# Patient Record
Sex: Male | Born: 1955 | Race: Asian | Hispanic: No | Marital: Married | Smoking: Former smoker
Health system: Southern US, Community
[De-identification: ages and names within clinical notes are randomized; demographics above are authoritative.]

## PROBLEM LIST (undated history)

## (undated) MED FILL — Dexamethasone Sodium Phosphate Inj 100 MG/10ML: INTRAMUSCULAR | Qty: 1 | Status: AC

---

## 2004-10-16 ENCOUNTER — Emergency Department (HOSPITAL_COMMUNITY): Admission: EM | Admit: 2004-10-16 | Discharge: 2004-10-16 | Payer: Self-pay | Admitting: *Deleted

## 2018-04-28 ENCOUNTER — Ambulatory Visit: Payer: BLUE CROSS/BLUE SHIELD | Admitting: Urgent Care

## 2018-04-28 ENCOUNTER — Encounter: Payer: Self-pay | Admitting: Urgent Care

## 2018-04-28 VITALS — BP 143/80 | HR 70 | Temp 97.8°F | Resp 16 | Ht 62.0 in | Wt 136.6 lb

## 2018-04-28 DIAGNOSIS — Z130 Encounter for screening for diseases of the blood and blood-forming organs and certain disorders involving the immune mechanism: Secondary | ICD-10-CM | POA: Diagnosis not present

## 2018-04-28 DIAGNOSIS — Z23 Encounter for immunization: Secondary | ICD-10-CM

## 2018-04-28 DIAGNOSIS — Z0000 Encounter for general adult medical examination without abnormal findings: Secondary | ICD-10-CM | POA: Diagnosis not present

## 2018-04-28 DIAGNOSIS — Z1321 Encounter for screening for nutritional disorder: Secondary | ICD-10-CM

## 2018-04-28 DIAGNOSIS — Z114 Encounter for screening for human immunodeficiency virus [HIV]: Secondary | ICD-10-CM

## 2018-04-28 DIAGNOSIS — Z13228 Encounter for screening for other metabolic disorders: Secondary | ICD-10-CM

## 2018-04-28 DIAGNOSIS — Z122 Encounter for screening for malignant neoplasm of respiratory organs: Secondary | ICD-10-CM

## 2018-04-28 DIAGNOSIS — Z1329 Encounter for screening for other suspected endocrine disorder: Secondary | ICD-10-CM

## 2018-04-28 DIAGNOSIS — F17210 Nicotine dependence, cigarettes, uncomplicated: Secondary | ICD-10-CM

## 2018-04-28 DIAGNOSIS — Z1211 Encounter for screening for malignant neoplasm of colon: Secondary | ICD-10-CM

## 2018-04-28 DIAGNOSIS — H538 Other visual disturbances: Secondary | ICD-10-CM

## 2018-04-28 MED ORDER — NICOTINE 21 MG/24HR TD PT24: 21.0000 mg | MEDICATED_PATCH | Freq: Every day | TRANSDERMAL | 0 refills | Status: DC

## 2018-04-28 NOTE — Patient Instructions (Addendum)
Set a quit date to stop smoking. On that date, you stop smoking cigarettes and start using nicotine patches. Change the patches once daily. If you are able to, start cutting back on your smoking before your quit date then do so but for sure quit smoking cigarettes on your quit date.    Health Maintenance, Male A healthy lifestyle and preventive care is important for your health and wellness. Ask your health care provider about what schedule of regular examinations is right for you. What should I know about weight and diet? Eat a Healthy Diet  Eat plenty of vegetables, fruits, whole grains, low-fat dairy products, and lean protein.  Do not eat a lot of foods high in solid fats, added sugars, or salt.  Maintain a Healthy Weight Regular exercise can help you achieve or maintain a healthy weight. You should:  Do at least 150 minutes of exercise each week. The exercise should increase your heart rate and make you sweat (moderate-intensity exercise).  Do strength-training exercises at least twice a week.  Watch Your Levels of Cholesterol and Blood Lipids  Have your blood tested for lipids and cholesterol every 5 years starting at 64 years of age. If you are at high risk for heart disease, you should start having your blood tested when you are 64 years old. You may need to have your cholesterol levels checked more often if: ? Your lipid or cholesterol levels are high. ? You are older than 64 years of age. ? You are at high risk for heart disease.  What should I know about cancer screening? Many types of cancers can be detected early and may often be prevented. Lung Cancer  You should be screened every year for lung cancer if: ? You are a current smoker who has smoked for at least 30 years. ? You are a former smoker who has quit within the past 15 years.  Talk to your health care provider about your screening options, when you should start screening, and how often you should be  screened.  Colorectal Cancer  Routine colorectal cancer screening usually begins at 64 years of age and should be repeated every 5-10 years until you are 64 years old. You may need to be screened more often if early forms of precancerous polyps or small growths are found. Your health care provider may recommend screening at an earlier age if you have risk factors for colon cancer.  Your health care provider may recommend using home test kits to check for hidden blood in the stool.  A small camera at the end of a tube can be used to examine your colon (sigmoidoscopy or colonoscopy). This checks for the earliest forms of colorectal cancer.  Prostate and Testicular Cancer  Depending on your age and overall health, your health care provider may do certain tests to screen for prostate and testicular cancer.  Talk to your health care provider about any symptoms or concerns you have about testicular or prostate cancer.  Skin Cancer  Check your skin from head to toe regularly.  Tell your health care provider about any new moles or changes in moles, especially if: ? There is a change in a mole's size, shape, or color. ? You have a mole that is larger than a pencil eraser.  Always use sunscreen. Apply sunscreen liberally and repeat throughout the day.  Protect yourself by wearing long sleeves, pants, a wide-brimmed hat, and sunglasses when outside.  What should I know about heart disease, diabetes,  and high blood pressure?  If you are 1-69 years of age, have your blood pressure checked every 3-5 years. If you are 26 years of age or older, have your blood pressure checked every year. You should have your blood pressure measured twice-once when you are at a hospital or clinic, and once when you are not at a hospital or clinic. Record the average of the two measurements. To check your blood pressure when you are not at a hospital or clinic, you can use: ? An automated blood pressure machine at a  pharmacy. ? A home blood pressure monitor.  Talk to your health care provider about your target blood pressure.  If you are between 40-44 years old, ask your health care provider if you should take aspirin to prevent heart disease.  Have regular diabetes screenings by checking your fasting blood sugar level. ? If you are at a normal weight and have a low risk for diabetes, have this test once every three years after the age of 36. ? If you are overweight and have a high risk for diabetes, consider being tested at a younger age or more often.  A one-time screening for abdominal aortic aneurysm (AAA) by ultrasound is recommended for men aged 94-75 years who are current or former smokers. What should I know about preventing infection? Hepatitis B If you have a higher risk for hepatitis B, you should be screened for this virus. Talk with your health care provider to find out if you are at risk for hepatitis B infection. Hepatitis C Blood testing is recommended for:  Everyone born from [birth year] through 1967.  Anyone with known risk factors for hepatitis C.  Sexually Transmitted Diseases (STDs)  You should be screened each year for STDs including gonorrhea and chlamydia if: ? You are sexually active and are younger than 64 years of age. ? You are older than 64 years of age and your health care provider tells you that you are at risk for this type of infection. ? Your sexual activity has changed since you were last screened and you are at an increased risk for chlamydia or gonorrhea. Ask your health care provider if you are at risk.  Talk with your health care provider about whether you are at high risk of being infected with HIV. Your health care provider may recommend a prescription medicine to help prevent HIV infection.  What else can I do?  Schedule regular health, dental, and eye exams.  Stay current with your vaccines (immunizations).  Do not use any tobacco products, such as  cigarettes, chewing tobacco, and e-cigarettes. If you need help quitting, ask your health care provider.  Limit alcohol intake to no more than 2 drinks per day. One drink equals 12 ounces of beer, 5 ounces of wine, or 1 ounces of hard liquor.  Do not use street drugs.  Do not share needles.  Ask your health care provider for help if you need support or information about quitting drugs.  Tell your health care provider if you often feel depressed.  Tell your health care provider if you have ever been abused or do not feel safe at home. This information is not intended to replace advice given to you by your health care provider. Make sure you discuss any questions you have with your health care provider. Document Released: 03/25/2008 Document Revised: 05/26/2016 Document Reviewed: 07/01/2015 Elsevier Interactive Patient Education  2018 [place] soi ??i trng, Ng??i l?n  Colonoscopy, Adult N?i soi ??i trng l th?m khm ?? ki?m tra ton b? ru?t gi. Trong qu trnh th?m khm, m?t ?ng ???c bi tr?n, c th? u?n cong ???c ??a vo trong h?u mn v sau ? vo tr?c trng, ??i trng v cc ph?n khc c?a ru?t gi. N?i soi ??i trng th??ng ???c th?c hi?n nh? m?t ph?n c?a khm sng l?c ??i-tr?c k?t trng thng th??ng ho?c ?? ?ng ph v?i m?t s? tri?u ch?ng nh?t ??nh, ch?ng h?n nh? b?nh thi?u mu, tiu ch?y dai d?ng, ?au b?ng v mu trong phn. Th?m khm ny c th? gip sng l?c v ch?n ?on cc v?n ?? b?nh l, bao g?m:  Kh?i u.  Polip.  Vim.  Cc vng ch?y mu.  Hy cho chuyn gia ch?m [place] s?c kh?e bi?t v?:  B?t k? v?n ?? d? ?ng no m qu v? c.  T?t c? cc lo?i thu?c m qu v? ?ang s? d?ng, bao g?m c? vitamin, th?o d??c, thu?c nh? m?t, thu?c d?ng kem, thu?c khng k ??n.  B?t k? v?n ?? g m qu v? ho?c cc thnh vin trong gia ?nh ? g?p ph?i v?i thu?c gy m.  B?t k? r?i lo?n v? mu no m qu v? c.  B?t k? ph?u thu?t no qu v? ? ???c lm.  B?t k? tnh tr?ng b?nh  l no c?a qu v?.  B?t k? v?n ?? no m qu v? ? b? trong khi ??i ti?n. Cc nguy c? l g? Ni chung, ?y l m?t th? thu?t an ton. Tuy nhin, cc v?n ?? c th? x?y ra, bao g?m:  Ch?y mu.  V?t rch trong ru?t.  Ph?n ?ng v?i thu?c ???c cho s? d?ng trong lc th?m khm.  Nhi?m trng (hi?m g?p).  ?i?u g x?y ra tr??c khi lm th? thu?t? Nh?ng h?n ch? v? ?n v u?ng [place] th? ch? d?n c?a chuyn gia ch?m [place] s?c kh?e v? ?n v u?ng, c th? bao g?m:  M?t vi ngy tr??c khi ti?n hnh th? thu?t - tun theo ch? ?? ?n t ch?t x?. Trnh ?n qu? h?ch, cc lo?i h?t, tri cy s?y, tri cy s?ng v rau.  1-3 ngy tr??c ngy ti?n hnh th? thu?t - tun theo ch? ?? ?n ?? l?ng trong. Ch? u?ng cc ?? l?ng trong, ch?ng h?n nh? canh ho?c n??c canh th?t trong, tr ho?c c ph ?en, n??c p trong, n??c ng?t ho?c n??c u?ng th? thao trong, mn trng mi?ng ch?a gelatin v kem que. Trnh u?ng cc ch?t l?ng c ch?a ph?m mu ?? ho?c tm.  Vo ngy ti?n hnh th? thu?t - khng ?n hay u?ng b?t k? th? g trong vng 2 gi? tr??c khi ti?n hnh th? thu?t, ho?c trong kho?ng th?i gian m chuyn gia ch?m [place] s?c kh?e c?a qu v? ch? d?n.  Lm s?ch ru?t N?u qu v? ? ???c k ??n m?t lo?i thu?c x? qua ???ng u?ng ?? lm s?ch ??i trng:  Hy s?? du?ng theo ch? d?n c?a chuyn gia ch?m [place] s?c kh?e c?a qu v?. B?t ??u vo ngy tr??c khi lm th? thu?t, qu v? s? c?n u?ng m?t l??ng l?n d?ch l?ng pha thu?c. Ch?t l?ng ny s? lm qu v? ??i ti?n phn l?ng nhi?u l?n cho ??n khi phn g?n nh? trong ho?c c mu xanh l cy nh?t.  N?u da ho?c h?u mn c?a qu v? b? kch ?ng do tiu ch?y, qu v? c th? s? d?ng nh?ng th? sau ?? lm gi?m  kch ?ng: ? Kh?n lau t?m thu?c, ch?ng h?n nh? kh?n lau ??t c?a ng??i l?n c tinh ch?t l h?i v vitamin E. ? S?n ph?m lm d?u da nh? vaseline.  N?u qu v? b? nn trong khi u?ng thu?c x?, hy ngh? ng?i trong t?i ?a 60 pht v sau ? b?t ??u l?i vi?c lm s?ch ru?t. N?u qu v? ti?p t?c nn v khng th? u?ng thu?c x?  m khng b? nn, hy g?i cho chuyn gia ch?m [place] s?c kh?e c?a qu v?.  H??ng d?n chung  Hy h?i chuyn gia ch?m [place] s?c kh?e v? vi?c thay ??i ho?c d?ng cc lo?i thu?c dng th??ng xuyn c?a qu v?. ?i?u ny ??c bi?t quan tr?ng n?u qu v? ?ang dng thu?c tr? ti?u ???ng ho?c thu?c lm long mu.  C k? ho?ch nh? ai ? ??a quy? vi? t? b?nh vi?n ho?c t? phng khm v? nh. ?i?u g x?y ra trong qu trnh th?c hi?n th? thu?t?  Qu v? c th? ???c ??t m?t ???ng truy?n t?nh m?ch (IV) vo m?t trong cc t?nh m?ch.  Qu v? s? ???c cho dng thu?c ?? gip th? gin (thu?c an th?n).  ?? gi?m nguy c? nhi?m trng: ? ??i ng? nhn vin y t? s? r?a ho?c st trng tay c?a h?. ? Vng h?u mn c?a qu v? s? ???c r?a b?ng x phng.  Qu v? s? ???c yu c?u n?m nghing, hai ??u g?i g?p l?i.  Chuyn gia ch?m [place] s?c kh?e c?a qu v? s? bi tr?n m?t ?ng di, m?ng, m?m. ?ng s? ???c g?n camera v ?n ? ??u.  ?ng s? ???c ??a vo h?u mn c?a qu v?.  ?ng s? ???c nh? nhng ??a qua tr?c trng v ??i trng c?a qu v?.  Khng kh s? ???c b?m vo ??i trng c?a qu v? ?? gi? cho ??i trng m? r?ng. Qu v? c th? c?m th?y m?t cht p l?c ho?c co th?t.  Camera s? ???c s? d?ng ?? ch?p ?nh trong qu trnh ti?n hnh th? thu?t.  M?t m?u m nh? co? th? ????c l?y t?? c? th? c?a qu v? ?? ki?m tra d???i ki?nh hi?n vi (sinh thi?t). N?u pht hi?n th?y b?t k? v?n ?? ti?m tng no, m ny s? ???c g?i ??n phng th nghi?m ?? xt nghi?m.  N?u pht hi?n th?y cc polip nh?, chuyn gia ch?m [place] s?c kh?e c?a qu v? c th? l?y cc polip ? v mang ?i ki?m tra ?? xem c t? bo ung th? khng.  ?ng ??a vo h?u mn c?a qu v? s? ???c t? t? rt ra. Th? thu?t ny c th? khc nhau gi?a cc chuyn gia ch?m [place] s?c kh?e v cc b?nh vi?n. ?i?u g x?y ra sau khi lm th? thu?t?  Huy?t p, nh?p tim, nh?p th? v n?ng ?? oxi trong mu c?a qu v? s? ???c theo di cho ??n khi thu?c qu v? ? dng h?t tc d?ng.  Khng li xe trong vng 24 gi? sau khi  th?m khm.  Qu v? c th? c m?t l??ng mu nh? trong phn.  Qu v? c th? trung ti?n v b? co th?t ho?c ch??ng b?ng nh? do khng kh ? ???c s? d?ng ?? lm ph?ng ??i trng c?a qu v? trong lc th?m khm.  Qu v? ???c ty  l?y k?t qu? th? thu?t c?a mnh. Hy h?i chuyn gia ch?m [place] s?c kh?e ho?c khoa th?c hi?n thu? thu?t ?? bi?t khi no  c k?t qu? c?a qu v?. Thng tin ny khng nh?m m?c ?ch thay th? cho l?i khuyn m chuyn gia ch?m [place] s?c kh?e ni v?i qu v?. Hy b?o ??m qu v? ph?i th?o lu?n b?t k? v?n ?? g m qu v? c v?i chuyn gia ch?m [place] s?c kh?e c?a qu v?. Document Released: 07/07/2005 Document Revised: 09/09/2016 Document Reviewed: 12/09/2015 Elsevier Interactive Patient Education  2018 [employer].    Nicotine skin patches ?y l thu?c g? NICOTINE gip b? ht thu?c l. Cc mi?ng thu?c dn thay th? ch?t nicotine c trong thu?c l v gip gi?m cc ph?n ?ng cai thu?c (thi?u thu?c). Chng c tc d?ng nh?t khi ???c dng k?t h?p v?i m?t ch??ng trnh cai thu?c l. Thu?c ny c th? ???c dng cho nh?ng m?c ?ch khc; hy h?i ng??i cung c?p d?ch v? y t? ho?c d??c s? c?a mnh, n?u qu v? c th?c m?c. (CC) NHN HI?U PH? BI?N: Habitrol, Nicoderm CQ, Nicotrol Ti c?n ph?i bo cho ng??i cung c?p d?ch v? y t? c?a mnh ?i?u g tr??c khi dng thu?c ny? H? c?n bi?t li?u qu v? c b?t k? tnh tr?ng no sau ?y khng: -b?nh ti?u ???ng -b?nh tim, bao g?m ?au ng?c (ch?ng ?au th?t ng?c), suy tim, ho?c ? t?ng b? nh?i mu c? tim -nh?p tim khng ??u -huy?t a?p cao -b?nh ph?i, bao g?m b?nh hen suy?n -c??ng tuy?n gip tr?ng -u t? bo ?a chrome -co gi?t ho?c c ti?n s? co gi?t -b? cc v?n ?? v? da, nh? l chm da -cc v?n ?? v? bao t? ho?c lot bao t? -pha?n ??ng b?t th???ng ho??c di? ??ng v??i nicotine ho?c ch?t k?t dnh -pha?n ??ng b?t th???ng ho??c di? ??ng v??i ca?c d??c ph?m kha?c, th?c ph?m, thu?c nhu?m, ho??c ch?t ba?o qua?n -?ang c thai ho??c ??nh co? thai -?ang cho con  bu? Ti nn s? d?ng thu?c ny nh? th? no? Thu?c ny ?? dng trn da. Hy lm theo cc h??ng d?n trn h?p thu?c ho?c nhn thu?c. Tm vng da s?ch, kh, khng nh?n, khng b? t?n th??ng v khng c lng ? cnh tay trn, ng?c, ho?c l?ng. R?a tay b?ng x bng th??ng v n??c. Khng dng b?t k? th? g c ch?a l h?i, m? c?u (lanolin), ho?c glycerin, v nh?ng th? ny c th? lm cho mi?ng thu?c dn khng dnh. Lm kh hon ton. L?y mi?ng thu?c dn ra kh?i bao nim kn. Khng ???c c? g?ng c?t ho?c xn quanh mi?ng thu?c dn. Dng lng bn tay, ?n m?nh mi?ng thu?c dn vo ch? dn trong 10 giy ?? ch?c ch?n r?ng mi?ng thu?c ti?p xc t?t v?i da. Nn r?a tay sau khi dn mi?ng thu?c dn xong. Thay mi?ng thu?c dn hng ngy, duy tr m?t th?i bi?u ??u ??n. Khi dn mi?ng thu?c dn m?i, nn s? d?ng vng da ? ch? khc. Ch? t nh?t 1 tu?n tr??c khi dng l?i ch? dn c?. Hy bn v?i bc s? nhi khoa c?a qu v? v? vi?c dng thu?c ny ? tr? em. C th? c?n ch?m [place] ??c bi?t. Qu li?u: N?u qu v? cho r?ng mnh ? dng qu nhi?u thu?c ny, th hy lin l?c v?i trung tm ki?m sot ch?t ??c ho?c phng c?p c?u ngay l?p t?c. L?U : Thu?c ny ch? dnh ring cho qu v?. Khng chia s? thu?c ny v?i nh?ng ng??i khc. N?u ti l? qun m?t li?u th sao? N?u qu v? qun thay m?t mi?ng thu?c dn,  th hy dng n ngay khi c th?. M?i l?n ch? ???c dn m?t mi?ng thu?c dn v khng ???c ?? n l?u trn da lu h?n th?i gian ? ???c ch? d?n. N?u mi?ng thu?c dn b? r?t ra, th qu v? c th? thay n b?ng m?t mi?ng khc, nh?ng hy duy tr th?i bi?u dng thu?c c?a mnh v l?t b? mi?ng thu?c dn ?ng lc. Nh?ng g c th? t??ng tc v?i thu?c ny? -cc thu?c dng cho b?nh hen suy?n -cc thu?c dng ?? tr? huy?t p cao -cc thu?c dng cho ch?ng tr?m c?m [facility] sch ny c th? khng m t? ?? h?t cc t??ng tc c th? x?y ra. Hy ??a cho ng??i cung c?p d?ch v? y t? c?a mnh danh sch t?t c? cc thu?c, th?o d??c, cc thu?c khng c?n toa, ho?c cc ch? ph?m b? sung m  qu v? dng. C?ng nn bo cho h? bi?t r?ng qu v? c ht thu?c, u?ng r??u, ho?c c s? d?ng ma ty tri php hay khng. Vi th? c th? t??ng tc v?i thu?c c?a qu v?. Ti c?n ph?i theo di ?i?u g trong khi dng thu?c ny? Qu v? nn b?t ??u dng mi?ng dn nicotine vo ngy qu v? ng?ng ht thu?c l. N?u qu v? khng thnh cng trong n? l?c cai thu?c l v ht m?t ?i?u th c?ng khng sao. Qu v? v?n c th? ti?p t?c n? l?c cai thu?c l c?a mnh v ti?p t?c s? d?ng ch? ph?m nh? ? ???c ch? d?n. Ch? c?n v?t ?i cc ?i?u thu?c v tr? l?i v?i k? ho?ch cai thu?c l c?a mnh. Qu v? c th? gi? mi?ng thu?c dn t?i ch? trong khi b?i, khi t?m trong b?n ho?c t?m b?ng vi hoa sen. N?u mi?ng thu?c dn b? r?t ra trong cc ho?t ??ng ny, th hy thay mi?ng khc. Khi qu v? dn mi?ng thu?c l?n ??u tin, da qu v? c th? b? ng?a ho?c b? rt. V?n ?? ny s? s?m bi?n m?t. Khi qu v? l?t b? mi?ng thu?c, da c th? b? ??, nh?ng ?i?u ny ch? ko di vi ngy. Hy lin l?c v?i bc s? ho?c [place] vin y t? c?a mnh, n?u tnh tr?ng m?n ?? da khng h?t sau 4 ngy, ho?c n?u da b? s?ng, ho?c n?u qu v? b? n?i ban. N?u qu v? b? b?nh ti?u ???ng v qu v? b? ht thu?c l, th cc tc d?ng c?a insulin c th? t?ng ln v qu v? c th? s? c?n gi?m li?u dng c?a insulin. Hy lin l?c v?i bc s? ho?c chuyn vin y t? ?? bi?t cch ?i?u ch?nh li?u dng insulin c?a mnh. N?u qu v? s?p ch?p c?ng h??ng t? (magnetic resonance imaging - MRI), th hy bo cho k? thu?t vin MRI bi?t r?ng qu v? hi?n ?ang dn mi?ng thu?c ny trn ng??i. Ph?i l?t mi?ng thu?c tr??c khi ch?p cng h??ng t? (magnetic resonance imaging - MRI). Ti c th? nh?n th?y nh?ng tc d?ng ph? no khi dng thu?c ny? Nh?ng tc d?ng ph? qu v? c?n ph?i bo cho bc s? ho?c chuyn vin y t? cng s?m cng t?t: -cc ph?n ?ng d? ?ng, ch?ng h?n nh? da b? m?n ??, ng?a, n?i my ?ay, s?ng ? m?t, mi, ho?c l??i -kh th? -thay ??i thnh l?c -thay ??i th? l?c -?au ng?c -m? hi l?nh -l  l?n -tim ??p nhanh ho?c khng ??u -c?m th?y chong vng, ng?t x?u, b? t -?au ??u -t?ng  ti?t n??c mi?ng -tnh tr?ng ?? da ko di qu 4 ngy -?au b?ng -cc d?u hi?u v tri?u ch?ng b? qu li?u nicotine, nh? l b? bu?n i; i; chng m?t; y?u ?t; v c nh?p tim nhanh Cc tc d?ng ph? khng c?n ph?i ch?m [place] y t? (hy bo cho bc s? ho?c chuyn vin y t?, n?u cc tc d?ng ph? ny ti?p di?n ho?c gy phi?n toi): -tiu ch?y -kh mi?ng -n?c c?t -d? cu k?nh -c?ng th?ng ho?c b?n ch?n -kh ng? ho?c c nh?ng gi?c m? k? l? Danh sch ny c th? khng m t? ?? h?t cc tc d?ng ph? c th? x?y ra. Xin g?i t?i bc s? c?a mnh ?? ???c c? v?n chuyn mn v? cc tc d?ng ph?Sander Nephew v? c th? t??ng trnh cc tc d?ng ph? cho FDA theo s? [phone]. Ti nn c?t gi? thu?c c?a mnh ? ?u? ?? ngoi t?m tay tr? em. C?t gi? ? nhi?t ?? phng t? 20 ??n 25 ?? C (66 ??n 75 ?? F). Trnh nhi?t v nh sng. C?t gi? trong bao b c?a nh s?n xu?t cho ??n khi dng. V?t b? t?t c? thu?c ch?a dng sau ngy h?t h?n in trn nhn thu?c ho?c bao thu?c. Khi qu v? l?t mi?ng thu?c, hy g?p m?t dnh vo trong. B? vo trong ci bao khng v v?t b?. L?U : ?y l b?n tm t?t. N c th? khng bao hm t?t c? thng tin c th? c. N?u qu v? th?c m?c v? thu?c ny, xin trao ??i v?i bc s?, d??c s?, ho?c ng??i cung c?p d?ch v? y t? c?a mnh.  2018 Elsevier/Gold Standard (2016-10-28 00:00:00)      IF you received an x-ray today, you will receive an invoice from [facility]. Please contact [facility] at [phone] with questions or concerns regarding your invoice.   IF you received labwork today, you will receive an invoice from [place]. Please contact [facility] at [phone] with questions or concerns regarding your invoice.   Our billing staff will not be able to assist you with questions regarding bills from these companies.  You will be contacted with the lab results as soon as they are available. The fastest  way to get your results is to activate your My Chart account. Instructions are located on the last page of this paperwork. If you have not heard from [place] regarding the results in 2 weeks, please contact this office.

## 2018-04-28 NOTE — Progress Notes (Signed)
MRN: [id number]  Subjective:   [name] is a 62 y.o. male presenting for annual physical exam. Works as a Patent attorney. Patient is married, has 3 sons. Has good relationships at home, has a good support network. Has occasional drink of alcohol. Smokes 1ppd, has been smoking for 40+ years. Would like to discuss quitting smoking.    Medical care team includes: PCP: Patient, No Pcp Per Vision: Wears reading glasses occasionally. Has not had an eye exam since 2006. Dental: Has not gotten dental care recently due to very busy schedule.  Specialists: None. Health Maintenance: Needs to have tdap. Will be updated today. Schedule for colonoscopy. Start process of CT scan to screen for cancer given smoking history.   He is not currently taking any medications and has no known food or drug allergies.  Denies past medical and surgical history. Denies family history of cancer, diabetes, HTN, HL, heart disease, stroke, mental illness.   ROS  Objective:   Vitals: BP (!) 143/80   Pulse 70   Temp 97.8 F (36.6 C) (Oral)   Resp 16   Ht 5\' 2"  (1.575 m)   Wt 136 lb 9.6 oz (62 kg)   SpO2 97%   BMI 24.98 kg/m    Visual Acuity Screening   Right eye Left eye Both eyes  Without correction: 20/50-2 20/70-1 20/40-1  With correction:       Physical Exam  Constitutional: He is oriented to person, place, and time. He appears well-developed and well-nourished.  HENT:  TM's intact bilaterally, no effusions or erythema. Nasal turbinates pink and moist, nasal passages patent. No sinus tenderness. Oropharynx clear, mucous membranes moist, dentition in good repair.  Eyes: Pupils are equal, round, and reactive to light. Conjunctivae and EOM are normal. Right eye exhibits no discharge. Left eye exhibits no discharge. No scleral icterus.  Neck: Normal range of motion. Neck supple. No thyromegaly present.  Cardiovascular: Normal rate, regular rhythm and intact distal pulses. Exam reveals no  gallop and no friction rub.  No murmur heard. Pulmonary/Chest: No stridor. No respiratory distress. He has no wheezes. He has no rales.  Abdominal: Soft. Bowel sounds are normal. He exhibits no distension and no mass. There is no tenderness. There is no rebound and no guarding.  Musculoskeletal: Normal range of motion. He exhibits no edema or tenderness.  Lymphadenopathy:    He has no cervical adenopathy.  Neurological: He is alert and oriented to person, place, and time. He has normal reflexes. He displays normal reflexes. Coordination normal.  Skin: Skin is warm and dry. No rash noted. No erythema. No pallor.  Psychiatric: He has a normal mood and affect.   Assessment and Plan :   Annual physical exam - Plan: HIV antibody, CBC, Comprehensive metabolic panel, Lipid panel, TSH, Hepatitis C antibody  Screening for endocrine, nutritional, metabolic and immunity disorder  Screening for deficiency anemia  Screening for HIV (human immunodeficiency virus) - Plan: HIV antibody  Blurred vision - Plan: Ambulatory referral to Ophthalmology  Screen for colon cancer - Plan: Ambulatory referral to Gastroenterology  Encounter for screening for malignant neoplasm of respiratory organs - Plan: CANCELED: CT CHEST LUNG CA SCREEN LOW DOSE W/O CM  Smoking greater than 40 pack years - Plan: Ambulatory Referral for Lung Cancer Scre  Patient is medically stable, very pleasant.  Labs pending. Discussed healthy lifestyle, diet, exercise, preventative care, vaccinations, and addressed patient's concerns.  Referral to pulmonology for lung cancer screening and gastroenterology for  colon cancer screening is pending.  We discussed smoking cessation at length today, patient was agreeable to start nicotine patches.  Steps to quit smoking reviewed with patient.  He is to follow-up in 6 weeks for the same.  Jaynee Eagles, PA-C [facility] [phone] 04/28/2018  10:17 AM

## 2018-04-29 LAB — CBC
HEMATOCRIT: 48.3 % (ref 37.5–51.0)
HEMOGLOBIN: 16.5 g/dL (ref 13.0–17.7)
MCH: 31.5 pg (ref 26.6–33.0)
MCHC: 34.2 g/dL (ref 31.5–35.7)
MCV: 92 fL (ref 79–97)
Platelets: 224 10*3/uL (ref 150–450)
RBC: 5.24 x10E6/uL (ref 4.14–5.80)
RDW: 14.8 % (ref 12.3–15.4)
WBC: 6.4 10*3/uL (ref 3.4–10.8)

## 2018-04-29 LAB — COMPREHENSIVE METABOLIC PANEL
A/G RATIO: 1.8 (ref 1.2–2.2)
ALT: 39 IU/L (ref 0–44)
AST: 28 IU/L (ref 0–40)
Albumin: 4.4 g/dL (ref 3.6–4.8)
Alkaline Phosphatase: 134 IU/L — ABNORMAL HIGH (ref 39–117)
BUN/Creatinine Ratio: 16 (ref 10–24)
BUN: 13 mg/dL (ref 8–27)
Bilirubin Total: 0.2 mg/dL (ref 0.0–1.2)
CALCIUM: 8.9 mg/dL (ref 8.6–10.2)
CO2: 21 mmol/L (ref 20–29)
CREATININE: 0.79 mg/dL (ref 0.76–1.27)
Chloride: 106 mmol/L (ref 96–106)
GFR calc Af Amer: 110 mL/min/{1.73_m2} (ref >59)
GFR, EST NON AFRICAN AMERICAN: 96 mL/min/{1.73_m2} (ref >59)
GLOBULIN, TOTAL: 2.4 g/dL (ref 1.5–4.5)
Glucose: 95 mg/dL (ref 65–99)
POTASSIUM: 4.3 mmol/L (ref 3.5–5.2)
SODIUM: 145 mmol/L — AB (ref 134–144)
Total Protein: 6.8 g/dL (ref 6.0–8.5)

## 2018-04-29 LAB — LIPID PANEL
CHOL/HDL RATIO: 6.7 ratio — AB (ref 0.0–5.0)
Cholesterol, Total: 268 mg/dL — ABNORMAL HIGH (ref 100–199)
HDL: 40 mg/dL (ref >39)
LDL CALC: 188 mg/dL — AB (ref 0–99)
TRIGLYCERIDES: 200 mg/dL — AB (ref 0–149)
VLDL Cholesterol Cal: 40 mg/dL (ref 5–40)

## 2018-04-29 LAB — HEPATITIS C ANTIBODY: Hep C Virus Ab: <0.1 s/co ratio (ref 0.0–0.9)

## 2018-04-29 LAB — HIV ANTIBODY (ROUTINE TESTING W REFLEX): HIV SCREEN 4TH GENERATION: NONREACTIVE

## 2018-04-29 LAB — TSH: TSH: 1.85 u[IU]/mL (ref 0.450–4.500)

## 2018-05-02 ENCOUNTER — Encounter: Payer: Self-pay | Admitting: Urgent Care

## 2018-05-19 ENCOUNTER — Telehealth: Payer: Self-pay | Admitting: Urgent Care

## 2018-05-19 NOTE — Telephone Encounter (Signed)
Called pt to let them know we would need to reschedule their appt 06/13/18. Pt's number did not work, no DPR on file.

## 2018-06-07 ENCOUNTER — Encounter: Payer: Self-pay | Admitting: *Deleted

## 2018-06-13 ENCOUNTER — Ambulatory Visit: Payer: BLUE CROSS/BLUE SHIELD | Admitting: Urgent Care

## 2018-06-14 ENCOUNTER — Ambulatory Visit: Payer: BLUE CROSS/BLUE SHIELD | Admitting: Urgent Care

## 2018-07-14 ENCOUNTER — Encounter: Payer: Self-pay | Admitting: Urgent Care

## 2018-08-22 ENCOUNTER — Emergency Department (HOSPITAL_COMMUNITY)
Admission: EM | Admit: 2018-08-22 | Discharge: 2018-08-22 | Disposition: A | Discharge status: Home / Self Care | Payer: BLUE CROSS/BLUE SHIELD | Attending: Emergency Medicine | Admitting: Emergency Medicine

## 2018-08-22 ENCOUNTER — Encounter (HOSPITAL_COMMUNITY): Payer: Self-pay

## 2018-08-22 ENCOUNTER — Emergency Department (HOSPITAL_COMMUNITY): Payer: BLUE CROSS/BLUE SHIELD

## 2018-08-22 ENCOUNTER — Other Ambulatory Visit: Payer: Self-pay

## 2018-08-22 DIAGNOSIS — N200 Calculus of kidney: Secondary | ICD-10-CM | POA: Insufficient documentation

## 2018-08-22 DIAGNOSIS — R109 Unspecified abdominal pain: Secondary | ICD-10-CM | POA: Diagnosis present

## 2018-08-22 DIAGNOSIS — F17210 Nicotine dependence, cigarettes, uncomplicated: Secondary | ICD-10-CM | POA: Diagnosis not present

## 2018-08-22 LAB — BASIC METABOLIC PANEL
ANION GAP: 8 (ref 5–15)
BUN: 18 mg/dL (ref 8–23)
CALCIUM: 8.8 mg/dL — AB (ref 8.9–10.3)
CO2: 24 mmol/L (ref 22–32)
Chloride: 108 mmol/L (ref 98–111)
Creatinine, Ser: 0.94 mg/dL (ref 0.61–1.24)
Glucose, Bld: 102 mg/dL — ABNORMAL HIGH (ref 70–99)
Potassium: 3.8 mmol/L (ref 3.5–5.1)
Sodium: 140 mmol/L (ref 135–145)

## 2018-08-22 LAB — CBC
HCT: 48.4 % (ref 39.0–52.0)
Hemoglobin: 15.8 g/dL (ref 13.0–17.0)
MCH: 31.8 pg (ref 26.0–34.0)
MCHC: 32.6 g/dL (ref 30.0–36.0)
MCV: 97.4 fL (ref 80.0–100.0)
NRBC: 0 % (ref 0.0–0.2)
PLATELETS: 206 10*3/uL (ref 150–400)
RBC: 4.97 MIL/uL (ref 4.22–5.81)
RDW: 14.6 % (ref 11.5–15.5)
WBC: 11.3 10*3/uL — ABNORMAL HIGH (ref 4.0–10.5)

## 2018-08-22 LAB — URINALYSIS, ROUTINE W REFLEX MICROSCOPIC
Bilirubin Urine: NEGATIVE
Glucose, UA: NEGATIVE mg/dL
KETONES UR: NEGATIVE mg/dL
LEUKOCYTES UA: NEGATIVE
NITRITE: NEGATIVE
PROTEIN: NEGATIVE mg/dL
Specific Gravity, Urine: 1.003 — ABNORMAL LOW (ref 1.005–1.030)
pH: 6 (ref 5.0–8.0)

## 2018-08-22 MED ORDER — ONDANSETRON HCL 4 MG/2ML IJ SOLN
4.0000 mg | Freq: Once | INTRAMUSCULAR | Status: AC
  Administered 2018-08-22: 4 mg via INTRAVENOUS
  Filled 2018-08-22: qty 2

## 2018-08-22 MED ORDER — KETOROLAC TROMETHAMINE 15 MG/ML IJ SOLN
15.0000 mg | Freq: Once | INTRAMUSCULAR | Status: AC
  Administered 2018-08-22: 15 mg via INTRAVENOUS
  Filled 2018-08-22: qty 1

## 2018-08-22 MED ORDER — OXYCODONE-ACETAMINOPHEN 5-325 MG PO TABS: 1.0000 | ORAL_TABLET | ORAL | 0 refills | Status: DC | PRN

## 2018-08-22 MED ORDER — IBUPROFEN 600 MG PO TABS: 600.0000 mg | ORAL_TABLET | Freq: Four times a day (QID) | ORAL | 0 refills | Status: DC | PRN

## 2018-08-22 MED ORDER — SODIUM CHLORIDE 0.9 % IV BOLUS
1000.0000 mL | Freq: Once | INTRAVENOUS | Status: AC
  Administered 2018-08-22: 1000 mL via INTRAVENOUS

## 2018-08-22 MED ORDER — MORPHINE SULFATE (PF) 4 MG/ML IV SOLN
4.0000 mg | Freq: Once | INTRAVENOUS | Status: AC
  Administered 2018-08-22: 4 mg via INTRAVENOUS
  Filled 2018-08-22: qty 1

## 2018-08-22 MED ORDER — OXYCODONE-ACETAMINOPHEN 5-325 MG PO TABS
2.0000 | ORAL_TABLET | Freq: Once | ORAL | Status: AC
  Administered 2018-08-22: 2 via ORAL
  Filled 2018-08-22: qty 2

## 2018-08-22 MED ORDER — ONDANSETRON 4 MG PO TBDP: 4.0000 mg | ORAL_TABLET | Freq: Three times a day (TID) | ORAL | 0 refills | Status: DC | PRN

## 2018-08-22 NOTE — Discharge Instructions (Addendum)
It is VERY important to follow-up with Urology for your CT scans, even if your symptoms resolve

## 2018-08-22 NOTE — ED Notes (Signed)
Patient given water and crackers for PO Challenge, per RN

## 2018-08-22 NOTE — ED Notes (Signed)
MD at bedside. 

## 2018-08-22 NOTE — ED Notes (Addendum)
Patients son states patient is ready to go home. MD, Ellender Hose made aware.

## 2018-08-22 NOTE — ED Notes (Signed)
Patient given saltine crackers and water for PO challenge 

## 2018-08-22 NOTE — ED Triage Notes (Addendum)
Pt arrives POV from home. Pt reports hematuria and left flank pain for 3 days. Pt reports the pain extends into abd.

## 2018-08-22 NOTE — ED Provider Notes (Addendum)
[facility] DEPT Provider Note   CSN: [id number] Arrival date & time: 08/22/18  0845     History   Chief Complaint Chief Complaint  Patient presents with  . Hematuria  . Flank Pain    HPI Jason Lyons is a 62 y.o. male.  HPI   62 year old male here with left flank pain.  Patient states that 3 days ago, his pain began as acute onset of aching, gnawing, left flank pain.  Since then, he has had intermittent hematuria as well as colicky left flank pain.  The pain is worse with certain positions.  No alleviating factors.  It seems to come and go randomly.  Denies any dysuria.  No fever chills.  He had nausea at the onset of pain, but has not had any nausea or vomiting since then.  Is been able to eat and drink without difficulty.  Denies any history of kidney stones.  No diarrhea or change in bowel habits.  History reviewed. No pertinent past medical history.  There are no active problems to display for this patient.   History reviewed. No pertinent surgical history.      Home Medications    Prior to Admission medications   Medication Sig Start Date End Date Taking? Authorizing Provider  ibuprofen (ADVIL,MOTRIN) 600 MG tablet Take 1 tablet (600 mg total) by mouth every 6 (six) hours as needed for moderate pain. 08/22/18   Duffy Bruce, MD  nicotine (NICODERM CQ) 21 mg/24hr patch Place 1 patch (21 mg total) onto the skin daily. Patient not taking: Reported on 08/22/2018 04/28/18   Jaynee Eagles, PA-C  ondansetron (ZOFRAN ODT) 4 MG disintegrating tablet Take 1 tablet (4 mg total) by mouth every 8 (eight) hours as needed for nausea or vomiting. 08/22/18   Duffy Bruce, MD  oxyCODONE-acetaminophen (PERCOCET/ROXICET) 5-325 MG tablet Take 1-2 tablets by mouth every 4 (four) hours as needed for moderate pain or severe pain. 08/22/18   Duffy Bruce, MD    Family History History reviewed. No pertinent family history.  Social History Social History    Tobacco Use  . Smoking status: Current Every Day Smoker  . Smokeless tobacco: Never Used  Substance Use Topics  . Alcohol use: Yes    Comment: occ  . Drug use: Never     Allergies   Patient has no known allergies.   Review of Systems Review of Systems  Constitutional: Negative for chills, fatigue and fever.  HENT: Negative for congestion and rhinorrhea.   Eyes: Negative for visual disturbance.  Respiratory: Negative for cough, shortness of breath and wheezing.   Cardiovascular: Negative for chest pain and leg swelling.  Gastrointestinal: Positive for nausea. Negative for abdominal pain, diarrhea and vomiting.  Genitourinary: Positive for flank pain and hematuria. Negative for dysuria.  Musculoskeletal: Negative for neck pain and neck stiffness.  Skin: Negative for rash and wound.  Allergic/Immunologic: Negative for immunocompromised state.  Neurological: Negative for syncope, weakness and headaches.  All other systems reviewed and are negative.    Physical Exam Updated Vital Signs BP 124/72   Pulse (!) 55   Temp 98.3 F (36.8 C) (Oral)   Resp 16   Wt 63.5 kg   SpO2 99%   BMI 25.61 kg/m   Physical Exam  Constitutional: He is oriented to person, place, and time. He appears well-developed and well-nourished. No distress.  HENT:  Head: Normocephalic and atraumatic.  Eyes: Conjunctivae are normal.  Neck: Neck supple.  Cardiovascular: Normal rate, regular rhythm  and normal heart sounds. Exam reveals no friction rub.  No murmur heard. Pulmonary/Chest: Effort normal and breath sounds normal. No respiratory distress. He has no wheezes. He has no rales.  Abdominal: Soft. Bowel sounds are normal. He exhibits no distension. There is no rebound and no guarding.  Mild left flank and upper quadrant tenderness.  No rebound or guarding.  Musculoskeletal: He exhibits no edema.  Neurological: He is alert and oriented to person, place, and time. He exhibits normal muscle tone.    Skin: Skin is warm. Capillary refill takes less than 2 seconds.  Psychiatric: He has a normal mood and affect.  Nursing note and vitals reviewed.    ED Treatments / Results  Labs (all labs ordered are listed, but only abnormal results are displayed) Labs Reviewed  URINALYSIS, ROUTINE W REFLEX MICROSCOPIC - Abnormal; Notable for the following components:      Result Value   Specific Gravity, Urine 1.003 (*)    Hgb urine dipstick LARGE (*)    Bacteria, UA FEW (*)    All other components within normal limits  BASIC METABOLIC PANEL - Abnormal; Notable for the following components:   Glucose, Bld 102 (*)    Calcium 8.8 (*)    All other components within normal limits  CBC - Abnormal; Notable for the following components:   WBC 11.3 (*)    All other components within normal limits    EKG None  Radiology Ct Renal Stone Study  Result Date: 08/22/2018 CLINICAL DATA:  62 year old male with left flank pain for 3 days. Hematuria. Initial encounter. EXAM: CT ABDOMEN AND PELVIS WITHOUT CONTRAST TECHNIQUE: Multidetector CT imaging of the abdomen and pelvis was performed following the standard protocol without IV contrast. COMPARISON:  None. FINDINGS: Lower chest: Dependent atelectasis. Focal fatty infiltration apex left ventricle may be related to prior infarct. Heart slightly enlarged. Minimal right coronary artery calcification. Hepatobiliary: Abnormal appearance inferior aspect right lobe liver raises possibility of underlying mass. Gallbladder sludge without calcified gallstone. Pancreas: Taking into account limitation by non contrast imaging, no worrisome pancreatic mass or inflammation. Spleen: Taking into account limitation by non contrast imaging, no splenic mass or enlargement. Adrenals/Urinary Tract: 3 x 3 x 8 mm obstructing stone at the level left ureterovesical junction is causing moderate left hydroureteronephrosis. At the level of the left ureteral vesical junction, there is soft  tissue prominence which in the present setting may represent edema related to the obstructing stone. Primary bladder mass is a secondary consideration. Slight haziness of left pararenal fat planes probably related to the obstruction rather than pyelonephritis. Tiny left lower pole nonobstructing renal calculi. Taking into account limitation by non contrast imaging, no worrisome renal or adrenal lesion. Stomach/Bowel: No extraluminal bowel inflammatory process. Specifically, no inflammation surrounds the appendix. Portions of the stomach, small bowel and colon are under distended limiting evaluation. Vascular/Lymphatic: Atherosclerotic changes aorta iliac arteries and femoral arteries. No abdominal aortic aneurysm. Scattered normal size lymph nodes. Reproductive: Slightly lobulated prostate gland with prominent calcifications on the left. Minimal impression upon the bladder base. Other: No free air or bowel containing hernia. Musculoskeletal: Minimal sclerotic focus right femoral neck and ilium bilaterally. IMPRESSION: 1. 3 x 3 x 8 mm obstructing stone at the level left ureterovesical junction is causing moderate left hydroureteronephrosis. At the level of the left ureteral vesical junction, there is soft tissue prominence which in the present setting may represent edema related to the obstructing stone. Primary bladder mass is a secondary consideration. 2. Slight haziness  of left pararenal fat planes probably related to the obstruction rather than pyelonephritis. Tiny nonobstructing left lower pole renal calculus. 3. Hypodensity inferior aspect right lobe liver. Lack of contrast and motion limited evaluation. Cannot exclude a liver mass. Dedicated liver imaging, whether by CT or MR may be considered for further delineation. 4. Focal fatty infiltration apex left ventricle may be related to prior infarct. Heart slightly enlarged. Minimal right coronary artery calcification. 5.  Aortic Atherosclerosis (ICD10-I70.0). 6.  Slightly lobulated prostate gland with prominent calcifications on the left. Minimal impression upon the bladder base. Electronically Signed   By: Genia Del M.D.   On: 08/22/2018 10:25    Procedures Procedures (including critical care time)  Medications Ordered in ED Medications  sodium chloride 0.9 % bolus 1,000 mL (0 mLs Intravenous Stopped 08/22/18 1101)  morphine 4 MG/ML injection 4 mg (4 mg Intravenous Given 08/22/18 1001)  ondansetron (ZOFRAN) injection 4 mg (4 mg Intravenous Given 08/22/18 1001)  ketorolac (TORADOL) 15 MG/ML injection 15 mg (15 mg Intravenous Given 08/22/18 1123)  oxyCODONE-acetaminophen (PERCOCET/ROXICET) 5-325 MG per tablet 2 tablet (2 tablets Oral Given 08/22/18 1301)  ondansetron (ZOFRAN) injection 4 mg (4 mg Intravenous Given 08/22/18 1301)     Initial Impression / Assessment and Plan / ED Course  I have reviewed the triage vital signs and the nursing notes.  Pertinent labs & imaging results that were available during my care of the patient were reviewed by me and considered in my medical decision making (see chart for details).     61 yo F with PMHx here with L flank pain and hematuria. Labs, imaging is c/w 3x3x8 mm obstructing stone. No evidence of infection on UA and pt denies any fever. He is not tachycardic. Doubt infected stone. Mild leukocytosis is likely reactive. Renal function wnl. Pain markedly improved with fluids, analgesia here. Of note, pt has possible bladder mass, also findings in liver, heart on CT. These are likely incidental but were discussed with pt, w/ interpreter, and he will f/u with PCP. Importance of close f/u with Urology discussed. Given that stone is already at UVJ, feel risks of flomax with BP 110-120s here outweigh benefits. D/c with good return precautions.  Final Clinical Impressions(s) / ED Diagnoses   Final diagnoses:  Renal stone    ED Discharge Orders         Ordered    oxyCODONE-acetaminophen (PERCOCET/ROXICET)  5-325 MG tablet  Every 4 hours PRN     08/22/18 1336    ondansetron (ZOFRAN ODT) 4 MG disintegrating tablet  Every 8 hours PRN     08/22/18 1336    ibuprofen (ADVIL,MOTRIN) 600 MG tablet  Every 6 hours PRN     08/22/18 1336           Duffy Bruce, MD 08/22/18 1547    Duffy Bruce, MD 08/22/18 1549

## 2021-05-11 DIAGNOSIS — C7951 Secondary malignant neoplasm of bone: Secondary | ICD-10-CM | POA: Insufficient documentation

## 2021-05-11 DIAGNOSIS — R7989 Other specified abnormal findings of blood chemistry: Secondary | ICD-10-CM | POA: Insufficient documentation

## 2021-05-11 DIAGNOSIS — Z86711 Personal history of pulmonary embolism: Secondary | ICD-10-CM | POA: Insufficient documentation

## 2021-05-11 DIAGNOSIS — K769 Liver disease, unspecified: Secondary | ICD-10-CM | POA: Insufficient documentation

## 2021-05-11 DIAGNOSIS — Z79899 Other long term (current) drug therapy: Secondary | ICD-10-CM | POA: Insufficient documentation

## 2021-05-11 DIAGNOSIS — C249 Malignant neoplasm of biliary tract, unspecified: Secondary | ICD-10-CM | POA: Insufficient documentation

## 2021-05-11 DIAGNOSIS — Z5111 Encounter for antineoplastic chemotherapy: Secondary | ICD-10-CM | POA: Insufficient documentation

## 2021-05-11 DIAGNOSIS — Z7952 Long term (current) use of systemic steroids: Secondary | ICD-10-CM | POA: Insufficient documentation

## 2021-05-11 DIAGNOSIS — Z5112 Encounter for antineoplastic immunotherapy: Secondary | ICD-10-CM | POA: Insufficient documentation

## 2021-05-11 DIAGNOSIS — Z7901 Long term (current) use of anticoagulants: Secondary | ICD-10-CM | POA: Insufficient documentation

## 2021-05-11 DIAGNOSIS — Z510 Encounter for antineoplastic radiation therapy: Secondary | ICD-10-CM | POA: Insufficient documentation

## 2021-05-11 DIAGNOSIS — M8458XA Pathological fracture in neoplastic disease, other specified site, initial encounter for fracture: Secondary | ICD-10-CM | POA: Insufficient documentation

## 2021-05-11 DIAGNOSIS — Z87891 Personal history of nicotine dependence: Secondary | ICD-10-CM | POA: Insufficient documentation

## 2021-05-11 DIAGNOSIS — I700 Atherosclerosis of aorta: Secondary | ICD-10-CM | POA: Insufficient documentation

## 2021-05-11 DIAGNOSIS — I2693 Single subsegmental pulmonary embolism without acute cor pulmonale: Secondary | ICD-10-CM | POA: Insufficient documentation

## 2021-05-11 DIAGNOSIS — N400 Enlarged prostate without lower urinary tract symptoms: Secondary | ICD-10-CM | POA: Insufficient documentation

## 2021-05-13 ENCOUNTER — Emergency Department (HOSPITAL_BASED_OUTPATIENT_CLINIC_OR_DEPARTMENT_OTHER): Payer: Managed Care, Other (non HMO)

## 2021-05-13 ENCOUNTER — Other Ambulatory Visit: Payer: Self-pay

## 2021-05-13 ENCOUNTER — Encounter (HOSPITAL_BASED_OUTPATIENT_CLINIC_OR_DEPARTMENT_OTHER): Payer: Self-pay | Admitting: Obstetrics and Gynecology

## 2021-05-13 ENCOUNTER — Inpatient Hospital Stay (HOSPITAL_BASED_OUTPATIENT_CLINIC_OR_DEPARTMENT_OTHER)
Admission: EM | Admit: 2021-05-13 | Discharge: 2021-05-19 | DRG: 423 | Disposition: A | Discharge status: Home / Self Care | Payer: Managed Care, Other (non HMO) | Attending: Student | Admitting: Student

## 2021-05-13 ENCOUNTER — Emergency Department (HOSPITAL_BASED_OUTPATIENT_CLINIC_OR_DEPARTMENT_OTHER): Payer: Managed Care, Other (non HMO) | Admitting: Radiology

## 2021-05-13 DIAGNOSIS — I2699 Other pulmonary embolism without acute cor pulmonale: Secondary | ICD-10-CM | POA: Diagnosis present

## 2021-05-13 DIAGNOSIS — C7951 Secondary malignant neoplasm of bone: Secondary | ICD-10-CM | POA: Diagnosis present

## 2021-05-13 DIAGNOSIS — R160 Hepatomegaly, not elsewhere classified: Secondary | ICD-10-CM | POA: Diagnosis not present

## 2021-05-13 DIAGNOSIS — M8458XA Pathological fracture in neoplastic disease, other specified site, initial encounter for fracture: Secondary | ICD-10-CM | POA: Diagnosis not present

## 2021-05-13 DIAGNOSIS — M8448XA Pathological fracture, other site, initial encounter for fracture: Secondary | ICD-10-CM | POA: Diagnosis present

## 2021-05-13 DIAGNOSIS — Z20822 Contact with and (suspected) exposure to covid-19: Secondary | ICD-10-CM | POA: Diagnosis present

## 2021-05-13 DIAGNOSIS — R937 Abnormal findings on diagnostic imaging of other parts of musculoskeletal system: Secondary | ICD-10-CM | POA: Diagnosis not present

## 2021-05-13 DIAGNOSIS — F17210 Nicotine dependence, cigarettes, uncomplicated: Secondary | ICD-10-CM | POA: Diagnosis present

## 2021-05-13 DIAGNOSIS — R7989 Other specified abnormal findings of blood chemistry: Secondary | ICD-10-CM | POA: Diagnosis present

## 2021-05-13 DIAGNOSIS — K5909 Other constipation: Secondary | ICD-10-CM | POA: Diagnosis present

## 2021-05-13 DIAGNOSIS — M48061 Spinal stenosis, lumbar region without neurogenic claudication: Secondary | ICD-10-CM | POA: Diagnosis present

## 2021-05-13 DIAGNOSIS — C787 Secondary malignant neoplasm of liver and intrahepatic bile duct: Secondary | ICD-10-CM | POA: Diagnosis not present

## 2021-05-13 DIAGNOSIS — C801 Malignant (primary) neoplasm, unspecified: Secondary | ICD-10-CM | POA: Diagnosis present

## 2021-05-13 DIAGNOSIS — G893 Neoplasm related pain (acute) (chronic): Secondary | ICD-10-CM | POA: Diagnosis present

## 2021-05-13 DIAGNOSIS — K5900 Constipation, unspecified: Secondary | ICD-10-CM | POA: Diagnosis present

## 2021-05-13 DIAGNOSIS — F17200 Nicotine dependence, unspecified, uncomplicated: Secondary | ICD-10-CM | POA: Diagnosis not present

## 2021-05-13 DIAGNOSIS — Z789 Other specified health status: Secondary | ICD-10-CM | POA: Diagnosis not present

## 2021-05-13 DIAGNOSIS — R079 Chest pain, unspecified: Secondary | ICD-10-CM | POA: Diagnosis not present

## 2021-05-13 DIAGNOSIS — I2693 Single subsegmental pulmonary embolism without acute cor pulmonale: Secondary | ICD-10-CM | POA: Diagnosis present

## 2021-05-13 LAB — CBC
HCT: 46.3 % (ref 39.0–52.0)
Hemoglobin: 15.8 g/dL (ref 13.0–17.0)
MCH: 31.3 pg (ref 26.0–34.0)
MCHC: 34.1 g/dL (ref 30.0–36.0)
MCV: 91.9 fL (ref 80.0–100.0)
Platelets: 223 10*3/uL (ref 150–400)
RBC: 5.04 MIL/uL (ref 4.22–5.81)
RDW: 15 % (ref 11.5–15.5)
WBC: 6.4 10*3/uL (ref 4.0–10.5)
nRBC: 0 % (ref 0.0–0.2)

## 2021-05-13 LAB — BASIC METABOLIC PANEL
Anion gap: 10 (ref 5–15)
BUN: 14 mg/dL (ref 8–23)
CO2: 25 mmol/L (ref 22–32)
Calcium: 9.2 mg/dL (ref 8.9–10.3)
Chloride: 107 mmol/L (ref 98–111)
Creatinine, Ser: 0.66 mg/dL (ref 0.61–1.24)
GFR, Estimated: >60 mL/min (ref >60)
Glucose, Bld: 90 mg/dL (ref 70–99)
Potassium: 4.2 mmol/L (ref 3.5–5.1)
Sodium: 142 mmol/L (ref 135–145)

## 2021-05-13 LAB — RESP PANEL BY RT-PCR (FLU A&B, COVID) ARPGX2
Influenza A by PCR: NEGATIVE
Influenza B by PCR: NEGATIVE
SARS Coronavirus 2 by RT PCR: NEGATIVE

## 2021-05-13 LAB — HEPATIC FUNCTION PANEL
ALT: 85 U/L — ABNORMAL HIGH (ref 0–44)
AST: 61 U/L — ABNORMAL HIGH (ref 15–41)
Albumin: 4.1 g/dL (ref 3.5–5.0)
Alkaline Phosphatase: 578 U/L — ABNORMAL HIGH (ref 38–126)
Bilirubin, Direct: 0.1 mg/dL (ref 0.0–0.2)
Indirect Bilirubin: 0.8 mg/dL (ref 0.3–0.9)
Total Bilirubin: 0.9 mg/dL (ref 0.3–1.2)
Total Protein: 7.1 g/dL (ref 6.5–8.1)

## 2021-05-13 LAB — D-DIMER, QUANTITATIVE: D-Dimer, Quant: 1.13 ug/mL-FEU — ABNORMAL HIGH (ref 0.00–0.50)

## 2021-05-13 LAB — HEPARIN LEVEL (UNFRACTIONATED): Heparin Unfractionated: 0.32 IU/mL (ref 0.30–0.70)

## 2021-05-13 LAB — TROPONIN I (HIGH SENSITIVITY)
Troponin I (High Sensitivity): 6 ng/L (ref <18)
Troponin I (High Sensitivity): 6 ng/L (ref <18)

## 2021-05-13 MED ORDER — HEPARIN BOLUS VIA INFUSION
3600.0000 [IU] | Freq: Once | INTRAVENOUS | Status: AC
  Administered 2021-05-13: 3600 [IU] via INTRAVENOUS

## 2021-05-13 MED ORDER — MORPHINE SULFATE (PF) 4 MG/ML IV SOLN
4.0000 mg | INTRAVENOUS | Status: AC | PRN
  Administered 2021-05-13 – 2021-05-14 (×3): 4 mg via INTRAVENOUS
  Filled 2021-05-13 (×3): qty 1

## 2021-05-13 MED ORDER — IOHEXOL 350 MG/ML SOLN
60.0000 mL | Freq: Once | INTRAVENOUS | Status: AC | PRN
  Administered 2021-05-13: 60 mL via INTRAVENOUS

## 2021-05-13 MED ORDER — MORPHINE SULFATE (PF) 4 MG/ML IV SOLN
4.0000 mg | Freq: Once | INTRAVENOUS | Status: AC
  Administered 2021-05-13: 4 mg via INTRAVENOUS
  Filled 2021-05-13: qty 1

## 2021-05-13 MED ORDER — HEPARIN (PORCINE) 25000 UT/250ML-% IV SOLN
1100.0000 [IU]/h | INTRAVENOUS | Status: AC
  Administered 2021-05-13 – 2021-05-14 (×3): 1000 [IU]/h via INTRAVENOUS
  Filled 2021-05-13 (×2): qty 250

## 2021-05-13 NOTE — ED Triage Notes (Signed)
Patient reports to the ER for back pain. Patient reports it radiates to his chest. Patient states he has been having this for a month. Patient reports he has also had headaches. Denies N/V/D. Patient reports no shortness of breath but when he tries to take a deep breath he has chest pain.

## 2021-05-13 NOTE — Progress Notes (Signed)
ANTICOAGULATION CONSULT NOTE  Pharmacy Consult for heparin Indication: pulmonary embolus  No Known Allergies  Patient Measurements: Height: '5\' 2"'$  (157.5 cm) Weight: 61.2 kg (135 lb) IBW/kg (Calculated) : 54.6 Heparin Dosing Weight: 61.2kg  Vital Signs: Temp: 98.7 F (37.1 C) (08/03 2147) Temp Source: Oral (08/03 2147) BP: 170/81 (08/03 2147) Pulse Rate: 48 (08/03 2147)  Labs: Recent Labs    05/13/21 1128 05/13/21 1500 05/13/21 2207  HGB 15.8  --   --   HCT 46.3  --   --   PLT 223  --   --   HEPARINUNFRC  --   --  0.32  CREATININE 0.66  --   --   TROPONINIHS 6 6  --      Estimated Creatinine Clearance: 70.1 mL/min (by C-G formula based on SCr of 0.66 mg/dL).   Medical History: History reviewed. No pertinent past medical history.  Medications:  Infusions:   heparin 1,000 Units/hr (05/13/21 1510)    Assessment: 20 yom presented to the ED with back pain radiating to the chest. Found to have a PE and now starting IV heparin. Baseline CBC is WNL and patient is not on anticoagulation PTA.   05/13/2021: Initial heparin level 0.32-therapeutic on heparin infusion at 1000 units/hr No bleeding or infusion related concerns per RN  Goal of Therapy:  Heparin level 0.3-0.7 units/ml Monitor platelets by anticoagulation protocol: Yes   Plan:  Continue heparin infusion at 1000 units/hr Repeat a 6 hr heparin level Daily heparin level and CBC while on heparin F/U long-term anticoagulation plan  Netta Cedars PharmD 05/13/2021,11:08 PM

## 2021-05-13 NOTE — Progress Notes (Signed)
ANTICOAGULATION CONSULT NOTE - Initial Consult  Pharmacy Consult for heparin Indication: pulmonary embolus  No Known Allergies  Patient Measurements: Height: '5\' 2"'$  (157.5 cm) Weight: 61.2 kg (135 lb) IBW/kg (Calculated) : 54.6 Heparin Dosing Weight: 61.2kg  Vital Signs: Temp: 97.9 F (36.6 C) (08/03 1251) Temp Source: Oral (08/03 1251) BP: 122/75 (08/03 1400) Pulse Rate: 59 (08/03 1400)  Labs: Recent Labs    05/13/21 1128  HGB 15.8  HCT 46.3  PLT 223  CREATININE 0.66  TROPONINIHS 6    Estimated Creatinine Clearance: 70.1 mL/min (by C-G formula based on SCr of 0.66 mg/dL).   Medical History: History reviewed. No pertinent past medical history.  Medications:  Infusions:   heparin      Assessment: 21 yom presented to the ED with back pain radiating to the chest. Found to have a PE and now starting IV heparin. Baseline CBC is WNL and patient is not on anticoagulation PTA.   Goal of Therapy:  Heparin level 0.3-0.7 units/ml Monitor platelets by anticoagulation protocol: Yes   Plan:  Heparin bolus 3600 units IV x 1 Heparin gtt 1000 units/hr Check a 6 hr heparin level Daily heparin level and CBC  Elzora Cullins, Rande Lawman 05/13/2021,2:47 PM

## 2021-05-13 NOTE — ED Notes (Signed)
Called [facility] to transport patient to [facility]

## 2021-05-13 NOTE — ED Provider Notes (Addendum)
[facility] DEPT Provider Note   CSN: [id number] Arrival date & time: 05/13/21  1101     History Chief Complaint  Patient presents with   Back Pain    Jason Lyons is a 65 y.o. male.  HPI     65 year old male who presents with concern for chest and back pain.  Chest pain going to the back, making it difficult to sleep. Started one month ago.  Feels like a dull pain and stabbing pain. Comes and goes but is worse at night. When laying down the pain will worsen, improves with sitting and walking. Worse with deep breaths. Left sided chest pain with radiation to left side of back.  No dyspnea, nausea, vomiting, sweating. No hx of pain like this.  Pain is worse with movement of arms, lifts 50lb at work. No weakness. At first describes numbness but then clarifies it is pain not numb.  Smoking cigarettes, occ etoh, no other drugs No medical problems.  No leg pain or swelling, no recent travel   History reviewed. No pertinent past medical history.  Patient Active Problem List   Diagnosis Date Noted   Pulmonary embolism ([place]) 05/13/2021    History reviewed. No pertinent surgical history.     No family history on file.  Social History   Tobacco Use   Smoking status: Every Day    Packs/day: 0.50    Years: 40.00    Pack years: 20.00    Types: Cigarettes    Passive exposure: Current   Smokeless tobacco: Never  Vaping Use   Vaping Use: Former   Substances: Nicotine, Flavoring  Substance Use Topics   Alcohol use: Yes    Comment: occ   Drug use: Never    Home Medications Prior to Admission medications   Medication Sig Start Date End Date Taking? Authorizing Provider  ibuprofen (ADVIL,MOTRIN) 600 MG tablet Take 1 tablet (600 mg total) by mouth every 6 (six) hours as needed for moderate pain. 08/22/18  Yes Duffy Bruce, MD  nicotine (NICODERM CQ) 21 mg/24hr patch Place 1 patch (21 mg total) onto the skin daily. Patient not taking: No sig reported  04/28/18   Jaynee Eagles, PA-C  ondansetron (ZOFRAN ODT) 4 MG disintegrating tablet Take 1 tablet (4 mg total) by mouth every 8 (eight) hours as needed for nausea or vomiting. 08/22/18   Duffy Bruce, MD  oxyCODONE-acetaminophen (PERCOCET/ROXICET) 5-325 MG tablet Take 1-2 tablets by mouth every 4 (four) hours as needed for moderate pain or severe pain. 08/22/18   Duffy Bruce, MD    Allergies    Patient has no known allergies.  Review of Systems   Review of Systems  Constitutional:  Negative for fever.  HENT:  Negative for sore throat.   Eyes:  Negative for visual disturbance.  Respiratory:  Negative for cough and shortness of breath.   Cardiovascular:  Positive for chest pain.  Gastrointestinal:  Negative for abdominal pain, diarrhea, nausea and vomiting.  Genitourinary:  Negative for difficulty urinating.  Musculoskeletal:  Positive for back pain. Negative for neck stiffness.  Skin:  Negative for rash.  Neurological:  Negative for dizziness, syncope, light-headedness and headaches.   Physical Exam Updated Vital Signs BP 130/78 (BP Location: Left Arm)   Pulse (!) 50   Temp 97.8 F (36.6 C) (Oral)   Resp 17   Ht '5\' 2"'$  (1.575 m)   Wt 61.2 kg   SpO2 100%   BMI 24.69 kg/m   Physical Exam Vitals and nursing  note reviewed.  Constitutional:      General: He is not in acute distress.    Appearance: He is well-developed. He is not diaphoretic.  HENT:     Head: Normocephalic and atraumatic.  Eyes:     Conjunctiva/sclera: Conjunctivae normal.  Cardiovascular:     Rate and Rhythm: Normal rate and regular rhythm.     Heart sounds: Normal heart sounds. No murmur heard.   No friction rub. No gallop.  Pulmonary:     Effort: Pulmonary effort is normal. No respiratory distress.     Breath sounds: Normal breath sounds. No wheezing or rales.  Abdominal:     General: There is no distension.     Palpations: Abdomen is soft.     Tenderness: There is no abdominal tenderness. There is  no guarding.  Musculoskeletal:     Cervical back: Normal range of motion.  Skin:    General: Skin is warm and dry.  Neurological:     Mental Status: He is alert and oriented to person, place, and time.    ED Results / Procedures / Treatments   Labs (all labs ordered are listed, but only abnormal results are displayed) Labs Reviewed  D-DIMER, QUANTITATIVE - Abnormal; Notable for the following components:      Result Value   D-Dimer, Quant 1.13 (*)    All other components within normal limits  HEPATIC FUNCTION PANEL - Abnormal; Notable for the following components:   AST 61 (*)    ALT 85 (*)    Alkaline Phosphatase 578 (*)    All other components within normal limits  RESP PANEL BY RT-PCR (FLU A&B, COVID) [id number]  BASIC METABOLIC PANEL  CBC  HEPARIN LEVEL (UNFRACTIONATED)  TROPONIN I (HIGH SENSITIVITY)  TROPONIN I (HIGH SENSITIVITY)    EKG EKG Interpretation  Date/Time:  Wednesday May 13 2021 11:13:13 EDT Ventricular Rate:  60 PR Interval:  153 QRS Duration: 96 QT Interval:  405 QTC Calculation: 405 R Axis:   44 Text Interpretation: Sinus rhythm No previous ECGs available Confirmed by Gareth Morgan [phone]) on 05/13/2021 11:57:45 AM  Radiology DG Chest 2 View  Result Date: 05/13/2021 CLINICAL DATA:  Chest pain. EXAM: CHEST - 2 VIEW COMPARISON:  No prior. FINDINGS: Mediastinum and hilar structures normal. Low lung volumes with mild bibasilar atelectasis. No pleural effusion or pneumothorax. Heart size normal. Mild thoracic spine scoliosis. No acute bony abnormality. IMPRESSION: Low lung volumes with mild bibasilar atelectasis. Electronically Signed   By: Marcello Moores  Register   On: 05/13/2021 12:04   CT Angio Chest PE W and/or Wo Contrast  Result Date: 05/13/2021 CLINICAL DATA:  Shortness of breath EXAM: CT ANGIOGRAPHY CHEST WITH CONTRAST TECHNIQUE: Multidetector CT imaging of the chest was performed using the standard protocol during bolus administration of intravenous  contrast. Multiplanar CT image reconstructions and MIPs were obtained to evaluate the vascular anatomy. CONTRAST:  53m OMNIPAQUE IOHEXOL 350 MG/ML SOLN COMPARISON:  None. FINDINGS: Cardiovascular: Adequate contrast opacification of the pulmonary arteries. Subsegmental pulmonary embolus seen in the right lower lobe pulmonary arteries. There is normal heart size. No pericardial effusion. 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 Mediastinum/Nodes: No enlarged mediastinal, hilar, or axillary lymph nodes. Thyroid gland, trachea, and esophagus demonstrate no significant findings. Lungs/Pleura: Lungs are clear. No pleural effusion or pneumothorax. No Upper Abdomen: Ill-defined masslike area seen in the right lobe of the liver measuring approximately 6.2 x 5.2 cm with adjacent  areas of linear low density. Musculoskeletal: Lytic lesion of the T4 vertebral body with possible soft tissue extension into the left neural foramen. No evidence of retropulsion Review of the MIP images confirms the above findings. IMPRESSION: Positive for subsegmental pulmonary embolus in the right lower lobe. Ill-defined masslike area seen in the right lobe of the liver with adjacent intrahepatic biliary ductal dilation. 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 Possible soft tissue extension into the left neural foramen. Finding could be further evaluated with contrast enhanced MRI of the spine. Critical Value/emergent results were called by telephone at the time of interpretation on 05/13/2021 at 2:00 pm to provider [facility] , who verbally acknowledged these results. Electronically Signed   By: Yetta Glassman MD   On: 05/13/2021 14:01    Procedures .Critical Care  Date/Time: 05/13/2021 8:31 PM Performed by: Gareth Morgan, MD Authorized by: Gareth Morgan, MD   Critical care provider statement:    Critical  care time (minutes):  45   Critical care was time spent personally by me on the following activities:  Discussions with consultants, evaluation of patient's response to treatment, examination of patient, ordering and performing treatments and interventions, ordering and review of laboratory studies, ordering and review of radiographic studies, pulse oximetry, re-evaluation of patient's condition, obtaining history from patient or surrogate and review of old charts   Medications Ordered in ED Medications  heparin ADULT infusion 100 units/mL (25000 units/246m) (1,000 Units/hr Intravenous New Bag/Given 05/13/21 1510)  iohexol (OMNIPAQUE) 350 MG/ML injection 60 mL (60 mLs Intravenous Contrast Given 05/13/21 1312)  heparin bolus via infusion 3,600 Units (3,600 Units Intravenous Bolus from Bag 05/13/21 1510)  morphine 4 MG/ML injection 4 mg (4 mg Intravenous Given 05/13/21 1540)    ED Course  I have reviewed the triage vital signs and the nursing notes.  Pertinent labs & imaging results that were available during my care of the patient were reviewed by me and considered in my medical decision making (see chart for details).    MDM Rules/Calculators/A&P                            65year old male who presents with concern for chest and back pain.  Differential diagnosis for chest pain includes pulmonary embolus, dissection, pneumothorax, pneumonia, ACS, myocarditis, pericarditis.  EKG was done and evaluate by me and showed no acute ST changes and no signs of pericarditis. Chest x-ray was done and evaluated by me and radiology and showed no sign of pneumonia or pneumothorax. Patient is low risk HEART score and had delta troponins which were both negative. DDimer positive.   CT PE study completed showing subsegmental pulmonary embolus in the right lower lobe, ill-defined masslike area seen in the right lobe of the liver with adjacent intrahepatic biliary ductal dilation, lytic lesion of the T4 vertebral  vertebral body compatible with osseous metastatic disease with possible soft tissue extension into the left neural neural foramen.  Discussed need for anticoagulation in setting of spinal lesion with neurology and neurosurgery, Dr. JArdis Hughs who at this point agrees with anticoagulation, and medical management of findings, MRI, biopsy, no need for acute NSU intervention.  Initiated heparin gtt, admitted to hospitalist for further care.    Final Clinical Impression(s) / ED Diagnoses Final diagnoses:  Single subsegmental pulmonary embolism without acute cor pulmonale (HCC)  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([facility]    Rx / DC Orders ED Discharge  Orders     None        Gareth Morgan, MD 05/13/21 1620    Gareth Morgan, MD 05/13/21 2032

## 2021-05-13 NOTE — H&P (Signed)
History and Physical    Savoy Birchard [id number] DOB: 1956/01/18 DOA: 05/13/2021  PCP: Pcp, No  Patient coming from: Home.  [place] [facility] used.  Chief Complaint: Left-sided chest pain and back pain.  HPI: Jason Lyons is a 65 y.o. male with no significant past medical history has been experiencing left-sided chest pain and mid back pain over the last 1 month.  Pain increased on deep inspiration.  Denies any fever chills productive cough or any weakness of the extremities or incontinence of bowel or urine.  Has had at least 2 pounds weight loss in the last 1 month.  ED Course: In the ER patient had labs done which showed elevated LFTs alkaline phosphatase was 578 AST 61 ALT 85 total bilirubin 0.9.  CT angiogram of the chest shows subsegmental pulmonary embolism on the right side with ill-defined masslike area within the right lobe of the liver with intrahepatic biliary ductal dilatation and also T4 lytic lesion with possible soft tissue invasion into the neuroforamen concerning for metastatic lesion.  ER physician discussed with on-call neurosurgeon Dr. Ardis Hughs so at this time advised okay to start heparin for pulm embolism.  Patient admitted for further work-up.  COVID test was negative.  Review of Systems: As per HPI, rest all negative.   History reviewed. No pertinent past medical history.  History reviewed. No pertinent surgical history.   reports that he has been smoking cigarettes. He has a 20.00 pack-year smoking history. He has been exposed to tobacco smoke. He has never used smokeless tobacco. He reports current alcohol use. He reports that he does not use drugs.  No Known Allergies  Family History  Problem Relation Age of Onset   Cancer Neg Hx     Prior to Admission medications   Medication Sig Start Date End Date Taking? Authorizing Provider  ibuprofen (ADVIL,MOTRIN) 600 MG tablet Take 1 tablet (600 mg total) by mouth every 6 (six) hours as needed for moderate pain.  08/22/18  Yes Duffy Bruce, MD  nicotine (NICODERM CQ) 21 mg/24hr patch Place 1 patch (21 mg total) onto the skin daily. Patient not taking: No sig reported 04/28/18   Jaynee Eagles, PA-C  ondansetron (ZOFRAN ODT) 4 MG disintegrating tablet Take 1 tablet (4 mg total) by mouth every 8 (eight) hours as needed for nausea or vomiting. 08/22/18   Duffy Bruce, MD  oxyCODONE-acetaminophen (PERCOCET/ROXICET) 5-325 MG tablet Take 1-2 tablets by mouth every 4 (four) hours as needed for moderate pain or severe pain. 08/22/18   Duffy Bruce, MD    Physical Exam: Constitutional: Moderately built and nourished. Vitals:   05/13/21 1700 05/13/21 1811 05/13/21 2000 05/13/21 2147  BP: (!) 146/84 (!) 155/98 (!) 155/81 (!) 170/81  Pulse: (!) 55 (!) 50 74 (!) 48  Resp: '19 14 17 18  '$ Temp:   97.8 F (36.6 C) 98.7 F (37.1 C)  TempSrc:   Oral Oral  SpO2: 100% 100% 99% 100%  Weight:      Height:       Eyes: Anicteric no pallor. ENMT: No discharge from the ears eyes nose and mouth. Neck: No mass felt.  No neck rigidity. Respiratory: No rhonchi or crepitations. Cardiovascular: S1-S2 heard. Abdomen: Soft nontender bowel sound present. Musculoskeletal: No edema. Skin: No rash. Neurologic: Alert awake oriented to time place and person.  Moves all extremities. Psychiatric: Appears normal.  Normal affect.   Labs on Admission: I have personally reviewed following labs and imaging studies  CBC: Recent Labs  Lab 05/13/21  1128  WBC 6.4  HGB 15.8  HCT 46.3  MCV 91.9  PLT [id number]   Basic Metabolic Panel: Recent Labs  Lab 05/13/21 1128  NA 142  K 4.2  CL 107  CO2 25  GLUCOSE 90  BUN 14  CREATININE 0.66  CALCIUM 9.2   GFR: Estimated Creatinine Clearance: 70.1 mL/min (by C-G formula based on SCr of 0.66 mg/dL). Liver Function Tests: Recent Labs  Lab 05/13/21 1500  AST 61*  ALT 85*  ALKPHOS 578*  BILITOT 0.9  PROT 7.1  ALBUMIN 4.1   No results for input(s): LIPASE, AMYLASE in the  last 168 hours. No results for input(s): AMMONIA in the last 168 hours. Coagulation Profile: No results for input(s): INR, PROTIME in the last 168 hours. Cardiac Enzymes: No results for input(s): CKTOTAL, CKMB, CKMBINDEX, TROPONINI in the last 168 hours. BNP (last 3 results) No results for input(s): PROBNP in the last 8760 hours. HbA1C: No results for input(s): HGBA1C in the last 72 hours. CBG: No results for input(s): GLUCAP in the last 168 hours. Lipid Profile: No results for input(s): CHOL, HDL, LDLCALC, TRIG, CHOLHDL, LDLDIRECT in the last 72 hours. Thyroid Function Tests: No results for input(s): TSH, T4TOTAL, FREET4, T3FREE, THYROIDAB in the last 72 hours. Anemia Panel: No results for input(s): VITAMINB12, FOLATE, FERRITIN, TIBC, IRON, RETICCTPCT in the last 72 hours. Urine analysis:    Component Value Date/Time   COLORURINE YELLOW 08/22/2018 1125   APPEARANCEUR CLEAR 08/22/2018 1125   LABSPEC 1.003 (L) 08/22/2018 1125   PHURINE 6.0 08/22/2018 1125   GLUCOSEU NEGATIVE 08/22/2018 1125   HGBUR LARGE (A) 08/22/2018 1125   BILIRUBINUR NEGATIVE 08/22/2018 1125   KETONESUR NEGATIVE 08/22/2018 1125   PROTEINUR NEGATIVE 08/22/2018 1125   NITRITE NEGATIVE 08/22/2018 1125   LEUKOCYTESUR NEGATIVE 08/22/2018 1125   Sepsis Labs: '@LABRCNTIP'$ (procalcitonin:4,lacticidven:4) ) Recent Results (from the past 240 hour(s))  Resp Panel by RT-PCR (Flu A&B, Covid) Nasopharyngeal Swab     Status: None   Collection Time: 05/13/21  3:40 PM   Specimen: Nasopharyngeal Swab; Nasopharyngeal(NP) swabs in vial transport medium  Result Value Ref Range Status   SARS Coronavirus 2 by RT PCR NEGATIVE NEGATIVE Final    Comment: (NOTE) SARS-CoV-2 target nucleic acids are NOT DETECTED.  The SARS-CoV-2 RNA is generally detectable in upper respiratory specimens during the acute phase of infection. The lowest concentration of SARS-CoV-2 viral copies this assay can detect is 138 copies/mL. A negative result  does not preclude SARS-Cov-2 infection and should not be used as the sole basis for treatment or other patient management decisions. A negative result may occur with  improper specimen collection/handling, submission of specimen other than nasopharyngeal swab, presence of viral mutation(s) within the areas targeted by this assay, and inadequate number of viral copies(<138 copies/mL). A negative result must be combined with clinical observations, patient history, and epidemiological information. The expected result is Negative.  Fact Sheet for Patients:  EntrepreneurPulse.com.au  Fact Sheet for Healthcare Providers:  IncredibleEmployment.be  This test is no t yet approved or cleared by the [place] FDA and  has been authorized for detection and/or diagnosis of SARS-CoV-2 by FDA under an Emergency Use Authorization (EUA). This EUA will remain  in effect (meaning this test can be used) for the duration of the COVID-19 declaration under Section 564(b)(1) of the Act, 21 U.S.C.section 360bbb-3(b)(1), unless the authorization is terminated  or revoked sooner.       Influenza A by PCR NEGATIVE NEGATIVE Final   Influenza  B by PCR NEGATIVE NEGATIVE Final    Comment: (NOTE) The Xpert Xpress SARS-CoV-2/FLU/RSV plus assay is intended as an aid in the diagnosis of influenza from Nasopharyngeal swab specimens and should not be used as a sole basis for treatment. Nasal washings and aspirates are unacceptable for Xpert Xpress SARS-CoV-2/FLU/RSV testing.  Fact Sheet for Patients: EntrepreneurPulse.com.au  Fact Sheet for Healthcare Providers: IncredibleEmployment.be  This test is not yet approved or cleared by the [place] FDA and has been authorized for detection and/or diagnosis of SARS-CoV-2 by FDA under an Emergency Use Authorization (EUA). This EUA will remain in effect (meaning this test can be used) for  the duration of the COVID-19 declaration under Section 564(b)(1) of the Act, 21 U.S.C. section 360bbb-3(b)(1), unless the authorization is terminated or revoked.  Performed at [facility], [address] [place], [place] [address]      Radiological Exams on Admission: DG Chest 2 View  Result Date: 05/13/2021 CLINICAL DATA:  Chest pain. EXAM: CHEST - 2 VIEW COMPARISON:  No prior. FINDINGS: Mediastinum and hilar structures normal. Low lung volumes with mild bibasilar atelectasis. No pleural effusion or pneumothorax. Heart size normal. Mild thoracic spine scoliosis. No acute bony abnormality. IMPRESSION: Low lung volumes with mild bibasilar atelectasis. Electronically Signed   By: Marcello Moores  Register   On: 05/13/2021 12:04   CT Angio Chest PE W and/or Wo Contrast  Result Date: 05/13/2021 CLINICAL DATA:  Shortness of breath EXAM: CT ANGIOGRAPHY CHEST WITH CONTRAST TECHNIQUE: Multidetector CT imaging of the chest was performed using the standard protocol during bolus administration of intravenous contrast. Multiplanar CT image reconstructions and MIPs were obtained to evaluate the vascular anatomy. CONTRAST:  62m OMNIPAQUE IOHEXOL 350 MG/ML SOLN COMPARISON:  None. FINDINGS: Cardiovascular: Adequate contrast opacification of the pulmonary arteries. Subsegmental pulmonary embolus seen in the right lower lobe pulmonary arteries. There is normal heart size. No pericardial effusion. 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 Mediastinum/Nodes: No enlarged mediastinal, hilar, or axillary lymph nodes. Thyroid gland, trachea, and esophagus demonstrate no significant findings. Lungs/Pleura: Lungs are clear. No pleural effusion or pneumothorax. No Upper Abdomen: Ill-defined masslike area seen in the right lobe of the liver measuring approximately 6.2 x 5.2 cm with adjacent areas of linear low density. Musculoskeletal:  Lytic lesion of the T4 vertebral body with possible soft tissue extension into the left neural foramen. No evidence of retropulsion Review of the MIP images confirms the above findings. IMPRESSION: Positive for subsegmental pulmonary embolus in the right lower lobe. Ill-defined masslike area seen in the right lobe of the liver with adjacent intrahepatic biliary ductal dilation. 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 Possible soft tissue extension into the left neural foramen. Finding could be further evaluated with contrast enhanced MRI of the spine. Critical Value/emergent results were called by telephone at the time of interpretation on 05/13/2021 at 2:00 pm to [facility], who verbally acknowledged these results. Electronically Signed   By: LYetta GlassmanMD   On: 05/13/2021 14:01    EKG: Independently reviewed.  Normal sinus rhythm.  Assessment/Plan Principal Problem:   Pulmonary embolism (HCC) Active Problems:   Liver mass   Elevated LFTs    Pulmonary embolism -presently hemodynamically stable could be provoked by possible malignancy.  On heparin now.  Will check Dopplers of the lower extremity. 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 Ardis Hughs who advised okay to keep on heparin for the pulm embolism.  We will get MRI T-spine, L-spine and C-spine with and without contrast.  Patient also will need probably biopsy of the liver lesion.  We will need to put oncology and neurosurgery input. Elevated LFTs likely from liver lesion.  I am also checking antidepressants panel.  Follow LFTs. Elevated blood pressure reading we will closely monitor blood pressure trends.  As needed IV hydralazine.  Since patient has pulmonary embolism and also possible metastatic lesions to the spine and  metastatic lesions in the liver will need further work-up and inpatient status.   DVT prophylaxis: On heparin infusion for pulm embolism. Code Status: Full code. Family Communication: Discussed with patient. Disposition Plan: Home. Consults called: ER physician discussed with neurosurgeon. Admission status: Inpatient.   Rise Patience MD [facility] Pager [phone].  If 7PM-7AM, please contact night-coverage [email or web address] Password [email or web address]  05/13/2021, 11:30 PM

## 2021-05-14 ENCOUNTER — Inpatient Hospital Stay (HOSPITAL_COMMUNITY): Payer: Managed Care, Other (non HMO)

## 2021-05-14 DIAGNOSIS — I2699 Other pulmonary embolism without acute cor pulmonale: Secondary | ICD-10-CM | POA: Diagnosis not present

## 2021-05-14 DIAGNOSIS — Z789 Other specified health status: Secondary | ICD-10-CM

## 2021-05-14 DIAGNOSIS — R937 Abnormal findings on diagnostic imaging of other parts of musculoskeletal system: Secondary | ICD-10-CM

## 2021-05-14 DIAGNOSIS — F17200 Nicotine dependence, unspecified, uncomplicated: Secondary | ICD-10-CM

## 2021-05-14 DIAGNOSIS — R7989 Other specified abnormal findings of blood chemistry: Secondary | ICD-10-CM

## 2021-05-14 LAB — BASIC METABOLIC PANEL
Anion gap: 6 (ref 5–15)
BUN: 16 mg/dL (ref 8–23)
CO2: 29 mmol/L (ref 22–32)
Calcium: 8.7 mg/dL — ABNORMAL LOW (ref 8.9–10.3)
Chloride: 104 mmol/L (ref 98–111)
Creatinine, Ser: 0.76 mg/dL (ref 0.61–1.24)
GFR, Estimated: >60 mL/min (ref >60)
Glucose, Bld: 131 mg/dL — ABNORMAL HIGH (ref 70–99)
Potassium: 3.7 mmol/L (ref 3.5–5.1)
Sodium: 139 mmol/L (ref 135–145)

## 2021-05-14 LAB — HEPATITIS PANEL, ACUTE
HCV Ab: NONREACTIVE
Hep A IgM: NONREACTIVE
Hep B C IgM: NONREACTIVE
Hepatitis B Surface Ag: NONREACTIVE

## 2021-05-14 LAB — CBC
HCT: 47.6 % (ref 39.0–52.0)
Hemoglobin: 16 g/dL (ref 13.0–17.0)
MCH: 32.2 pg (ref 26.0–34.0)
MCHC: 33.6 g/dL (ref 30.0–36.0)
MCV: 95.8 fL (ref 80.0–100.0)
Platelets: 208 10*3/uL (ref 150–400)
RBC: 4.97 MIL/uL (ref 4.22–5.81)
RDW: 15.4 % (ref 11.5–15.5)
WBC: 7.2 10*3/uL (ref 4.0–10.5)
nRBC: 0 % (ref 0.0–0.2)

## 2021-05-14 LAB — HEPATIC FUNCTION PANEL
ALT: 83 U/L — ABNORMAL HIGH (ref 0–44)
AST: 57 U/L — ABNORMAL HIGH (ref 15–41)
Albumin: 3.9 g/dL (ref 3.5–5.0)
Alkaline Phosphatase: 521 U/L — ABNORMAL HIGH (ref 38–126)
Bilirubin, Direct: <0.1 mg/dL (ref 0.0–0.2)
Total Bilirubin: 0.5 mg/dL (ref 0.3–1.2)
Total Protein: 7.2 g/dL (ref 6.5–8.1)

## 2021-05-14 LAB — COMPREHENSIVE METABOLIC PANEL
ALT: 75 U/L — ABNORMAL HIGH (ref 0–44)
AST: 52 U/L — ABNORMAL HIGH (ref 15–41)
Albumin: 3.5 g/dL (ref 3.5–5.0)
Alkaline Phosphatase: 487 U/L — ABNORMAL HIGH (ref 38–126)
Anion gap: 7 (ref 5–15)
BUN: 17 mg/dL (ref 8–23)
CO2: 32 mmol/L (ref 22–32)
Calcium: 8.8 mg/dL — ABNORMAL LOW (ref 8.9–10.3)
Chloride: 100 mmol/L (ref 98–111)
Creatinine, Ser: 0.87 mg/dL (ref 0.61–1.24)
GFR, Estimated: >60 mL/min (ref >60)
Glucose, Bld: 74 mg/dL (ref 70–99)
Potassium: 3.8 mmol/L (ref 3.5–5.1)
Sodium: 139 mmol/L (ref 135–145)
Total Bilirubin: 0.6 mg/dL (ref 0.3–1.2)
Total Protein: 6.5 g/dL (ref 6.5–8.1)

## 2021-05-14 LAB — HIV ANTIBODY (ROUTINE TESTING W REFLEX): HIV Screen 4th Generation wRfx: NONREACTIVE

## 2021-05-14 LAB — MAGNESIUM: Magnesium: 2.5 mg/dL — ABNORMAL HIGH (ref 1.7–2.4)

## 2021-05-14 LAB — PHOSPHORUS: Phosphorus: 3.5 mg/dL (ref 2.5–4.6)

## 2021-05-14 LAB — HEPARIN LEVEL (UNFRACTIONATED): Heparin Unfractionated: 0.36 IU/mL (ref 0.30–0.70)

## 2021-05-14 MED ORDER — NICOTINE 14 MG/24HR TD PT24
14.0000 mg | MEDICATED_PATCH | Freq: Every day | TRANSDERMAL | Status: DC
  Administered 2021-05-14 – 2021-05-19 (×6): 14 mg via TRANSDERMAL
  Filled 2021-05-14 (×6): qty 1

## 2021-05-14 MED ORDER — IOHEXOL 9 MG/ML PO SOLN
500.0000 mL | ORAL | Status: AC
Start: 2021-05-14 — End: 2021-05-14
  Administered 2021-05-14: 500 mL via ORAL

## 2021-05-14 MED ORDER — GADOBUTROL 1 MMOL/ML IV SOLN
6.0000 mL | Freq: Once | INTRAVENOUS | Status: AC | PRN
  Administered 2021-05-14: 6 mL via INTRAVENOUS

## 2021-05-14 MED ORDER — OXYCODONE HCL 5 MG PO TABS
5.0000 mg | ORAL_TABLET | Freq: Three times a day (TID) | ORAL | Status: DC | PRN
Start: 2021-05-14 — End: 2021-05-18
  Administered 2021-05-14 – 2021-05-18 (×8): 5 mg via ORAL
  Filled 2021-05-14 (×8): qty 1

## 2021-05-14 MED ORDER — ACETAMINOPHEN 325 MG PO TABS: 650.0000 mg | ORAL_TABLET | Freq: Four times a day (QID) | ORAL | Status: DC | PRN

## 2021-05-14 MED ORDER — IOHEXOL 350 MG/ML SOLN
100.0000 mL | Freq: Once | INTRAVENOUS | Status: AC | PRN
  Administered 2021-05-14: 100 mL via INTRAVENOUS

## 2021-05-14 MED ORDER — IOHEXOL 9 MG/ML PO SOLN
ORAL | Status: AC
  Administered 2021-05-14: 500 mL via ORAL
  Filled 2021-05-14: qty 1000

## 2021-05-14 NOTE — Progress Notes (Signed)
Lower extremity venous bilateral study completed.   Please see CV Proc for preliminary results.   Bubber Rothert, RDMS, RVT  

## 2021-05-14 NOTE — Progress Notes (Signed)
PROGRESS NOTE  Jason Lyons [id number] DOB: Apr 29, 1956   PCP: Pcp, No  Patient is from: Home.  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  He was a started on heparin drip.  EDP discussed with neurosurgery, Dr. Ardis Hughs who agrees with anticoagulation, medical management, MRI and biopsy but no need for acute NSU intervention.  MRI cervical spine, thoracic spine, lumbar spine ordered.  IR consulted the next day and recommended CT abdomen and pelvis.  Preliminary on LE venous Doppler negative for DVT.  Subjective: Seen and examined earlier this morning with the help of video interpreter with ID number [id number].  No major events overnight of this morning.  Continues to have pleuritic chest pain with deep breathing.  He denies focal numbness, tingling or weakness.  Denies bowel or bladder issues.  He denies family history of cancer or blood clot.Marland Kitchen  He never had colonoscopy.    Objective: Vitals:   05/14/21 0200 05/14/21 0534 05/14/21 1007 05/14/21 1328  BP: 136/78 122/81 126/80 121/79  Pulse: (!) 50 (!) 56 (!) 54 (!) 51  Resp: '16 16 14 14  '$ Temp: 97.7 F (36.5 C) 97.6 F (36.4 C) 98.3 F (36.8 C) 98.1 F (36.7 C)  TempSrc: Oral Oral Oral Oral  SpO2: 97% 97% 97% 97%  Weight:      Height:       No intake or output data in the 24 hours ending 05/14/21 1542 Filed Weights   05/13/21 1150  Weight: 61.2 kg    Examination:  GENERAL: No apparent distress.  Nontoxic. HEENT: MMM.  Vision and hearing grossly intact.  NECK: Supple.  No apparent JVD.  RESP:  No IWOB.  Fair aeration bilaterally. CVS:   RRR. Heart sounds normal.  ABD/GI/GU: BS+. Abd soft, NTND.  MSK/EXT:  Moves extremities. No apparent deformity. No edema.  No calf tenderness. SKIN: no apparent skin lesion or wound NEURO: Awake, alert and oriented appropriately.  No apparent focal neuro deficit. PSYCH: Calm. Normal affect.   Procedures:  None  Microbiology summarized: [id number] and influenza PCR nonreactive.  Assessment & Plan: Acute RLL subsegmental PE-still with pleuritic chest pain.  LE [place] negative for DVT.  Hemodynamically stable. 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  Never had colonoscopy. T4 lesion with with possible soft tissue invasion into left neural foramina -Was noted on CTA chest for PE. -Neurosurgery, Dr. Ardis Hughs consulted and did not feel acute NICU intervention is indicated. -IR consulted, recommended CT abdomen and pelvis-ordered. -Follow MRI spine's -Will have oncology involved  Elevated liver enzymes: Likely due to the above.  Improving. -Continue monitoring  Elevated BP: No history of hypertension.  Normotensive this morning. -Continue monitoring -As needed hydralazine  Tobacco use disorder: Current smoker. -Encourage cessation -Nicotine patch  Language barrier affecting communication -Using video interpreter.  Body mass index is 24.69 kg/m.         DVT prophylaxis:    On IV heparin.  Code Status: Full code Family Communication: Patient and/or RN. Available if any question.  Level of care: Telemetry Status is: Inpatient  Remains inpatient appropriate because:Ongoing diagnostic testing needed not appropriate for outpatient work up, IV treatments appropriate due to intensity of illness or inability to take PO, and Inpatient level of care appropriate due to severity of illness  Dispo: The patient is from: Home              Anticipated d/c is to: Home              Patient currently is not medically stable  to d/c.   Difficult to place patient No       Consultants:  Neurosurgery Interventional radiology   Sch Meds:  Scheduled Meds:  nicotine  14 mg Transdermal Daily   Continuous Infusions:  heparin 1,000 Units/hr (05/14/21 1449)   PRN Meds:.  Antimicrobials: Anti-infectives (From admission, onward)    None        I have personally reviewed the following labs and images: CBC: Recent Labs  Lab 05/13/21 1128 05/14/21 0528  WBC 6.4 7.2  HGB 15.8 16.0  HCT 46.3 47.6  MCV 91.9 95.8  PLT 223 208   BMP &GFR Recent Labs  Lab 05/13/21 1128 05/14/21 0528  NA 142 139  K 4.2 3.7  CL 107 104  CO2 25 29  GLUCOSE 90 131*  BUN 14 16  CREATININE 0.66 0.76  CALCIUM 9.2 8.7*   Estimated Creatinine Clearance: 70.1 mL/min (by C-G formula based on SCr of 0.76 mg/dL). Liver & Pancreas: Recent Labs  Lab 05/13/21 1500 05/13/21 2327  AST 61* 57*  ALT 85* 83*  ALKPHOS 578* 521*  BILITOT 0.9 0.5  PROT 7.1 7.2  ALBUMIN 4.1 3.9   No results for input(s): LIPASE, AMYLASE in the last 168 hours. No results for input(s): AMMONIA in the last 168 hours. Diabetic: No results for input(s): HGBA1C in the last 72 hours. No results for input(s): GLUCAP in the last 168 hours. Cardiac Enzymes: No results for input(s): CKTOTAL, CKMB, CKMBINDEX, TROPONINI in the last 168 hours. No results for input(s): PROBNP in the last 8760 hours. Coagulation Profile: No results for input(s): INR, PROTIME in the last 168 hours. Thyroid Function Tests: No results for input(s): TSH, T4TOTAL, FREET4, T3FREE, THYROIDAB in the last 72 hours. Lipid Profile: No results for input(s): CHOL, HDL, LDLCALC, TRIG, CHOLHDL, LDLDIRECT in the last 72 hours. Anemia Panel: No results for input(s): VITAMINB12, FOLATE, FERRITIN, TIBC, IRON, RETICCTPCT in the last 72 hours. Urine analysis:    Component Value Date/Time   COLORURINE YELLOW 08/22/2018 1125   APPEARANCEUR CLEAR 08/22/2018 1125   LABSPEC 1.003 (L)  08/22/2018 1125   PHURINE 6.0 08/22/2018 1125   GLUCOSEU NEGATIVE 08/22/2018 1125   HGBUR LARGE (A) 08/22/2018 1125   BILIRUBINUR NEGATIVE 08/22/2018 1125   KETONESUR NEGATIVE 08/22/2018 1125   PROTEINUR NEGATIVE 08/22/2018 1125   NITRITE NEGATIVE 08/22/2018 1125   LEUKOCYTESUR NEGATIVE 08/22/2018 1125   Sepsis Labs: Invalid input(s): PROCALCITONIN, [place]  Microbiology: Recent Results (from the past 240 hour(s))  Resp Panel by RT-PCR (Flu A&B, Covid) Nasopharyngeal Swab     Status: None   Collection Time: 05/13/21  3:40 PM   Specimen: Nasopharyngeal Swab; Nasopharyngeal(NP) swabs in vial transport medium  Result Value Ref Range Status   SARS Coronavirus 2 by RT PCR NEGATIVE NEGATIVE Final    Comment: (NOTE) SARS-CoV-2 target nucleic acids are NOT DETECTED.  The SARS-CoV-2 RNA is generally detectable in upper respiratory specimens during the acute phase of infection. The lowest concentration of  SARS-CoV-2 viral copies this assay can detect is 138 copies/mL. A negative result does not preclude SARS-Cov-2 infection and should not be used as the sole basis for treatment or other patient management decisions. A negative result may occur with  improper specimen collection/handling, submission of specimen other than nasopharyngeal swab, presence of viral mutation(s) within the areas targeted by this assay, and inadequate number of viral copies(<138 copies/mL). A negative result must be combined with clinical observations, patient history, and epidemiological information. The expected result is Negative.  Fact Sheet for Patients:  EntrepreneurPulse.com.au  Fact Sheet for Healthcare Providers:  IncredibleEmployment.be  This test is no t yet approved or cleared by the [place] FDA and  has been authorized for detection and/or diagnosis of SARS-CoV-2 by FDA under an Emergency Use Authorization (EUA). This EUA will remain  in effect  (meaning this test can be used) for the duration of the COVID-19 declaration under Section 564(b)(1) of the Act, 21 U.S.C.section 360bbb-3(b)(1), unless the authorization is terminated  or revoked sooner.       Influenza A by PCR NEGATIVE NEGATIVE Final   Influenza B by PCR NEGATIVE NEGATIVE Final    Comment: (NOTE) The Xpert Xpress SARS-CoV-2/FLU/RSV plus assay is intended as an aid in the diagnosis of influenza from Nasopharyngeal swab specimens and should not be used as a sole basis for treatment. Nasal washings and aspirates are unacceptable for Xpert Xpress SARS-CoV-2/FLU/RSV testing.  Fact Sheet for Patients: EntrepreneurPulse.com.au  Fact Sheet for Healthcare Providers: IncredibleEmployment.be  This test is not yet approved or cleared by the [place] FDA and has been authorized for detection and/or diagnosis of SARS-CoV-2 by FDA under an Emergency Use Authorization (EUA). This EUA will remain in effect (meaning this test can be used) for the duration of the COVID-19 declaration under Section 564(b)(1) of the Act, 21 U.S.C. section 360bbb-3(b)(1), unless the authorization is terminated or revoked.  Performed at [facility], [address] [place], [place] [address]     Radiology Studies: VAS [place] LOWER EXTREMITY VENOUS (DVT)  Result Date: 05/14/2021  Lower Venous DVT Study Patient Name:  [facility]  Date of Exam:   05/14/2021 Medical Rec #: [id number]   Accession #:    [id number] Date of Birth: [birth year]  Patient Gender: M Patient Age:   64Y Exam Location:  [facility] Procedure:      VAS [place] LOWER EXTREMITY VENOUS (DVT) Referring Phys: Wendee Beavers --------------------------------------------------------------------------------  Indications: Pulmonary embolism.  Anticoagulation: Heparin. Comparison Study: No prior studies. Performing Technologist: Darlin Coco RDMS,RVT  Examination Guidelines: A complete  evaluation includes B-mode imaging, spectral Doppler, color Doppler, and power Doppler as needed of all accessible portions of each vessel. Bilateral testing is considered an integral part of a complete examination. Limited examinations for reoccurring indications may be performed as noted. The reflux portion of the exam is performed with the patient in reverse Trendelenburg.  +---------+---------------+---------+-----------+----------+--------------+ RIGHT    CompressibilityPhasicitySpontaneityPropertiesThrombus Aging +---------+---------------+---------+-----------+----------+--------------+ CFV      Full           Yes      Yes                                 +---------+---------------+---------+-----------+----------+--------------+ SFJ      Full                                                        +---------+---------------+---------+-----------+----------+--------------+  FV Prox  Full                                                        +---------+---------------+---------+-----------+----------+--------------+ FV Mid   Full                                                        +---------+---------------+---------+-----------+----------+--------------+ FV DistalFull                                                        +---------+---------------+---------+-----------+----------+--------------+ PFV      Full                                                        +---------+---------------+---------+-----------+----------+--------------+ POP      Full           Yes      Yes                                 +---------+---------------+---------+-----------+----------+--------------+ PTV      Full                                                        +---------+---------------+---------+-----------+----------+--------------+ PERO     Full                                                         +---------+---------------+---------+-----------+----------+--------------+   +---------+---------------+---------+-----------+----------+--------------+ LEFT     CompressibilityPhasicitySpontaneityPropertiesThrombus Aging +---------+---------------+---------+-----------+----------+--------------+ CFV      Full           Yes      Yes                                 +---------+---------------+---------+-----------+----------+--------------+ SFJ      Full                                                        +---------+---------------+---------+-----------+----------+--------------+ FV Prox  Full                                                        +---------+---------------+---------+-----------+----------+--------------+  FV Mid   Full                                                        +---------+---------------+---------+-----------+----------+--------------+ FV DistalFull                                                        +---------+---------------+---------+-----------+----------+--------------+ PFV      Full                                                        +---------+---------------+---------+-----------+----------+--------------+ POP      Full           Yes      Yes                                 +---------+---------------+---------+-----------+----------+--------------+ PTV      Full                                                        +---------+---------------+---------+-----------+----------+--------------+ PERO     Full                                                        +---------+---------------+---------+-----------+----------+--------------+     Summary: RIGHT: - There is no evidence of deep vein thrombosis in the lower extremity.  - No cystic structure found in the popliteal fossa.  LEFT: - There is no evidence of deep vein thrombosis in the lower extremity.  - No cystic structure found in the popliteal fossa.   *See table(s) above for measurements and observations.    Preliminary        Jassmine Vandruff T. Pangburn  If 7PM-7AM, please contact night-coverage [email or web address] 05/14/2021, 3:42 PM

## 2021-05-14 NOTE — Progress Notes (Signed)
ANTICOAGULATION CONSULT NOTE  Pharmacy Consult for heparin Indication: pulmonary embolus  No Known Allergies  Patient Measurements: Height: '5\' 2"'$  (157.5 cm) Weight: 61.2 kg (135 lb) IBW/kg (Calculated) : 54.6 Heparin Dosing Weight: 61.2kg  Vital Signs: Temp: 97.6 F (36.4 C) (08/04 0534) Temp Source: Oral (08/04 0534) BP: 122/81 (08/04 0534) Pulse Rate: 56 (08/04 0534)  Labs: Recent Labs    05/13/21 1128 05/13/21 1500 05/13/21 2207 05/14/21 0528  HGB 15.8  --   --  16.0  HCT 46.3  --   --  47.6  PLT 223  --   --  208  HEPARINUNFRC  --   --  0.32 0.36  CREATININE 0.66  --   --   --   TROPONINIHS 6 6  --   --      Estimated Creatinine Clearance: 70.1 mL/min (by C-G formula based on SCr of 0.66 mg/dL).   Medical History: History reviewed. No pertinent past medical history.  Medications:  Infusions:   heparin 1,000 Units/hr (05/13/21 1510)    Assessment: 54 yom presented to the ED with back pain radiating to the chest. Found to have a PE and now starting IV heparin. Baseline CBC is WNL and patient is not on anticoagulation PTA.   05/14/2021: Initial heparin level 0.36-therapeutic on heparin infusion at 1000 units/hr No bleeding or infusion related concerns per RN CBC WNL  Goal of Therapy:  Heparin level 0.3-0.7 units/ml Monitor platelets by anticoagulation protocol: Yes   Plan:  Continue heparin infusion at 1000 units/hr Daily heparin level and CBC while on heparin F/U long-term anticoagulation plan  Netta Cedars PharmD 05/14/2021,6:08 AM

## 2021-05-15 ENCOUNTER — Inpatient Hospital Stay (HOSPITAL_COMMUNITY): Payer: Managed Care, Other (non HMO)

## 2021-05-15 LAB — CBC
HCT: 47.4 % (ref 39.0–52.0)
Hemoglobin: 15.6 g/dL (ref 13.0–17.0)
MCH: 31.5 pg (ref 26.0–34.0)
MCHC: 32.9 g/dL (ref 30.0–36.0)
MCV: 95.6 fL (ref 80.0–100.0)
Platelets: 217 10*3/uL (ref 150–400)
RBC: 4.96 MIL/uL (ref 4.22–5.81)
RDW: 15.6 % — ABNORMAL HIGH (ref 11.5–15.5)
WBC: 6.6 10*3/uL (ref 4.0–10.5)
nRBC: 0 % (ref 0.0–0.2)

## 2021-05-15 LAB — HEPARIN LEVEL (UNFRACTIONATED)
Heparin Unfractionated: 0.15 IU/mL — ABNORMAL LOW (ref 0.30–0.70)
Heparin Unfractionated: 0.37 IU/mL (ref 0.30–0.70)

## 2021-05-15 LAB — PROTIME-INR
INR: 0.9 (ref 0.8–1.2)
Prothrombin Time: 12.1 seconds (ref 11.4–15.2)

## 2021-05-15 LAB — GAMMA GT: GGT: 687 U/L — ABNORMAL HIGH (ref 7–50)

## 2021-05-15 LAB — APTT: aPTT: 64 seconds — ABNORMAL HIGH (ref 24–36)

## 2021-05-15 MED ORDER — GELATIN ABSORBABLE 12-7 MM EX MISC
CUTANEOUS | Status: AC
  Filled 2021-05-15: qty 1

## 2021-05-15 MED ORDER — MIDAZOLAM HCL 2 MG/2ML IJ SOLN
INTRAMUSCULAR | Status: AC
  Filled 2021-05-15: qty 2

## 2021-05-15 MED ORDER — HEPARIN BOLUS VIA INFUSION
3000.0000 [IU] | Freq: Once | INTRAVENOUS | Status: AC
  Administered 2021-05-15: 3000 [IU] via INTRAVENOUS
  Filled 2021-05-15: qty 3000

## 2021-05-15 MED ORDER — HEPARIN (PORCINE) 25000 UT/250ML-% IV SOLN
1150.0000 [IU]/h | INTRAVENOUS | Status: AC
  Administered 2021-05-15: 1100 [IU]/h via INTRAVENOUS
  Filled 2021-05-15 (×2): qty 250

## 2021-05-15 MED ORDER — MIDAZOLAM HCL 2 MG/2ML IJ SOLN
INTRAMUSCULAR | Status: AC | PRN
  Administered 2021-05-15 (×2): 1 mg via INTRAVENOUS

## 2021-05-15 MED ORDER — FENTANYL CITRATE (PF) 100 MCG/2ML IJ SOLN
INTRAMUSCULAR | Status: AC | PRN
  Administered 2021-05-15 (×2): 50 ug via INTRAVENOUS

## 2021-05-15 MED ORDER — LIDOCAINE HCL 1 % IJ SOLN
INTRAMUSCULAR | Status: AC
  Filled 2021-05-15: qty 20

## 2021-05-15 MED ORDER — FENTANYL CITRATE (PF) 100 MCG/2ML IJ SOLN
INTRAMUSCULAR | Status: AC
  Filled 2021-05-15: qty 2

## 2021-05-15 NOTE — Consult Note (Signed)
Chief Complaint: Patient was seen in consultation today for image guided liver lesion biopsy Chief Complaint  Patient presents with   Back Pain    Referring Physician(s): Gonfa,T  Supervising Physician: Markus Daft  Patient Status: [facility] - In-pt  History of Present Illness: Jason Lyons is a 65 y.o. male [place] origin with history of tobacco abuse who presented to [facility] on 8/3 with left-sided chest pain and mid back pain for about a month and subsequently found to have ? right lower lobe subsegmental PE and right hepatic lobe mass as well as T4 lytic lesion with possible soft tissue invasion into the left neuroforamina.  Patient was begun on IV heparin drip.  Preliminary lower extremity venous Doppler was negative for DVT.  CT abdomen pelvis performed yesterday revealed:  1. 5 cm ill-defined irregular low-density lesion in the anterior right liver inferiorly is highly suspicious for primary or metastatic neoplasm. The lesion obliterates bile ducts in the central right liver resulting in right hepatic lobe biliary dilatation. Similarly, portal venous anatomy in the anterior right liver is involved along the superomedial margin of the lesion. Lesion should be amenable to tissue sampling. 2. Gallbladder wall is contracted and irregular in appearance adjacent to the lesion. Imaging features suggest that gallbladder changes are secondary although primary gallbladder neoplasm not excluded. 3. 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 No evidence for additional metastatic disease in the abdomen or pelvis. 4. Prostatomegaly. 5. Trace free fluid in the pelvis. 6. Aortic Atherosclerosis  He is currently afebrile, WBC normal, hemoglobin normal, platelets normal, PT/INR normal, creatinine normal, total bilirubin normal, alk phos 487, AST and ALT elevated, COVID-19 negative.  Request now received from primary care team for image guided liver biopsy for further  evaluation. History reviewed. No pertinent past medical history.  History reviewed. No pertinent surgical history.  Allergies: Patient has no known allergies.  Medications: Prior to Admission medications   Medication Sig Start Date End Date Taking? Authorizing Provider  ibuprofen (ADVIL,MOTRIN) 600 MG tablet Take 1 tablet (600 mg total) by mouth every 6 (six) hours as needed for moderate pain. 08/22/18  Yes Duffy Bruce, MD  nicotine (NICODERM CQ) 21 mg/24hr patch Place 1 patch (21 mg total) onto the skin daily. Patient not taking: No sig reported 04/28/18   Jaynee Eagles, PA-C  ondansetron (ZOFRAN ODT) 4 MG disintegrating tablet Take 1 tablet (4 mg total) by mouth every 8 (eight) hours as needed for nausea or vomiting. 08/22/18   Duffy Bruce, MD  oxyCODONE-acetaminophen (PERCOCET/ROXICET) 5-325 MG tablet Take 1-2 tablets by mouth every 4 (four) hours as needed for moderate pain or severe pain. 08/22/18   Duffy Bruce, MD     Family History  Problem Relation Age of Onset   Cancer Neg Hx     Social History   Socioeconomic History   Marital status: Married    Spouse name: Not on file   Number of children: Not on file   Years of education: Not on file   Highest education level: Not on file  Occupational History   Not on file  Tobacco Use   Smoking status: Every Day    Packs/day: 0.50    Years: 40.00    Pack years: 20.00    Types: Cigarettes    Passive exposure: Current   Smokeless tobacco: Never  Vaping Use   Vaping Use: Former   Substances: Nicotine, Flavoring  Substance and Sexual Activity   Alcohol use:  Yes    Comment: occ   Drug use: Never   Sexual activity: Yes  Other Topics Concern   Not on file  Social History Narrative   Not on file   Social Determinants of Health   Financial Resource Strain: Not on file  Food Insecurity: Not on file  Transportation Needs: Not on file  Physical Activity: Not on file  Stress: Not on file  Social Connections: Not  on file      Review of Systems see above; currently denies fever, headache, respiratory difficulties, nausea, vomiting or bleeding; also denies worsening abdominal pain  Vital Signs: BP 133/85   Pulse 60   Temp (!) 97.5 F (36.4 C) (Oral)   Resp 20   Ht 5' 2"  (1.575 m)   Wt 135 lb (61.2 kg)   SpO2 95%   BMI 24.69 kg/m   Physical Exam awake, alert.  Chest clear to auscultation bilaterally.  Heart with regular rate and rhythm.  Abdomen soft, positive bowel sounds, currently nontender.  No lower extremity edema.  Imaging: CT ABDOMEN PELVIS W WO CONTRAST  Result Date: 05/15/2021 CLINICAL DATA:  Masslike area identified in the liver on recent CTA Chest. EXAM: CT ABDOMEN AND PELVIS WITHOUT AND WITH CONTRAST TECHNIQUE: Multidetector CT imaging of the abdomen and pelvis was performed following the standard protocol before and following the bolus administration of intravenous contrast. CONTRAST:  152m OMNIPAQUE IOHEXOL 350 MG/ML SOLN COMPARISON:  CTA Chest 05/13/2021 FINDINGS: Lower chest: Dependent atelectasis in both lower lobes. Hepatobiliary: 5.0 x 3.3 x 4.5 cm ill-defined irregular low-density lesion is identified in the anterior right liver inferiorly. Arterial phase imaging shows subtle irregular peripheral enhancement with heterogeneous enhancement in the lesion inferiorly. Lesion takes on a more infiltrative appearance at the inferior tip of the liver extending more posteriorly. The mass tracks along the gallbladder fossa towards the fundus in the gallbladder is contracted and irregular in appearance at this location. Focal biliary dilatation is seen in the anterior right liver (segment VIII) into a lesser degree in the posterior right liver (segments VI and VII). There is abrupt cut off of the dilated bile ducts along the medial aspect of the right hepatic lobe lesion. Similarly, portal venous anatomy appears obliterated centrally in the anterior right liver along the superomedial margin of  the lesion. As above, gallbladder appears irregular with diffuse wall thickening. No extrahepatic biliary duct dilatation. Pancreas: No focal mass lesion. No dilatation of the main duct. No intraparenchymal cyst. No peripancreatic edema. Spleen: No splenomegaly. No focal mass lesion. Adrenals/Urinary Tract: No adrenal nodule or mass. Kidneys unremarkable. No evidence for hydroureter. The urinary bladder appears normal for the degree of distention. Stomach/Bowel: Stomach is unremarkable. No gastric wall thickening. No evidence of outlet obstruction. Duodenum is normally positioned as is the ligament of Treitz. No small bowel wall thickening. No small bowel dilatation. The terminal ileum is normal. The appendix is normal. No gross colonic mass. No colonic wall thickening. Vascular/Lymphatic: Insert calcium aorta upper normal lymph nodes are seen in the hepatoduodenal ligament. No retroperitoneal lymphadenopathy No pelvic sidewall lymphadenopathy. Reproductive: Prostate gland is enlarged with heterogeneous enhancement and dystrophic calcification. Other: Trace free fluid is noted in the pelvis. Musculoskeletal: No worrisome lytic or sclerotic osseous abnormality. IMPRESSION: 1. 5 cm ill-defined irregular low-density lesion in the anterior right liver inferiorly is highly suspicious for primary or metastatic neoplasm. The lesion obliterates bile ducts in the central right liver resulting in right hepatic lobe biliary dilatation. Similarly, portal venous anatomy  in the anterior right liver is involved along the superomedial margin of the lesion. Lesion should be amenable to tissue sampling. 2. Gallbladder wall is contracted and irregular in appearance adjacent to the lesion. Imaging features suggest that gallbladder changes are secondary although primary gallbladder neoplasm not excluded. 3. 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 No evidence for additional metastatic disease in the abdomen or pelvis. 4.  Prostatomegaly. 5. Trace free fluid in the pelvis. 6. Aortic Atherosclerosis (ICD10-I70.0). Electronically Signed   By: Misty Stanley M.D.   On: 05/15/2021 05:53   DG Chest 2 View  Result Date: 05/13/2021 CLINICAL DATA:  Chest pain. EXAM: CHEST - 2 VIEW COMPARISON:  No prior. FINDINGS: Mediastinum and hilar structures normal. Low lung volumes with mild bibasilar atelectasis. No pleural effusion or pneumothorax. Heart size normal. Mild thoracic spine scoliosis. No acute bony abnormality. IMPRESSION: Low lung volumes with mild bibasilar atelectasis. Electronically Signed   By: Marcello Moores  Register   On: 05/13/2021 12:04   CT Angio Chest PE W and/or Wo Contrast  Addendum Date: 05/15/2021   ADDENDUM REPORT: 05/15/2021 10:12 ADDENDUM: Case was reviewed with multiple radiologists, subsegmental PE in the right lower lobe is possible, however conclusive evaluation is limited due to respiratory motion artifact. If the presence or absence of subsegmental pulmonary embolus would change management, repeat chest CTA could be considered. Electronically Signed   By: Yetta Glassman MD   On: 05/15/2021 10:12   Result Date: 05/15/2021 CLINICAL DATA:  Shortness of breath EXAM: CT ANGIOGRAPHY CHEST WITH CONTRAST TECHNIQUE: Multidetector CT imaging of the chest was performed using the standard protocol during bolus administration of intravenous contrast. Multiplanar CT image reconstructions and MIPs were obtained to evaluate the vascular anatomy. CONTRAST:  75m OMNIPAQUE IOHEXOL 350 MG/ML SOLN COMPARISON:  None. FINDINGS: Cardiovascular: Adequate contrast opacification of the pulmonary arteries. Subsegmental pulmonary embolus seen in the right lower lobe pulmonary arteries. There is normal heart size. No pericardial effusion. 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 Mediastinum/Nodes: No enlarged mediastinal, hilar, or axillary lymph nodes. Thyroid  gland, trachea, and esophagus demonstrate no significant findings. Lungs/Pleura: Lungs are clear. No pleural effusion or pneumothorax. No Upper Abdomen: Ill-defined masslike area seen in the right lobe of the liver measuring approximately 6.2 x 5.2 cm with adjacent areas of linear low density. Musculoskeletal: Lytic lesion of the T4 vertebral body with possible soft tissue extension into the left neural foramen. No evidence of retropulsion Review of the MIP images confirms the above findings. IMPRESSION: Positive for subsegmental pulmonary embolus in the right lower lobe. Ill-defined masslike area seen in the right lobe of the liver with adjacent intrahepatic biliary ductal dilation. 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 Possible soft tissue extension into the left neural foramen. Finding could be further evaluated with contrast enhanced MRI of the spine. Critical Value/emergent results were called by telephone at the time of interpretation on 05/13/2021 at 2:00 pm to [facility], who verbally acknowledged these results. Electronically Signed: By: LYetta GlassmanMD On: 05/13/2021 14:01   MR CERVICAL SPINE W WO CONTRAST  Result Date: 05/14/2021 CLINICAL DATA:  Metastatic disease evaluation EXAM: MRI CERVICAL, THORACIC AND LUMBAR SPINE WITHOUT AND WITH CONTRAST TECHNIQUE: Multiplanar and multiecho pulse sequences of the cervical spine, to include the craniocervical junction and cervicothoracic junction, and thoracic and lumbar spine, were obtained without and with intravenous contrast. CONTRAST:  34m GADAVIST GADOBUTROL 1 MMOL/ML IV SOLN COMPARISON:  None. FINDINGS: MRI CERVICAL SPINE FINDINGS Alignment: Physiologic. Vertebrae: No fracture, evidence of discitis, or bone lesion. Cord: Normal signal and morphology. Posterior Fossa, vertebral arteries, paraspinal tissues: Negative. Disc levels: C1-2: Unremarkable C2-3: Small central disc protrusion. There is  no spinal canal stenosis. No neural foraminal stenosis. C3-4: Normal disc space and facet joints. There is no spinal canal stenosis. No neural foraminal stenosis. C4-5: Normal disc space and facet joints. There is no spinal canal stenosis. No neural foraminal stenosis. C5-6: Small central disc protrusion. There is no spinal canal stenosis. No neural foraminal stenosis. C6-7: Normal disc space and facet joints. There is no spinal canal stenosis. No neural foraminal stenosis. C7-T1: Normal disc space and facet joints. There is no spinal canal stenosis. No neural foraminal stenosis. MRI THORACIC SPINE FINDINGS Alignment:  Physiologic. Vertebrae: Diffusely abnormal bone marrow signal at T4 with split type fracture and expansion of the left pedicle. There is diffuse abnormal contrast enhancement. Approximately 25% height loss. Cord:  Normal signal and morphology. Paraspinal and other soft tissues: Negative. Disc levels: MRI LUMBAR SPINE FINDINGS Segmentation:  Standard. Alignment:  Physiologic. Vertebrae:  Hemangioma at L1.  No other osseous lesion. Conus medullaris and cauda equina: Conus extends to the L1 level. Conus and cauda equina appear normal. Paraspinal and other soft tissues: Negative Disc levels: L1-L2: Normal disc space and facet joints. No spinal canal stenosis. No neural foraminal stenosis. L2-L3: Normal disc space and facet joints. No spinal canal stenosis. No neural foraminal stenosis. L3-L4: Small disc bulge. No spinal canal stenosis. No neural foraminal stenosis. L4-L5: Small disc bulge with superimposed central protrusion. Left-greater-than-right lateral recess narrowing without central spinal canal stenosis. No neural foraminal stenosis. L5-S1: Left asymmetric disc bulge. Left lateral recess narrowing without central spinal canal stenosis. Moderate left neural foraminal stenosis. Visualized sacrum: Normal. IMPRESSION: 1. Pathologic split type fracture of T4 with approximately 25% height loss, likely  due to metastatic disease. 2. No other evidence of metastatic disease of the cervical, thoracic or lumbar spine. 3. Moderate left L5-S1 neural foraminal stenosis. 4. Left-greater-than-right lateral recess narrowing at L4-5 and L5-S1. Electronically Signed   By: KUlyses JarredM.D.   On: 05/14/2021 19:32   MR THORACIC SPINE W WO CONTRAST  Result Date: 05/14/2021 CLINICAL DATA:  Metastatic disease evaluation EXAM: MRI CERVICAL, THORACIC AND LUMBAR SPINE WITHOUT AND WITH CONTRAST TECHNIQUE: Multiplanar and multiecho pulse sequences of the cervical spine, to include the craniocervical junction and cervicothoracic junction, and thoracic and lumbar spine, were obtained without and with intravenous contrast. CONTRAST:  641mGADAVIST GADOBUTROL 1 MMOL/ML IV SOLN COMPARISON:  None. FINDINGS: MRI CERVICAL SPINE FINDINGS Alignment: Physiologic. Vertebrae: No fracture, evidence of discitis, or bone lesion. Cord: Normal signal and morphology. Posterior Fossa, vertebral arteries, paraspinal tissues: Negative. Disc levels: C1-2: Unremarkable C2-3: Small central disc protrusion. There is no spinal canal stenosis. No neural foraminal stenosis. C3-4: Normal disc space and facet joints. There is no spinal canal stenosis. No neural foraminal stenosis. C4-5: Normal disc space and facet joints. There is no spinal canal stenosis. No neural foraminal stenosis. C5-6: Small central disc protrusion. There is no spinal canal stenosis. No neural foraminal stenosis. C6-7: Normal disc space and facet joints. There is no spinal canal stenosis. No neural foraminal stenosis. C7-T1: Normal disc space and facet joints. There is no spinal canal stenosis. No neural foraminal stenosis. MRI THORACIC SPINE FINDINGS Alignment:  Physiologic. Vertebrae: Diffusely abnormal bone marrow signal at T4 with  split type fracture and expansion of the left pedicle. There is diffuse abnormal contrast enhancement. Approximately 25% height loss. Cord:  Normal signal and  morphology. Paraspinal and other soft tissues: Negative. Disc levels: MRI LUMBAR SPINE FINDINGS Segmentation:  Standard. Alignment:  Physiologic. Vertebrae:  Hemangioma at L1.  No other osseous lesion. Conus medullaris and cauda equina: Conus extends to the L1 level. Conus and cauda equina appear normal. Paraspinal and other soft tissues: Negative Disc levels: L1-L2: Normal disc space and facet joints. No spinal canal stenosis. No neural foraminal stenosis. L2-L3: Normal disc space and facet joints. No spinal canal stenosis. No neural foraminal stenosis. L3-L4: Small disc bulge. No spinal canal stenosis. No neural foraminal stenosis. L4-L5: Small disc bulge with superimposed central protrusion. Left-greater-than-right lateral recess narrowing without central spinal canal stenosis. No neural foraminal stenosis. L5-S1: Left asymmetric disc bulge. Left lateral recess narrowing without central spinal canal stenosis. Moderate left neural foraminal stenosis. Visualized sacrum: Normal. IMPRESSION: 1. Pathologic split type fracture of T4 with approximately 25% height loss, likely due to metastatic disease. 2. No other evidence of metastatic disease of the cervical, thoracic or lumbar spine. 3. Moderate left L5-S1 neural foraminal stenosis. 4. Left-greater-than-right lateral recess narrowing at L4-5 and L5-S1. Electronically Signed   By: Ulyses Jarred M.D.   On: 05/14/2021 19:32   MR Lumbar Spine W Wo Contrast  Result Date: 05/14/2021 CLINICAL DATA:  Metastatic disease evaluation EXAM: MRI CERVICAL, THORACIC AND LUMBAR SPINE WITHOUT AND WITH CONTRAST TECHNIQUE: Multiplanar and multiecho pulse sequences of the cervical spine, to include the craniocervical junction and cervicothoracic junction, and thoracic and lumbar spine, were obtained without and with intravenous contrast. CONTRAST:  2m GADAVIST GADOBUTROL 1 MMOL/ML IV SOLN COMPARISON:  None. FINDINGS: MRI CERVICAL SPINE FINDINGS Alignment: Physiologic. Vertebrae: No  fracture, evidence of discitis, or bone lesion. Cord: Normal signal and morphology. Posterior Fossa, vertebral arteries, paraspinal tissues: Negative. Disc levels: C1-2: Unremarkable C2-3: Small central disc protrusion. There is no spinal canal stenosis. No neural foraminal stenosis. C3-4: Normal disc space and facet joints. There is no spinal canal stenosis. No neural foraminal stenosis. C4-5: Normal disc space and facet joints. There is no spinal canal stenosis. No neural foraminal stenosis. C5-6: Small central disc protrusion. There is no spinal canal stenosis. No neural foraminal stenosis. C6-7: Normal disc space and facet joints. There is no spinal canal stenosis. No neural foraminal stenosis. C7-T1: Normal disc space and facet joints. There is no spinal canal stenosis. No neural foraminal stenosis. MRI THORACIC SPINE FINDINGS Alignment:  Physiologic. Vertebrae: Diffusely abnormal bone marrow signal at T4 with split type fracture and expansion of the left pedicle. There is diffuse abnormal contrast enhancement. Approximately 25% height loss. Cord:  Normal signal and morphology. Paraspinal and other soft tissues: Negative. Disc levels: MRI LUMBAR SPINE FINDINGS Segmentation:  Standard. Alignment:  Physiologic. Vertebrae:  Hemangioma at L1.  No other osseous lesion. Conus medullaris and cauda equina: Conus extends to the L1 level. Conus and cauda equina appear normal. Paraspinal and other soft tissues: Negative Disc levels: L1-L2: Normal disc space and facet joints. No spinal canal stenosis. No neural foraminal stenosis. L2-L3: Normal disc space and facet joints. No spinal canal stenosis. No neural foraminal stenosis. L3-L4: Small disc bulge. No spinal canal stenosis. No neural foraminal stenosis. L4-L5: Small disc bulge with superimposed central protrusion. Left-greater-than-right lateral recess narrowing without central spinal canal stenosis. No neural foraminal stenosis. L5-S1: Left asymmetric disc bulge. Left  lateral recess narrowing without central spinal canal stenosis.  Moderate left neural foraminal stenosis. Visualized sacrum: Normal. IMPRESSION: 1. Pathologic split type fracture of T4 with approximately 25% height loss, likely due to metastatic disease. 2. No other evidence of metastatic disease of the cervical, thoracic or lumbar spine. 3. Moderate left L5-S1 neural foraminal stenosis. 4. Left-greater-than-right lateral recess narrowing at L4-5 and L5-S1. Electronically Signed   By: Ulyses Jarred M.D.   On: 05/14/2021 19:32   VAS [place] LOWER EXTREMITY VENOUS (DVT)  Result Date: 05/14/2021  Lower Venous DVT Study Patient Name:  Jason Lyons  Date of Exam:   05/14/2021 Medical Rec #: [id number]   Accession #:    [id number] Date of Birth: [birth year]  Patient Gender: M Patient Age:   64Y Exam Location:  [facility] Procedure:      VAS [place] LOWER EXTREMITY VENOUS (DVT) Referring Phys: Wendee Beavers --------------------------------------------------------------------------------  Indications: Pulmonary embolism.  Anticoagulation: Heparin. Comparison Study: No prior studies. Performing Technologist: Darlin Coco RDMS,RVT  Examination Guidelines: A complete evaluation includes B-mode imaging, spectral Doppler, color Doppler, and power Doppler as needed of all accessible portions of each vessel. Bilateral testing is considered an integral part of a complete examination. Limited examinations for reoccurring indications may be performed as noted. The reflux portion of the exam is performed with the patient in reverse Trendelenburg.  +---------+---------------+---------+-----------+----------+--------------+ RIGHT    CompressibilityPhasicitySpontaneityPropertiesThrombus Aging +---------+---------------+---------+-----------+----------+--------------+ CFV      Full           Yes      Yes                                 +---------+---------------+---------+-----------+----------+--------------+ SFJ      Full                                                         +---------+---------------+---------+-----------+----------+--------------+ FV Prox  Full                                                        +---------+---------------+---------+-----------+----------+--------------+ FV Mid   Full                                                        +---------+---------------+---------+-----------+----------+--------------+ FV DistalFull                                                        +---------+---------------+---------+-----------+----------+--------------+ PFV      Full                                                        +---------+---------------+---------+-----------+----------+--------------+ POP  Full           Yes      Yes                                 +---------+---------------+---------+-----------+----------+--------------+ PTV      Full                                                        +---------+---------------+---------+-----------+----------+--------------+ PERO     Full                                                        +---------+---------------+---------+-----------+----------+--------------+   +---------+---------------+---------+-----------+----------+--------------+ LEFT     CompressibilityPhasicitySpontaneityPropertiesThrombus Aging +---------+---------------+---------+-----------+----------+--------------+ CFV      Full           Yes      Yes                                 +---------+---------------+---------+-----------+----------+--------------+ SFJ      Full                                                        +---------+---------------+---------+-----------+----------+--------------+ FV Prox  Full                                                        +---------+---------------+---------+-----------+----------+--------------+ FV Mid   Full                                                         +---------+---------------+---------+-----------+----------+--------------+ FV DistalFull                                                        +---------+---------------+---------+-----------+----------+--------------+ PFV      Full                                                        +---------+---------------+---------+-----------+----------+--------------+ POP      Full           Yes      Yes                                 +---------+---------------+---------+-----------+----------+--------------+  PTV      Full                                                        +---------+---------------+---------+-----------+----------+--------------+ PERO     Full                                                        +---------+---------------+---------+-----------+----------+--------------+     Summary: RIGHT: - There is no evidence of deep vein thrombosis in the lower extremity.  - No cystic structure found in the popliteal fossa.  LEFT: - There is no evidence of deep vein thrombosis in the lower extremity.  - No cystic structure found in the popliteal fossa.  *See table(s) above for measurements and observations. Electronically signed by Servando Snare MD on 05/14/2021 at 3:49:43 PM.    Final     Labs:  CBC: Recent Labs    05/13/21 1128 05/14/21 0528 05/15/21 0513  WBC 6.4 7.2 6.6  HGB 15.8 16.0 15.6  HCT 46.3 47.6 47.4  PLT 223 208 217    COAGS: Recent Labs    05/15/21 0833  INR 0.9    BMP: Recent Labs    05/13/21 1128 05/14/21 0528 05/14/21 1628  NA 142 139 139  K 4.2 3.7 3.8  CL 107 104 100  CO2 25 29 32  GLUCOSE 90 131* 74  BUN 14 16 17   CALCIUM 9.2 8.7* 8.8*  CREATININE 0.66 0.76 0.87  GFRNONAA >60 >60 >60    LIVER FUNCTION TESTS: Recent Labs    05/13/21 1500 05/13/21 2327 05/14/21 1628  BILITOT 0.9 0.5 0.6  AST 61* 57* 52*  ALT 85* 83* 75*  ALKPHOS 578* 521* 487*  PROT 7.1 7.2 6.5  ALBUMIN 4.1 3.9 3.5    TUMOR  MARKERS: No results for input(s): AFPTM, CEA, CA199, CHROMGRNA in the last 8760 hours.  Assessment and Plan: 65 y.o. male [place] origin with history of tobacco abuse who presented to [facility] on 8/3 with left-sided chest pain and mid back pain for about a month and subsequently found to have ? right lower lobe subsegmental PE and right hepatic lobe mass as well as T4 lytic lesion with possible soft tissue invasion into the left neuroforamina.  Patient was begun on IV heparin drip.  Preliminary lower extremity venous Doppler was negative for DVT.  CT abdomen pelvis performed yesterday revealed:  1. 5 cm ill-defined irregular low-density lesion in the anterior right liver inferiorly is highly suspicious for primary or metastatic neoplasm. The lesion obliterates bile ducts in the central right liver resulting in right hepatic lobe biliary dilatation. Similarly, portal venous anatomy in the anterior right liver is involved along the superomedial margin of the lesion. Lesion should be amenable to tissue sampling. 2. Gallbladder wall is contracted and irregular in appearance adjacent to the lesion. Imaging features suggest that gallbladder changes are secondary although primary gallbladder neoplasm not excluded. 3. 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 No evidence for additional metastatic disease in the abdomen or pelvis. 4. Prostatomegaly. 5. Trace free fluid in the pelvis. 6. Aortic Atherosclerosis  He is currently afebrile, WBC normal,  hemoglobin normal, platelets normal, PT/INR normal, creatinine normal, total bilirubin normal, alk phos 487, AST and ALT elevated, COVID-19 negative.  Request now received from primary care team for image guided liver biopsy for further evaluation.Imaging studies were reviewed by Dr. Anselm Pancoast. Risks and benefits of procedure  was discussed with the patient via interpreter including, but not limited to bleeding, infection, damage to  adjacent structures or low yield requiring additional tests.  All of the questions were answered and there is agreement to proceed.  Consent signed and in chart.  Procedure scheduled for later today.  IV heparin will be stopped 2 hours prior to case.    Thank you for this interesting consult.  I greatly enjoyed meeting Jason Lyons and look forward to participating in their care.  A copy of this report was sent to the requesting provider on this date.  Electronically Signed: D. Rowe Robert, PA-C 05/15/2021, 11:13 AM   I spent a total of  30 minutes   in face to face in clinical consultation, greater than 50% of which was counseling/coordinating care for image guided liver lesion biopsy

## 2021-05-15 NOTE — Progress Notes (Signed)
Patient's biopsy site is clean, dry and intact. No bleeding noted.

## 2021-05-15 NOTE — Progress Notes (Signed)
ANTICOAGULATION CONSULT NOTE  Pharmacy Consult for heparin Indication: pulmonary embolus  No Known Allergies  Patient Measurements: Height: '5\' 2"'$  (157.5 cm) Weight: 61.2 kg (135 lb) IBW/kg (Calculated) : 54.6 Heparin Dosing Weight: 61.2kg  Vital Signs: Temp: 97.5 F (36.4 C) (08/05 0556) Temp Source: Oral (08/05 0556) BP: 133/85 (08/05 0556) Pulse Rate: 60 (08/05 0556)  Labs: Recent Labs    05/13/21 1128 05/13/21 1500 05/13/21 2207 05/14/21 0528 05/14/21 1628 05/15/21 0513 05/15/21 0833 05/15/21 1227  HGB 15.8  --   --  16.0  --  15.6  --   --   HCT 46.3  --   --  47.6  --  47.4  --   --   PLT 223  --   --  208  --  217  --   --   APTT  --   --   --   --   --   --   --  64*  LABPROT  --   --   --   --   --   --  12.1  --   INR  --   --   --   --   --   --  0.9  --   HEPARINUNFRC  --   --    < > 0.36  --  0.15*  --  0.37  CREATININE 0.66  --   --  0.76 0.87  --   --   --   TROPONINIHS 6 6  --   --   --   --   --   --    < > = values in this interval not displayed.     Estimated Creatinine Clearance: 64.5 mL/min (by C-G formula based on SCr of 0.87 mg/dL).   Medical History: History reviewed. No pertinent past medical history.  Medications:  Infusions:     Assessment: 57 yom presented to the ED with back pain radiating to the chest. Found to have a PE and now starting IV heparin. Baseline CBC is WNL and patient is not on anticoagulation PTA.   05/15/2021: Heparin level 0.37-now therapeutic on heparin infusion at 1100 units/hr No bleeding noted per RN Heparin is topped at 1300 today as [facility] is going [facility].  Heparin to be held 2 hours prior to procedure.   CBC WNL  Goal of Therapy:  Heparin level 0.3-0.7 units/ml Monitor platelets by anticoagulation protocol: Yes   Plan:  Heparin stopped at 1300  F/u restart of heparin after procedure  Daily heparin level and CBC while on heparin F/U long-term anticoagulation plan   Royetta Asal,  PharmD, BCPS 05/15/2021 1:27 PM

## 2021-05-15 NOTE — Progress Notes (Addendum)
Patient's biopsy site is clean, dry, intact. No bleeding noted.

## 2021-05-15 NOTE — Progress Notes (Signed)
ANTICOAGULATION CONSULT NOTE  Pharmacy Consult for heparin Indication: pulmonary embolus  No Known Allergies  Patient Measurements: Height: '5\' 2"'$  (157.5 cm) Weight: 61.2 kg (135 lb) IBW/kg (Calculated) : 54.6 Heparin Dosing Weight: 61.2kg  Vital Signs: Temp: 97.8 F (36.6 C) (08/05 1619) Temp Source: Oral (08/05 1619) BP: 142/97 (08/05 1619) Pulse Rate: 63 (08/05 1619)  Labs: Recent Labs    05/13/21 1128 05/13/21 1500 05/13/21 2207 05/14/21 0528 05/14/21 1628 05/15/21 0513 05/15/21 0833 05/15/21 1227  HGB 15.8  --   --  16.0  --  15.6  --   --   HCT 46.3  --   --  47.6  --  47.4  --   --   PLT 223  --   --  208  --  217  --   --   APTT  --   --   --   --   --   --   --  64*  LABPROT  --   --   --   --   --   --  12.1  --   INR  --   --   --   --   --   --  0.9  --   HEPARINUNFRC  --   --    < > 0.36  --  0.15*  --  0.37  CREATININE 0.66  --   --  0.76 0.87  --   --   --   TROPONINIHS 6 6  --   --   --   --   --   --    < > = values in this interval not displayed.     Estimated Creatinine Clearance: 64.5 mL/min (by C-G formula based on SCr of 0.87 mg/dL).   Medical History: History reviewed. No pertinent past medical history.  Medications:  Infusions:   heparin      Assessment: 2 yom presented to the ED with back pain radiating to the chest. Found to have a PE and now starting IV heparin. Baseline CBC is WNL and patient is not on anticoagulation PTA.   05/15/2021: Heparin level 0.37-now therapeutic on heparin infusion at 1100 units/hr No bleeding noted per RN Heparin stopped at 1300 today 2 hr prior to [facility] for biopsy.  CBC WNL Heparin resumed at 1900, per Radiologist note at 1600, resume Heparin in 3 hr.   Goal of Therapy:  Heparin level 0.3-0.7 units/ml Monitor platelets by anticoagulation protocol: Yes   Plan:  Resume Heparin at 1900 at 1100 units/hr Check 8 hr Heparin level early am 8/6 Daily heparin level and CBC while on heparin F/U long-term  anticoagulation plan  Minda Ditto PharmD [facility] [phone] 05/15/2021, 6:35 PM

## 2021-05-15 NOTE — Progress Notes (Signed)
Patient's biospy site is clean, dry and intact. No bleeding noted.

## 2021-05-15 NOTE — Progress Notes (Signed)
ANTICOAGULATION CONSULT NOTE  Pharmacy Consult for heparin Indication: pulmonary embolus  No Known Allergies  Patient Measurements: Height: '5\' 2"'$  (157.5 cm) Weight: 61.2 kg (135 lb) IBW/kg (Calculated) : 54.6 Heparin Dosing Weight: 61.2kg  Vital Signs: Temp: 97.5 F (36.4 C) (08/05 0556) Temp Source: Oral (08/05 0556) BP: 133/85 (08/05 0556) Pulse Rate: 60 (08/05 0556)  Labs: Recent Labs    05/13/21 1128 05/13/21 1500 05/13/21 2207 05/14/21 0528 05/14/21 1628 05/15/21 0513  HGB 15.8  --   --  16.0  --  15.6  HCT 46.3  --   --  47.6  --  47.4  PLT 223  --   --  208  --  217  HEPARINUNFRC  --   --  0.32 0.36  --  0.15*  CREATININE 0.66  --   --  0.76 0.87  --   TROPONINIHS 6 6  --   --   --   --      Estimated Creatinine Clearance: 64.5 mL/min (by C-G formula based on SCr of 0.87 mg/dL).   Medical History: History reviewed. No pertinent past medical history.  Medications:  Infusions:   heparin 1,000 Units/hr (05/14/21 2138)    Assessment: 51 yom presented to the ED with back pain radiating to the chest. Found to have a PE and now starting IV heparin. Baseline CBC is WNL and patient is not on anticoagulation PTA.   05/15/2021: Heparin level 0.15-now SUBtherapeutic on heparin infusion at 1000 units/hr No bleeding noted per RN Heparin was stopped 3 times yesterday for new bag to be replaced ~1230, for patient to go to MRI ~ 1645 & to go to CT ~2030.  It was resumed at 2138 and has been infusing without interruptions since that time.  CBC WNL  Goal of Therapy:  Heparin level 0.3-0.7 units/ml Monitor platelets by anticoagulation protocol: Yes   Plan:  Heparin 3000 units IV bolus then increase heparin infusion at 1100 units/hr Daily heparin level and CBC while on heparin F/U long-term anticoagulation plan  Netta Cedars PharmD 05/15/2021,6:17 AM

## 2021-05-15 NOTE — Progress Notes (Signed)
Patient's biopsy site is clean, dry, intact. No bleeding noted.

## 2021-05-15 NOTE — Progress Notes (Signed)
PROGRESS NOTE  Jason Lyons [id number] DOB: [birth year]   PCP: Pcp, No  Patient is from: Home.  DOA: 05/13/2021 LOS: 2  Chief complaints:  Chief Complaint  Patient presents with   Back Pain     Brief Narrative / Interim history: 65 year old [place] speaking male with history of tobacco abuse presenting with left-sided chest pain and mid back pain for about a months, and admitted with working diagnosis of RLL subsegmental PE, 6.2 x 5.2 cm right hepatic lobe mass and T4 lytic lesion with possible soft tissue invasion into left neural foramina.  He was a started on heparin drip.  EDP discussed with neurosurgery, Dr. Ardis Hughs who agrees with anticoagulation, medical management, MRI and biopsy but no need for acute NSU intervention.  MRI C/T/L-spine with pathologic split type fracture of T4 with approximately 25% height loss, moderate left L5-S1 neural foraminal stenosis and bilateral lateral recess narrowing at L4-5.   IR consulted the next day and recommended CT abdomen and pelvis which showed previously noted liver lesion obliterating bile duct in central right liver resulting in right hepatic lobe biliary dilation, known T4 lesion and prostamegaly.  Plan for ultrasound guided liver biopsy.  CT angio chest addended as "possible".  Subsegmental RLL PE after review by multiple radiologists.  Oncology consulted.  Subjective: Seen and examined earlier this morning with the help of video interpreter with ID number [id number].  No major events overnight of this morning.  Continues to endorse mid upper back pain and left-sided chest pain.  He rates his back pain as 6/10.  Pain improved with pain medications.  Left-sided chest pain is lateral.  He denies shortness of breath, cough, nausea, vomiting, focal neuro symptoms or bowel or bladder habit change.  Objective: Vitals:   05/14/21 2008 05/15/21 0556 05/15/21 1343 05/15/21 1523  BP: 130/76 133/85 139/90 119/71  Pulse: (!) 58 60 63 61  Resp: '20 20 15  14  '$ Temp: (!) 97.5 F (36.4 C) (!) 97.5 F (36.4 C) 98.7 F (37.1 C)   TempSrc: Oral Oral Oral   SpO2: 97% 95% 100% 100%  Weight:      Height:        Intake/Output Summary (Last 24 hours) at 05/15/2021 1534 Last data filed at 05/14/2021 1800 Gross per 24 hour  Intake 262.17 ml  Output --  Net 262.17 ml   Filed Weights   05/13/21 1150  Weight: 61.2 kg    Examination:  GENERAL: No apparent distress.  Nontoxic. HEENT: MMM.  Vision and hearing grossly intact.  NECK: Supple.  No apparent JVD.  RESP: On RA.  No IWOB.  Fair aeration bilaterally. CVS:  RRR. Heart sounds normal.  ABD/GI/GU: BS+. Abd soft, NTND.  MSK/EXT:  Moves extremities. No apparent deformity. No edema.  No tenderness over spinous processes. SKIN: no apparent skin lesion or wound NEURO: Awake and alert. Oriented appropriately.  No apparent focal neuro deficit. PSYCH: Calm. Normal affect.   Procedures:  None  Microbiology summarized: [id number] and influenza PCR nonreactive.  Assessment & Plan: Possible acute RLL subsegmental PE-does not seem to be symptomatic from this.  His chest pain is on the left and seems to be musculoskeletal.  He is back pain likely due to T4 region.  LE [place] negative for DVT.  Hemodynamically stable.  There is a question if he truly has PE or significant PE to warrant anticoagulation.  Obviously high risk for VTE with malignancy. -Continue IV heparin for now  -Requested heme-onc insight on this.  Right hepatic lobe mass-6.2 x 5.2 cm right hepatic lobe mass noted on CT chest.  Never had colonoscopy. T4 pathologic fracture with 25% height loss-could be the cause of his left-sided chest pain -NS, Dr. Ardis Hughs consulted on admission and did not feel there is any need for acute NS intervention -Plan for ultrasound-guided liver biopsy today -Oncology consulted -Recheck LFT in the morning. -Pain control with as needed oxycodone  Right hepatic lobe biliary dilation-due to bile duct obliteration  by liver mass Elevated liver enzymes/alkaline phosphatase: Likely due to the above.  GGT 687. -Continue monitoring  Elevated BP: No history of hypertension.  Normotensive.  -As needed hydralazine  Tobacco use disorder: Current smoker. -Encourage cessation -Nicotine patch  Language barrier affecting communication -Using video interpreter.  Body mass index is 24.69 kg/m.         DVT prophylaxis:    On IV heparin.  Code Status: Full code Family Communication: Patient and/or RN. Available if any question.  Level of care: Telemetry Status is: Inpatient  Remains inpatient appropriate because:Ongoing diagnostic testing needed not appropriate for outpatient work up, IV treatments appropriate due to intensity of illness or inability to take PO, and Inpatient level of care appropriate due to severity of illness  Dispo: The patient is from: Home              Anticipated d/c is to: Home              Patient currently is not medically stable to d/c.   Difficult to place patient No       Consultants:  Neurosurgery Interventional radiology Oncology   Sch Meds:  Scheduled Meds:  fentaNYL       gelatin adsorbable       lidocaine       midazolam       nicotine  14 mg Transdermal Daily   Continuous Infusions:   PRN Meds:.  Antimicrobials: Anti-infectives (From admission, onward)    None        I have personally reviewed the following labs and images: CBC: Recent Labs  Lab 05/13/21 1128 05/14/21 0528 05/15/21 0513  WBC 6.4 7.2 6.6  HGB 15.8 16.0 15.6  HCT 46.3 47.6 47.4  MCV 91.9 95.8 95.6  PLT 223 208 217   BMP &GFR Recent Labs  Lab 05/13/21 1128 05/14/21 0528 05/14/21 1628  NA 142 139 139  K 4.2 3.7 3.8  CL 107 104 100  CO2 25 29 32  GLUCOSE 90 131* 74  BUN '14 16 17  '$ CREATININE 0.66 0.76 0.87  CALCIUM 9.2 8.7* 8.8*  MG  --   --  2.5*  PHOS  --   --  3.5   Estimated Creatinine Clearance: 64.5 mL/min (by C-G formula based on SCr of 0.87  mg/dL). Liver & Pancreas: Recent Labs  Lab 05/13/21 1500 05/13/21 2327 05/14/21 1628  AST 61* 57* 52*  ALT 85* 83* 75*  ALKPHOS 578* 521* 487*  BILITOT 0.9 0.5 0.6  PROT 7.1 7.2 6.5  ALBUMIN 4.1 3.9 3.5   No results for input(s): LIPASE, AMYLASE in the last 168 hours. No results for input(s): AMMONIA in the last 168 hours. Diabetic: No results for input(s): HGBA1C in the last 72 hours. No results for input(s): GLUCAP in the last 168 hours. Cardiac Enzymes: No results for input(s): CKTOTAL, CKMB, CKMBINDEX, TROPONINI in the last 168 hours. No results for input(s): PROBNP in the last 8760 hours. Coagulation Profile: Recent Labs  Lab 05/15/21 [phone]  INR 0.9   Thyroid Function Tests: No results for input(s): TSH, T4TOTAL, FREET4, T3FREE, THYROIDAB in the last 72 hours. Lipid Profile: No results for input(s): CHOL, HDL, LDLCALC, TRIG, CHOLHDL, LDLDIRECT in the last 72 hours. Anemia Panel: No results for input(s): VITAMINB12, FOLATE, FERRITIN, TIBC, IRON, RETICCTPCT in the last 72 hours. Urine analysis:    Component Value Date/Time   COLORURINE YELLOW 08/22/2018 1125   APPEARANCEUR CLEAR 08/22/2018 1125   LABSPEC 1.003 (L) 08/22/2018 1125   PHURINE 6.0 08/22/2018 1125   GLUCOSEU NEGATIVE 08/22/2018 1125   HGBUR LARGE (A) 08/22/2018 1125   BILIRUBINUR NEGATIVE 08/22/2018 1125   KETONESUR NEGATIVE 08/22/2018 1125   PROTEINUR NEGATIVE 08/22/2018 1125   NITRITE NEGATIVE 08/22/2018 1125   LEUKOCYTESUR NEGATIVE 08/22/2018 1125   Sepsis Labs: Invalid input(s): PROCALCITONIN, [place]  Microbiology: Recent Results (from the past 240 hour(s))  Resp Panel by RT-PCR (Flu A&B, Covid) Nasopharyngeal Swab     Status: None   Collection Time: 05/13/21  3:40 PM   Specimen: Nasopharyngeal Swab; Nasopharyngeal(NP) swabs in vial transport medium  Result Value Ref Range Status   SARS Coronavirus 2 by RT PCR NEGATIVE NEGATIVE Final    Comment: (NOTE) SARS-CoV-2 target nucleic acids  are NOT DETECTED.  The SARS-CoV-2 RNA is generally detectable in upper respiratory specimens during the acute phase of infection. The lowest concentration of SARS-CoV-2 viral copies this assay can detect is 138 copies/mL. A negative result does not preclude SARS-Cov-2 infection and should not be used as the sole basis for treatment or other patient management decisions. A negative result may occur with  improper specimen collection/handling, submission of specimen other than nasopharyngeal swab, presence of viral mutation(s) within the areas targeted by this assay, and inadequate number of viral copies(<138 copies/mL). A negative result must be combined with clinical observations, patient history, and epidemiological information. The expected result is Negative.  Fact Sheet for Patients:  EntrepreneurPulse.com.au  Fact Sheet for Healthcare Providers:  IncredibleEmployment.be  This test is no t yet approved or cleared by the [place] FDA and  has been authorized for detection and/or diagnosis of SARS-CoV-2 by FDA under an Emergency Use Authorization (EUA). This EUA will remain  in effect (meaning this test can be used) for the duration of the COVID-19 declaration under Section 564(b)(1) of the Act, 21 U.S.C.section 360bbb-3(b)(1), unless the authorization is terminated  or revoked sooner.       Influenza A by PCR NEGATIVE NEGATIVE Final   Influenza B by PCR NEGATIVE NEGATIVE Final    Comment: (NOTE) The Xpert Xpress SARS-CoV-2/FLU/RSV plus assay is intended as an aid in the diagnosis of influenza from Nasopharyngeal swab specimens and should not be used as a sole basis for treatment. Nasal washings and aspirates are unacceptable for Xpert Xpress SARS-CoV-2/FLU/RSV testing.  Fact Sheet for Patients: EntrepreneurPulse.com.au  Fact Sheet for Healthcare Providers: IncredibleEmployment.be  This test is  not yet approved or cleared by the [place] FDA and has been authorized for detection and/or diagnosis of SARS-CoV-2 by FDA under an Emergency Use Authorization (EUA). This EUA will remain in effect (meaning this test can be used) for the duration of the COVID-19 declaration under Section 564(b)(1) of the Act, 21 U.S.C. section 360bbb-3(b)(1), unless the authorization is terminated or revoked.  Performed at [facility], [address] [place], [place] [address]     Radiology Studies: CT ABDOMEN PELVIS W WO CONTRAST  Result Date: 05/15/2021 CLINICAL DATA:  Masslike area identified in the liver on recent  CTA Chest. EXAM: CT ABDOMEN AND PELVIS WITHOUT AND WITH CONTRAST TECHNIQUE: Multidetector CT imaging of the abdomen and pelvis was performed following the standard protocol before and following the bolus administration of intravenous contrast. CONTRAST:  194m OMNIPAQUE IOHEXOL 350 MG/ML SOLN COMPARISON:  CTA Chest 05/13/2021 FINDINGS: Lower chest: Dependent atelectasis in both lower lobes. Hepatobiliary: 5.0 x 3.3 x 4.5 cm ill-defined irregular low-density lesion is identified in the anterior right liver inferiorly. Arterial phase imaging shows subtle irregular peripheral enhancement with heterogeneous enhancement in the lesion inferiorly. Lesion takes on a more infiltrative appearance at the inferior tip of the liver extending more posteriorly. The mass tracks along the gallbladder fossa towards the fundus in the gallbladder is contracted and irregular in appearance at this location. Focal biliary dilatation is seen in the anterior right liver (segment VIII) into a lesser degree in the posterior right liver (segments VI and VII). There is abrupt cut off of the dilated bile ducts along the medial aspect of the right hepatic lobe lesion. Similarly, portal venous anatomy appears obliterated centrally in the anterior right liver along the superomedial margin of the lesion. As  above, gallbladder appears irregular with diffuse wall thickening. No extrahepatic biliary duct dilatation. Pancreas: No focal mass lesion. No dilatation of the main duct. No intraparenchymal cyst. No peripancreatic edema. Spleen: No splenomegaly. No focal mass lesion. Adrenals/Urinary Tract: No adrenal nodule or mass. Kidneys unremarkable. No evidence for hydroureter. The urinary bladder appears normal for the degree of distention. Stomach/Bowel: Stomach is unremarkable. No gastric wall thickening. No evidence of outlet obstruction. Duodenum is normally positioned as is the ligament of Treitz. No small bowel wall thickening. No small bowel dilatation. The terminal ileum is normal. The appendix is normal. No gross colonic mass. No colonic wall thickening. Vascular/Lymphatic: Insert calcium aorta upper normal lymph nodes are seen in the hepatoduodenal ligament. No retroperitoneal lymphadenopathy No pelvic sidewall lymphadenopathy. Reproductive: Prostate gland is enlarged with heterogeneous enhancement and dystrophic calcification. Other: Trace free fluid is noted in the pelvis. Musculoskeletal: No worrisome lytic or sclerotic osseous abnormality. IMPRESSION: 1. 5 cm ill-defined irregular low-density lesion in the anterior right liver inferiorly is highly suspicious for primary or metastatic neoplasm. The lesion obliterates bile ducts in the central right liver resulting in right hepatic lobe biliary dilatation. Similarly, portal venous anatomy in the anterior right liver is involved along the superomedial margin of the lesion. Lesion should be amenable to tissue sampling. 2. Gallbladder wall is contracted and irregular in appearance adjacent to the lesion. Imaging features suggest that gallbladder changes are secondary although primary gallbladder neoplasm not excluded. 3. 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 No evidence for additional metastatic disease in the abdomen or pelvis. 4. Prostatomegaly.  5. Trace free fluid in the pelvis. 6. Aortic Atherosclerosis (ICD10-I70.0). Electronically Signed   By: EMisty StanleyM.D.   On: 05/15/2021 05:53   MR CERVICAL SPINE W WO CONTRAST  Result Date: 05/14/2021 CLINICAL DATA:  Metastatic disease evaluation EXAM: MRI CERVICAL, THORACIC AND LUMBAR SPINE WITHOUT AND WITH CONTRAST TECHNIQUE: Multiplanar and multiecho pulse sequences of the cervical spine, to include the craniocervical junction and cervicothoracic junction, and thoracic and lumbar spine, were obtained without and with intravenous contrast. CONTRAST:  643mGADAVIST GADOBUTROL 1 MMOL/ML IV SOLN COMPARISON:  None. FINDINGS: MRI CERVICAL SPINE FINDINGS Alignment: Physiologic. Vertebrae: No fracture, evidence of discitis, or bone lesion. Cord: Normal signal and morphology. Posterior Fossa, vertebral arteries, paraspinal tissues: Negative. Disc levels: C1-2: Unremarkable C2-3: Small central disc  protrusion. There is no spinal canal stenosis. No neural foraminal stenosis. C3-4: Normal disc space and facet joints. There is no spinal canal stenosis. No neural foraminal stenosis. C4-5: Normal disc space and facet joints. There is no spinal canal stenosis. No neural foraminal stenosis. C5-6: Small central disc protrusion. There is no spinal canal stenosis. No neural foraminal stenosis. C6-7: Normal disc space and facet joints. There is no spinal canal stenosis. No neural foraminal stenosis. C7-T1: Normal disc space and facet joints. There is no spinal canal stenosis. No neural foraminal stenosis. MRI THORACIC SPINE FINDINGS Alignment:  Physiologic. Vertebrae: Diffusely abnormal bone marrow signal at T4 with split type fracture and expansion of the left pedicle. There is diffuse abnormal contrast enhancement. Approximately 25% height loss. Cord:  Normal signal and morphology. Paraspinal and other soft tissues: Negative. Disc levels: MRI LUMBAR SPINE FINDINGS Segmentation:  Standard. Alignment:  Physiologic. Vertebrae:   Hemangioma at L1.  No other osseous lesion. Conus medullaris and cauda equina: Conus extends to the L1 level. Conus and cauda equina appear normal. Paraspinal and other soft tissues: Negative Disc levels: L1-L2: Normal disc space and facet joints. No spinal canal stenosis. No neural foraminal stenosis. L2-L3: Normal disc space and facet joints. No spinal canal stenosis. No neural foraminal stenosis. L3-L4: Small disc bulge. No spinal canal stenosis. No neural foraminal stenosis. L4-L5: Small disc bulge with superimposed central protrusion. Left-greater-than-right lateral recess narrowing without central spinal canal stenosis. No neural foraminal stenosis. L5-S1: Left asymmetric disc bulge. Left lateral recess narrowing without central spinal canal stenosis. Moderate left neural foraminal stenosis. Visualized sacrum: Normal. IMPRESSION: 1. Pathologic split type fracture of T4 with approximately 25% height loss, likely due to metastatic disease. 2. No other evidence of metastatic disease of the cervical, thoracic or lumbar spine. 3. Moderate left L5-S1 neural foraminal stenosis. 4. Left-greater-than-right lateral recess narrowing at L4-5 and L5-S1. Electronically Signed   By: Ulyses Jarred M.D.   On: 05/14/2021 19:32   MR THORACIC SPINE W WO CONTRAST  Result Date: 05/14/2021 CLINICAL DATA:  Metastatic disease evaluation EXAM: MRI CERVICAL, THORACIC AND LUMBAR SPINE WITHOUT AND WITH CONTRAST TECHNIQUE: Multiplanar and multiecho pulse sequences of the cervical spine, to include the craniocervical junction and cervicothoracic junction, and thoracic and lumbar spine, were obtained without and with intravenous contrast. CONTRAST:  61m GADAVIST GADOBUTROL 1 MMOL/ML IV SOLN COMPARISON:  None. FINDINGS: MRI CERVICAL SPINE FINDINGS Alignment: Physiologic. Vertebrae: No fracture, evidence of discitis, or bone lesion. Cord: Normal signal and morphology. Posterior Fossa, vertebral arteries, paraspinal tissues: Negative. Disc  levels: C1-2: Unremarkable C2-3: Small central disc protrusion. There is no spinal canal stenosis. No neural foraminal stenosis. C3-4: Normal disc space and facet joints. There is no spinal canal stenosis. No neural foraminal stenosis. C4-5: Normal disc space and facet joints. There is no spinal canal stenosis. No neural foraminal stenosis. C5-6: Small central disc protrusion. There is no spinal canal stenosis. No neural foraminal stenosis. C6-7: Normal disc space and facet joints. There is no spinal canal stenosis. No neural foraminal stenosis. C7-T1: Normal disc space and facet joints. There is no spinal canal stenosis. No neural foraminal stenosis. MRI THORACIC SPINE FINDINGS Alignment:  Physiologic. Vertebrae: Diffusely abnormal bone marrow signal at T4 with split type fracture and expansion of the left pedicle. There is diffuse abnormal contrast enhancement. Approximately 25% height loss. Cord:  Normal signal and morphology. Paraspinal and other soft tissues: Negative. Disc levels: MRI LUMBAR SPINE FINDINGS Segmentation:  Standard. Alignment:  Physiologic. Vertebrae:  Hemangioma  at L1.  No other osseous lesion. Conus medullaris and cauda equina: Conus extends to the L1 level. Conus and cauda equina appear normal. Paraspinal and other soft tissues: Negative Disc levels: L1-L2: Normal disc space and facet joints. No spinal canal stenosis. No neural foraminal stenosis. L2-L3: Normal disc space and facet joints. No spinal canal stenosis. No neural foraminal stenosis. L3-L4: Small disc bulge. No spinal canal stenosis. No neural foraminal stenosis. L4-L5: Small disc bulge with superimposed central protrusion. Left-greater-than-right lateral recess narrowing without central spinal canal stenosis. No neural foraminal stenosis. L5-S1: Left asymmetric disc bulge. Left lateral recess narrowing without central spinal canal stenosis. Moderate left neural foraminal stenosis. Visualized sacrum: Normal. IMPRESSION: 1.  Pathologic split type fracture of T4 with approximately 25% height loss, likely due to metastatic disease. 2. No other evidence of metastatic disease of the cervical, thoracic or lumbar spine. 3. Moderate left L5-S1 neural foraminal stenosis. 4. Left-greater-than-right lateral recess narrowing at L4-5 and L5-S1. Electronically Signed   By: Ulyses Jarred M.D.   On: 05/14/2021 19:32   MR Lumbar Spine W Wo Contrast  Result Date: 05/14/2021 CLINICAL DATA:  Metastatic disease evaluation EXAM: MRI CERVICAL, THORACIC AND LUMBAR SPINE WITHOUT AND WITH CONTRAST TECHNIQUE: Multiplanar and multiecho pulse sequences of the cervical spine, to include the craniocervical junction and cervicothoracic junction, and thoracic and lumbar spine, were obtained without and with intravenous contrast. CONTRAST:  58m GADAVIST GADOBUTROL 1 MMOL/ML IV SOLN COMPARISON:  None. FINDINGS: MRI CERVICAL SPINE FINDINGS Alignment: Physiologic. Vertebrae: No fracture, evidence of discitis, or bone lesion. Cord: Normal signal and morphology. Posterior Fossa, vertebral arteries, paraspinal tissues: Negative. Disc levels: C1-2: Unremarkable C2-3: Small central disc protrusion. There is no spinal canal stenosis. No neural foraminal stenosis. C3-4: Normal disc space and facet joints. There is no spinal canal stenosis. No neural foraminal stenosis. C4-5: Normal disc space and facet joints. There is no spinal canal stenosis. No neural foraminal stenosis. C5-6: Small central disc protrusion. There is no spinal canal stenosis. No neural foraminal stenosis. C6-7: Normal disc space and facet joints. There is no spinal canal stenosis. No neural foraminal stenosis. C7-T1: Normal disc space and facet joints. There is no spinal canal stenosis. No neural foraminal stenosis. MRI THORACIC SPINE FINDINGS Alignment:  Physiologic. Vertebrae: Diffusely abnormal bone marrow signal at T4 with split type fracture and expansion of the left pedicle. There is diffuse abnormal  contrast enhancement. Approximately 25% height loss. Cord:  Normal signal and morphology. Paraspinal and other soft tissues: Negative. Disc levels: MRI LUMBAR SPINE FINDINGS Segmentation:  Standard. Alignment:  Physiologic. Vertebrae:  Hemangioma at L1.  No other osseous lesion. Conus medullaris and cauda equina: Conus extends to the L1 level. Conus and cauda equina appear normal. Paraspinal and other soft tissues: Negative Disc levels: L1-L2: Normal disc space and facet joints. No spinal canal stenosis. No neural foraminal stenosis. L2-L3: Normal disc space and facet joints. No spinal canal stenosis. No neural foraminal stenosis. L3-L4: Small disc bulge. No spinal canal stenosis. No neural foraminal stenosis. L4-L5: Small disc bulge with superimposed central protrusion. Left-greater-than-right lateral recess narrowing without central spinal canal stenosis. No neural foraminal stenosis. L5-S1: Left asymmetric disc bulge. Left lateral recess narrowing without central spinal canal stenosis. Moderate left neural foraminal stenosis. Visualized sacrum: Normal. IMPRESSION: 1. Pathologic split type fracture of T4 with approximately 25% height loss, likely due to metastatic disease. 2. No other evidence of metastatic disease of the cervical, thoracic or lumbar spine. 3. Moderate left L5-S1 neural foraminal stenosis.  4. Left-greater-than-right lateral recess narrowing at L4-5 and L5-S1. Electronically Signed   By: Ulyses Jarred M.D.   On: 05/14/2021 19:32       Jason Lyons T. [place]  If 7PM-7AM, please contact night-coverage [email or web address] 05/15/2021, 3:34 PM

## 2021-05-15 NOTE — Procedures (Signed)
Interventional Radiology Procedure:   Indications: Right hepatic lesion  Procedure: US guided liver lesion biopsy  Findings: 3 core biopsies from hypoechoic right hepatic lesion.  Complications: No immediate complications noted.     EBL: Minimal  Plan: Bedrest 4 hours.   May resume heparin in 3 hours.    Aahil Fredin R. Anselm Pancoast, MD  Pager: [phone]

## 2021-05-16 ENCOUNTER — Inpatient Hospital Stay (HOSPITAL_COMMUNITY): Payer: Managed Care, Other (non HMO)

## 2021-05-16 ENCOUNTER — Encounter (HOSPITAL_COMMUNITY): Payer: Self-pay | Admitting: Internal Medicine

## 2021-05-16 DIAGNOSIS — R160 Hepatomegaly, not elsewhere classified: Secondary | ICD-10-CM

## 2021-05-16 DIAGNOSIS — M8458XA Pathological fracture in neoplastic disease, other specified site, initial encounter for fracture: Secondary | ICD-10-CM | POA: Diagnosis present

## 2021-05-16 DIAGNOSIS — K5909 Other constipation: Secondary | ICD-10-CM | POA: Diagnosis not present

## 2021-05-16 DIAGNOSIS — C7951 Secondary malignant neoplasm of bone: Secondary | ICD-10-CM | POA: Diagnosis not present

## 2021-05-16 LAB — AFP TUMOR MARKER: AFP, Serum, Tumor Marker: 8.5 ng/mL — ABNORMAL HIGH (ref 0.0–8.4)

## 2021-05-16 LAB — COMPREHENSIVE METABOLIC PANEL
ALT: 72 U/L — ABNORMAL HIGH (ref 0–44)
AST: 52 U/L — ABNORMAL HIGH (ref 15–41)
Albumin: 3.3 g/dL — ABNORMAL LOW (ref 3.5–5.0)
Alkaline Phosphatase: 448 U/L — ABNORMAL HIGH (ref 38–126)
Anion gap: 9 (ref 5–15)
BUN: 20 mg/dL (ref 8–23)
CO2: 25 mmol/L (ref 22–32)
Calcium: 8.4 mg/dL — ABNORMAL LOW (ref 8.9–10.3)
Chloride: 104 mmol/L (ref 98–111)
Creatinine, Ser: 0.75 mg/dL (ref 0.61–1.24)
GFR, Estimated: >60 mL/min (ref >60)
Glucose, Bld: 138 mg/dL — ABNORMAL HIGH (ref 70–99)
Potassium: 3.8 mmol/L (ref 3.5–5.1)
Sodium: 138 mmol/L (ref 135–145)
Total Bilirubin: 0.6 mg/dL (ref 0.3–1.2)
Total Protein: 6.2 g/dL — ABNORMAL LOW (ref 6.5–8.1)

## 2021-05-16 LAB — CBC
HCT: 44.9 % (ref 39.0–52.0)
Hemoglobin: 14.8 g/dL (ref 13.0–17.0)
MCH: 31.4 pg (ref 26.0–34.0)
MCHC: 33 g/dL (ref 30.0–36.0)
MCV: 95.1 fL (ref 80.0–100.0)
Platelets: 220 10*3/uL (ref 150–400)
RBC: 4.72 MIL/uL (ref 4.22–5.81)
RDW: 15.3 % (ref 11.5–15.5)
WBC: 6.9 10*3/uL (ref 4.0–10.5)
nRBC: 0 % (ref 0.0–0.2)

## 2021-05-16 LAB — CEA: CEA: 9.1 ng/mL — ABNORMAL HIGH (ref 0.0–4.7)

## 2021-05-16 LAB — HEPARIN LEVEL (UNFRACTIONATED)
Heparin Unfractionated: 0.3 IU/mL (ref 0.30–0.70)
Heparin Unfractionated: 0.44 IU/mL (ref 0.30–0.70)

## 2021-05-16 LAB — CANCER ANTIGEN 19-9: CA 19-9: 2631 U/mL — ABNORMAL HIGH (ref 0–35)

## 2021-05-16 LAB — MAGNESIUM: Magnesium: 2.4 mg/dL (ref 1.7–2.4)

## 2021-05-16 LAB — PHOSPHORUS: Phosphorus: 3.2 mg/dL (ref 2.5–4.6)

## 2021-05-16 MED ORDER — SENNOSIDES-DOCUSATE SODIUM 8.6-50 MG PO TABS
1.0000 | ORAL_TABLET | Freq: Two times a day (BID) | ORAL | Status: DC
  Administered 2021-05-16 – 2021-05-19 (×7): 1 via ORAL
  Filled 2021-05-16 (×7): qty 1

## 2021-05-16 MED ORDER — IOHEXOL 350 MG/ML SOLN
75.0000 mL | Freq: Once | INTRAVENOUS | Status: AC | PRN
  Administered 2021-05-16: 75 mL via INTRAVENOUS

## 2021-05-16 MED ORDER — ENOXAPARIN SODIUM 60 MG/0.6ML IJ SOSY
60.0000 mg | PREFILLED_SYRINGE | Freq: Two times a day (BID) | INTRAMUSCULAR | Status: DC
  Administered 2021-05-16 – 2021-05-17 (×2): 60 mg via SUBCUTANEOUS
  Filled 2021-05-16 (×3): qty 0.6

## 2021-05-16 MED ORDER — SODIUM CHLORIDE 0.45 % IV SOLN: INTRAVENOUS | Status: DC

## 2021-05-16 MED ORDER — GADOBUTROL 1 MMOL/ML IV SOLN
6.0000 mL | Freq: Once | INTRAVENOUS | Status: AC | PRN
  Administered 2021-05-16: 6 mL via INTRAVENOUS

## 2021-05-16 MED ORDER — DEXAMETHASONE 4 MG PO TABS
4.0000 mg | ORAL_TABLET | Freq: Three times a day (TID) | ORAL | Status: DC
  Administered 2021-05-16 – 2021-05-19 (×11): 4 mg via ORAL
  Filled 2021-05-16 (×11): qty 1

## 2021-05-16 NOTE — Progress Notes (Signed)
ANTICOAGULATION CONSULT NOTE  Pharmacy Consult for heparin Indication: pulmonary embolus  No Known Allergies  Patient Measurements: Height: '5\' 2"'$  (157.5 cm) Weight: 61.2 kg (135 lb) IBW/kg (Calculated) : 54.6 Heparin Dosing Weight: 61.2kg  Vital Signs: Temp: 97.7 F (36.5 C) (08/05 2035) Temp Source: Oral (08/05 2035) BP: 136/79 (08/05 2035) Pulse Rate: 68 (08/05 2035)  Labs: Recent Labs    05/13/21 1128 05/13/21 1500 05/13/21 2207 05/14/21 0528 05/14/21 1628 05/15/21 0513 05/15/21 0833 05/15/21 1227 05/16/21 0240  HGB 15.8  --   --  16.0  --  15.6  --   --  14.8  HCT 46.3  --   --  47.6  --  47.4  --   --  44.9  PLT 223  --   --  208  --  217  --   --  220  APTT  --   --   --   --   --   --   --  64*  --   LABPROT  --   --   --   --   --   --  12.1  --   --   INR  --   --   --   --   --   --  0.9  --   --   HEPARINUNFRC  --   --    < > 0.36  --  0.15*  --  0.37 0.30  CREATININE 0.66  --   --  0.76 0.87  --   --   --  0.75  TROPONINIHS 6 6  --   --   --   --   --   --   --    < > = values in this interval not displayed.     Estimated Creatinine Clearance: 70.1 mL/min (by C-G formula based on SCr of 0.75 mg/dL).   Medical History: History reviewed. No pertinent past medical history.  Medications:  Infusions:   heparin 1,100 Units/hr (05/15/21 1854)    Assessment: 59 yom presented to the ED with back pain radiating to the chest. Found to have a PE and now starting IV heparin. Baseline CBC is WNL and patient is not on anticoagulation PTA.   05/16/2021: Heparin level 0.3-remains at lower end of therapeutic range on heparin infusion at 1100 units/hr No bleeding noted per RN CBC WNL No infusion interruptions since Heparin resumed at 1900  Goal of Therapy:  Heparin level 0.3-0.7 units/ml Monitor platelets by anticoagulation protocol: Yes   Plan:  Increase Heparin to 1150 units/hr Recheck 6hr Heparin level to confirm therapeutic range Daily heparin level  and CBC while on heparin F/U long-term anticoagulation plan  Netta Cedars PharmD [facility] [phone] 05/16/2021, 3:42 AM

## 2021-05-16 NOTE — Progress Notes (Signed)
PROGRESS NOTE  Jason Lyons [id number] DOB: [birth year]   PCP: Pcp, No  Patient is from: Home.  DOA: 05/13/2021 LOS: 3  Chief complaints:  Chief Complaint  Patient presents with   Back Pain     Brief Narrative / Interim history: 65 year old [place] speaking male with history of tobacco abuse presenting with left-sided chest pain and mid back pain for about a months, and admitted with working diagnosis of RLL subsegmental PE, 6.2 x 5.2 cm right hepatic lobe mass and T4 lytic lesion with possible soft tissue invasion into left neural foramina.  He was a started on heparin drip.  EDP discussed with neurosurgery, Dr. Ardis Hughs who agrees with anticoagulation, medical management, MRI and biopsy but no need for acute NSU intervention.  MRI C/T/L-spine with pathologic split type fracture of T4 with approximately 25% height loss, moderate left L5-S1 neural foraminal stenosis and bilateral lateral recess narrowing at L4-5.   IR consulted the next day and recommended CT abdomen and pelvis which showed previously noted liver lesion obliterating bile duct in central right liver resulting in right hepatic lobe biliary dilation, known T4 lesion and prostamegaly.  Underwent ultrasound-guided liver biopsy on 8/5.  Oncology consulted.  MRI brain ordered.   Subjective: Seen and examined earlier this afternoon.  No major events overnight of this morning.  Still with some back pain and left-sided chest pain.  Rates his pain 6-7 on a scale of 10.  No other complaints.  Oncology started a steroid for pain.   Objective: Vitals:   05/15/21 1619 05/15/21 2035 05/16/21 0359 05/16/21 1456  BP: (!) 142/97 136/79 137/86 (!) 171/76  Pulse: 63 68 (!) 59 65  Resp: '15 20 20 18  '$ Temp: 97.8 F (36.6 C) 97.7 F (36.5 C) (!) 97.4 F (36.3 C) 97.9 F (36.6 C)  TempSrc: Oral Oral Oral   SpO2: 97% 96% 98% 99%  Weight:      Height:        Intake/Output Summary (Last 24 hours) at 05/16/2021 1620 Last data filed at  05/16/2021 1500 Gross per 24 hour  Intake 516.3 ml  Output --  Net 516.3 ml   Filed Weights   05/13/21 1150  Weight: 61.2 kg    Examination:  GENERAL: No apparent distress.  Nontoxic. HEENT: MMM.  Vision and hearing grossly intact.  NECK: Supple.  No apparent JVD.  RESP:  No IWOB.  Fair aeration bilaterally. CVS:  RRR. Heart sounds normal.  ABD/GI/GU: BS+. Abd soft, NTND.  MSK/EXT:  Moves extremities. No apparent deformity. No edema.  SKIN: no apparent skin lesion or wound NEURO: Awake and alert. Oriented appropriately.  No apparent focal neuro deficit. PSYCH: Calm. Normal affect.   Procedures:  8/5-ultrasound-guided liver biopsy by IR  Microbiology summarized: COVID-19 and influenza PCR nonreactive.  Assessment & Plan: Acute RLL subsegmental PE-initial CTA chest was nonconclusive.  Repeat CTA chest confirmed this.  Does not seem to be symptomatic from this.  His chest pain is on the left and seems to be musculoskeletal.  He is back pain likely due to T4 region.  LE [place] negative for DVT.  Hemodynamically stable.  Although his PE seems to be small, he is at high risk for VTE due to malignancy. -Continue IV heparin for now  -Defer anticoagulation choice to hematology  Right hepatic lobe mass-6.2 x 5.2 cm right hepatic lobe mass noted on CT chest.  Never had colonoscopy. T4 pathologic fracture with 25% height loss-could be the cause of his left-sided chest  pain -CEA elevated.  AFP on the upper limit of normal. -NS, Dr. Ardis Hughs consulted on admission and did not feel there is any need for acute NS intervention -S/p US guided liver biopsy by IR on 8/5 -Appreciate oncology recommendations  -MRI brain with or without contrast  -Dexamethasone 4 mg every 8 hours  -Reevaluate in the morning -Continue as needed Tylenol and oxycodone  Right hepatic lobe biliary dilation-due to bile duct obliteration by liver mass Elevated liver enzymes/alkaline phosphatase: Likely due to the above.   Stable.  GGT 687. -Continue monitoring  Elevated BP: No history of hypertension.  Normotensive.  -As needed hydralazine  Tobacco use disorder: Current smoker. -Encourage cessation -Nicotine patch  Language barrier affecting communication -Patient's son assisted with interpretation per patient's choice.  Body mass index is 24.69 kg/m.         DVT prophylaxis:    On IV heparin.  Code Status: Full code Family Communication: Patient and/or RN.  Updated patient's son at bedside. Level of care: Telemetry Status is: Inpatient  Remains inpatient appropriate because:Ongoing diagnostic testing needed not appropriate for outpatient work up and Inpatient level of care appropriate due to severity of illness  Dispo: The patient is from: Home              Anticipated d/c is to: Home              Patient currently is not medically stable to d/c.   Difficult to place patient No       Consultants:  Neurosurgery over the phone by EDP Interventional radiology Oncology   Sch Meds:  Scheduled Meds:  dexamethasone  4 mg Oral Q8H   nicotine  14 mg Transdermal Daily   senna-docusate  1 tablet Oral BID   Continuous Infusions:  sodium chloride 75 mL/hr at 05/16/21 0926   heparin 1,150 Units/hr (05/16/21 0434)    PRN Meds:.  Antimicrobials: Anti-infectives (From admission, onward)    None        I have personally reviewed the following labs and images: CBC: Recent Labs  Lab 05/13/21 1128 05/14/21 0528 05/15/21 0513 05/16/21 0240  WBC 6.4 7.2 6.6 6.9  HGB 15.8 16.0 15.6 14.8  HCT 46.3 47.6 47.4 44.9  MCV 91.9 95.8 95.6 95.1  PLT 223 208 217 220   BMP &GFR Recent Labs  Lab 05/13/21 1128 05/14/21 0528 05/14/21 1628 05/16/21 0240  NA 142 139 139 138  K 4.2 3.7 3.8 3.8  CL 107 104 100 104  CO2 25 29 32 25  GLUCOSE 90 131* 74 138*  BUN '14 16 17 20  '$ CREATININE 0.66 0.76 0.87 0.75  CALCIUM 9.2 8.7* 8.8* 8.4*  MG  --   --  2.5* 2.4  PHOS  --   --  3.5 3.2    Estimated Creatinine Clearance: 70.1 mL/min (by C-G formula based on SCr of 0.75 mg/dL). Liver & Pancreas: Recent Labs  Lab 05/13/21 1500 05/13/21 2327 05/14/21 1628 05/16/21 0240  AST 61* 57* 52* 52*  ALT 85* 83* 75* 72*  ALKPHOS 578* 521* 487* 448*  BILITOT 0.9 0.5 0.6 0.6  PROT 7.1 7.2 6.5 6.2*  ALBUMIN 4.1 3.9 3.5 3.3*   No results for input(s): LIPASE, AMYLASE in the last 168 hours. No results for input(s): AMMONIA in the last 168 hours. Diabetic: No results for input(s): HGBA1C in the last 72 hours. No results for input(s): GLUCAP in the last 168 hours. Cardiac Enzymes: No results for input(s): CKTOTAL, CKMB,  CKMBINDEX, TROPONINI in the last 168 hours. No results for input(s): PROBNP in the last 8760 hours. Coagulation Profile: Recent Labs  Lab 05/15/21 0833  INR 0.9   Thyroid Function Tests: No results for input(s): TSH, T4TOTAL, FREET4, T3FREE, THYROIDAB in the last 72 hours. Lipid Profile: No results for input(s): CHOL, HDL, LDLCALC, TRIG, CHOLHDL, LDLDIRECT in the last 72 hours. Anemia Panel: No results for input(s): VITAMINB12, FOLATE, FERRITIN, TIBC, IRON, RETICCTPCT in the last 72 hours. Urine analysis:    Component Value Date/Time   COLORURINE YELLOW 08/22/2018 1125   APPEARANCEUR CLEAR 08/22/2018 1125   LABSPEC 1.003 (L) 08/22/2018 1125   PHURINE 6.0 08/22/2018 1125   GLUCOSEU NEGATIVE 08/22/2018 1125   HGBUR LARGE (A) 08/22/2018 1125   BILIRUBINUR NEGATIVE 08/22/2018 1125   KETONESUR NEGATIVE 08/22/2018 1125   PROTEINUR NEGATIVE 08/22/2018 1125   NITRITE NEGATIVE 08/22/2018 1125   LEUKOCYTESUR NEGATIVE 08/22/2018 1125   Sepsis Labs: Invalid input(s): PROCALCITONIN, [place]  Microbiology: Recent Results (from the past 240 hour(s))  Resp Panel by RT-PCR (Flu A&B, Covid) Nasopharyngeal Swab     Status: None   Collection Time: 05/13/21  3:40 PM   Specimen: Nasopharyngeal Swab; Nasopharyngeal(NP) swabs in vial transport medium  Result Value  Ref Range Status   SARS Coronavirus 2 by RT PCR NEGATIVE NEGATIVE Final    Comment: (NOTE) SARS-CoV-2 target nucleic acids are NOT DETECTED.  The SARS-CoV-2 RNA is generally detectable in upper respiratory specimens during the acute phase of infection. The lowest concentration of SARS-CoV-2 viral copies this assay can detect is 138 copies/mL. A negative result does not preclude SARS-Cov-2 infection and should not be used as the sole basis for treatment or other patient management decisions. A negative result may occur with  improper specimen collection/handling, submission of specimen other than nasopharyngeal swab, presence of viral mutation(s) within the areas targeted by this assay, and inadequate number of viral copies(<138 copies/mL). A negative result must be combined with clinical observations, patient history, and epidemiological information. The expected result is Negative.  Fact Sheet for Patients:  EntrepreneurPulse.com.au  Fact Sheet for Healthcare Providers:  IncredibleEmployment.be  This test is no t yet approved or cleared by the [place] FDA and  has been authorized for detection and/or diagnosis of SARS-CoV-2 by FDA under an Emergency Use Authorization (EUA). This EUA will remain  in effect (meaning this test can be used) for the duration of the COVID-19 declaration under Section 564(b)(1) of the Act, 21 U.S.C.section 360bbb-3(b)(1), unless the authorization is terminated  or revoked sooner.       Influenza A by PCR NEGATIVE NEGATIVE Final   Influenza B by PCR NEGATIVE NEGATIVE Final    Comment: (NOTE) The Xpert Xpress SARS-CoV-2/FLU/RSV plus assay is intended as an aid in the diagnosis of influenza from Nasopharyngeal swab specimens and should not be used as a sole basis for treatment. Nasal washings and aspirates are unacceptable for Xpert Xpress SARS-CoV-2/FLU/RSV testing.  Fact Sheet for  Patients: EntrepreneurPulse.com.au  Fact Sheet for Healthcare Providers: IncredibleEmployment.be  This test is not yet approved or cleared by the [place] FDA and has been authorized for detection and/or diagnosis of SARS-CoV-2 by FDA under an Emergency Use Authorization (EUA). This EUA will remain in effect (meaning this test can be used) for the duration of the COVID-19 declaration under Section 564(b)(1) of the Act, 21 U.S.C. section 360bbb-3(b)(1), unless the authorization is terminated or revoked.  Performed at [facility], [address] [place], [place] [address]  Radiology Studies: CT Angio Chest Pulmonary Embolism (PE) W or WO Contrast  Result Date: 05/16/2021 CLINICAL DATA:  65 year old male with clinical concern for pulmonary embolism. Follow-up study. EXAM: CT ANGIOGRAPHY CHEST WITH CONTRAST TECHNIQUE: Multidetector CT imaging of the chest was performed using the standard protocol during bolus administration of intravenous contrast. Multiplanar CT image reconstructions and MIPs were obtained to evaluate the vascular anatomy. CONTRAST:  75m OMNIPAQUE IOHEXOL 350 MG/ML SOLN COMPARISON:  Chest CTA 05/13/2021. FINDINGS: Cardiovascular: Again noted are small subsegmental sized filling defects in pulmonary artery branches in the right lower lobe best appreciated on axial images 169-178 of series 5. No other central, lobar or segmental sized filling defects are noted elsewhere in the pulmonary arterial tree. Heart size is normal. There is no significant pericardial fluid, thickening or pericardial calcification. There is aortic atherosclerosis, as well as atherosclerosis of the great vessels of the mediastinum and the coronary arteries, including calcified atherosclerotic plaque in the left main, left anterior descending and left circumflex coronary arteries. Ectasia of ascending thoracic aorta (4.0 cm in diameter).  Mediastinum/Nodes: No pathologically enlarged mediastinal or hilar lymph nodes. Esophagus is unremarkable in appearance. No axillary lymphadenopathy. Lungs/Pleura: Dependent areas of subsegmental atelectasis are noted in the lower lobes of the lungs bilaterally. No acute consolidative airspace disease. No pleural effusions. 3 mm pulmonary nodule in the apex of the left upper lobe (axial image 28 of series 6). No other larger more suspicious appearing pulmonary nodules or masses are noted. Upper Abdomen: Please see dictation for CT of the abdomen and pelvis 05/14/2021 for full description of findings below the diaphragm, which includes a large liver mass. Musculoskeletal: Lytic lesion in L4 with pathologic fracture resulting in 25% loss of posterior vertebral body height and 4 mm of retropulsion of fracture fragments impinging upon the central spinal canal. Review of the MIP images confirms the above findings. IMPRESSION: 1. Study is once again positive for subsegmental sized emboli in the right lower lobe. No larger central, lobar or segmental sized emboli are noted. 2. Lytic lesion in T4 with pathologic fracture, similar to the prior study. This was better depicted on recent thoracic spine MRI. 3. Aortic atherosclerosis, in addition to left main and 3 vessel coronary artery disease. There is also mild ectasia of the ascending thoracic aorta (4.0 cm in diameter). Recommend annual imaging followup by CTA or MRA. This recommendation follows 2010 ACCF/AHA/AATS/ACR/ASA/SCA/SCAI/SIR/STS/SVM Guidelines for the Diagnosis and Management of Patients with Thoracic Aortic Disease. Circulation. 2010; [id number] Aortic aneurysm NOS (ICD10-I71.9). Aortic Atherosclerosis (ICD10-I70.0). Electronically Signed   By: DVinnie LangtonM.D.   On: 05/16/2021 10:24       Donovan Persley T. GArmona If 7PM-7AM, please contact night-coverage [email or web address] 05/16/2021, 4:20 PM

## 2021-05-16 NOTE — Progress Notes (Addendum)
ANTICOAGULATION CONSULT NOTE - Follow Up Consult  Pharmacy Consult for heparin Indication: acute pulmonary embolus  No Known Allergies  Patient Measurements: Height: '5\' 2"'$  (157.5 cm) Weight: 61.2 kg (135 lb) IBW/kg (Calculated) : 54.6 Heparin Dosing Weight: 61 kg  Vital Signs: Temp: 97.4 F (36.3 C) (08/06 0359) Temp Source: Oral (08/06 0359) BP: 137/86 (08/06 0359) Pulse Rate: 59 (08/06 0359)  Labs: Recent Labs    05/13/21 1128 05/13/21 1500 05/13/21 2207 05/14/21 0528 05/14/21 1628 05/15/21 0513 05/15/21 0833 05/15/21 1227 05/16/21 0240  HGB 15.8  --   --  16.0  --  15.6  --   --  14.8  HCT 46.3  --   --  47.6  --  47.4  --   --  44.9  PLT 223  --   --  208  --  217  --   --  220  APTT  --   --   --   --   --   --   --  64*  --   LABPROT  --   --   --   --   --   --  12.1  --   --   INR  --   --   --   --   --   --  0.9  --   --   HEPARINUNFRC  --   --    < > 0.36  --  0.15*  --  0.37 0.30  CREATININE 0.66  --   --  0.76 0.87  --   --   --  0.75  TROPONINIHS 6 6  --   --   --   --   --   --   --    < > = values in this interval not displayed.    Estimated Creatinine Clearance: 70.1 mL/min (by C-G formula based on SCr of 0.75 mg/dL).    Assessment: Patient is a 65 y.o M presented to the ED on 8/3 with c/o back and chest pain.  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  Chest CT on 8/3 showed findings with concern for subsegmental PE in the right lower lobe, but not confirmed due to respiratory motion artifact. LE doppler on 8/4 was negativefor DVT. Repeat chect CT on 8/6 resulted back "positive for subsegmental sized emboli in the right lower lobe."  He's  currently on heparin drip for VTE.  - 8/5: liver biopsy. Heparin drip resumed after procedure  Today, 05/16/2021: - heparin level remains therapeutic at 0.44 - cbc stable - no bleeding documented   Goal of Therapy:  Heparin level 0.3-0.7 units/ml Monitor platelets by  anticoagulation protocol: Yes   Plan:  - continue heparin drip at 1150 units/hr - daily heparin level - monitor for s/sx bleeding  Lynelle Doctor 05/16/2021,11:03 AM  __________________________________  Adden: Pharmacy has been consulted to transition pt to LMWH. - d/c heparin drip at 6p - start lovenox '60mg'$  SQ q12h at 6p - Idanha will sign off for LMWH consult, but will follow pt peripherally along with you.  Dia Sitter, PharmD, BCPS 05/16/2021 4:34 PM

## 2021-05-16 NOTE — Consult Note (Signed)
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 He is started on oxycodone as needed I will add dexamethasone He has no focal neurological deficit If his pain is well controlled, he can get outpatient palliative radiation therapy and kyphoplasty  Questionable pulmonary embolism I recommend repeat CT angiogram for evaluation It would make impact on future decision making especially whether he would need to go home on anticoagulation therapy The patient agreed for repeat CT angiogram of the chest  Mild constipation Likely due to poor oral intake and slight dehydration I will start him on gentle IV fluids and laxatives  Chronic cigarette smoking The patient is willing to quit He has been started on nicotine patch  Goals of care discussion Pain under control and completion of staging  Discharge planning I plan to return to see him early morning tomorrow I recommend family members to be present if possible to discuss abnormal findings If his pain is well controlled tomorrow, he can be discharged with outpatient follow-up   The total time spent in the appointment was 80 minutes encounter with patients including review of chart and various tests results, discussions about plan of care and coordination of care plan   All questions were answered. The patient knows to call the clinic with any problems, questions or concerns. No barriers to learning was detected.  Heath Lark, MD 8/6/20229:20 AM  CHIEF COMPLAINTS/PURPOSE OF CONSULTATION:  Metastatic cancer to the liver and  vertebral body, unknown primary, could either be metastatic lung cancer versus primary gallbladder cancer  HISTORY OF PRESENTING ILLNESS:  Jason Lyons 65 y.o. 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[employer] and lived heavy stuff up to 50 pounds on a daily basis He did not recall injuring his back or had a fall He started to have significant back pain radiating around his chest wall to the front on the left side for the past 4 weeks He felt that he has lost some weight but denies changes in his appetite He has regular bowel habits and denies any changes He never had screening colonoscopy Denies history of melena or hematochezia He denies recent cough, shortness of breath or hemoptysis When he presented to the emergency department, he had CT angiogram performed which showed questionable PE but declines close evidence of liver metastasis as well as T4 compression fracture He was admitted for further work-up He completed liver biopsy yesterday on 05/15/2021 He was started on oxycodone for pain and IV heparin for questionable PE He noticed reduced urine output since admission He complained of difficulties tolerating food from the hospital He still  have moderate pain today He denies neurological deficits  He is otherwise healthy and does not have medical problems to his knowledge  MEDICAL HISTORY:  History reviewed. No pertinent past medical history.  SURGICAL HISTORY: History reviewed. No  pertinent surgical history.  SOCIAL HISTORY: Social History   Socioeconomic History   Marital status: Married    Spouse name: Not on file   Number of children: 3   Years of education: Not on file   Highest education level: Not on file  Occupational History   Occupation: manual labor  Tobacco Use   Smoking status: Every Day    Packs/day: 0.50    Years: 40.00    Pack years: 20.00    Types: Cigarettes    Passive exposure: Current   Smokeless tobacco: Never  Vaping Use   Vaping Use: Former   Substances: Nicotine, Flavoring  Substance and Sexual Activity   Alcohol use: Yes    Alcohol/week: 1.0 - 2.0 standard drink    Types: 1 - 2 Cans of beer per week    Comment: occ   Drug use: Never   Sexual activity: Yes  Other Topics Concern   Not on file  Social History Narrative   Not on file   Social Determinants of Health   Financial Resource Strain: Not on file  Food Insecurity: Not on file  Transportation Needs: Not on file  Physical Activity: Not on file  Stress: Not on file  Social Connections: Not on file  Intimate Partner Violence: Not on file    FAMILY HISTORY: Family History  Problem Relation Age of Onset   Cancer Father        throat cancer    ALLERGIES:  has No Known Allergies.  MEDICATIONS:  Current Facility-Administered Medications  Medication Dose Route Frequency Provider Last Rate Last Admin   0.45 % sodium chloride infusion   Intravenous Continuous Alvy Bimler, Turki Tapanes, MD       acetaminophen (TYLENOL) tablet 650 mg  650 mg Oral Q6H PRN Gonfa, Taye T, MD       dexamethasone (DECADRON) tablet 4 mg  4 mg Oral Q8H Shataria Crist, MD   4 mg at 05/16/21 0913   heparin ADULT infusion 100 units/mL (25000 units/2109m)  1,150 Units/hr Intravenous Continuous LThomes Lolling RPH 11.5 mL/hr at 05/16/21 0434 1,150 Units/hr at 05/16/21 0434   nicotine (NICODERM CQ - dosed in mg/24 hours) patch 14 mg  14 mg Transdermal Daily GWendee BeaversT, MD   14 mg at 05/16/21 [id number]   oxyCODONE (Oxy IR/ROXICODONE) immediate release tablet 5 mg  5 mg Oral Q8H PRN GMercy Riding MD   5 mg at 05/16/21 0608   senna-docusate (Senokot-S) tablet 1 tablet  1 tablet Oral BID GHeath Lark MD        REVIEW OF SYSTEMS:   Constitutional: Denies fevers, chills or abnormal night sweats Eyes: Denies blurriness of vision, double vision or watery eyes Ears, nose, mouth, throat, and face: Denies mucositis or sore throat Respiratory: Denies cough, dyspnea or wheezes Cardiovascular: Denies palpitation, chest discomfort or lower extremity swelling Skin: Denies abnormal skin rashes Lymphatics: Denies new lymphadenopathy or easy bruising Neurological:Denies numbness, tingling or new weaknesses Behavioral/Psych: Mood is stable, no new changes  All other systems were reviewed with the patient and are negative.  PHYSICAL EXAMINATION: ECOG PERFORMANCE STATUS: 1 - Symptomatic but completely ambulatory  Vitals:   05/15/21 2035 05/16/21 0359  BP: 136/79 137/86  Pulse: 68 (!) 59  Resp: 20  20  Temp: 97.7 F (36.5 C) (!) 97.4 F (36.3 C)  SpO2: 96% 98%   Filed Weights   05/13/21 1150  Weight: 135 lb (61.2 kg)    GENERAL:alert, no distress and comfortable SKIN: skin color, texture, turgor are normal, no rashes or significant lesions EYES: normal, conjunctiva are pink and non-injected, sclera clear OROPHARYNX:no exudate, no erythema and lips, buccal mucosa, and tongue normal  NECK: supple, thyroid normal size, non-tender, without nodularity LYMPH:  no palpable lymphadenopathy in the cervical, axillary or inguinal LUNGS: clear to auscultation and percussion with normal breathing effort HEART: regular rate & rhythm and no murmurs and no lower extremity edema ABDOMEN:abdomen soft, non-tender and normal bowel sounds Musculoskeletal:no cyanosis of digits and no clubbing.  He has spinal tenderness PSYCH: alert & oriented x 3 with fluent speech NEURO: no focal motor/sensory  deficits  LABORATORY DATA:  I have reviewed the data as listed Lab Results  Component Value Date   WBC 6.9 05/16/2021   HGB 14.8 05/16/2021   HCT 44.9 05/16/2021   MCV 95.1 05/16/2021   PLT 220 05/16/2021   Recent Labs    05/13/21 1500 05/13/21 2327 05/14/21 0528 05/14/21 1628 05/16/21 0240  NA  --   --  139 139 138  K  --   --  3.7 3.8 3.8  CL  --   --  104 100 104  CO2  --   --  29 32 25  GLUCOSE  --   --  131* 74 138*  BUN  --   --  '16 17 20  '$ CREATININE  --   --  0.76 0.87 0.75  CALCIUM  --   --  8.7* 8.8* 8.4*  GFRNONAA  --   --  >60 >60 >60  PROT 7.1 7.2  --  6.5 6.2*  ALBUMIN 4.1 3.9  --  3.5 3.3*  AST 61* 57*  --  52* 52*  ALT 85* 83*  --  75* 72*  ALKPHOS 578* 521*  --  487* 448*  BILITOT 0.9 0.5  --  0.6 0.6  BILIDIR 0.1 <0.1  --   --   --   IBILI 0.8 NOT CALCULATED  --   --   --     RADIOGRAPHIC STUDIES: I have personally reviewed the radiological images as listed and agreed with the findings in the report. CT ABDOMEN PELVIS W WO CONTRAST  Result Date: 05/15/2021 CLINICAL DATA:  Masslike area identified in the liver on recent CTA Chest. EXAM: CT ABDOMEN AND PELVIS WITHOUT AND WITH CONTRAST TECHNIQUE: Multidetector CT imaging of the abdomen and pelvis was performed following the standard protocol before and following the bolus administration of intravenous contrast. CONTRAST:  173m OMNIPAQUE IOHEXOL 350 MG/ML SOLN COMPARISON:  CTA Chest 05/13/2021 FINDINGS: Lower chest: Dependent atelectasis in both lower lobes. Hepatobiliary: 5.0 x 3.3 x 4.5 cm ill-defined irregular low-density lesion is identified in the anterior right liver inferiorly. Arterial phase imaging shows subtle irregular peripheral enhancement with heterogeneous enhancement in the lesion inferiorly. Lesion takes on a more infiltrative appearance at the inferior tip of the liver extending more posteriorly. The mass tracks along the gallbladder fossa towards the fundus in the gallbladder is contracted and  irregular in appearance at this location. Focal biliary dilatation is seen in the anterior right liver (segment VIII) into a lesser degree in the posterior right liver (segments VI and VII). There is abrupt cut off of the dilated bile ducts along the medial aspect of  the right hepatic lobe lesion. Similarly, portal venous anatomy appears obliterated centrally in the anterior right liver along the superomedial margin of the lesion. As above, gallbladder appears irregular with diffuse wall thickening. No extrahepatic biliary duct dilatation. Pancreas: No focal mass lesion. No dilatation of the main duct. No intraparenchymal cyst. No peripancreatic edema. Spleen: No splenomegaly. No focal mass lesion. Adrenals/Urinary Tract: No adrenal nodule or mass. Kidneys unremarkable. No evidence for hydroureter. The urinary bladder appears normal for the degree of distention. Stomach/Bowel: Stomach is unremarkable. No gastric wall thickening. No evidence of outlet obstruction. Duodenum is normally positioned as is the ligament of Treitz. No small bowel wall thickening. No small bowel dilatation. The terminal ileum is normal. The appendix is normal. No gross colonic mass. No colonic wall thickening. Vascular/Lymphatic: Insert calcium aorta upper normal lymph nodes are seen in the hepatoduodenal ligament. No retroperitoneal lymphadenopathy No pelvic sidewall lymphadenopathy. Reproductive: Prostate gland is enlarged with heterogeneous enhancement and dystrophic calcification. Other: Trace free fluid is noted in the pelvis. Musculoskeletal: No worrisome lytic or sclerotic osseous abnormality. IMPRESSION: 1. 5 cm ill-defined irregular low-density lesion in the anterior right liver inferiorly is highly suspicious for primary or metastatic neoplasm. The lesion obliterates bile ducts in the central right liver resulting in right hepatic lobe biliary dilatation. Similarly, portal venous anatomy in the anterior right liver is involved  along the superomedial margin of the lesion. Lesion should be amenable to tissue sampling. 2. Gallbladder wall is contracted and irregular in appearance adjacent to the lesion. Imaging features suggest that gallbladder changes are secondary although primary gallbladder neoplasm not excluded. 3. 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 No evidence for additional metastatic disease in the abdomen or pelvis. 4. Prostatomegaly. 5. Trace free fluid in the pelvis. 6. Aortic Atherosclerosis (ICD10-I70.0). Electronically Signed   By: Misty Stanley M.D.   On: 05/15/2021 05:53   DG Chest 2 View  Result Date: 05/13/2021 CLINICAL DATA:  Chest pain. EXAM: CHEST - 2 VIEW COMPARISON:  No prior. FINDINGS: Mediastinum and hilar structures normal. Low lung volumes with mild bibasilar atelectasis. No pleural effusion or pneumothorax. Heart size normal. Mild thoracic spine scoliosis. No acute bony abnormality. IMPRESSION: Low lung volumes with mild bibasilar atelectasis. Electronically Signed   By: Marcello Moores  Register   On: 05/13/2021 12:04   CT Angio Chest PE W and/or Wo Contrast  Addendum Date: 05/15/2021   ADDENDUM REPORT: 05/15/2021 10:12 ADDENDUM: Case was reviewed with multiple radiologists, subsegmental PE in the right lower lobe is possible, however conclusive evaluation is limited due to respiratory motion artifact. If the presence or absence of subsegmental pulmonary embolus would change management, repeat chest CTA could be considered. Electronically Signed   By: Yetta Glassman MD   On: 05/15/2021 10:12   Result Date: 05/15/2021 CLINICAL DATA:  Shortness of breath EXAM: CT ANGIOGRAPHY CHEST WITH CONTRAST TECHNIQUE: Multidetector CT imaging of the chest was performed using the standard protocol during bolus administration of intravenous contrast. Multiplanar CT image reconstructions and MIPs were obtained to evaluate the vascular anatomy. CONTRAST:  61m OMNIPAQUE IOHEXOL 350 MG/ML SOLN COMPARISON:  None.  FINDINGS: Cardiovascular: Adequate contrast opacification of the pulmonary arteries. Subsegmental pulmonary embolus seen in the right lower lobe pulmonary arteries. There is normal heart size. No pericardial effusion. 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 Mediastinum/Nodes: No enlarged mediastinal, hilar, or axillary lymph nodes. Thyroid gland, trachea, and esophagus demonstrate no significant findings.  Lungs/Pleura: Lungs are clear. No pleural effusion or pneumothorax. No Upper Abdomen: Ill-defined masslike area seen in the right lobe of the liver measuring approximately 6.2 x 5.2 cm with adjacent areas of linear low density. Musculoskeletal: Lytic lesion of the T4 vertebral body with possible soft tissue extension into the left neural foramen. No evidence of retropulsion Review of the MIP images confirms the above findings. IMPRESSION: Positive for subsegmental pulmonary embolus in the right lower lobe. Ill-defined masslike area seen in the right lobe of the liver with adjacent intrahepatic biliary ductal dilation. 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 Possible soft tissue extension into the left neural foramen. Finding could be further evaluated with contrast enhanced MRI of the spine. Critical Value/emergent results were called by telephone at the time of interpretation on 05/13/2021 at 2:00 pm to provider [facility] , who verbally acknowledged these results. Electronically Signed: By: Yetta Glassman MD On: 05/13/2021 14:01   MR CERVICAL SPINE W WO CONTRAST  Result Date: 05/14/2021 CLINICAL DATA:  Metastatic disease evaluation EXAM: MRI CERVICAL, THORACIC AND LUMBAR SPINE WITHOUT AND WITH CONTRAST TECHNIQUE: Multiplanar and multiecho pulse sequences of the cervical spine, to include the craniocervical junction and cervicothoracic junction, and thoracic and lumbar spine, were  obtained without and with intravenous contrast. CONTRAST:  34m GADAVIST GADOBUTROL 1 MMOL/ML IV SOLN COMPARISON:  None. FINDINGS: MRI CERVICAL SPINE FINDINGS Alignment: Physiologic. Vertebrae: No fracture, evidence of discitis, or bone lesion. Cord: Normal signal and morphology. Posterior Fossa, vertebral arteries, paraspinal tissues: Negative. Disc levels: C1-2: Unremarkable C2-3: Small central disc protrusion. There is no spinal canal stenosis. No neural foraminal stenosis. C3-4: Normal disc space and facet joints. There is no spinal canal stenosis. No neural foraminal stenosis. C4-5: Normal disc space and facet joints. There is no spinal canal stenosis. No neural foraminal stenosis. C5-6: Small central disc protrusion. There is no spinal canal stenosis. No neural foraminal stenosis. C6-7: Normal disc space and facet joints. There is no spinal canal stenosis. No neural foraminal stenosis. C7-T1: Normal disc space and facet joints. There is no spinal canal stenosis. No neural foraminal stenosis. MRI THORACIC SPINE FINDINGS Alignment:  Physiologic. Vertebrae: Diffusely abnormal bone marrow signal at T4 with split type fracture and expansion of the left pedicle. There is diffuse abnormal contrast enhancement. Approximately 25% height loss. Cord:  Normal signal and morphology. Paraspinal and other soft tissues: Negative. Disc levels: MRI LUMBAR SPINE FINDINGS Segmentation:  Standard. Alignment:  Physiologic. Vertebrae:  Hemangioma at L1.  No other osseous lesion. Conus medullaris and cauda equina: Conus extends to the L1 level. Conus and cauda equina appear normal. Paraspinal and other soft tissues: Negative Disc levels: L1-L2: Normal disc space and facet joints. No spinal canal stenosis. No neural foraminal stenosis. L2-L3: Normal disc space and facet joints. No spinal canal stenosis. No neural foraminal stenosis. L3-L4: Small disc bulge. No spinal canal stenosis. No neural foraminal stenosis. L4-L5: Small disc bulge  with superimposed central protrusion. Left-greater-than-right lateral recess narrowing without central spinal canal stenosis. No neural foraminal stenosis. L5-S1: Left asymmetric disc bulge. Left lateral recess narrowing without central spinal canal stenosis. Moderate left neural foraminal stenosis. Visualized sacrum: Normal. IMPRESSION: 1. Pathologic split type fracture of T4 with approximately 25% height loss, likely due to metastatic disease. 2. No other evidence of metastatic disease of the cervical, thoracic or lumbar spine. 3. Moderate left L5-S1 neural foraminal stenosis. 4. Left-greater-than-right lateral recess narrowing at L4-5 and L5-S1. Electronically Signed   By:  Ulyses Jarred M.D.   On: 05/14/2021 19:32   MR THORACIC SPINE W WO CONTRAST  Result Date: 05/14/2021 CLINICAL DATA:  Metastatic disease evaluation EXAM: MRI CERVICAL, THORACIC AND LUMBAR SPINE WITHOUT AND WITH CONTRAST TECHNIQUE: Multiplanar and multiecho pulse sequences of the cervical spine, to include the craniocervical junction and cervicothoracic junction, and thoracic and lumbar spine, were obtained without and with intravenous contrast. CONTRAST:  44m GADAVIST GADOBUTROL 1 MMOL/ML IV SOLN COMPARISON:  None. FINDINGS: MRI CERVICAL SPINE FINDINGS Alignment: Physiologic. Vertebrae: No fracture, evidence of discitis, or bone lesion. Cord: Normal signal and morphology. Posterior Fossa, vertebral arteries, paraspinal tissues: Negative. Disc levels: C1-2: Unremarkable C2-3: Small central disc protrusion. There is no spinal canal stenosis. No neural foraminal stenosis. C3-4: Normal disc space and facet joints. There is no spinal canal stenosis. No neural foraminal stenosis. C4-5: Normal disc space and facet joints. There is no spinal canal stenosis. No neural foraminal stenosis. C5-6: Small central disc protrusion. There is no spinal canal stenosis. No neural foraminal stenosis. C6-7: Normal disc space and facet joints. There is no spinal  canal stenosis. No neural foraminal stenosis. C7-T1: Normal disc space and facet joints. There is no spinal canal stenosis. No neural foraminal stenosis. MRI THORACIC SPINE FINDINGS Alignment:  Physiologic. Vertebrae: Diffusely abnormal bone marrow signal at T4 with split type fracture and expansion of the left pedicle. There is diffuse abnormal contrast enhancement. Approximately 25% height loss. Cord:  Normal signal and morphology. Paraspinal and other soft tissues: Negative. Disc levels: MRI LUMBAR SPINE FINDINGS Segmentation:  Standard. Alignment:  Physiologic. Vertebrae:  Hemangioma at L1.  No other osseous lesion. Conus medullaris and cauda equina: Conus extends to the L1 level. Conus and cauda equina appear normal. Paraspinal and other soft tissues: Negative Disc levels: L1-L2: Normal disc space and facet joints. No spinal canal stenosis. No neural foraminal stenosis. L2-L3: Normal disc space and facet joints. No spinal canal stenosis. No neural foraminal stenosis. L3-L4: Small disc bulge. No spinal canal stenosis. No neural foraminal stenosis. L4-L5: Small disc bulge with superimposed central protrusion. Left-greater-than-right lateral recess narrowing without central spinal canal stenosis. No neural foraminal stenosis. L5-S1: Left asymmetric disc bulge. Left lateral recess narrowing without central spinal canal stenosis. Moderate left neural foraminal stenosis. Visualized sacrum: Normal. IMPRESSION: 1. Pathologic split type fracture of T4 with approximately 25% height loss, likely due to metastatic disease. 2. No other evidence of metastatic disease of the cervical, thoracic or lumbar spine. 3. Moderate left L5-S1 neural foraminal stenosis. 4. Left-greater-than-right lateral recess narrowing at L4-5 and L5-S1. Electronically Signed   By: KUlyses JarredM.D.   On: 05/14/2021 19:32   MR Lumbar Spine W Wo Contrast  Result Date: 05/14/2021 CLINICAL DATA:  Metastatic disease evaluation EXAM: MRI CERVICAL,  THORACIC AND LUMBAR SPINE WITHOUT AND WITH CONTRAST TECHNIQUE: Multiplanar and multiecho pulse sequences of the cervical spine, to include the craniocervical junction and cervicothoracic junction, and thoracic and lumbar spine, were obtained without and with intravenous contrast. CONTRAST:  628mGADAVIST GADOBUTROL 1 MMOL/ML IV SOLN COMPARISON:  None. FINDINGS: MRI CERVICAL SPINE FINDINGS Alignment: Physiologic. Vertebrae: No fracture, evidence of discitis, or bone lesion. Cord: Normal signal and morphology. Posterior Fossa, vertebral arteries, paraspinal tissues: Negative. Disc levels: C1-2: Unremarkable C2-3: Small central disc protrusion. There is no spinal canal stenosis. No neural foraminal stenosis. C3-4: Normal disc space and facet joints. There is no spinal canal stenosis. No neural foraminal stenosis. C4-5: Normal disc space and facet joints. There is no spinal canal  stenosis. No neural foraminal stenosis. C5-6: Small central disc protrusion. There is no spinal canal stenosis. No neural foraminal stenosis. C6-7: Normal disc space and facet joints. There is no spinal canal stenosis. No neural foraminal stenosis. C7-T1: Normal disc space and facet joints. There is no spinal canal stenosis. No neural foraminal stenosis. MRI THORACIC SPINE FINDINGS Alignment:  Physiologic. Vertebrae: Diffusely abnormal bone marrow signal at T4 with split type fracture and expansion of the left pedicle. There is diffuse abnormal contrast enhancement. Approximately 25% height loss. Cord:  Normal signal and morphology. Paraspinal and other soft tissues: Negative. Disc levels: MRI LUMBAR SPINE FINDINGS Segmentation:  Standard. Alignment:  Physiologic. Vertebrae:  Hemangioma at L1.  No other osseous lesion. Conus medullaris and cauda equina: Conus extends to the L1 level. Conus and cauda equina appear normal. Paraspinal and other soft tissues: Negative Disc levels: L1-L2: Normal disc space and facet joints. No spinal canal stenosis.  No neural foraminal stenosis. L2-L3: Normal disc space and facet joints. No spinal canal stenosis. No neural foraminal stenosis. L3-L4: Small disc bulge. No spinal canal stenosis. No neural foraminal stenosis. L4-L5: Small disc bulge with superimposed central protrusion. Left-greater-than-right lateral recess narrowing without central spinal canal stenosis. No neural foraminal stenosis. L5-S1: Left asymmetric disc bulge. Left lateral recess narrowing without central spinal canal stenosis. Moderate left neural foraminal stenosis. Visualized sacrum: Normal. IMPRESSION: 1. Pathologic split type fracture of T4 with approximately 25% height loss, likely due to metastatic disease. 2. No other evidence of metastatic disease of the cervical, thoracic or lumbar spine. 3. Moderate left L5-S1 neural foraminal stenosis. 4. Left-greater-than-right lateral recess narrowing at L4-5 and L5-S1. Electronically Signed   By: Ulyses Jarred M.D.   On: 05/14/2021 19:32   US BIOPSY (LIVER)  Result Date: 05/15/2021 INDICATION: 65 year old with evidence of metastatic disease. Patient has a suspicious hepatic lesion and needs a tissue diagnosis. EXAM: ULTRASOUND-GUIDED LIVER LESION BIOPSY MEDICATIONS: Moderate sedation ANESTHESIA/SEDATION: Moderate (conscious) sedation was employed during this procedure. A total of Versed 2.0 mg and Fentanyl 100 mcg was administered intravenously. Moderate Sedation Time: 13 minutes. The patient's level of consciousness and vital signs were monitored continuously by radiology nursing throughout the procedure under my direct supervision. FLUOROSCOPY TIME:  None COMPLICATIONS: None immediate. PROCEDURE: Informed written consent was obtained from the patient after a thorough discussion of the procedural risks, benefits and alternatives. All questions were addressed. Translator was used. A timeout was performed prior to the initiation of the procedure. Liver was evaluated with ultrasound. Lesions in the right  hepatic lobe identified. The skin was prepped with chlorhexidine and sterile field was created. Maximal barrier sterile technique was utilized including caps, mask, sterile gowns, sterile gloves, sterile drape, hand hygiene and skin antiseptic. Skin was anesthetized using 1% lidocaine. Small incision was made. Using ultrasound guidance, 17 gauge coaxial needle was directed into the right hepatic lobe and into a hypoechoic lesion. Total of 3 core biopsies were obtained with an 18 gauge device. Specimens placed in formalin. Gel-Foam slurry was injected along the needle tract. Bandage placed over the puncture site. FINDINGS: Lateral right hepatic lobe has a nodular contour with a geographic area of decreased echogenicity. There is at least 1 well-defined hypoechoic lesion in this area. This hypoechoic lesion was successfully targeted for biopsy. Needle position was confirmed within the lesion. Trace perihepatic ascites noted before and after the procedure. IMPRESSION: Ultrasound-guided core biopsies of a right hepatic lesion. Electronically Signed   By: Markus Daft M.D.   On: 05/15/2021  17:16   VAS [place] LOWER EXTREMITY VENOUS (DVT)  Result Date: 05/14/2021  Lower Venous DVT Study Patient Name:  [facility]  Date of Exam:   05/14/2021 Medical Rec #: [id number]   Accession #:    [id number] Date of Birth: 03/02/1956  Patient Gender: M Patient Age:   64Y Exam Location:  [facility] Procedure:      VAS [place] LOWER EXTREMITY VENOUS (DVT) Referring Phys: Wendee Beavers --------------------------------------------------------------------------------  Indications: Pulmonary embolism.  Anticoagulation: Heparin. Comparison Study: No prior studies. Performing Technologist: Darlin Coco RDMS,RVT  Examination Guidelines: A complete evaluation includes B-mode imaging, spectral Doppler, color Doppler, and power Doppler as needed of all accessible portions of each vessel. Bilateral testing is considered an integral part of a complete  examination. Limited examinations for reoccurring indications may be performed as noted. The reflux portion of the exam is performed with the patient in reverse Trendelenburg.  +---------+---------------+---------+-----------+----------+--------------+ RIGHT    CompressibilityPhasicitySpontaneityPropertiesThrombus Aging +---------+---------------+---------+-----------+----------+--------------+ CFV      Full           Yes      Yes                                 +---------+---------------+---------+-----------+----------+--------------+ SFJ      Full                                                        +---------+---------------+---------+-----------+----------+--------------+ FV Prox  Full                                                        +---------+---------------+---------+-----------+----------+--------------+ FV Mid   Full                                                        +---------+---------------+---------+-----------+----------+--------------+ FV DistalFull                                                        +---------+---------------+---------+-----------+----------+--------------+ PFV      Full                                                        +---------+---------------+---------+-----------+----------+--------------+ POP      Full           Yes      Yes                                 +---------+---------------+---------+-----------+----------+--------------+ PTV      Full                                                        +---------+---------------+---------+-----------+----------+--------------+  PERO     Full                                                        +---------+---------------+---------+-----------+----------+--------------+   +---------+---------------+---------+-----------+----------+--------------+ LEFT     CompressibilityPhasicitySpontaneityPropertiesThrombus Aging  +---------+---------------+---------+-----------+----------+--------------+ CFV      Full           Yes      Yes                                 +---------+---------------+---------+-----------+----------+--------------+ SFJ      Full                                                        +---------+---------------+---------+-----------+----------+--------------+ FV Prox  Full                                                        +---------+---------------+---------+-----------+----------+--------------+ FV Mid   Full                                                        +---------+---------------+---------+-----------+----------+--------------+ FV DistalFull                                                        +---------+---------------+---------+-----------+----------+--------------+ PFV      Full                                                        +---------+---------------+---------+-----------+----------+--------------+ POP      Full           Yes      Yes                                 +---------+---------------+---------+-----------+----------+--------------+ PTV      Full                                                        +---------+---------------+---------+-----------+----------+--------------+ PERO     Full                                                        +---------+---------------+---------+-----------+----------+--------------+  Summary: RIGHT: - There is no evidence of deep vein thrombosis in the lower extremity.  - No cystic structure found in the popliteal fossa.  LEFT: - There is no evidence of deep vein thrombosis in the lower extremity.  - No cystic structure found in the popliteal fossa.  *See table(s) above for measurements and observations. Electronically signed by Servando Snare MD on 05/14/2021 at 3:49:43 PM.    Final

## 2021-05-17 DIAGNOSIS — I2699 Other pulmonary embolism without acute cor pulmonale: Secondary | ICD-10-CM | POA: Diagnosis not present

## 2021-05-17 DIAGNOSIS — C7951 Secondary malignant neoplasm of bone: Secondary | ICD-10-CM | POA: Diagnosis not present

## 2021-05-17 LAB — COMPREHENSIVE METABOLIC PANEL
ALT: 83 U/L — ABNORMAL HIGH (ref 0–44)
AST: 57 U/L — ABNORMAL HIGH (ref 15–41)
Albumin: 3.7 g/dL (ref 3.5–5.0)
Alkaline Phosphatase: 469 U/L — ABNORMAL HIGH (ref 38–126)
Anion gap: 6 (ref 5–15)
BUN: 15 mg/dL (ref 8–23)
CO2: 25 mmol/L (ref 22–32)
Calcium: 8.9 mg/dL (ref 8.9–10.3)
Chloride: 105 mmol/L (ref 98–111)
Creatinine, Ser: 0.56 mg/dL — ABNORMAL LOW (ref 0.61–1.24)
GFR, Estimated: >60 mL/min (ref >60)
Glucose, Bld: 126 mg/dL — ABNORMAL HIGH (ref 70–99)
Potassium: 4 mmol/L (ref 3.5–5.1)
Sodium: 136 mmol/L (ref 135–145)
Total Bilirubin: 0.6 mg/dL (ref 0.3–1.2)
Total Protein: 6.9 g/dL (ref 6.5–8.1)

## 2021-05-17 LAB — URIC ACID: Uric Acid, Serum: 3.3 mg/dL — ABNORMAL LOW (ref 3.7–8.6)

## 2021-05-17 LAB — LACTATE DEHYDROGENASE: LDH: 180 U/L (ref 98–192)

## 2021-05-17 LAB — HEPARIN LEVEL (UNFRACTIONATED): Heparin Unfractionated: 0.52 IU/mL (ref 0.30–0.70)

## 2021-05-17 LAB — VITAMIN D 25 HYDROXY (VIT D DEFICIENCY, FRACTURES): Vit D, 25-Hydroxy: 25.18 ng/mL — ABNORMAL LOW (ref 30–100)

## 2021-05-17 MED ORDER — NICOTINE 21 MG/24HR TD PT24: 21.0000 mg | MEDICATED_PATCH | Freq: Every day | TRANSDERMAL | 0 refills | Status: DC

## 2021-05-17 MED ORDER — SENNOSIDES-DOCUSATE SODIUM 8.6-50 MG PO TABS: 1.0000 | ORAL_TABLET | Freq: Two times a day (BID) | ORAL | Status: DC | PRN

## 2021-05-17 MED ORDER — HEPARIN (PORCINE) 25000 UT/250ML-% IV SOLN
1150.0000 [IU]/h | INTRAVENOUS | Status: DC
  Administered 2021-05-17 – 2021-05-18 (×2): 1150 [IU]/h via INTRAVENOUS
  Filled 2021-05-17 (×3): qty 250

## 2021-05-17 NOTE — Progress Notes (Signed)
  Armando Gang Kahiau Schewe PA-C 05/20/2021 1:38 PM

## 2021-05-17 NOTE — Progress Notes (Signed)
PROGRESS NOTE  Jason Lyons [id number] DOB: 1956-06-02   PCP: Pcp, No  Patient is from: Home.  DOA: 05/13/2021 LOS: 4  Chief complaints:  Chief Complaint  Patient presents with   Back Pain     Brief Narrative / Interim history: 65 year old [place] speaking male with history of tobacco abuse presenting with left-sided chest pain and mid back pain for about a months, and admitted with working diagnosis of RLL subsegmental PE, 6.2 x 5.2 cm right hepatic lobe mass and T4 lytic lesion with possible soft tissue invasion into left neural foramina.  He was a started on heparin drip.  EDP discussed with neurosurgery, Dr. Ardis Hughs who agrees with anticoagulation, medical management, MRI and biopsy but no need for acute NSU intervention.  MRI C/T/L-spine with pathologic split type fracture of T4 with approximately 25% height loss, moderate left L5-S1 neural foraminal stenosis and bilateral lateral recess narrowing at L4-5.   IR consulted the next day and recommended CT abdomen and pelvis which showed previously noted liver lesion obliterating bile duct in central right liver resulting in right hepatic lobe biliary dilation, known T4 lesion and prostamegaly.  Underwent ultrasound-guided liver biopsy on 8/5.  MRI brain negative.  Oncology requested port a cath placement.   Subjective: Seen and examined earlier this morning with the help of video interpreter with ID number [id number].  No major events overnight of this morning.  No complaints.  Pain fairly controlled.  He denies shortness of breath, GI or UTI symptoms.  Family members at bedside.  Objective: Vitals:   05/16/21 1456 05/16/21 2102 05/17/21 0415 05/17/21 1324  BP: (!) 171/76 136/82 124/72 131/82  Pulse: 65 63 63 69  Resp: '18 20 16 20  '$ Temp: 97.9 F (36.6 C) 98.1 F (36.7 C) 97.9 F (36.6 C) (!) 97.5 F (36.4 C)  TempSrc:  Oral Oral   SpO2: 99% 98% 95% 99%  Weight:      Height:        Intake/Output Summary (Last 24 hours) at  05/17/2021 1644 Last data filed at 05/17/2021 1513 Gross per 24 hour  Intake 1689.1 ml  Output --  Net 1689.1 ml   Filed Weights   05/13/21 1150  Weight: 61.2 kg    Examination:  GENERAL: No apparent distress.  Nontoxic. HEENT: MMM.  Vision and hearing grossly intact.  NECK: Supple.  No apparent JVD.  RESP: On RA.  No IWOB.  Fair aeration bilaterally. CVS:  RRR. Heart sounds normal.  ABD/GI/GU: BS+. Abd soft, NTND.  MSK/EXT:  Moves extremities. No apparent deformity. No edema.  SKIN: no apparent skin lesion or wound NEURO: Awake and alert. Oriented appropriately.  No apparent focal neuro deficit. PSYCH: Calm. Normal affect.   Procedures:  8/5-ultrasound-guided liver biopsy by IR  Microbiology summarized: COVID-19 and influenza PCR nonreactive.  Assessment & Plan: Acute RLL subsegmental PE-initial CTA chest was nonconclusive.  Repeat CTA chest confirmed this.  Does not seem to be symptomatic from this.  His chest pain is on the left and seems to be musculoskeletal.  He is back pain likely due to T4 region.  LE [place] negative for DVT.  Hemodynamically stable.  Although his PE seems to be small, he is at high risk for VTE due to malignancy. -Oncology recommended changing Lovenox to IV heparin until port a cath placement -P.o. Eliquis or Xarelto on discharge.  Right hepatic lobe mass-6.2 x 5.2 cm right hepatic lobe mass noted on CT chest.  Never had colonoscopy. T4 pathologic fracture  with 25% height loss-could be the cause of his left-sided chest pain -CEA elevated.  AFP on the upper limit of normal. -NS, Dr. Ardis Hughs consulted on admission and did not feel there is any need for acute NS intervention -S/p US guided liver biopsy by IR on 8/5 -MRI brain negative. -Appreciate oncology recommendations  -Port a cath placement requested  -Dexamethasone 4 mg every 8 hours-help with pain. -Continue as needed Tylenol and oxycodone  Right hepatic lobe biliary dilation-due to bile duct  obliteration by liver mass Elevated liver enzymes/alkaline phosphatase: Likely due to the above.  Stable.  GGT 687. -Continue monitoring  Elevated BP: No history of hypertension.  Normotensive.  -As needed hydralazine  Tobacco use disorder: Current smoker. -Encourage cessation -Nicotine patch  Language barrier affecting communication -Patient's son assisted with interpretation per patient's choice.  Body mass index is 24.69 kg/m.         DVT prophylaxis:    On IV heparin.  Code Status: Full code Family Communication: Patient and/or RN.  Updated patient's family at bedside. Level of care: Telemetry Status is: Inpatient  Remains inpatient appropriate because:Ongoing diagnostic testing needed not appropriate for outpatient work up and Inpatient level of care appropriate due to severity of illness  Dispo: The patient is from: Home              Anticipated d/c is to: Home              Patient currently is not medically stable to d/c.   Difficult to place patient No       Consultants:  Neurosurgery over the phone by EDP Interventional radiology Oncology   Sch Meds:  Scheduled Meds:  dexamethasone  4 mg Oral Q8H   nicotine  14 mg Transdermal Daily   senna-docusate  1 tablet Oral BID   Continuous Infusions:  heparin 1,150 Units/hr (05/17/21 1513)    PRN Meds:.  Antimicrobials: Anti-infectives (From admission, onward)    None        I have personally reviewed the following labs and images: CBC: Recent Labs  Lab 05/13/21 1128 05/14/21 0528 05/15/21 0513 05/16/21 0240  WBC 6.4 7.2 6.6 6.9  HGB 15.8 16.0 15.6 14.8  HCT 46.3 47.6 47.4 44.9  MCV 91.9 95.8 95.6 95.1  PLT 223 208 217 220   BMP &GFR Recent Labs  Lab 05/13/21 1128 05/14/21 0528 05/14/21 1628 05/16/21 0240 05/17/21 0532  NA 142 139 139 138 136  K 4.2 3.7 3.8 3.8 4.0  CL 107 104 100 104 105  CO2 25 29 32 25 25  GLUCOSE 90 131* 74 138* 126*  BUN '14 16 17 20 15  '$ CREATININE 0.66  0.76 0.87 0.75 0.56*  CALCIUM 9.2 8.7* 8.8* 8.4* 8.9  MG  --   --  2.5* 2.4  --   PHOS  --   --  3.5 3.2  --    Estimated Creatinine Clearance: 70.1 mL/min (A) (by C-G formula based on SCr of 0.56 mg/dL (L)). Liver & Pancreas: Recent Labs  Lab 05/13/21 1500 05/13/21 2327 05/14/21 1628 05/16/21 0240 05/17/21 0532  AST 61* 57* 52* 52* 57*  ALT 85* 83* 75* 72* 83*  ALKPHOS 578* 521* 487* 448* 469*  BILITOT 0.9 0.5 0.6 0.6 0.6  PROT 7.1 7.2 6.5 6.2* 6.9  ALBUMIN 4.1 3.9 3.5 3.3* 3.7   No results for input(s): LIPASE, AMYLASE in the last 168 hours. No results for input(s): AMMONIA in the last 168 hours. Diabetic: No  results for input(s): HGBA1C in the last 72 hours. No results for input(s): GLUCAP in the last 168 hours. Cardiac Enzymes: No results for input(s): CKTOTAL, CKMB, CKMBINDEX, TROPONINI in the last 168 hours. No results for input(s): PROBNP in the last 8760 hours. Coagulation Profile: Recent Labs  Lab 05/15/21 0833  INR 0.9   Thyroid Function Tests: No results for input(s): TSH, T4TOTAL, FREET4, T3FREE, THYROIDAB in the last 72 hours. Lipid Profile: No results for input(s): CHOL, HDL, LDLCALC, TRIG, CHOLHDL, LDLDIRECT in the last 72 hours. Anemia Panel: No results for input(s): VITAMINB12, FOLATE, FERRITIN, TIBC, IRON, RETICCTPCT in the last 72 hours. Urine analysis:    Component Value Date/Time   COLORURINE YELLOW 08/22/2018 1125   APPEARANCEUR CLEAR 08/22/2018 1125   LABSPEC 1.003 (L) 08/22/2018 1125   PHURINE 6.0 08/22/2018 1125   GLUCOSEU NEGATIVE 08/22/2018 1125   HGBUR LARGE (A) 08/22/2018 1125   BILIRUBINUR NEGATIVE 08/22/2018 1125   KETONESUR NEGATIVE 08/22/2018 1125   PROTEINUR NEGATIVE 08/22/2018 1125   NITRITE NEGATIVE 08/22/2018 1125   LEUKOCYTESUR NEGATIVE 08/22/2018 1125   Sepsis Labs: Invalid input(s): PROCALCITONIN, [place]  Microbiology: Recent Results (from the past 240 hour(s))  Resp Panel by RT-PCR (Flu A&B, Covid)  Nasopharyngeal Swab     Status: None   Collection Time: 05/13/21  3:40 PM   Specimen: Nasopharyngeal Swab; Nasopharyngeal(NP) swabs in vial transport medium  Result Value Ref Range Status   SARS Coronavirus 2 by RT PCR NEGATIVE NEGATIVE Final    Comment: (NOTE) SARS-CoV-2 target nucleic acids are NOT DETECTED.  The SARS-CoV-2 RNA is generally detectable in upper respiratory specimens during the acute phase of infection. The lowest concentration of SARS-CoV-2 viral copies this assay can detect is 138 copies/mL. A negative result does not preclude SARS-Cov-2 infection and should not be used as the sole basis for treatment or other patient management decisions. A negative result may occur with  improper specimen collection/handling, submission of specimen other than nasopharyngeal swab, presence of viral mutation(s) within the areas targeted by this assay, and inadequate number of viral copies(<138 copies/mL). A negative result must be combined with clinical observations, patient history, and epidemiological information. The expected result is Negative.  Fact Sheet for Patients:  EntrepreneurPulse.com.au  Fact Sheet for Healthcare Providers:  IncredibleEmployment.be  This test is no t yet approved or cleared by the [place] FDA and  has been authorized for detection and/or diagnosis of SARS-CoV-2 by FDA under an Emergency Use Authorization (EUA). This EUA will remain  in effect (meaning this test can be used) for the duration of the COVID-19 declaration under Section 564(b)(1) of the Act, 21 U.S.C.section 360bbb-3(b)(1), unless the authorization is terminated  or revoked sooner.       Influenza A by PCR NEGATIVE NEGATIVE Final   Influenza B by PCR NEGATIVE NEGATIVE Final    Comment: (NOTE) The Xpert Xpress SARS-CoV-2/FLU/RSV plus assay is intended as an aid in the diagnosis of influenza from Nasopharyngeal swab specimens and should not be  used as a sole basis for treatment. Nasal washings and aspirates are unacceptable for Xpert Xpress SARS-CoV-2/FLU/RSV testing.  Fact Sheet for Patients: EntrepreneurPulse.com.au  Fact Sheet for Healthcare Providers: IncredibleEmployment.be  This test is not yet approved or cleared by the [place] FDA and has been authorized for detection and/or diagnosis of SARS-CoV-2 by FDA under an Emergency Use Authorization (EUA). This EUA will remain in effect (meaning this test can be used) for the duration of the COVID-19 declaration under Section 564(b)(1) of  the Act, 21 U.S.C. section 360bbb-3(b)(1), unless the authorization is terminated or revoked.  Performed at [facility], [address] [place], [place] [address]     Radiology Studies: MR BRAIN W WO CONTRAST  Result Date: 05/16/2021 CLINICAL DATA:  Cancer of unknown primary, staging metastatic cancer. EXAM: MRI HEAD WITHOUT AND WITH CONTRAST TECHNIQUE: Multiplanar, multiecho pulse sequences of the brain and surrounding structures were obtained without and with intravenous contrast. CONTRAST:  24m GADAVIST GADOBUTROL 1 MMOL/ML IV SOLN COMPARISON:  None. FINDINGS: Brain: There is no evidence of an acute infarct, intracranial hemorrhage, mass, midline shift, or extra-axial fluid collection. Mild cerebral atrophy is within normal limits for age. No significant white matter disease is seen. No abnormal enhancement is identified. Vascular: Major intracranial vascular flow voids are preserved. Skull and upper cervical spine: Unremarkable bone marrow signal. Sinuses/Orbits: Unremarkable orbits. Paranasal sinuses and mastoid air cells are clear. Other: None. IMPRESSION: Unremarkable appearance of the brain for age. No evidence of intracranial metastases. Electronically Signed   By: ALogan BoresM.D.   On: 05/16/2021 17:38       Harvy Riera T. [place] If 7PM-7AM, please  contact night-coverage [email or web address] 05/17/2021, 4:44 PM

## 2021-05-17 NOTE — Progress Notes (Addendum)
ANTICOAGULATION CONSULT NOTE  Pharmacy Consult for heparin Indication: acute pulmonary embolus  No Known Allergies  Patient Measurements: Height: '5\' 2"'$  (157.5 cm) Weight: 61.2 kg (135 lb) IBW/kg (Calculated) : 54.6 Heparin Dosing Weight: 61 kg  Vital Signs: Temp: 97.9 F (36.6 C) (08/07 0415) Temp Source: Oral (08/07 0415) BP: 124/72 (08/07 0415) Pulse Rate: 63 (08/07 0415)  Labs: Recent Labs    05/14/21 1628 05/15/21 0513 05/15/21 0513 05/15/21 0833 05/15/21 1227 05/16/21 0240 05/16/21 1103 05/17/21 0532  HGB  --  15.6  --   --   --  14.8  --   --   HCT  --  47.4  --   --   --  44.9  --   --   PLT  --  217  --   --   --  220  --   --   APTT  --   --   --   --  64*  --   --   --   LABPROT  --   --   --  12.1  --   --   --   --   INR  --   --   --  0.9  --   --   --   --   HEPARINUNFRC  --  0.15*   < >  --  0.37 0.30 0.44  --   CREATININE 0.87  --   --   --   --  0.75  --  0.56*   < > = values in this interval not displayed.     Estimated Creatinine Clearance: 70.1 mL/min (A) (by C-G formula based on SCr of 0.56 mg/dL (L)).    Assessment: Patient is a 64 y.o M presented to the ED on 8/3 with c/o back and chest pain.  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  Chest CT on 8/3 showed findings with concern for subsegmental PE in the right lower lobe, but not confirmed due to respiratory motion artifact. LE doppler on 8/4 was negativefor DVT. Repeat chect CT on 8/6 resulted back "positive for subsegmental sized emboli in the right lower lobe."  He was initially started on heparin for VTE then transitioned to Lovenox.  Today, pharmacy has been reconsulted to transition back to heparin infusion.  - 8/5: liver biopsy. Heparin drip resumed after procedure  Today, 05/17/2021: - Oncology requesting port placement and return to heparin infusion   - Last dose of Lovenox was 60 mg this morning at 0513 - CBC stable - No bleeding  documented   Goal of Therapy:  Heparin level 0.3-0.7 units/ml Monitor platelets by anticoagulation protocol: Yes   Plan:  Discontinue Lovenox Resume heparin drip at 1150 units/hr with no bolus 4 hours before the next dose of Lovenox was due (1400 today) Obtain 6 hour heparin level after start of infusion Monitor daily heparin level, CBC, s/s bleeding F/U long-term anticoagulation plan   Cabot Cromartie P. Legrand Como, PharmD, [place] Please utilize Amion for appropriate phone number to reach the unit pharmacist ([place]) 05/17/2021 10:25 AM  Addendum: 05/17/21 2014 heparin level therapeutic at 0.52 with heparin infusing at 1150 units/hr. Continue heparin at 1150 units/hr Obtain 6 hour heparin level after start of infusion Monitor daily heparin level, CBC, s/s bleeding F/U long-term anticoagulation plan  Neesha Langton P. Legrand Como, PharmD, BCPS Clinical Pharmacist [place] Please utilize Amion for appropriate phone number to reach the unit  pharmacist ([place]) 05/17/2021 9:40 PM

## 2021-05-17 NOTE — H&P (Addendum)
Chief Complaint: Patient was seen in consultation today for image guided Port-a-cath placement  Chief Complaint  Patient presents with   Back Pain   at the request of Dr. Alvy Bimler, Delane Ginger.   Referring Physician(s): Dr. Alvy Bimler, N.  Supervising Physician: Ruthann Cancer  Patient Status: [facility] - In-pt  History of Present Illness: Jason Lyons is a 64 y.o. male with no known PMH, presented to [facility] ED with CC of back pain on 05/13/21, imaging study showed  possible metastatic cancer.  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   IR was consulted for Port-a-cath placement..  Patient was evaluated at the bedside. Patient's preferred language is [place]., interpreter was used.  Patient laying in bed, not in acute distress.  Denise headache, fever, chills, shortness of breath, cough, chest pain, abdominal pain, nausea ,vomiting, and bleeding.  History reviewed. No pertinent past medical history.  History reviewed. No pertinent surgical history.  Allergies: Patient has no known allergies.  Medications: Prior to Admission medications   Medication Sig Start Date End Date Taking? Authorizing Provider  ibuprofen (ADVIL,MOTRIN) 600 MG tablet Take 1 tablet (600 mg total) by mouth every 6 (six) hours as needed for moderate pain. 08/22/18  Yes Duffy Bruce, MD  nicotine (NICODERM CQ) 21 mg/24hr patch Place 1 patch (21 mg total) onto the skin daily. 05/17/21   Mercy Riding, MD  ondansetron (ZOFRAN ODT) 4 MG disintegrating tablet Take 1 tablet (4 mg total) by mouth every 8 (eight) hours as needed for nausea or vomiting. 08/22/18   Duffy Bruce, MD  oxyCODONE-acetaminophen (PERCOCET/ROXICET) 5-325 MG tablet Take 1-2 tablets by mouth every 4 (four) hours as needed for moderate pain or severe pain. 08/22/18   Duffy Bruce, MD  senna-docusate (SENOKOT-S) 8.6-50 MG tablet Take 1 tablet by mouth 2 (two)  times daily as needed for mild constipation or moderate constipation. 05/17/21   Mercy Riding, MD     Family History  Problem Relation Age of Onset   Cancer Father        throat cancer    Social History   Socioeconomic History   Marital status: Married    Spouse name: Not on file   Number of children: 3   Years of education: Not on file   Highest education level: Not on file  Occupational History   Occupation: manual labor  Tobacco Use   Smoking status: Every Day    Packs/day: 0.50    Years: 40.00    Pack years: 20.00    Types: Cigarettes    Passive exposure: Current   Smokeless tobacco: Never  Vaping Use   Vaping Use: Former   Substances: Nicotine, Flavoring  Substance and Sexual Activity   Alcohol use: Yes    Alcohol/week: 1.0 - 2.0 standard drink    Types: 1 - 2 Cans of beer per week    Comment: occ   Drug use: Never   Sexual activity: Yes  Other Topics Concern   Not on file  Social History Narrative   Not on file   Social Determinants of Health   Financial Resource Strain: Not on file  Food Insecurity: Not on file  Transportation Needs: Not on file  Physical Activity: Not on file  Stress: Not on file  Social Connections: Not on file     Review of Systems: A 12 point ROS discussed and pertinent positives are indicated in the HPI above.  All other systems are negative.   Vital Signs: BP 131/82 (BP Location: Right Arm)   Pulse 69   Temp (!) 97.5 F (36.4 C)   Resp 20   Ht '5\' 2"'$  (1.575 m)   Wt 135 lb (61.2 kg)   SpO2 99%   BMI 24.69 kg/m   Physical Exam  Vitals and nursing note reviewed.  Constitutional:      General: He is not in acute distress.    Appearance: Normal appearance.  HENT:     Head: Normocephalic and atraumatic.     Mouth/Throat:     Mouth: Mucous membranes are moist.     Pharynx: Oropharynx is clear.  Cardiovascular:     Rate and Rhythm: Normal rate and regular rhythm.     Pulses: Normal pulses.     Heart sounds: Normal  heart sounds.  Pulmonary:     Effort: Pulmonary effort is normal.     Breath sounds: Normal breath sounds. No wheezing, rhonchi or rales.  Abdominal:     General: Bowel sounds are normal. There is no distension.     Palpations: Abdomen is soft.  Skin:    General: Skin is warm and dry.  Neurological:     Mental Status: He is alert and oriented to person, place, and time.  Psychiatric:        Mood and Affect: Mood normal.        Behavior: Behavior normal.    MD Evaluation Airway: WNL Heart: WNL Abdomen: WNL Chest/ Lungs: WNL ASA  Classification: 2 Mallampati/Airway Score: Two  Imaging: CT ABDOMEN PELVIS W WO CONTRAST  Result Date: 05/15/2021 CLINICAL DATA:  Masslike area identified in the liver on recent CTA Chest. EXAM: CT ABDOMEN AND PELVIS WITHOUT AND WITH CONTRAST TECHNIQUE: Multidetector CT imaging of the abdomen and pelvis was performed following the standard protocol before and following the bolus administration of intravenous contrast. CONTRAST:  153m OMNIPAQUE IOHEXOL 350 MG/ML SOLN COMPARISON:  CTA Chest 05/13/2021 FINDINGS: Lower chest: Dependent atelectasis in both lower lobes. Hepatobiliary: 5.0 x 3.3 x 4.5 cm ill-defined irregular low-density lesion is identified in the anterior right liver inferiorly. Arterial phase imaging shows subtle irregular peripheral enhancement with heterogeneous enhancement in the lesion inferiorly. Lesion takes on a more infiltrative appearance at the inferior tip of the liver extending more posteriorly. The mass tracks along the gallbladder fossa towards the fundus in the gallbladder is contracted and irregular in appearance at this location. Focal biliary dilatation is seen in the anterior right liver (segment VIII) into a lesser degree in the posterior right liver (segments VI and VII). There is abrupt cut off of the dilated bile ducts along the medial aspect of the right hepatic lobe lesion. Similarly, portal venous anatomy appears obliterated  centrally in the anterior right liver along the superomedial margin of the lesion. As above, gallbladder appears irregular with diffuse wall thickening. No extrahepatic biliary duct dilatation. Pancreas: No focal mass lesion. No dilatation of the main duct. No intraparenchymal cyst. No peripancreatic edema. Spleen: No splenomegaly. No focal mass lesion. Adrenals/Urinary Tract: No adrenal nodule or mass. Kidneys unremarkable. No evidence for hydroureter. The urinary bladder appears normal for the degree of distention. Stomach/Bowel: Stomach is unremarkable. No gastric wall thickening. No evidence of outlet obstruction. Duodenum is normally positioned as is the ligament of Treitz. No small bowel wall thickening. No small bowel dilatation. The terminal ileum is normal. The appendix is normal. No gross colonic mass. No colonic wall thickening. Vascular/Lymphatic: Insert calcium  aorta upper normal lymph nodes are seen in the hepatoduodenal ligament. No retroperitoneal lymphadenopathy No pelvic sidewall lymphadenopathy. Reproductive: Prostate gland is enlarged with heterogeneous enhancement and dystrophic calcification. Other: Trace free fluid is noted in the pelvis. Musculoskeletal: No worrisome lytic or sclerotic osseous abnormality. IMPRESSION: 1. 5 cm ill-defined irregular low-density lesion in the anterior right liver inferiorly is highly suspicious for primary or metastatic neoplasm. The lesion obliterates bile ducts in the central right liver resulting in right hepatic lobe biliary dilatation. Similarly, portal venous anatomy in the anterior right liver is involved along the superomedial margin of the lesion. Lesion should be amenable to tissue sampling. 2. Gallbladder wall is contracted and irregular in appearance adjacent to the lesion. Imaging features suggest that gallbladder changes are secondary although primary gallbladder neoplasm not excluded. 3. 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 No  evidence for additional metastatic disease in the abdomen or pelvis. 4. Prostatomegaly. 5. Trace free fluid in the pelvis. 6. Aortic Atherosclerosis (ICD10-I70.0). Electronically Signed   By: Misty Stanley M.D.   On: 05/15/2021 05:53   DG Chest 2 View  Result Date: 05/13/2021 CLINICAL DATA:  Chest pain. EXAM: CHEST - 2 VIEW COMPARISON:  No prior. FINDINGS: Mediastinum and hilar structures normal. Low lung volumes with mild bibasilar atelectasis. No pleural effusion or pneumothorax. Heart size normal. Mild thoracic spine scoliosis. No acute bony abnormality. IMPRESSION: Low lung volumes with mild bibasilar atelectasis. Electronically Signed   By: Marcello Moores  Register   On: 05/13/2021 12:04   CT Angio Chest Pulmonary Embolism (PE) W or WO Contrast  Result Date: 05/16/2021 CLINICAL DATA:  65 year old male with clinical concern for pulmonary embolism. Follow-up study. EXAM: CT ANGIOGRAPHY CHEST WITH CONTRAST TECHNIQUE: Multidetector CT imaging of the chest was performed using the standard protocol during bolus administration of intravenous contrast. Multiplanar CT image reconstructions and MIPs were obtained to evaluate the vascular anatomy. CONTRAST:  5m OMNIPAQUE IOHEXOL 350 MG/ML SOLN COMPARISON:  Chest CTA 05/13/2021. FINDINGS: Cardiovascular: Again noted are small subsegmental sized filling defects in pulmonary artery branches in the right lower lobe best appreciated on axial images 169-178 of series 5. No other central, lobar or segmental sized filling defects are noted elsewhere in the pulmonary arterial tree. Heart size is normal. There is no significant pericardial fluid, thickening or pericardial calcification. There is aortic atherosclerosis, as well as atherosclerosis of the great vessels of the mediastinum and the coronary arteries, including calcified atherosclerotic plaque in the left main, left anterior descending and left circumflex coronary arteries. Ectasia of ascending thoracic aorta (4.0 cm in  diameter). Mediastinum/Nodes: No pathologically enlarged mediastinal or hilar lymph nodes. Esophagus is unremarkable in appearance. No axillary lymphadenopathy. Lungs/Pleura: Dependent areas of subsegmental atelectasis are noted in the lower lobes of the lungs bilaterally. No acute consolidative airspace disease. No pleural effusions. 3 mm pulmonary nodule in the apex of the left upper lobe (axial image 28 of series 6). No other larger more suspicious appearing pulmonary nodules or masses are noted. Upper Abdomen: Please see dictation for CT of the abdomen and pelvis 05/14/2021 for full description of findings below the diaphragm, which includes a large liver mass. Musculoskeletal: Lytic lesion in L4 with pathologic fracture resulting in 25% loss of posterior vertebral body height and 4 mm of retropulsion of fracture fragments impinging upon the central spinal canal. Review of the MIP images confirms the above findings. IMPRESSION: 1. Study is once again positive for subsegmental sized emboli in the right lower lobe. No larger central,  lobar or segmental sized emboli are noted. 2. Lytic lesion in T4 with pathologic fracture, similar to the prior study. This was better depicted on recent thoracic spine MRI. 3. Aortic atherosclerosis, in addition to left main and 3 vessel coronary artery disease. There is also mild ectasia of the ascending thoracic aorta (4.0 cm in diameter). Recommend annual imaging followup by CTA or MRA. This recommendation follows 2010 ACCF/AHA/AATS/ACR/ASA/SCA/SCAI/SIR/STS/SVM Guidelines for the Diagnosis and Management of Patients with Thoracic Aortic Disease. Circulation. 2010; [id number]. Aortic aneurysm NOS (ICD10-I71.9). Aortic Atherosclerosis (ICD10-I70.0). Electronically Signed   By: Vinnie Langton M.D.   On: 05/16/2021 10:24   CT Angio Chest PE W and/or Wo Contrast  Addendum Date: 05/15/2021   ADDENDUM REPORT: 05/15/2021 10:12 ADDENDUM: Case was reviewed with multiple  radiologists, subsegmental PE in the right lower lobe is possible, however conclusive evaluation is limited due to respiratory motion artifact. If the presence or absence of subsegmental pulmonary embolus would change management, repeat chest CTA could be considered. Electronically Signed   By: Yetta Glassman MD   On: 05/15/2021 10:12   Result Date: 05/15/2021 CLINICAL DATA:  Shortness of breath EXAM: CT ANGIOGRAPHY CHEST WITH CONTRAST TECHNIQUE: Multidetector CT imaging of the chest was performed using the standard protocol during bolus administration of intravenous contrast. Multiplanar CT image reconstructions and MIPs were obtained to evaluate the vascular anatomy. CONTRAST:  55m OMNIPAQUE IOHEXOL 350 MG/ML SOLN COMPARISON:  None. FINDINGS: Cardiovascular: Adequate contrast opacification of the pulmonary arteries. Subsegmental pulmonary embolus seen in the right lower lobe pulmonary arteries. There is normal heart size. No pericardial effusion. 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 Mediastinum/Nodes: No enlarged mediastinal, hilar, or axillary lymph nodes. Thyroid gland, trachea, and esophagus demonstrate no significant findings. Lungs/Pleura: Lungs are clear. No pleural effusion or pneumothorax. No Upper Abdomen: Ill-defined masslike area seen in the right lobe of the liver measuring approximately 6.2 x 5.2 cm with adjacent areas of linear low density. Musculoskeletal: Lytic lesion of the T4 vertebral body with possible soft tissue extension into the left neural foramen. No evidence of retropulsion Review of the MIP images confirms the above findings. IMPRESSION: Positive for subsegmental pulmonary embolus in the right lower lobe. Ill-defined masslike area seen in the right lobe of the liver with adjacent intrahepatic biliary ductal dilation. 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  Possible soft tissue extension into the left neural foramen. Finding could be further evaluated with contrast enhanced MRI of the spine. Critical Value/emergent results were called by telephone at the time of interpretation on 05/13/2021 at 2:00 pm [facility] who verbally acknowledged these results. Electronically Signed: By: LYetta GlassmanMD On: 05/13/2021 14:01   MR BRAIN W WO CONTRAST  Result Date: 05/16/2021 CLINICAL DATA:  Cancer of unknown primary, staging metastatic cancer. EXAM: MRI HEAD WITHOUT AND WITH CONTRAST TECHNIQUE: Multiplanar, multiecho pulse sequences of the brain and surrounding structures were obtained without and with intravenous contrast. CONTRAST:  626mGADAVIST GADOBUTROL 1 MMOL/ML IV SOLN COMPARISON:  None. FINDINGS: Brain: There is no evidence of an acute infarct, intracranial hemorrhage, mass, midline shift, or extra-axial fluid collection. Mild cerebral atrophy is within normal limits for age. No significant white matter disease is seen. No abnormal enhancement is identified. Vascular: Major intracranial vascular flow voids are preserved. Skull and upper cervical spine: Unremarkable bone marrow signal. Sinuses/Orbits: Unremarkable orbits. Paranasal sinuses and mastoid air  cells are clear. Other: None. IMPRESSION: Unremarkable appearance of the brain for age. No evidence of intracranial metastases. Electronically Signed   By: Logan Bores M.D.   On: 05/16/2021 17:38   MR CERVICAL SPINE W WO CONTRAST  Result Date: 05/14/2021 CLINICAL DATA:  Metastatic disease evaluation EXAM: MRI CERVICAL, THORACIC AND LUMBAR SPINE WITHOUT AND WITH CONTRAST TECHNIQUE: Multiplanar and multiecho pulse sequences of the cervical spine, to include the craniocervical junction and cervicothoracic junction, and thoracic and lumbar spine, were obtained without and with intravenous contrast. CONTRAST:  74m GADAVIST GADOBUTROL 1 MMOL/ML IV SOLN COMPARISON:  None. FINDINGS: MRI CERVICAL SPINE  FINDINGS Alignment: Physiologic. Vertebrae: No fracture, evidence of discitis, or bone lesion. Cord: Normal signal and morphology. Posterior Fossa, vertebral arteries, paraspinal tissues: Negative. Disc levels: C1-2: Unremarkable C2-3: Small central disc protrusion. There is no spinal canal stenosis. No neural foraminal stenosis. C3-4: Normal disc space and facet joints. There is no spinal canal stenosis. No neural foraminal stenosis. C4-5: Normal disc space and facet joints. There is no spinal canal stenosis. No neural foraminal stenosis. C5-6: Small central disc protrusion. There is no spinal canal stenosis. No neural foraminal stenosis. C6-7: Normal disc space and facet joints. There is no spinal canal stenosis. No neural foraminal stenosis. C7-T1: Normal disc space and facet joints. There is no spinal canal stenosis. No neural foraminal stenosis. MRI THORACIC SPINE FINDINGS Alignment:  Physiologic. Vertebrae: Diffusely abnormal bone marrow signal at T4 with split type fracture and expansion of the left pedicle. There is diffuse abnormal contrast enhancement. Approximately 25% height loss. Cord:  Normal signal and morphology. Paraspinal and other soft tissues: Negative. Disc levels: MRI LUMBAR SPINE FINDINGS Segmentation:  Standard. Alignment:  Physiologic. Vertebrae:  Hemangioma at L1.  No other osseous lesion. Conus medullaris and cauda equina: Conus extends to the L1 level. Conus and cauda equina appear normal. Paraspinal and other soft tissues: Negative Disc levels: L1-L2: Normal disc space and facet joints. No spinal canal stenosis. No neural foraminal stenosis. L2-L3: Normal disc space and facet joints. No spinal canal stenosis. No neural foraminal stenosis. L3-L4: Small disc bulge. No spinal canal stenosis. No neural foraminal stenosis. L4-L5: Small disc bulge with superimposed central protrusion. Left-greater-than-right lateral recess narrowing without central spinal canal stenosis. No neural foraminal  stenosis. L5-S1: Left asymmetric disc bulge. Left lateral recess narrowing without central spinal canal stenosis. Moderate left neural foraminal stenosis. Visualized sacrum: Normal. IMPRESSION: 1. Pathologic split type fracture of T4 with approximately 25% height loss, likely due to metastatic disease. 2. No other evidence of metastatic disease of the cervical, thoracic or lumbar spine. 3. Moderate left L5-S1 neural foraminal stenosis. 4. Left-greater-than-right lateral recess narrowing at L4-5 and L5-S1. Electronically Signed   By: KUlyses JarredM.D.   On: 05/14/2021 19:32   MR THORACIC SPINE W WO CONTRAST  Result Date: 05/14/2021 CLINICAL DATA:  Metastatic disease evaluation EXAM: MRI CERVICAL, THORACIC AND LUMBAR SPINE WITHOUT AND WITH CONTRAST TECHNIQUE: Multiplanar and multiecho pulse sequences of the cervical spine, to include the craniocervical junction and cervicothoracic junction, and thoracic and lumbar spine, were obtained without and with intravenous contrast. CONTRAST:  657mGADAVIST GADOBUTROL 1 MMOL/ML IV SOLN COMPARISON:  None. FINDINGS: MRI CERVICAL SPINE FINDINGS Alignment: Physiologic. Vertebrae: No fracture, evidence of discitis, or bone lesion. Cord: Normal signal and morphology. Posterior Fossa, vertebral arteries, paraspinal tissues: Negative. Disc levels: C1-2: Unremarkable C2-3: Small central disc protrusion. There is no spinal canal stenosis. No neural foraminal stenosis. C3-4: Normal disc space and facet  joints. There is no spinal canal stenosis. No neural foraminal stenosis. C4-5: Normal disc space and facet joints. There is no spinal canal stenosis. No neural foraminal stenosis. C5-6: Small central disc protrusion. There is no spinal canal stenosis. No neural foraminal stenosis. C6-7: Normal disc space and facet joints. There is no spinal canal stenosis. No neural foraminal stenosis. C7-T1: Normal disc space and facet joints. There is no spinal canal stenosis. No neural foraminal  stenosis. MRI THORACIC SPINE FINDINGS Alignment:  Physiologic. Vertebrae: Diffusely abnormal bone marrow signal at T4 with split type fracture and expansion of the left pedicle. There is diffuse abnormal contrast enhancement. Approximately 25% height loss. Cord:  Normal signal and morphology. Paraspinal and other soft tissues: Negative. Disc levels: MRI LUMBAR SPINE FINDINGS Segmentation:  Standard. Alignment:  Physiologic. Vertebrae:  Hemangioma at L1.  No other osseous lesion. Conus medullaris and cauda equina: Conus extends to the L1 level. Conus and cauda equina appear normal. Paraspinal and other soft tissues: Negative Disc levels: L1-L2: Normal disc space and facet joints. No spinal canal stenosis. No neural foraminal stenosis. L2-L3: Normal disc space and facet joints. No spinal canal stenosis. No neural foraminal stenosis. L3-L4: Small disc bulge. No spinal canal stenosis. No neural foraminal stenosis. L4-L5: Small disc bulge with superimposed central protrusion. Left-greater-than-right lateral recess narrowing without central spinal canal stenosis. No neural foraminal stenosis. L5-S1: Left asymmetric disc bulge. Left lateral recess narrowing without central spinal canal stenosis. Moderate left neural foraminal stenosis. Visualized sacrum: Normal. IMPRESSION: 1. Pathologic split type fracture of T4 with approximately 25% height loss, likely due to metastatic disease. 2. No other evidence of metastatic disease of the cervical, thoracic or lumbar spine. 3. Moderate left L5-S1 neural foraminal stenosis. 4. Left-greater-than-right lateral recess narrowing at L4-5 and L5-S1. Electronically Signed   By: Ulyses Jarred M.D.   On: 05/14/2021 19:32   MR Lumbar Spine W Wo Contrast  Result Date: 05/14/2021 CLINICAL DATA:  Metastatic disease evaluation EXAM: MRI CERVICAL, THORACIC AND LUMBAR SPINE WITHOUT AND WITH CONTRAST TECHNIQUE: Multiplanar and multiecho pulse sequences of the cervical spine, to include the  craniocervical junction and cervicothoracic junction, and thoracic and lumbar spine, were obtained without and with intravenous contrast. CONTRAST:  25m GADAVIST GADOBUTROL 1 MMOL/ML IV SOLN COMPARISON:  None. FINDINGS: MRI CERVICAL SPINE FINDINGS Alignment: Physiologic. Vertebrae: No fracture, evidence of discitis, or bone lesion. Cord: Normal signal and morphology. Posterior Fossa, vertebral arteries, paraspinal tissues: Negative. Disc levels: C1-2: Unremarkable C2-3: Small central disc protrusion. There is no spinal canal stenosis. No neural foraminal stenosis. C3-4: Normal disc space and facet joints. There is no spinal canal stenosis. No neural foraminal stenosis. C4-5: Normal disc space and facet joints. There is no spinal canal stenosis. No neural foraminal stenosis. C5-6: Small central disc protrusion. There is no spinal canal stenosis. No neural foraminal stenosis. C6-7: Normal disc space and facet joints. There is no spinal canal stenosis. No neural foraminal stenosis. C7-T1: Normal disc space and facet joints. There is no spinal canal stenosis. No neural foraminal stenosis. MRI THORACIC SPINE FINDINGS Alignment:  Physiologic. Vertebrae: Diffusely abnormal bone marrow signal at T4 with split type fracture and expansion of the left pedicle. There is diffuse abnormal contrast enhancement. Approximately 25% height loss. Cord:  Normal signal and morphology. Paraspinal and other soft tissues: Negative. Disc levels: MRI LUMBAR SPINE FINDINGS Segmentation:  Standard. Alignment:  Physiologic. Vertebrae:  Hemangioma at L1.  No other osseous lesion. Conus medullaris and cauda equina: Conus extends to the L1  level. Conus and cauda equina appear normal. Paraspinal and other soft tissues: Negative Disc levels: L1-L2: Normal disc space and facet joints. No spinal canal stenosis. No neural foraminal stenosis. L2-L3: Normal disc space and facet joints. No spinal canal stenosis. No neural foraminal stenosis. L3-L4: Small  disc bulge. No spinal canal stenosis. No neural foraminal stenosis. L4-L5: Small disc bulge with superimposed central protrusion. Left-greater-than-right lateral recess narrowing without central spinal canal stenosis. No neural foraminal stenosis. L5-S1: Left asymmetric disc bulge. Left lateral recess narrowing without central spinal canal stenosis. Moderate left neural foraminal stenosis. Visualized sacrum: Normal. IMPRESSION: 1. Pathologic split type fracture of T4 with approximately 25% height loss, likely due to metastatic disease. 2. No other evidence of metastatic disease of the cervical, thoracic or lumbar spine. 3. Moderate left L5-S1 neural foraminal stenosis. 4. Left-greater-than-right lateral recess narrowing at L4-5 and L5-S1. Electronically Signed   By: Ulyses Jarred M.D.   On: 05/14/2021 19:32   US BIOPSY (LIVER)  Result Date: 05/15/2021 INDICATION: 65 year old with evidence of metastatic disease. Patient has a suspicious hepatic lesion and needs a tissue diagnosis. EXAM: ULTRASOUND-GUIDED LIVER LESION BIOPSY MEDICATIONS: Moderate sedation ANESTHESIA/SEDATION: Moderate (conscious) sedation was employed during this procedure. A total of Versed 2.0 mg and Fentanyl 100 mcg was administered intravenously. Moderate Sedation Time: 13 minutes. The patient's level of consciousness and vital signs were monitored continuously by radiology nursing throughout the procedure under my direct supervision. FLUOROSCOPY TIME:  None COMPLICATIONS: None immediate. PROCEDURE: Informed written consent was obtained from the patient after a thorough discussion of the procedural risks, benefits and alternatives. All questions were addressed. Translator was used. A timeout was performed prior to the initiation of the procedure. Liver was evaluated with ultrasound. Lesions in the right hepatic lobe identified. The skin was prepped with chlorhexidine and sterile field was created. Maximal barrier sterile technique was utilized  including caps, mask, sterile gowns, sterile gloves, sterile drape, hand hygiene and skin antiseptic. Skin was anesthetized using 1% lidocaine. Small incision was made. Using ultrasound guidance, 17 gauge coaxial needle was directed into the right hepatic lobe and into a hypoechoic lesion. Total of 3 core biopsies were obtained with an 18 gauge device. Specimens placed in formalin. Gel-Foam slurry was injected along the needle tract. Bandage placed over the puncture site. FINDINGS: Lateral right hepatic lobe has a nodular contour with a geographic area of decreased echogenicity. There is at least 1 well-defined hypoechoic lesion in this area. This hypoechoic lesion was successfully targeted for biopsy. Needle position was confirmed within the lesion. Trace perihepatic ascites noted before and after the procedure. IMPRESSION: Ultrasound-guided core biopsies of a right hepatic lesion. Electronically Signed   By: Markus Daft M.D.   On: 05/15/2021 17:16   VAS [place] LOWER EXTREMITY VENOUS (DVT)  Result Date: 05/14/2021  Lower Venous DVT Study Patient Name:  Jason Lyons  Date of Exam:   05/14/2021 Medical Rec #: [id number]   Accession #:    [id number] Date of Birth: 1955-12-16  Patient Gender: M Patient Age:   64Y Exam Location:  [facility] Procedure:      VAS [place] LOWER EXTREMITY VENOUS (DVT) Referring Phys: Wendee Beavers --------------------------------------------------------------------------------  Indications: Pulmonary embolism.  Anticoagulation: Heparin. Comparison Study: No prior studies. Performing Technologist: Darlin Coco RDMS,RVT  Examination Guidelines: A complete evaluation includes B-mode imaging, spectral Doppler, color Doppler, and power Doppler as needed of all accessible portions of each vessel. Bilateral testing is considered an integral part of a complete examination. Limited examinations  for reoccurring indications may be performed as noted. The reflux portion of the exam is performed with the  patient in reverse Trendelenburg.  +---------+---------------+---------+-----------+----------+--------------+ RIGHT    CompressibilityPhasicitySpontaneityPropertiesThrombus Aging +---------+---------------+---------+-----------+----------+--------------+ CFV      Full           Yes      Yes                                 +---------+---------------+---------+-----------+----------+--------------+ SFJ      Full                                                        +---------+---------------+---------+-----------+----------+--------------+ FV Prox  Full                                                        +---------+---------------+---------+-----------+----------+--------------+ FV Mid   Full                                                        +---------+---------------+---------+-----------+----------+--------------+ FV DistalFull                                                        +---------+---------------+---------+-----------+----------+--------------+ PFV      Full                                                        +---------+---------------+---------+-----------+----------+--------------+ POP      Full           Yes      Yes                                 +---------+---------------+---------+-----------+----------+--------------+ PTV      Full                                                        +---------+---------------+---------+-----------+----------+--------------+ PERO     Full                                                        +---------+---------------+---------+-----------+----------+--------------+   +---------+---------------+---------+-----------+----------+--------------+ LEFT     CompressibilityPhasicitySpontaneityPropertiesThrombus Aging +---------+---------------+---------+-----------+----------+--------------+ CFV      Full  Yes      Yes                                  +---------+---------------+---------+-----------+----------+--------------+ SFJ      Full                                                        +---------+---------------+---------+-----------+----------+--------------+ FV Prox  Full                                                        +---------+---------------+---------+-----------+----------+--------------+ FV Mid   Full                                                        +---------+---------------+---------+-----------+----------+--------------+ FV DistalFull                                                        +---------+---------------+---------+-----------+----------+--------------+ PFV      Full                                                        +---------+---------------+---------+-----------+----------+--------------+ POP      Full           Yes      Yes                                 +---------+---------------+---------+-----------+----------+--------------+ PTV      Full                                                        +---------+---------------+---------+-----------+----------+--------------+ PERO     Full                                                        +---------+---------------+---------+-----------+----------+--------------+     Summary: RIGHT: - There is no evidence of deep vein thrombosis in the lower extremity.  - No cystic structure found in the popliteal fossa.  LEFT: - There is no evidence of deep vein thrombosis in the lower extremity.  - No cystic structure found in the popliteal fossa.  *See table(s) above for measurements and observations. Electronically signed by Servando Snare MD on 05/14/2021 at 3:49:43  PM.    Final     Labs:  CBC: Recent Labs    05/13/21 1128 05/14/21 0528 05/15/21 0513 05/16/21 0240  WBC 6.4 7.2 6.6 6.9  HGB 15.8 16.0 15.6 14.8  HCT 46.3 47.6 47.4 44.9  PLT 223 208 217 220    COAGS: Recent Labs    05/15/21 0833  05/15/21 1227  INR 0.9  --   APTT  --  64*    BMP: Recent Labs    05/14/21 0528 05/14/21 1628 05/16/21 0240 05/17/21 0532  NA 139 139 138 136  K 3.7 3.8 3.8 4.0  CL 104 100 104 105  CO2 29 32 25 25  GLUCOSE 131* 74 138* 126*  BUN '16 17 20 15  '$ CALCIUM 8.7* 8.8* 8.4* 8.9  CREATININE 0.76 0.87 0.75 0.56*  GFRNONAA >60 >60 >60 >60    LIVER FUNCTION TESTS: Recent Labs    05/13/21 2327 05/14/21 1628 05/16/21 0240 05/17/21 0532  BILITOT 0.5 0.6 0.6 0.6  AST 57* 52* 52* 57*  ALT 83* 75* 72* 83*  ALKPHOS 521* 487* 448* 469*  PROT 7.2 6.5 6.2* 6.9  ALBUMIN 3.9 3.5 3.3* 3.7    TUMOR MARKERS: No results for input(s): AFPTM, CEA, CA199, CHROMGRNA in the last 8760 hours.  Assessment and Plan: 65 y.o. male with recent imaging findings suggesting metastatic cancer, with no known primary malignancy.  Oncology was consulted, who recommended chemotherapy for the cancer treatment.  After thorough discussion and shared change making, patient decided to proceed with the chemotherapy.  IR was requested for Port-A-Cath placement.  Risks and benefits of image guided port-a-catheter placement was discussed with the patient including, but not limited to bleeding, infection, pneumothorax, or fibrin sheath development and need for additional procedures.  All of the patient's questions were answered, patient is agreeable to proceed. Consent signed, however, this PA accidentally took the consent home. The consent was scanned via haiku, this PA also informed other IR APP and asked to obtain a new consent if the scanned consent is not sufficient.  Risks and benefits of image guided port-a-catheter placement was discussed with the patient including, but not limited to bleeding, infection, pneumothorax, or fibrin sheath development and need for additional procedures.  All of the patient's questions were answered, patient is agreeable to proceed.  The procedure is tentatively scheduled for  tomorrow pending IR schedule.    Patient was also informed that IR may not be able to accommodate the procedure tomorrow, and if he were to be discharged before IR can place the Port-A-Cath, Port-A-Cath placement can be done as an outpatient basis.   Patient verbalized understanding.   Made n.p.o. midnight VSS   Thank you for this interesting consult.  I greatly enjoyed meeting Jason Lyons and look forward to participating in their care.  A copy of this report was sent to the requesting provider on this date.  Electronically Signed: Tera Mater, PA-C 05/17/2021, 8:18 PM   I spent a total of 40 Minutes    in face to face in clinical consultation, greater than 50% of which was counseling/coordinating care for Port-A-Cath placement

## 2021-05-17 NOTE — Progress Notes (Signed)
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, unknown primary, suspicious for primary bile duct cancer or gallbladder cancer  MRI of the brain showed no evidence of intracranial metastasis Pathology result from liver biopsy is pending CA19-9 is elevated, suspicious for primary bile duct cancer or gallbladder cancer I explained to the patient and family members why surgery is not indicated with stage IV disease I explained to them radiation could be helpful for metastatic focus of his back but not for primary treatment and I will arrange that as an outpatient I will arrange outpatient follow-up end of next week to review results of biopsy and to discuss side effects of chemotherapy The patient is interested to receive chemotherapy He has poor venous access I will arrange port placement prior to discharge  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 He is started on oxycodone as needed The addition of dexamethasone is helpful He has no focal neurological deficit If his pain is well controlled, he can get outpatient palliative radiation therapy and kyphoplasty   Right lower lobe pulmonary embolism CT angiogram confirmed right lower lobe PE This is likely caused by malignancy I recommend transition him back to IV heparin in anticipation for port placement and then switch him to either Eliquis or Xarelto upon discharge   Mild constipation Likely due to poor oral intake and slight dehydration I will start him on gentle IV fluids and laxatives   Chronic cigarette smoking The patient is willing to quit He has been started on nicotine patch   Goals of care discussion Pain under control and completion of staging I told the patient and family members that he has stage IV disease and explained in easy terms why it is not considered curable We discussed prognosis with or without chemotherapy and the patient is  interested to pursue palliative chemotherapy in the outpatient   Discharge planning He can probably be discharged tomorrow  All questions were answered. The patient knows to call the clinic with any problems, questions or concerns. Language barrier is detected; formal interpreter services is used to communicate with family The total time spent in the appointment was 80 minutes encounter with patients including review of chart and various tests results, discussions about plan of care and coordination of care plan  Heath Lark, MD 05/17/2021 10:38 AM  Subjective:  [place] interpreter services used His wife and 2 sons are present by the bedside His pain is better controlled  Objective:  Vitals:   05/16/21 2102 05/17/21 0415  BP: 136/82 124/72  Pulse: 63 63  Resp: 20 16  Temp: 98.1 F (36.7 C) 97.9 F (36.6 C)  SpO2: 98% 95%     Intake/Output Summary (Last 24 hours) at 05/17/2021 1038 Last data filed at 05/17/2021 1000 Gross per 24 hour  Intake 1965.4 ml  Output --  Net 1965.4 ml    GENERAL:alert, no distress and comfortable NEURO: alert & oriented x 3 with fluent speech, no focal motor/sensory deficits   Labs:  Recent Labs    05/13/21 1500 05/13/21 2327 05/14/21 0528 05/14/21 1628 05/16/21 0240 05/17/21 0532  NA  --   --    < > 139 138 136  K  --   --    < > 3.8 3.8 4.0  CL  --   --    < > 100 104 105  CO2  --   --    < >  32 25 25  GLUCOSE  --   --    < > 74 138* 126*  BUN  --   --    < > '17 20 15  '$ CREATININE  --   --    < > 0.87 0.75 0.56*  CALCIUM  --   --    < > 8.8* 8.4* 8.9  GFRNONAA  --   --    < > >60 >60 >60  PROT 7.1 7.2  --  6.5 6.2* 6.9  ALBUMIN 4.1 3.9  --  3.5 3.3* 3.7  AST 61* 57*  --  52* 52* 57*  ALT 85* 83*  --  75* 72* 83*  ALKPHOS 578* 521*  --  487* 448* 469*  BILITOT 0.9 0.5  --  0.6 0.6 0.6  BILIDIR 0.1 <0.1  --   --   --   --   IBILI 0.8 NOT CALCULATED  --   --   --   --    < > = values in this interval not displayed.    Studies:   CT ABDOMEN PELVIS W WO CONTRAST  Result Date: 05/15/2021 CLINICAL DATA:  Masslike area identified in the liver on recent CTA Chest. EXAM: CT ABDOMEN AND PELVIS WITHOUT AND WITH CONTRAST TECHNIQUE: Multidetector CT imaging of the abdomen and pelvis was performed following the standard protocol before and following the bolus administration of intravenous contrast. CONTRAST:  182m OMNIPAQUE IOHEXOL 350 MG/ML SOLN COMPARISON:  CTA Chest 05/13/2021 FINDINGS: Lower chest: Dependent atelectasis in both lower lobes. Hepatobiliary: 5.0 x 3.3 x 4.5 cm ill-defined irregular low-density lesion is identified in the anterior right liver inferiorly. Arterial phase imaging shows subtle irregular peripheral enhancement with heterogeneous enhancement in the lesion inferiorly. Lesion takes on a more infiltrative appearance at the inferior tip of the liver extending more posteriorly. The mass tracks along the gallbladder fossa towards the fundus in the gallbladder is contracted and irregular in appearance at this location. Focal biliary dilatation is seen in the anterior right liver (segment VIII) into a lesser degree in the posterior right liver (segments VI and VII). There is abrupt cut off of the dilated bile ducts along the medial aspect of the right hepatic lobe lesion. Similarly, portal venous anatomy appears obliterated centrally in the anterior right liver along the superomedial margin of the lesion. As above, gallbladder appears irregular with diffuse wall thickening. No extrahepatic biliary duct dilatation. Pancreas: No focal mass lesion. No dilatation of the main duct. No intraparenchymal cyst. No peripancreatic edema. Spleen: No splenomegaly. No focal mass lesion. Adrenals/Urinary Tract: No adrenal nodule or mass. Kidneys unremarkable. No evidence for hydroureter. The urinary bladder appears normal for the degree of distention. Stomach/Bowel: Stomach is unremarkable. No gastric wall thickening. No evidence of outlet  obstruction. Duodenum is normally positioned as is the ligament of Treitz. No small bowel wall thickening. No small bowel dilatation. The terminal ileum is normal. The appendix is normal. No gross colonic mass. No colonic wall thickening. Vascular/Lymphatic: Insert calcium aorta upper normal lymph nodes are seen in the hepatoduodenal ligament. No retroperitoneal lymphadenopathy No pelvic sidewall lymphadenopathy. Reproductive: Prostate gland is enlarged with heterogeneous enhancement and dystrophic calcification. Other: Trace free fluid is noted in the pelvis. Musculoskeletal: No worrisome lytic or sclerotic osseous abnormality. IMPRESSION: 1. 5 cm ill-defined irregular low-density lesion in the anterior right liver inferiorly is highly suspicious for primary or metastatic neoplasm. The lesion obliterates bile ducts in the central right liver resulting in right hepatic lobe  biliary dilatation. Similarly, portal venous anatomy in the anterior right liver is involved along the superomedial margin of the lesion. Lesion should be amenable to tissue sampling. 2. Gallbladder wall is contracted and irregular in appearance adjacent to the lesion. Imaging features suggest that gallbladder changes are secondary although primary gallbladder neoplasm not excluded. 3. 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 No evidence for additional metastatic disease in the abdomen or pelvis. 4. Prostatomegaly. 5. Trace free fluid in the pelvis. 6. Aortic Atherosclerosis (ICD10-I70.0). Electronically Signed   By: Misty Stanley M.D.   On: 05/15/2021 05:53   DG Chest 2 View  Result Date: 05/13/2021 CLINICAL DATA:  Chest pain. EXAM: CHEST - 2 VIEW COMPARISON:  No prior. FINDINGS: Mediastinum and hilar structures normal. Low lung volumes with mild bibasilar atelectasis. No pleural effusion or pneumothorax. Heart size normal. Mild thoracic spine scoliosis. No acute bony abnormality. IMPRESSION: Low lung volumes with mild bibasilar  atelectasis. Electronically Signed   By: Marcello Moores  Register   On: 05/13/2021 12:04   CT Angio Chest Pulmonary Embolism (PE) W or WO Contrast  Result Date: 05/16/2021 CLINICAL DATA:  65 year old male with clinical concern for pulmonary embolism. Follow-up study. EXAM: CT ANGIOGRAPHY CHEST WITH CONTRAST TECHNIQUE: Multidetector CT imaging of the chest was performed using the standard protocol during bolus administration of intravenous contrast. Multiplanar CT image reconstructions and MIPs were obtained to evaluate the vascular anatomy. CONTRAST:  24m OMNIPAQUE IOHEXOL 350 MG/ML SOLN COMPARISON:  Chest CTA 05/13/2021. FINDINGS: Cardiovascular: Again noted are small subsegmental sized filling defects in pulmonary artery branches in the right lower lobe best appreciated on axial images 169-178 of series 5. No other central, lobar or segmental sized filling defects are noted elsewhere in the pulmonary arterial tree. Heart size is normal. There is no significant pericardial fluid, thickening or pericardial calcification. There is aortic atherosclerosis, as well as atherosclerosis of the great vessels of the mediastinum and the coronary arteries, including calcified atherosclerotic plaque in the left main, left anterior descending and left circumflex coronary arteries. Ectasia of ascending thoracic aorta (4.0 cm in diameter). Mediastinum/Nodes: No pathologically enlarged mediastinal or hilar lymph nodes. Esophagus is unremarkable in appearance. No axillary lymphadenopathy. Lungs/Pleura: Dependent areas of subsegmental atelectasis are noted in the lower lobes of the lungs bilaterally. No acute consolidative airspace disease. No pleural effusions. 3 mm pulmonary nodule in the apex of the left upper lobe (axial image 28 of series 6). No other larger more suspicious appearing pulmonary nodules or masses are noted. Upper Abdomen: Please see dictation for CT of the abdomen and pelvis 05/14/2021 for full description of findings  below the diaphragm, which includes a large liver mass. Musculoskeletal: Lytic lesion in L4 with pathologic fracture resulting in 25% loss of posterior vertebral body height and 4 mm of retropulsion of fracture fragments impinging upon the central spinal canal. Review of the MIP images confirms the above findings. IMPRESSION: 1. Study is once again positive for subsegmental sized emboli in the right lower lobe. No larger central, lobar or segmental sized emboli are noted. 2. Lytic lesion in T4 with pathologic fracture, similar to the prior study. This was better depicted on recent thoracic spine MRI. 3. Aortic atherosclerosis, in addition to left main and 3 vessel coronary artery disease. There is also mild ectasia of the ascending thoracic aorta (4.0 cm in diameter). Recommend annual imaging followup by CTA or MRA. This recommendation follows 2010 ACCF/AHA/AATS/ACR/ASA/SCA/SCAI/SIR/STS/SVM Guidelines for the Diagnosis and Management of Patients with Thoracic Aortic Disease. Circulation.  2010; [id number]. Aortic aneurysm NOS (ICD10-I71.9). Aortic Atherosclerosis (ICD10-I70.0). Electronically Signed   By: Vinnie Langton M.D.   On: 05/16/2021 10:24   CT Angio Chest PE W and/or Wo Contrast  Addendum Date: 05/15/2021   ADDENDUM REPORT: 05/15/2021 10:12 ADDENDUM: Case was reviewed with multiple radiologists, subsegmental PE in the right lower lobe is possible, however conclusive evaluation is limited due to respiratory motion artifact. If the presence or absence of subsegmental pulmonary embolus would change management, repeat chest CTA could be considered. Electronically Signed   By: Yetta Glassman MD   On: 05/15/2021 10:12   Result Date: 05/15/2021 CLINICAL DATA:  Shortness of breath EXAM: CT ANGIOGRAPHY CHEST WITH CONTRAST TECHNIQUE: Multidetector CT imaging of the chest was performed using the standard protocol during bolus administration of intravenous contrast. Multiplanar CT image reconstructions and  MIPs were obtained to evaluate the vascular anatomy. CONTRAST:  45m OMNIPAQUE IOHEXOL 350 MG/ML SOLN COMPARISON:  None. FINDINGS: Cardiovascular: Adequate contrast opacification of the pulmonary arteries. Subsegmental pulmonary embolus seen in the right lower lobe pulmonary arteries. There is normal heart size. No pericardial effusion. 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 Mediastinum/Nodes: No enlarged mediastinal, hilar, or axillary lymph nodes. Thyroid gland, trachea, and esophagus demonstrate no significant findings. Lungs/Pleura: Lungs are clear. No pleural effusion or pneumothorax. No Upper Abdomen: Ill-defined masslike area seen in the right lobe of the liver measuring approximately 6.2 x 5.2 cm with adjacent areas of linear low density. Musculoskeletal: Lytic lesion of the T4 vertebral body with possible soft tissue extension into the left neural foramen. No evidence of retropulsion Review of the MIP images confirms the above findings. IMPRESSION: Positive for subsegmental pulmonary embolus in the right lower lobe. Ill-defined masslike area seen in the right lobe of the liver with adjacent intrahepatic biliary ductal dilation. 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 Possible soft tissue extension into the left neural foramen. Finding could be further evaluated with contrast enhanced MRI of the spine. Critical Value/emergent results were called by telephone at the time of interpretation on 05/13/2021 at 2:00 pm [facility] verbally acknowledged these results. Electronically Signed: By: LYetta GlassmanMD On: 05/13/2021 14:01   MR BRAIN W WO CONTRAST  Result Date: 05/16/2021 CLINICAL DATA:  Cancer of unknown primary, staging metastatic cancer. EXAM: MRI HEAD WITHOUT AND WITH CONTRAST TECHNIQUE: Multiplanar, multiecho pulse sequences of the brain and  surrounding structures were obtained without and with intravenous contrast. CONTRAST:  646mGADAVIST GADOBUTROL 1 MMOL/ML IV SOLN COMPARISON:  None. FINDINGS: Brain: There is no evidence of an acute infarct, intracranial hemorrhage, mass, midline shift, or extra-axial fluid collection. Mild cerebral atrophy is within normal limits for age. No significant white matter disease is seen. No abnormal enhancement is identified. Vascular: Major intracranial vascular flow voids are preserved. Skull and upper cervical spine: Unremarkable bone marrow signal. Sinuses/Orbits: Unremarkable orbits. Paranasal sinuses and mastoid air cells are clear. Other: None. IMPRESSION: Unremarkable appearance of the brain for age. No evidence of intracranial metastases. Electronically Signed   By: AlLogan Bores.D.   On: 05/16/2021 17:38   MR CERVICAL SPINE W WO CONTRAST  Result Date: 05/14/2021 CLINICAL DATA:  Metastatic disease evaluation EXAM: MRI CERVICAL, THORACIC AND LUMBAR SPINE WITHOUT AND WITH CONTRAST TECHNIQUE: Multiplanar and multiecho pulse sequences of the cervical spine, to include the craniocervical junction and cervicothoracic junction, and thoracic and lumbar  spine, were obtained without and with intravenous contrast. CONTRAST:  25m GADAVIST GADOBUTROL 1 MMOL/ML IV SOLN COMPARISON:  None. FINDINGS: MRI CERVICAL SPINE FINDINGS Alignment: Physiologic. Vertebrae: No fracture, evidence of discitis, or bone lesion. Cord: Normal signal and morphology. Posterior Fossa, vertebral arteries, paraspinal tissues: Negative. Disc levels: C1-2: Unremarkable C2-3: Small central disc protrusion. There is no spinal canal stenosis. No neural foraminal stenosis. C3-4: Normal disc space and facet joints. There is no spinal canal stenosis. No neural foraminal stenosis. C4-5: Normal disc space and facet joints. There is no spinal canal stenosis. No neural foraminal stenosis. C5-6: Small central disc protrusion. There is no spinal canal stenosis.  No neural foraminal stenosis. C6-7: Normal disc space and facet joints. There is no spinal canal stenosis. No neural foraminal stenosis. C7-T1: Normal disc space and facet joints. There is no spinal canal stenosis. No neural foraminal stenosis. MRI THORACIC SPINE FINDINGS Alignment:  Physiologic. Vertebrae: Diffusely abnormal bone marrow signal at T4 with split type fracture and expansion of the left pedicle. There is diffuse abnormal contrast enhancement. Approximately 25% height loss. Cord:  Normal signal and morphology. Paraspinal and other soft tissues: Negative. Disc levels: MRI LUMBAR SPINE FINDINGS Segmentation:  Standard. Alignment:  Physiologic. Vertebrae:  Hemangioma at L1.  No other osseous lesion. Conus medullaris and cauda equina: Conus extends to the L1 level. Conus and cauda equina appear normal. Paraspinal and other soft tissues: Negative Disc levels: L1-L2: Normal disc space and facet joints. No spinal canal stenosis. No neural foraminal stenosis. L2-L3: Normal disc space and facet joints. No spinal canal stenosis. No neural foraminal stenosis. L3-L4: Small disc bulge. No spinal canal stenosis. No neural foraminal stenosis. L4-L5: Small disc bulge with superimposed central protrusion. Left-greater-than-right lateral recess narrowing without central spinal canal stenosis. No neural foraminal stenosis. L5-S1: Left asymmetric disc bulge. Left lateral recess narrowing without central spinal canal stenosis. Moderate left neural foraminal stenosis. Visualized sacrum: Normal. IMPRESSION: 1. Pathologic split type fracture of T4 with approximately 25% height loss, likely due to metastatic disease. 2. No other evidence of metastatic disease of the cervical, thoracic or lumbar spine. 3. Moderate left L5-S1 neural foraminal stenosis. 4. Left-greater-than-right lateral recess narrowing at L4-5 and L5-S1. Electronically Signed   By: KUlyses JarredM.D.   On: 05/14/2021 19:32   MR THORACIC SPINE W WO  CONTRAST  Result Date: 05/14/2021 CLINICAL DATA:  Metastatic disease evaluation EXAM: MRI CERVICAL, THORACIC AND LUMBAR SPINE WITHOUT AND WITH CONTRAST TECHNIQUE: Multiplanar and multiecho pulse sequences of the cervical spine, to include the craniocervical junction and cervicothoracic junction, and thoracic and lumbar spine, were obtained without and with intravenous contrast. CONTRAST:  640mGADAVIST GADOBUTROL 1 MMOL/ML IV SOLN COMPARISON:  None. FINDINGS: MRI CERVICAL SPINE FINDINGS Alignment: Physiologic. Vertebrae: No fracture, evidence of discitis, or bone lesion. Cord: Normal signal and morphology. Posterior Fossa, vertebral arteries, paraspinal tissues: Negative. Disc levels: C1-2: Unremarkable C2-3: Small central disc protrusion. There is no spinal canal stenosis. No neural foraminal stenosis. C3-4: Normal disc space and facet joints. There is no spinal canal stenosis. No neural foraminal stenosis. C4-5: Normal disc space and facet joints. There is no spinal canal stenosis. No neural foraminal stenosis. C5-6: Small central disc protrusion. There is no spinal canal stenosis. No neural foraminal stenosis. C6-7: Normal disc space and facet joints. There is no spinal canal stenosis. No neural foraminal stenosis. C7-T1: Normal disc space and facet joints. There is no spinal canal stenosis. No neural foraminal stenosis. MRI THORACIC SPINE FINDINGS Alignment:  Physiologic. Vertebrae: Diffusely abnormal bone marrow signal at T4 with split type fracture and expansion of the left pedicle. There is diffuse abnormal contrast enhancement. Approximately 25% height loss. Cord:  Normal signal and morphology. Paraspinal and other soft tissues: Negative. Disc levels: MRI LUMBAR SPINE FINDINGS Segmentation:  Standard. Alignment:  Physiologic. Vertebrae:  Hemangioma at L1.  No other osseous lesion. Conus medullaris and cauda equina: Conus extends to the L1 level. Conus and cauda equina appear normal. Paraspinal and other soft  tissues: Negative Disc levels: L1-L2: Normal disc space and facet joints. No spinal canal stenosis. No neural foraminal stenosis. L2-L3: Normal disc space and facet joints. No spinal canal stenosis. No neural foraminal stenosis. L3-L4: Small disc bulge. No spinal canal stenosis. No neural foraminal stenosis. L4-L5: Small disc bulge with superimposed central protrusion. Left-greater-than-right lateral recess narrowing without central spinal canal stenosis. No neural foraminal stenosis. L5-S1: Left asymmetric disc bulge. Left lateral recess narrowing without central spinal canal stenosis. Moderate left neural foraminal stenosis. Visualized sacrum: Normal. IMPRESSION: 1. Pathologic split type fracture of T4 with approximately 25% height loss, likely due to metastatic disease. 2. No other evidence of metastatic disease of the cervical, thoracic or lumbar spine. 3. Moderate left L5-S1 neural foraminal stenosis. 4. Left-greater-than-right lateral recess narrowing at L4-5 and L5-S1. Electronically Signed   By: Ulyses Jarred M.D.   On: 05/14/2021 19:32   MR Lumbar Spine W Wo Contrast  Result Date: 05/14/2021 CLINICAL DATA:  Metastatic disease evaluation EXAM: MRI CERVICAL, THORACIC AND LUMBAR SPINE WITHOUT AND WITH CONTRAST TECHNIQUE: Multiplanar and multiecho pulse sequences of the cervical spine, to include the craniocervical junction and cervicothoracic junction, and thoracic and lumbar spine, were obtained without and with intravenous contrast. CONTRAST:  65m GADAVIST GADOBUTROL 1 MMOL/ML IV SOLN COMPARISON:  None. FINDINGS: MRI CERVICAL SPINE FINDINGS Alignment: Physiologic. Vertebrae: No fracture, evidence of discitis, or bone lesion. Cord: Normal signal and morphology. Posterior Fossa, vertebral arteries, paraspinal tissues: Negative. Disc levels: C1-2: Unremarkable C2-3: Small central disc protrusion. There is no spinal canal stenosis. No neural foraminal stenosis. C3-4: Normal disc space and facet joints. There  is no spinal canal stenosis. No neural foraminal stenosis. C4-5: Normal disc space and facet joints. There is no spinal canal stenosis. No neural foraminal stenosis. C5-6: Small central disc protrusion. There is no spinal canal stenosis. No neural foraminal stenosis. C6-7: Normal disc space and facet joints. There is no spinal canal stenosis. No neural foraminal stenosis. C7-T1: Normal disc space and facet joints. There is no spinal canal stenosis. No neural foraminal stenosis. MRI THORACIC SPINE FINDINGS Alignment:  Physiologic. Vertebrae: Diffusely abnormal bone marrow signal at T4 with split type fracture and expansion of the left pedicle. There is diffuse abnormal contrast enhancement. Approximately 25% height loss. Cord:  Normal signal and morphology. Paraspinal and other soft tissues: Negative. Disc levels: MRI LUMBAR SPINE FINDINGS Segmentation:  Standard. Alignment:  Physiologic. Vertebrae:  Hemangioma at L1.  No other osseous lesion. Conus medullaris and cauda equina: Conus extends to the L1 level. Conus and cauda equina appear normal. Paraspinal and other soft tissues: Negative Disc levels: L1-L2: Normal disc space and facet joints. No spinal canal stenosis. No neural foraminal stenosis. L2-L3: Normal disc space and facet joints. No spinal canal stenosis. No neural foraminal stenosis. L3-L4: Small disc bulge. No spinal canal stenosis. No neural foraminal stenosis. L4-L5: Small disc bulge with superimposed central protrusion. Left-greater-than-right lateral recess narrowing without central spinal canal stenosis. No neural foraminal stenosis. L5-S1: Left asymmetric disc bulge.  Left lateral recess narrowing without central spinal canal stenosis. Moderate left neural foraminal stenosis. Visualized sacrum: Normal. IMPRESSION: 1. Pathologic split type fracture of T4 with approximately 25% height loss, likely due to metastatic disease. 2. No other evidence of metastatic disease of the cervical, thoracic or lumbar  spine. 3. Moderate left L5-S1 neural foraminal stenosis. 4. Left-greater-than-right lateral recess narrowing at L4-5 and L5-S1. Electronically Signed   By: Ulyses Jarred M.D.   On: 05/14/2021 19:32   US BIOPSY (LIVER)  Result Date: 05/15/2021 INDICATION: 65 year old with evidence of metastatic disease. Patient has a suspicious hepatic lesion and needs a tissue diagnosis. EXAM: ULTRASOUND-GUIDED LIVER LESION BIOPSY MEDICATIONS: Moderate sedation ANESTHESIA/SEDATION: Moderate (conscious) sedation was employed during this procedure. A total of Versed 2.0 mg and Fentanyl 100 mcg was administered intravenously. Moderate Sedation Time: 13 minutes. The patient's level of consciousness and vital signs were monitored continuously by radiology nursing throughout the procedure under my direct supervision. FLUOROSCOPY TIME:  None COMPLICATIONS: None immediate. PROCEDURE: Informed written consent was obtained from the patient after a thorough discussion of the procedural risks, benefits and alternatives. All questions were addressed. Translator was used. A timeout was performed prior to the initiation of the procedure. Liver was evaluated with ultrasound. Lesions in the right hepatic lobe identified. The skin was prepped with chlorhexidine and sterile field was created. Maximal barrier sterile technique was utilized including caps, mask, sterile gowns, sterile gloves, sterile drape, hand hygiene and skin antiseptic. Skin was anesthetized using 1% lidocaine. Small incision was made. Using ultrasound guidance, 17 gauge coaxial needle was directed into the right hepatic lobe and into a hypoechoic lesion. Total of 3 core biopsies were obtained with an 18 gauge device. Specimens placed in formalin. Gel-Foam slurry was injected along the needle tract. Bandage placed over the puncture site. FINDINGS: Lateral right hepatic lobe has a nodular contour with a geographic area of decreased echogenicity. There is at least 1 well-defined  hypoechoic lesion in this area. This hypoechoic lesion was successfully targeted for biopsy. Needle position was confirmed within the lesion. Trace perihepatic ascites noted before and after the procedure. IMPRESSION: Ultrasound-guided core biopsies of a right hepatic lesion. Electronically Signed   By: Markus Daft M.D.   On: 05/15/2021 17:16   VAS [place] LOWER EXTREMITY VENOUS (DVT)  Result Date: 05/14/2021  Lower Venous DVT Study Patient Name:  [facility]  Date of Exam:   05/14/2021 Medical Rec #: [id number]   Accession #:    [id number] Date of Birth: 24-Jul-1956  Patient Gender: M Patient Age:   64Y Exam Location:  [facility] Procedure:      VAS [place] LOWER EXTREMITY VENOUS (DVT) Referring Phys: Wendee Beavers --------------------------------------------------------------------------------  Indications: Pulmonary embolism.  Anticoagulation: Heparin. Comparison Study: No prior studies. Performing Technologist: Darlin Coco RDMS,RVT  Examination Guidelines: A complete evaluation includes B-mode imaging, spectral Doppler, color Doppler, and power Doppler as needed of all accessible portions of each vessel. Bilateral testing is considered an integral part of a complete examination. Limited examinations for reoccurring indications may be performed as noted. The reflux portion of the exam is performed with the patient in reverse Trendelenburg.  +---------+---------------+---------+-----------+----------+--------------+ RIGHT    CompressibilityPhasicitySpontaneityPropertiesThrombus Aging +---------+---------------+---------+-----------+----------+--------------+ CFV      Full           Yes      Yes                                 +---------+---------------+---------+-----------+----------+--------------+  SFJ      Full                                                        +---------+---------------+---------+-----------+----------+--------------+ FV Prox  Full                                                         +---------+---------------+---------+-----------+----------+--------------+ FV Mid   Full                                                        +---------+---------------+---------+-----------+----------+--------------+ FV DistalFull                                                        +---------+---------------+---------+-----------+----------+--------------+ PFV      Full                                                        +---------+---------------+---------+-----------+----------+--------------+ POP      Full           Yes      Yes                                 +---------+---------------+---------+-----------+----------+--------------+ PTV      Full                                                        +---------+---------------+---------+-----------+----------+--------------+ PERO     Full                                                        +---------+---------------+---------+-----------+----------+--------------+   +---------+---------------+---------+-----------+----------+--------------+ LEFT     CompressibilityPhasicitySpontaneityPropertiesThrombus Aging +---------+---------------+---------+-----------+----------+--------------+ CFV      Full           Yes      Yes                                 +---------+---------------+---------+-----------+----------+--------------+ SFJ      Full                                                        +---------+---------------+---------+-----------+----------+--------------+  FV Prox  Full                                                        +---------+---------------+---------+-----------+----------+--------------+ FV Mid   Full                                                        +---------+---------------+---------+-----------+----------+--------------+ FV DistalFull                                                         +---------+---------------+---------+-----------+----------+--------------+ PFV      Full                                                        +---------+---------------+---------+-----------+----------+--------------+ POP      Full           Yes      Yes                                 +---------+---------------+---------+-----------+----------+--------------+ PTV      Full                                                        +---------+---------------+---------+-----------+----------+--------------+ PERO     Full                                                        +---------+---------------+---------+-----------+----------+--------------+     Summary: RIGHT: - There is no evidence of deep vein thrombosis in the lower extremity.  - No cystic structure found in the popliteal fossa.  LEFT: - There is no evidence of deep vein thrombosis in the lower extremity.  - No cystic structure found in the popliteal fossa.  *See table(s) above for measurements and observations. Electronically signed by Servando Snare MD on 05/14/2021 at 3:49:43 PM.    Final

## 2021-05-18 LAB — CBC
HCT: 47.8 % (ref 39.0–52.0)
Hemoglobin: 15.7 g/dL (ref 13.0–17.0)
MCH: 31.3 pg (ref 26.0–34.0)
MCHC: 32.8 g/dL (ref 30.0–36.0)
MCV: 95.2 fL (ref 80.0–100.0)
Platelets: 250 10*3/uL (ref 150–400)
RBC: 5.02 MIL/uL (ref 4.22–5.81)
RDW: 15.4 % (ref 11.5–15.5)
WBC: 18.3 10*3/uL — ABNORMAL HIGH (ref 4.0–10.5)
nRBC: 0 % (ref 0.0–0.2)

## 2021-05-18 LAB — HEPARIN LEVEL (UNFRACTIONATED)
Heparin Unfractionated: 0.64 IU/mL (ref 0.30–0.70)
Heparin Unfractionated: 0.35 IU/mL (ref 0.30–0.70)

## 2021-05-18 LAB — CANCER ANTIGEN 19-9: CA 19-9: 2246 U/mL — ABNORMAL HIGH (ref 0–35)

## 2021-05-18 MED ORDER — DEXAMETHASONE 4 MG PO TABS: 4.0000 mg | ORAL_TABLET | Freq: Three times a day (TID) | ORAL | 0 refills | Status: DC

## 2021-05-18 MED ORDER — APIXABAN 5 MG PO TABS: 5.0000 mg | ORAL_TABLET | Freq: Two times a day (BID) | ORAL | 1 refills | Status: DC

## 2021-05-18 MED ORDER — OXYCODONE HCL 5 MG PO TABS: 5.0000 mg | ORAL_TABLET | Freq: Four times a day (QID) | ORAL | 0 refills | Status: AC | PRN

## 2021-05-18 MED ORDER — OXYCODONE HCL 5 MG PO TABS
5.0000 mg | ORAL_TABLET | ORAL | Status: DC | PRN
  Administered 2021-05-18 – 2021-05-19 (×5): 5 mg via ORAL
  Filled 2021-05-18 (×5): qty 1

## 2021-05-18 MED ORDER — HEPARIN (PORCINE) 25000 UT/250ML-% IV SOLN
1150.0000 [IU]/h | INTRAVENOUS | Status: DC
  Administered 2021-05-18: 1150 [IU]/h via INTRAVENOUS
  Filled 2021-05-18: qty 250

## 2021-05-18 MED ORDER — APIXABAN 5 MG PO TABS: 5.0000 mg | ORAL_TABLET | Freq: Two times a day (BID) | ORAL | Status: DC

## 2021-05-18 NOTE — Progress Notes (Addendum)
ANTICOAGULATION CONSULT NOTE  Pharmacy Consult for heparin Indication: acute pulmonary embolus  No Known Allergies  Patient Measurements: Height: '5\' 2"'$  (157.5 cm) Weight: 61.2 kg (135 lb) IBW/kg (Calculated) : 54.6 Heparin Dosing Weight: 61 kg  Vital Signs: Temp: 97.4 F (36.3 C) (08/08 0508) Temp Source: Oral (08/08 0508) BP: 128/78 (08/08 0508) Pulse Rate: 51 (08/08 0508)  Labs: Recent Labs    05/15/21 0833 05/15/21 1227 05/15/21 1227 05/16/21 0240 05/16/21 1103 05/17/21 0532 05/17/21 2014 05/18/21 0438  HGB  --   --   --  14.8  --   --   --  15.7  HCT  --   --   --  44.9  --   --   --  47.8  PLT  --   --   --  220  --   --   --  250  APTT  --  64*  --   --   --   --   --   --   LABPROT 12.1  --   --   --   --   --   --   --   INR 0.9  --   --   --   --   --   --   --   HEPARINUNFRC  --  0.37   < > 0.30 0.44  --  0.52 0.64  CREATININE  --   --   --  0.75  --  0.56*  --   --    < > = values in this interval not displayed.    Estimated Creatinine Clearance: 70.1 mL/min (A) (by C-G formula based on SCr of 0.56 mg/dL (L)).  Assessment: Patient is a 65 y.o M presented to the ED on 8/3 with c/o back and chest pain.  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  Chest CT on 8/3 showed findings with concern for subsegmental PE in the right lower lobe, but not confirmed due to respiratory motion artifact. LE doppler on 8/4 was negativefor DVT. Repeat chect CT on 8/6 resulted back "positive for subsegmental sized emboli in the right lower lobe." He has been treted with heparin and Lovenox since 8/3. Pharmacy has been reconsulted to transition back to heparin infusion.  Today, 05/18/2021: - Last heparin level remains therapeutic at 0.64. - IR to place port - CBC stable - No bleeding documented  Goal of Therapy:  Heparin level 0.3-0.7 units/ml Monitor platelets by anticoagulation protocol: Yes   Plan:  Continue heparin drip at 1150 units/hr   Monitor daily heparin level, CBC, s/s bleeding F/U long-term anticoagulation plan s/p port placement  Elenor Quinones, PharmD, BCPS, BCIDP Clinical Pharmacist 05/18/2021 7:35 AM  Addendum:   Heparin drip stopped at 1545 for transition to Eliquis and discharge. Plan changed to resume heparin infusion in anticipation of IR port placement in AM. Will check a HL 6 hours after resuming infusion.   Ulice Dash D 05/18/21 4:24 PM

## 2021-05-18 NOTE — Plan of Care (Signed)

## 2021-05-18 NOTE — Progress Notes (Signed)
PROGRESS NOTE  Grayson Suhre [id number] DOB: 19-Sep-1956   PCP: Pcp, No  Patient is from: Home.  DOA: 05/13/2021 LOS: 5  Chief complaints:  Chief Complaint  Patient presents with   Back Pain     Brief Narrative / Interim history: 65 year old [place] speaking male with history of tobacco abuse presenting with left-sided chest pain and mid back pain for about a months, and admitted with working diagnosis of RLL subsegmental PE, 6.2 x 5.2 cm right hepatic lobe mass and T4 lytic lesion with possible soft tissue invasion into left neural foramina.  He was a started on heparin drip.  EDP discussed with neurosurgery, Dr. Ardis Hughs who agrees with anticoagulation, medical management, MRI and biopsy but no need for acute NSU intervention.  MRI C/T/L-spine with pathologic split type fracture of T4 with approximately 25% height loss, moderate left L5-S1 neural foraminal stenosis and bilateral lateral recess narrowing at L4-5.   IR consulted the next day and recommended CT abdomen and pelvis which showed previously noted liver lesion obliterating bile duct in central right liver resulting in right hepatic lobe biliary dilation, known T4 lesion and prostamegaly.  Underwent ultrasound-guided liver biopsy on 8/5.  MRI brain negative.  Oncology requested port a cath placement before discharge.  IR to place on 8/9.  Subjective: Seen and examined earlier this morning with the help of video interpreter with ID number [id number].  No major events overnight of this morning.  Still with some back pain and left-sided chest wall pain.  He rates his pain 5/10.  No other complaints.  Objective: Vitals:   05/17/21 2107 05/18/21 0508 05/18/21 1231 05/18/21 2011  BP: (!) 146/86 128/78 132/77 136/73  Pulse: (!) 57 (!) 51 (!) 53 70  Resp: '19 18 16 18  '$ Temp: (!) 97.4 F (36.3 C) (!) 97.4 F (36.3 C)  97.7 F (36.5 C)  TempSrc: Oral Oral Oral Oral  SpO2: 96% 98% 98% 98%  Weight:      Height:        Intake/Output  Summary (Last 24 hours) at 05/18/2021 2124 Last data filed at 05/18/2021 1800 Gross per 24 hour  Intake 277.55 ml  Output 0 ml  Net 277.55 ml   Filed Weights   05/13/21 1150  Weight: 61.2 kg    Examination:  GENERAL: No apparent distress.  Nontoxic. HEENT: MMM.  Vision and hearing grossly intact.  NECK: Supple.  No apparent JVD.  RESP: On RA.  No IWOB.  Fair aeration bilaterally. CVS:  RRR. Heart sounds normal.  ABD/GI/GU: BS+. Abd soft, NTND.  MSK/EXT:  Moves extremities. No apparent deformity. No edema.  SKIN: no apparent skin lesion or wound NEURO: Awake and alert. Oriented appropriately.  No apparent focal neuro deficit. PSYCH: Calm. Normal affect.   Procedures:  8/5-ultrasound-guided liver biopsy by IR  Microbiology summarized: COVID-19 and influenza PCR nonreactive.  Assessment & Plan: Acute RLL subsegmental PE-initial CTA chest was nonconclusive.  Repeat CTA chest confirmed this.  Does not seem to be symptomatic from this.  His chest pain is on the left and seems to be musculoskeletal.  He is back pain likely due to T4 region.  LE [place] negative for DVT.  Hemodynamically stable.  Although his PE seems to be small, he is at high risk for VTE due to malignancy. -Remains on IV heparin pending port a cath placement. -P.o. Eliquis or Xarelto on discharge.  Right hepatic lobe mass-6.2 x 5.2 cm right hepatic lobe mass noted on CT chest.  Never  had colonoscopy. T4 pathologic fracture with 25% height loss-could be the cause of his left-sided chest pain -CEA elevated.  AFP on the upper limit of normal. -NS, Dr. Ardis Hughs consulted on admission and did not feel there is any need for acute NS intervention -S/p US guided liver biopsy by IR on 8/5 -MRI brain negative. -Appreciate oncology recommendations  -Port a cath placement-planned for 8/9.  -Dexamethasone 4 mg every 8 hours -Continue as needed Tylenol and oxycodone  Right hepatic lobe biliary dilation-due to bile duct obliteration  by liver mass Elevated liver enzymes/alkaline phosphatase: Likely due to the above.  Stable.  GGT 687. -Continue monitoring  Elevated BP: No history of hypertension.  Normotensive.  -As needed hydralazine  Tobacco use disorder: Current smoker. -Encourage cessation -Nicotine patch  Language barrier affecting communication -Patient's son assisted with interpretation per patient's choice.  Body mass index is 24.69 kg/m.         DVT prophylaxis:    On IV heparin.  Code Status: Full code Family Communication: Patient and/or RN.  Updated patient's son at bedside. Level of care: Telemetry Status is: Inpatient  Remains inpatient appropriate because:Ongoing diagnostic testing needed not appropriate for outpatient work up and Inpatient level of care appropriate due to severity of illness  Dispo: The patient is from: Home              Anticipated d/c is to: Home              Patient currently is not medically stable to d/c.   Difficult to place patient No       Consultants:  Neurosurgery over the phone by EDP Interventional radiology Oncology   Sch Meds:  Scheduled Meds:  dexamethasone  4 mg Oral Q8H   nicotine  14 mg Transdermal Daily   senna-docusate  1 tablet Oral BID   Continuous Infusions:  heparin 1,150 Units/hr (05/18/21 1701)    PRN Meds:.  Antimicrobials: Anti-infectives (From admission, onward)    None        I have personally reviewed the following labs and images: CBC: Recent Labs  Lab 05/13/21 1128 05/14/21 0528 05/15/21 0513 05/16/21 0240 05/18/21 0438  WBC 6.4 7.2 6.6 6.9 18.3*  HGB 15.8 16.0 15.6 14.8 15.7  HCT 46.3 47.6 47.4 44.9 47.8  MCV 91.9 95.8 95.6 95.1 95.2  PLT 223 208 217 220 250   BMP &GFR Recent Labs  Lab 05/13/21 1128 05/14/21 0528 05/14/21 1628 05/16/21 0240 05/17/21 0532  NA 142 139 139 138 136  K 4.2 3.7 3.8 3.8 4.0  CL 107 104 100 104 105  CO2 25 29 32 25 25  GLUCOSE 90 131* 74 138* 126*  BUN '14 16 17  20 15  '$ CREATININE 0.66 0.76 0.87 0.75 0.56*  CALCIUM 9.2 8.7* 8.8* 8.4* 8.9  MG  --   --  2.5* 2.4  --   PHOS  --   --  3.5 3.2  --    Estimated Creatinine Clearance: 70.1 mL/min (A) (by C-G formula based on SCr of 0.56 mg/dL (L)). Liver & Pancreas: Recent Labs  Lab 05/13/21 1500 05/13/21 2327 05/14/21 1628 05/16/21 0240 05/17/21 0532  AST 61* 57* 52* 52* 57*  ALT 85* 83* 75* 72* 83*  ALKPHOS 578* 521* 487* 448* 469*  BILITOT 0.9 0.5 0.6 0.6 0.6  PROT 7.1 7.2 6.5 6.2* 6.9  ALBUMIN 4.1 3.9 3.5 3.3* 3.7   No results for input(s): LIPASE, AMYLASE in the last 168 hours. No  results for input(s): AMMONIA in the last 168 hours. Diabetic: No results for input(s): HGBA1C in the last 72 hours. No results for input(s): GLUCAP in the last 168 hours. Cardiac Enzymes: No results for input(s): CKTOTAL, CKMB, CKMBINDEX, TROPONINI in the last 168 hours. No results for input(s): PROBNP in the last 8760 hours. Coagulation Profile: Recent Labs  Lab 05/15/21 0833  INR 0.9   Thyroid Function Tests: No results for input(s): TSH, T4TOTAL, FREET4, T3FREE, THYROIDAB in the last 72 hours. Lipid Profile: No results for input(s): CHOL, HDL, LDLCALC, TRIG, CHOLHDL, LDLDIRECT in the last 72 hours. Anemia Panel: No results for input(s): VITAMINB12, FOLATE, FERRITIN, TIBC, IRON, RETICCTPCT in the last 72 hours. Urine analysis:    Component Value Date/Time   COLORURINE YELLOW 08/22/2018 1125   APPEARANCEUR CLEAR 08/22/2018 1125   LABSPEC 1.003 (L) 08/22/2018 1125   PHURINE 6.0 08/22/2018 1125   GLUCOSEU NEGATIVE 08/22/2018 1125   HGBUR LARGE (A) 08/22/2018 1125   BILIRUBINUR NEGATIVE 08/22/2018 1125   KETONESUR NEGATIVE 08/22/2018 1125   PROTEINUR NEGATIVE 08/22/2018 1125   NITRITE NEGATIVE 08/22/2018 1125   LEUKOCYTESUR NEGATIVE 08/22/2018 1125   Sepsis Labs: Invalid input(s): PROCALCITONIN, [place]  Microbiology: Recent Results (from the past 240 hour(s))  Resp Panel by RT-PCR (Flu  A&B, Covid) Nasopharyngeal Swab     Status: None   Collection Time: 05/13/21  3:40 PM   Specimen: Nasopharyngeal Swab; Nasopharyngeal(NP) swabs in vial transport medium  Result Value Ref Range Status   SARS Coronavirus 2 by RT PCR NEGATIVE NEGATIVE Final    Comment: (NOTE) SARS-CoV-2 target nucleic acids are NOT DETECTED.  The SARS-CoV-2 RNA is generally detectable in upper respiratory specimens during the acute phase of infection. The lowest concentration of SARS-CoV-2 viral copies this assay can detect is 138 copies/mL. A negative result does not preclude SARS-Cov-2 infection and should not be used as the sole basis for treatment or other patient management decisions. A negative result may occur with  improper specimen collection/handling, submission of specimen other than nasopharyngeal swab, presence of viral mutation(s) within the areas targeted by this assay, and inadequate number of viral copies(<138 copies/mL). A negative result must be combined with clinical observations, patient history, and epidemiological information. The expected result is Negative.  Fact Sheet for Patients:  EntrepreneurPulse.com.au  Fact Sheet for Healthcare Providers:  IncredibleEmployment.be  This test is no t yet approved or cleared by the [place] FDA and  has been authorized for detection and/or diagnosis of SARS-CoV-2 by FDA under an Emergency Use Authorization (EUA). This EUA will remain  in effect (meaning this test can be used) for the duration of the COVID-19 declaration under Section 564(b)(1) of the Act, 21 U.S.C.section 360bbb-3(b)(1), unless the authorization is terminated  or revoked sooner.       Influenza A by PCR NEGATIVE NEGATIVE Final   Influenza B by PCR NEGATIVE NEGATIVE Final    Comment: (NOTE) The Xpert Xpress SARS-CoV-2/FLU/RSV plus assay is intended as an aid in the diagnosis of influenza from Nasopharyngeal swab specimens  and should not be used as a sole basis for treatment. Nasal washings and aspirates are unacceptable for Xpert Xpress SARS-CoV-2/FLU/RSV testing.  Fact Sheet for Patients: EntrepreneurPulse.com.au  Fact Sheet for Healthcare Providers: IncredibleEmployment.be  This test is not yet approved or cleared by the [place] FDA and has been authorized for detection and/or diagnosis of SARS-CoV-2 by FDA under an Emergency Use Authorization (EUA). This EUA will remain in effect (meaning this test can be used)  for the duration of the COVID-19 declaration under Section 564(b)(1) of the Act, 21 U.S.C. section 360bbb-3(b)(1), unless the authorization is terminated or revoked.  Performed at [facility], [address] [place], [place] [address]     Radiology Studies: No results found.     Najai Waszak T. [place]  If 7PM-7AM, please contact night-coverage [email or web address] 05/18/2021, 9:24 PM

## 2021-05-18 NOTE — Progress Notes (Signed)
Brief Oncology Note:  Jason Lyons is scheduled for a new patient appointment at the cancer center on Thursday 05/21/21 at 2:00 pm. He should arrive 30 minutes early to register. Following his appointment with Dr. Alvy Bimler, he is scheduled for chemotherapy education at 4:00 pm.  Appointments communicated to hospitalist.  He is awaiting Port-A-Cath placement by IR.

## 2021-05-18 NOTE — Progress Notes (Signed)
Pt has been NPO since midnight for possible port -a- cath placement. Son at bedside. No concerns voiced.

## 2021-05-19 ENCOUNTER — Inpatient Hospital Stay (HOSPITAL_COMMUNITY): Payer: Managed Care, Other (non HMO)

## 2021-05-19 HISTORY — PX: IR IMAGING GUIDED PORT INSERTION: IMG5740

## 2021-05-19 LAB — CBC
HCT: 45.8 % (ref 39.0–52.0)
Hemoglobin: 15.3 g/dL (ref 13.0–17.0)
MCH: 31.5 pg (ref 26.0–34.0)
MCHC: 33.4 g/dL (ref 30.0–36.0)
MCV: 94.4 fL (ref 80.0–100.0)
Platelets: 245 10*3/uL (ref 150–400)
RBC: 4.85 MIL/uL (ref 4.22–5.81)
RDW: 15.7 % — ABNORMAL HIGH (ref 11.5–15.5)
WBC: 17.2 10*3/uL — ABNORMAL HIGH (ref 4.0–10.5)
nRBC: 0 % (ref 0.0–0.2)

## 2021-05-19 LAB — HEPARIN LEVEL (UNFRACTIONATED): Heparin Unfractionated: 0.36 IU/mL (ref 0.30–0.70)

## 2021-05-19 MED ORDER — MIDAZOLAM HCL 2 MG/2ML IJ SOLN
INTRAMUSCULAR | Status: AC
  Filled 2021-05-19: qty 4

## 2021-05-19 MED ORDER — APIXABAN 5 MG PO TABS: ORAL_TABLET | ORAL | 1 refills | Status: DC

## 2021-05-19 MED ORDER — LIDOCAINE-EPINEPHRINE 1 %-1:100000 IJ SOLN
INTRAMUSCULAR | Status: AC
  Filled 2021-05-19: qty 1

## 2021-05-19 MED ORDER — HEPARIN SOD (PORK) LOCK FLUSH 100 UNIT/ML IV SOLN
500.0000 [IU] | INTRAVENOUS | Status: AC | PRN
  Administered 2021-05-19: 500 [IU]
  Filled 2021-05-19: qty 5

## 2021-05-19 MED ORDER — APIXABAN 5 MG PO TABS
5.0000 mg | ORAL_TABLET | Freq: Two times a day (BID) | ORAL | Status: DC
  Administered 2021-05-19: 5 mg via ORAL
  Filled 2021-05-19: qty 1

## 2021-05-19 MED ORDER — FENTANYL CITRATE (PF) 100 MCG/2ML IJ SOLN
INTRAMUSCULAR | Status: AC | PRN
  Administered 2021-05-19 (×2): 50 ug via INTRAVENOUS

## 2021-05-19 MED ORDER — MIDAZOLAM HCL 2 MG/2ML IJ SOLN
INTRAMUSCULAR | Status: AC | PRN
  Administered 2021-05-19 (×2): 1 mg via INTRAVENOUS

## 2021-05-19 MED ORDER — FENTANYL CITRATE (PF) 100 MCG/2ML IJ SOLN
INTRAMUSCULAR | Status: AC
  Filled 2021-05-19: qty 2

## 2021-05-19 MED ORDER — LIDOCAINE-EPINEPHRINE 1 %-1:100000 IJ SOLN
INTRAMUSCULAR | Status: AC | PRN
  Administered 2021-05-19: 20 mL

## 2021-05-19 MED ORDER — APIXABAN 5 MG PO TABS: 5.0000 mg | ORAL_TABLET | Freq: Two times a day (BID) | ORAL | 1 refills | Status: DC

## 2021-05-19 NOTE — Procedures (Signed)
Interventional Radiology Procedure Note  Procedure: RT IJ POWER PORT    Complications: None  Estimated Blood Loss:  MIN  Findings: TIP SVCRA    M. TREVOR Kidada Ging, MD    

## 2021-05-19 NOTE — Progress Notes (Signed)
ANTICOAGULATION CONSULT NOTE  Pharmacy Consult for heparin Indication: acute pulmonary embolus  No Known Allergies  Patient Measurements: Height: '5\' 2"'$  (157.5 cm) Weight: 61.2 kg (135 lb) IBW/kg (Calculated) : 54.6 Heparin Dosing Weight: 61 kg  Vital Signs: Temp: 97.7 F (36.5 C) (08/09 0504) Temp Source: Oral (08/09 0504) BP: 122/78 (08/09 0504) Pulse Rate: 67 (08/09 0504)  Labs: Recent Labs    05/17/21 0532 05/17/21 2014 05/18/21 0438 05/18/21 2312 05/19/21 0446  HGB  --   --  15.7  --  15.3  HCT  --   --  47.8  --  45.8  PLT  --   --  250  --  245  HEPARINUNFRC  --    < > 0.64 0.35 0.36  CREATININE 0.56*  --   --   --   --    < > = values in this interval not displayed.    Estimated Creatinine Clearance: 70.1 mL/min (A) (by C-G formula based on SCr of 0.56 mg/dL (L)).  Assessment: Patient is a 65 y.o M presented to the ED on 8/3 with c/o back and chest pain.  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  Chest CT on 8/3 showed findings with concern for subsegmental PE in the right lower lobe, but not confirmed due to respiratory motion artifact. LE doppler on 8/4 was negativefor DVT. Repeat chect CT on 8/6 resulted back "positive for subsegmental sized emboli in the right lower lobe." He has been treted with heparin and Lovenox since 8/3. Pharmacy has been reconsulted to transition back to heparin infusion.  Today, 05/19/2021: - Heparin level continues to be therapeutic on current IV heparin rate of 1150 units/hr - IR to place port 8/9 - CBC stable WNL - No bleeding documented  Goal of Therapy:  Heparin level 0.3-0.7 units/ml Monitor platelets by anticoagulation protocol: Yes   Plan:  Continue heparin drip at 1150 units/hr, unsure when port to be placed today and plan for holding/continuing heparin Monitor daily heparin level, CBC, s/s bleeding F/U long-term anticoagulation plan s/p port placement   Adrian Saran, PharmD,  BCPS Secure Chat if ?s 05/19/2021 8:27 AM

## 2021-05-19 NOTE — Discharge Summary (Signed)
Physician Discharge Summary  Jason Lyons [id number] DOB: 03/11/1956 DOA: 05/13/2021  PCP: Pcp, No  Admit date: 05/13/2021 Discharge date: 05/19/2021  Admitted From: Home Disposition: Home  Recommendations for Outpatient Follow-up:  Follow ups as below. Please obtain CBC/CMP/Mag at follow up Please follow up on the following pending results: Liver biopsy results  Home Health: Not indicated Equipment/Devices: Not indicated  Discharge Condition: Stable CODE STATUS: Full code  Hospital Course: 65 year old [place] speaking male with history of tobacco abuse presenting with left-sided chest pain and mid back pain for about a months, and admitted with working diagnosis of RLL subsegmental PE, 6.2 x 5.2 cm right hepatic lobe mass and T4 lytic lesion with possible soft tissue invasion into left neural foramina.  He was a started on heparin drip.  EDP discussed with neurosurgery, Dr. Ardis Hughs who agrees with anticoagulation, medical management, MRI and biopsy but no need for acute NSU intervention.  MRI C/T/L-spine with pathologic split type fracture of T4 with approximately 25% height loss, moderate left L5-S1 neural foraminal stenosis and bilateral lateral recess narrowing at L4-5.   IR consulted the next day and recommended CT abdomen and pelvis which showed previously noted liver lesion obliterating bile duct in central right liver resulting in right hepatic lobe biliary dilation, known T4 lesion and prostamegaly.  Underwent ultrasound-guided liver biopsy on 8/5.  MRI brain negative.  Port a cath placed on 8/9 prior to discharge.  Patient was transitioned to Eliquis and discharged home on Eliquis.  Prior authorization on Eliquis completed through [place].  Pharmacy notified but they don't have his [employer] card.  Patient has been advised to present his [employer] prescription card to the pharmacy.  Patient to follow-up with oncology on 8/11.  See individual problem list below for more hospital  course.  Discharge Diagnoses:  Acute RLL subsegmental PE-initial CTA chest was nonconclusive.  Repeat CTA chest confirmed this.  Does not seem to be symptomatic from this.  His chest pain is on the left and seems to be musculoskeletal.  He is back pain likely due to T4 region.  LE [place] negative for DVT.  Hemodynamically stable.  Although his PE seems to be small, he is at high risk for VTE due to malignancy. -Received IV heparin for 6 days.  -Discharged on Eliquis 5 mg twice daily.    Right hepatic lobe mass-6.2 x 5.2 cm right hepatic lobe mass noted on CT chest.  Never had colonoscopy. T4 pathologic fracture with 25% height loss-could be the cause of his left-sided chest pain -CEA elevated.  AFP on the upper limit of normal. -NS, Dr. Ardis Hughs consulted on admission and did not feel there is any need for acute NS intervention -S/p US guided liver biopsy by IR on 8/5.  Resolved pending. -MRI brain negative. -Port a cath placed on 05/19/2021. -Discharged on p.o. Decadron, oxycodone and bowel regimen -Outpatient follow-up with oncology on 8/11.   Right hepatic lobe biliary dilation-due to bile duct obliteration by liver mass Elevated liver enzymes/alkaline phosphatase: Likely due to the above.  Stable.  GGT 687. -Recheck CMP at follow-up   Elevated BP: No history of hypertension.  Normotensive.   Tobacco use disorder: Current smoker. -Encourage cessation, and advised to use nicotine patch   Language barrier affecting communication -Video interpreter utilized  Body mass index is 24.69 kg/m.            Discharge Exam: Vitals:   05/19/21 1230 05/19/21 1251  BP: 116/70 125/79  Pulse: (!) 54 (!)  56  Resp: 18 17  Temp:  97.9 F (36.6 C)  SpO2: 99% 99%    GENERAL: No apparent distress.  Nontoxic. HEENT: MMM.  Vision and hearing grossly intact.  NECK: Supple.  No apparent JVD.  RESP: On RA.  No IWOB.  Fair aeration bilaterally. CVS:  RRR. Heart sounds normal.  ABD/GI/GU: Bowel  sounds present. Soft. Non tender.  MSK/EXT:  Moves extremities. No apparent deformity. No edema.  SKIN: no apparent skin lesion or wound NEURO: Awake, alert and oriented appropriately.  No apparent focal neuro deficit. PSYCH: Calm. Normal affect.   Discharge Instructions  Discharge Instructions     Diet general   Complete by: As directed    Discharge instructions   Complete by: As directed    It has been a pleasure taking care of you!  You were hospitalized during care blood clot in your right lung and liver mass concerning for cancer that might have spread to one of your back bone as well.  We are still waiting on the results of the liver biopsy.  Interventional radiologist will arrange outpatient follow-up for chemotherapy port placement.  Please go to your appointment with oncologist on 8/11.  We are discharging you on blood thinner for the blood clot.  Start taking this medicine tomorrow morning.  Please avoid any over-the-counter pain medications except for plain Tylenol while taking blood thinner.  Watch for signs of bleeding such as dark tarry stool or bright red blood in stool.  It is important that you quit smoking cigarettes.  You may use nicotine patch to help you quit smoking.  Nicotine patch is available over-the-counter.  You may also discuss other options to help you quit smoking with your primary care doctor. You can also talk to professional counselors at 1-800-QUIT-NOW [phone]) for free smoking cessation counseling.     Take care,   Increase activity slowly   Complete by: As directed    No wound care   Complete by: As directed       Allergies as of 05/19/2021   No Known Allergies      Medication List     STOP taking these medications    ibuprofen 600 MG tablet Commonly known as: ADVIL   oxyCODONE-acetaminophen 5-325 MG tablet Commonly known as: PERCOCET/ROXICET       TAKE these medications    apixaban 5 MG Tabs tablet Commonly known as:  ELIQUIS Take 1 tablet (5 mg total) by mouth 2 (two) times daily.   dexamethasone 4 MG tablet Commonly known as: DECADRON Take 1 tablet (4 mg total) by mouth every 8 (eight) hours for 7 days.   nicotine 21 mg/24hr patch Commonly known as: Nicoderm CQ Place 1 patch (21 mg total) onto the skin daily.   ondansetron 4 MG disintegrating tablet Commonly known as: Zofran ODT Take 1 tablet (4 mg total) by mouth every 8 (eight) hours as needed for nausea or vomiting.   oxyCODONE 5 MG immediate release tablet Commonly known as: Oxy IR/ROXICODONE Take 1 tablet (5 mg total) by mouth every 6 (six) hours as needed for up to 5 days for severe pain or moderate pain.   senna-docusate 8.6-50 MG tablet Commonly known as: Senokot-S Take 1 tablet by mouth 2 (two) times daily as needed for mild constipation or moderate constipation.        Consultations: Interventional radiology Oncology  Procedures/Studies: 8/5-image guided liver biopsy 8/9-port a cath placement   CT ABDOMEN PELVIS W WO CONTRAST  Result  Date: 05/15/2021 CLINICAL DATA:  Masslike area identified in the liver on recent CTA Chest. EXAM: CT ABDOMEN AND PELVIS WITHOUT AND WITH CONTRAST TECHNIQUE: Multidetector CT imaging of the abdomen and pelvis was performed following the standard protocol before and following the bolus administration of intravenous contrast. CONTRAST:  182m OMNIPAQUE IOHEXOL 350 MG/ML SOLN COMPARISON:  CTA Chest 05/13/2021 FINDINGS: Lower chest: Dependent atelectasis in both lower lobes. Hepatobiliary: 5.0 x 3.3 x 4.5 cm ill-defined irregular low-density lesion is identified in the anterior right liver inferiorly. Arterial phase imaging shows subtle irregular peripheral enhancement with heterogeneous enhancement in the lesion inferiorly. Lesion takes on a more infiltrative appearance at the inferior tip of the liver extending more posteriorly. The mass tracks along the gallbladder fossa towards the fundus in the  gallbladder is contracted and irregular in appearance at this location. Focal biliary dilatation is seen in the anterior right liver (segment VIII) into a lesser degree in the posterior right liver (segments VI and VII). There is abrupt cut off of the dilated bile ducts along the medial aspect of the right hepatic lobe lesion. Similarly, portal venous anatomy appears obliterated centrally in the anterior right liver along the superomedial margin of the lesion. As above, gallbladder appears irregular with diffuse wall thickening. No extrahepatic biliary duct dilatation. Pancreas: No focal mass lesion. No dilatation of the main duct. No intraparenchymal cyst. No peripancreatic edema. Spleen: No splenomegaly. No focal mass lesion. Adrenals/Urinary Tract: No adrenal nodule or mass. Kidneys unremarkable. No evidence for hydroureter. The urinary bladder appears normal for the degree of distention. Stomach/Bowel: Stomach is unremarkable. No gastric wall thickening. No evidence of outlet obstruction. Duodenum is normally positioned as is the ligament of Treitz. No small bowel wall thickening. No small bowel dilatation. The terminal ileum is normal. The appendix is normal. No gross colonic mass. No colonic wall thickening. Vascular/Lymphatic: Insert calcium aorta upper normal lymph nodes are seen in the hepatoduodenal ligament. No retroperitoneal lymphadenopathy No pelvic sidewall lymphadenopathy. Reproductive: Prostate gland is enlarged with heterogeneous enhancement and dystrophic calcification. Other: Trace free fluid is noted in the pelvis. Musculoskeletal: No worrisome lytic or sclerotic osseous abnormality. IMPRESSION: 1. 5 cm ill-defined irregular low-density lesion in the anterior right liver inferiorly is highly suspicious for primary or metastatic neoplasm. The lesion obliterates bile ducts in the central right liver resulting in right hepatic lobe biliary dilatation. Similarly, portal venous anatomy in the  anterior right liver is involved along the superomedial margin of the lesion. Lesion should be amenable to tissue sampling. 2. Gallbladder wall is contracted and irregular in appearance adjacent to the lesion. Imaging features suggest that gallbladder changes are secondary although primary gallbladder neoplasm not excluded. 3. 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 No evidence for additional metastatic disease in the abdomen or pelvis. 4. Prostatomegaly. 5. Trace free fluid in the pelvis. 6. Aortic Atherosclerosis (ICD10-I70.0). Electronically Signed   By: EMisty StanleyM.D.   On: 05/15/2021 05:53   DG Chest 2 View  Result Date: 05/13/2021 CLINICAL DATA:  Chest pain. EXAM: CHEST - 2 VIEW COMPARISON:  No prior. FINDINGS: Mediastinum and hilar structures normal. Low lung volumes with mild bibasilar atelectasis. No pleural effusion or pneumothorax. Heart size normal. Mild thoracic spine scoliosis. No acute bony abnormality. IMPRESSION: Low lung volumes with mild bibasilar atelectasis. Electronically Signed   By: TMarcello Moores Register   On: 05/13/2021 12:04   CT Angio Chest Pulmonary Embolism (PE) W or WO Contrast  Result Date: 05/16/2021 CLINICAL DATA:  65year old  male with clinical concern for pulmonary embolism. Follow-up study. EXAM: CT ANGIOGRAPHY CHEST WITH CONTRAST TECHNIQUE: Multidetector CT imaging of the chest was performed using the standard protocol during bolus administration of intravenous contrast. Multiplanar CT image reconstructions and MIPs were obtained to evaluate the vascular anatomy. CONTRAST:  12m OMNIPAQUE IOHEXOL 350 MG/ML SOLN COMPARISON:  Chest CTA 05/13/2021. FINDINGS: Cardiovascular: Again noted are small subsegmental sized filling defects in pulmonary artery branches in the right lower lobe best appreciated on axial images 169-178 of series 5. No other central, lobar or segmental sized filling defects are noted elsewhere in the pulmonary arterial tree. Heart size is  normal. There is no significant pericardial fluid, thickening or pericardial calcification. There is aortic atherosclerosis, as well as atherosclerosis of the great vessels of the mediastinum and the coronary arteries, including calcified atherosclerotic plaque in the left main, left anterior descending and left circumflex coronary arteries. Ectasia of ascending thoracic aorta (4.0 cm in diameter). Mediastinum/Nodes: No pathologically enlarged mediastinal or hilar lymph nodes. Esophagus is unremarkable in appearance. No axillary lymphadenopathy. Lungs/Pleura: Dependent areas of subsegmental atelectasis are noted in the lower lobes of the lungs bilaterally. No acute consolidative airspace disease. No pleural effusions. 3 mm pulmonary nodule in the apex of the left upper lobe (axial image 28 of series 6). No other larger more suspicious appearing pulmonary nodules or masses are noted. Upper Abdomen: Please see dictation for CT of the abdomen and pelvis 05/14/2021 for full description of findings below the diaphragm, which includes a large liver mass. Musculoskeletal: Lytic lesion in L4 with pathologic fracture resulting in 25% loss of posterior vertebral body height and 4 mm of retropulsion of fracture fragments impinging upon the central spinal canal. Review of the MIP images confirms the above findings. IMPRESSION: 1. Study is once again positive for subsegmental sized emboli in the right lower lobe. No larger central, lobar or segmental sized emboli are noted. 2. Lytic lesion in T4 with pathologic fracture, similar to the prior study. This was better depicted on recent thoracic spine MRI. 3. Aortic atherosclerosis, in addition to left main and 3 vessel coronary artery disease. There is also mild ectasia of the ascending thoracic aorta (4.0 cm in diameter). Recommend annual imaging followup by CTA or MRA. This recommendation follows 2010 ACCF/AHA/AATS/ACR/ASA/SCA/SCAI/SIR/STS/SVM Guidelines for the Diagnosis and  Management of Patients with Thoracic Aortic Disease. Circulation. 2010; [id number] Aortic aneurysm NOS (ICD10-I71.9). Aortic Atherosclerosis (ICD10-I70.0). Electronically Signed   By: DVinnie LangtonM.D.   On: 05/16/2021 10:24   CT Angio Chest PE W and/or Wo Contrast  Addendum Date: 05/15/2021   ADDENDUM REPORT: 05/15/2021 10:12 ADDENDUM: Case was reviewed with multiple radiologists, subsegmental PE in the right lower lobe is possible, however conclusive evaluation is limited due to respiratory motion artifact. If the presence or absence of subsegmental pulmonary embolus would change management, repeat chest CTA could be considered. Electronically Signed   By: LYetta GlassmanMD   On: 05/15/2021 10:12   Result Date: 05/15/2021 CLINICAL DATA:  Shortness of breath EXAM: CT ANGIOGRAPHY CHEST WITH CONTRAST TECHNIQUE: Multidetector CT imaging of the chest was performed using the standard protocol during bolus administration of intravenous contrast. Multiplanar CT image reconstructions and MIPs were obtained to evaluate the vascular anatomy. CONTRAST:  671mOMNIPAQUE IOHEXOL 350 MG/ML SOLN COMPARISON:  None. FINDINGS: Cardiovascular: Adequate contrast opacification of the pulmonary arteries. Subsegmental pulmonary embolus seen in the right lower lobe pulmonary arteries. There is normal heart size. No pericardial effusion. Ascending thoracic aorta is upper  limits of normal in size measuring 3.9 cm atherosclerotic disease of the thoracic aorta. Mediastinum/Nodes: No enlarged mediastinal, hilar, or axillary lymph nodes. Thyroid gland, trachea, and esophagus demonstrate no significant findings. Lungs/Pleura: Lungs are clear. No pleural effusion or pneumothorax. No Upper Abdomen: Ill-defined masslike area seen in the right lobe of the liver measuring approximately 6.2 x 5.2 cm with adjacent areas of linear low density. Musculoskeletal: Lytic lesion of the T4 vertebral body with possible soft tissue extension into  the left neural foramen. No evidence of retropulsion Review of the MIP images confirms the above findings. IMPRESSION: Positive for subsegmental pulmonary embolus in the right lower lobe. Ill-defined masslike area seen in the right lobe of the liver with adjacent intrahepatic biliary ductal dilation. 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 Possible soft tissue extension into the left neural foramen. Finding could be further evaluated with contrast enhanced MRI of the spine. Critical Value/emergent results were called by telephone at the time of interpretation on 05/13/2021 at 2:00 pm to provider [facility] , who verbally acknowledged these results. Electronically Signed: By: Yetta Glassman MD On: 05/13/2021 14:01   MR BRAIN W WO CONTRAST  Result Date: 05/16/2021 CLINICAL DATA:  Cancer of unknown primary, staging metastatic cancer. EXAM: MRI HEAD WITHOUT AND WITH CONTRAST TECHNIQUE: Multiplanar, multiecho pulse sequences of the brain and surrounding structures were obtained without and with intravenous contrast. CONTRAST:  63m GADAVIST GADOBUTROL 1 MMOL/ML IV SOLN COMPARISON:  None. FINDINGS: Brain: There is no evidence of an acute infarct, intracranial hemorrhage, mass, midline shift, or extra-axial fluid collection. Mild cerebral atrophy is within normal limits for age. No significant white matter disease is seen. No abnormal enhancement is identified. Vascular: Major intracranial vascular flow voids are preserved. Skull and upper cervical spine: Unremarkable bone marrow signal. Sinuses/Orbits: Unremarkable orbits. Paranasal sinuses and mastoid air cells are clear. Other: None. IMPRESSION: Unremarkable appearance of the brain for age. No evidence of intracranial metastases. Electronically Signed   By: ALogan BoresM.D.   On: 05/16/2021 17:38   MR CERVICAL SPINE W WO CONTRAST  Result Date: 05/14/2021 CLINICAL DATA:  Metastatic disease evaluation EXAM: MRI  CERVICAL, THORACIC AND LUMBAR SPINE WITHOUT AND WITH CONTRAST TECHNIQUE: Multiplanar and multiecho pulse sequences of the cervical spine, to include the craniocervical junction and cervicothoracic junction, and thoracic and lumbar spine, were obtained without and with intravenous contrast. CONTRAST:  664mGADAVIST GADOBUTROL 1 MMOL/ML IV SOLN COMPARISON:  None. FINDINGS: MRI CERVICAL SPINE FINDINGS Alignment: Physiologic. Vertebrae: No fracture, evidence of discitis, or bone lesion. Cord: Normal signal and morphology. Posterior Fossa, vertebral arteries, paraspinal tissues: Negative. Disc levels: C1-2: Unremarkable C2-3: Small central disc protrusion. There is no spinal canal stenosis. No neural foraminal stenosis. C3-4: Normal disc space and facet joints. There is no spinal canal stenosis. No neural foraminal stenosis. C4-5: Normal disc space and facet joints. There is no spinal canal stenosis. No neural foraminal stenosis. C5-6: Small central disc protrusion. There is no spinal canal stenosis. No neural foraminal stenosis. C6-7: Normal disc space and facet joints. There is no spinal canal stenosis. No neural foraminal stenosis. C7-T1: Normal disc space and facet joints. There is no spinal canal stenosis. No neural foraminal stenosis. MRI THORACIC SPINE FINDINGS Alignment:  Physiologic. Vertebrae: Diffusely abnormal bone marrow signal at T4 with split type fracture and expansion of the left pedicle. There is diffuse abnormal contrast enhancement. Approximately 25% height loss. Cord:  Normal signal and morphology. Paraspinal and other soft  tissues: Negative. Disc levels: MRI LUMBAR SPINE FINDINGS Segmentation:  Standard. Alignment:  Physiologic. Vertebrae:  Hemangioma at L1.  No other osseous lesion. Conus medullaris and cauda equina: Conus extends to the L1 level. Conus and cauda equina appear normal. Paraspinal and other soft tissues: Negative Disc levels: L1-L2: Normal disc space and facet joints. No spinal canal  stenosis. No neural foraminal stenosis. L2-L3: Normal disc space and facet joints. No spinal canal stenosis. No neural foraminal stenosis. L3-L4: Small disc bulge. No spinal canal stenosis. No neural foraminal stenosis. L4-L5: Small disc bulge with superimposed central protrusion. Left-greater-than-right lateral recess narrowing without central spinal canal stenosis. No neural foraminal stenosis. L5-S1: Left asymmetric disc bulge. Left lateral recess narrowing without central spinal canal stenosis. Moderate left neural foraminal stenosis. Visualized sacrum: Normal. IMPRESSION: 1. Pathologic split type fracture of T4 with approximately 25% height loss, likely due to metastatic disease. 2. No other evidence of metastatic disease of the cervical, thoracic or lumbar spine. 3. Moderate left L5-S1 neural foraminal stenosis. 4. Left-greater-than-right lateral recess narrowing at L4-5 and L5-S1. Electronically Signed   By: Ulyses Jarred M.D.   On: 05/14/2021 19:32   MR THORACIC SPINE W WO CONTRAST  Result Date: 05/14/2021 CLINICAL DATA:  Metastatic disease evaluation EXAM: MRI CERVICAL, THORACIC AND LUMBAR SPINE WITHOUT AND WITH CONTRAST TECHNIQUE: Multiplanar and multiecho pulse sequences of the cervical spine, to include the craniocervical junction and cervicothoracic junction, and thoracic and lumbar spine, were obtained without and with intravenous contrast. CONTRAST:  48m GADAVIST GADOBUTROL 1 MMOL/ML IV SOLN COMPARISON:  None. FINDINGS: MRI CERVICAL SPINE FINDINGS Alignment: Physiologic. Vertebrae: No fracture, evidence of discitis, or bone lesion. Cord: Normal signal and morphology. Posterior Fossa, vertebral arteries, paraspinal tissues: Negative. Disc levels: C1-2: Unremarkable C2-3: Small central disc protrusion. There is no spinal canal stenosis. No neural foraminal stenosis. C3-4: Normal disc space and facet joints. There is no spinal canal stenosis. No neural foraminal stenosis. C4-5: Normal disc space and  facet joints. There is no spinal canal stenosis. No neural foraminal stenosis. C5-6: Small central disc protrusion. There is no spinal canal stenosis. No neural foraminal stenosis. C6-7: Normal disc space and facet joints. There is no spinal canal stenosis. No neural foraminal stenosis. C7-T1: Normal disc space and facet joints. There is no spinal canal stenosis. No neural foraminal stenosis. MRI THORACIC SPINE FINDINGS Alignment:  Physiologic. Vertebrae: Diffusely abnormal bone marrow signal at T4 with split type fracture and expansion of the left pedicle. There is diffuse abnormal contrast enhancement. Approximately 25% height loss. Cord:  Normal signal and morphology. Paraspinal and other soft tissues: Negative. Disc levels: MRI LUMBAR SPINE FINDINGS Segmentation:  Standard. Alignment:  Physiologic. Vertebrae:  Hemangioma at L1.  No other osseous lesion. Conus medullaris and cauda equina: Conus extends to the L1 level. Conus and cauda equina appear normal. Paraspinal and other soft tissues: Negative Disc levels: L1-L2: Normal disc space and facet joints. No spinal canal stenosis. No neural foraminal stenosis. L2-L3: Normal disc space and facet joints. No spinal canal stenosis. No neural foraminal stenosis. L3-L4: Small disc bulge. No spinal canal stenosis. No neural foraminal stenosis. L4-L5: Small disc bulge with superimposed central protrusion. Left-greater-than-right lateral recess narrowing without central spinal canal stenosis. No neural foraminal stenosis. L5-S1: Left asymmetric disc bulge. Left lateral recess narrowing without central spinal canal stenosis. Moderate left neural foraminal stenosis. Visualized sacrum: Normal. IMPRESSION: 1. Pathologic split type fracture of T4 with approximately 25% height loss, likely due to metastatic disease. 2. No other evidence  of metastatic disease of the cervical, thoracic or lumbar spine. 3. Moderate left L5-S1 neural foraminal stenosis. 4. Left-greater-than-right  lateral recess narrowing at L4-5 and L5-S1. Electronically Signed   By: Ulyses Jarred M.D.   On: 05/14/2021 19:32   MR Lumbar Spine W Wo Contrast  Result Date: 05/14/2021 CLINICAL DATA:  Metastatic disease evaluation EXAM: MRI CERVICAL, THORACIC AND LUMBAR SPINE WITHOUT AND WITH CONTRAST TECHNIQUE: Multiplanar and multiecho pulse sequences of the cervical spine, to include the craniocervical junction and cervicothoracic junction, and thoracic and lumbar spine, were obtained without and with intravenous contrast. CONTRAST:  73m GADAVIST GADOBUTROL 1 MMOL/ML IV SOLN COMPARISON:  None. FINDINGS: MRI CERVICAL SPINE FINDINGS Alignment: Physiologic. Vertebrae: No fracture, evidence of discitis, or bone lesion. Cord: Normal signal and morphology. Posterior Fossa, vertebral arteries, paraspinal tissues: Negative. Disc levels: C1-2: Unremarkable C2-3: Small central disc protrusion. There is no spinal canal stenosis. No neural foraminal stenosis. C3-4: Normal disc space and facet joints. There is no spinal canal stenosis. No neural foraminal stenosis. C4-5: Normal disc space and facet joints. There is no spinal canal stenosis. No neural foraminal stenosis. C5-6: Small central disc protrusion. There is no spinal canal stenosis. No neural foraminal stenosis. C6-7: Normal disc space and facet joints. There is no spinal canal stenosis. No neural foraminal stenosis. C7-T1: Normal disc space and facet joints. There is no spinal canal stenosis. No neural foraminal stenosis. MRI THORACIC SPINE FINDINGS Alignment:  Physiologic. Vertebrae: Diffusely abnormal bone marrow signal at T4 with split type fracture and expansion of the left pedicle. There is diffuse abnormal contrast enhancement. Approximately 25% height loss. Cord:  Normal signal and morphology. Paraspinal and other soft tissues: Negative. Disc levels: MRI LUMBAR SPINE FINDINGS Segmentation:  Standard. Alignment:  Physiologic. Vertebrae:  Hemangioma at L1.  No other  osseous lesion. Conus medullaris and cauda equina: Conus extends to the L1 level. Conus and cauda equina appear normal. Paraspinal and other soft tissues: Negative Disc levels: L1-L2: Normal disc space and facet joints. No spinal canal stenosis. No neural foraminal stenosis. L2-L3: Normal disc space and facet joints. No spinal canal stenosis. No neural foraminal stenosis. L3-L4: Small disc bulge. No spinal canal stenosis. No neural foraminal stenosis. L4-L5: Small disc bulge with superimposed central protrusion. Left-greater-than-right lateral recess narrowing without central spinal canal stenosis. No neural foraminal stenosis. L5-S1: Left asymmetric disc bulge. Left lateral recess narrowing without central spinal canal stenosis. Moderate left neural foraminal stenosis. Visualized sacrum: Normal. IMPRESSION: 1. Pathologic split type fracture of T4 with approximately 25% height loss, likely due to metastatic disease. 2. No other evidence of metastatic disease of the cervical, thoracic or lumbar spine. 3. Moderate left L5-S1 neural foraminal stenosis. 4. Left-greater-than-right lateral recess narrowing at L4-5 and L5-S1. Electronically Signed   By: KUlyses JarredM.D.   On: 05/14/2021 19:32   [place] (LIVER)  Result Date: 05/15/2021 INDICATION: 65year old with evidence of metastatic disease. Patient has a suspicious hepatic lesion and needs a tissue diagnosis. EXAM: ULTRASOUND-GUIDED LIVER LESION BIOPSY MEDICATIONS: Moderate sedation ANESTHESIA/SEDATION: Moderate (conscious) sedation was employed during this procedure. A total of Versed 2.0 mg and Fentanyl 100 mcg was administered intravenously. Moderate Sedation Time: 13 minutes. The patient's level of consciousness and vital signs were monitored continuously by radiology nursing throughout the procedure under my direct supervision. FLUOROSCOPY TIME:  None COMPLICATIONS: None immediate. PROCEDURE: Informed written consent was obtained from the patient after a  thorough discussion of the procedural risks, benefits and alternatives. All questions were addressed. Translator  was used. A timeout was performed prior to the initiation of the procedure. Liver was evaluated with ultrasound. Lesions in the right hepatic lobe identified. The skin was prepped with chlorhexidine and sterile field was created. Maximal barrier sterile technique was utilized including caps, mask, sterile gowns, sterile gloves, sterile drape, hand hygiene and skin antiseptic. Skin was anesthetized using 1% lidocaine. Small incision was made. Using ultrasound guidance, 17 gauge coaxial needle was directed into the right hepatic lobe and into a hypoechoic lesion. Total of 3 core biopsies were obtained with an 18 gauge device. Specimens placed in formalin. Gel-Foam slurry was injected along the needle tract. Bandage placed over the puncture site. FINDINGS: Lateral right hepatic lobe has a nodular contour with a geographic area of decreased echogenicity. There is at least 1 well-defined hypoechoic lesion in this area. This hypoechoic lesion was successfully targeted for biopsy. Needle position was confirmed within the lesion. Trace perihepatic ascites noted before and after the procedure. IMPRESSION: Ultrasound-guided core biopsies of a right hepatic lesion. Electronically Signed   By: Markus Daft M.D.   On: 05/15/2021 17:16   IR IMAGING GUIDED PORT INSERTION  Result Date: 05/19/2021 CLINICAL DATA:  Metastatic cancer, unknown primary EXAM: RIGHT INTERNAL JUGULAR SINGLE LUMEN POWER PORT CATHETER INSERTION Date:  05/19/2021 05/19/2021 1:20 pm Radiologist:  Jerilynn Mages. Daryll Brod, MD Guidance:  Ultrasound and fluoroscopic MEDICATIONS: 1% lidocaine local ANESTHESIA/SEDATION: Versed 2.0 mg IV; Fentanyl 100 mcg IV; Moderate Sedation Time:  22 minutes The patient was continuously monitored during the procedure by the interventional radiology nurse under my direct supervision. FLUOROSCOPY TIME:  0 minutes, 36 seconds (4.0  mGy) COMPLICATIONS: None immediate. CONTRAST:  None. PROCEDURE: Informed consent was obtained from the patient following explanation of the procedure, risks, benefits and alternatives. The patient understands, agrees and consents for the procedure. All questions were addressed. A time out was performed. Maximal barrier sterile technique utilized including caps, mask, sterile gowns, sterile gloves, large sterile drape, hand hygiene, and 2% chlorhexidine scrub. Under sterile conditions and local anesthesia, right internal jugular micropuncture venous access was performed. Access was performed with ultrasound. Images were obtained for documentation of the patent right internal jugular vein. A guide wire was inserted followed by a transitional dilator. This allowed insertion of a guide wire and catheter into the IVC. Measurements were obtained from the SVC / RA junction back to the right IJ venotomy site. In the right infraclavicular chest, a subcutaneous pocket was created over the second anterior rib. This was done under sterile conditions and local anesthesia. 1% lidocaine with epinephrine was utilized for this. A 2.5 cm incision was made in the skin. Blunt dissection was performed to create a subcutaneous pocket over the right pectoralis major muscle. The pocket was flushed with saline vigorously. There was adequate hemostasis. The port catheter was assembled and checked for leakage. The port catheter was secured in the pocket with two retention sutures. The tubing was tunneled subcutaneously to the right venotomy site and inserted into the SVC/RA junction through a valved peel-away sheath. Position was confirmed with fluoroscopy. Images were obtained for documentation. The patient tolerated the procedure well. No immediate complications. Incisions were closed in a two layer fashion with 4 - 0 Vicryl suture. Dermabond was applied to the skin. The port catheter was accessed, blood was aspirated followed by saline and  heparin flushes. Needle was removed. A dry sterile dressing was applied. IMPRESSION: Ultrasound and fluoroscopically guided right internal jugular single lumen power port catheter insertion. Tip in the  SVC/RA junction. Catheter ready for use. Electronically Signed   By: Jerilynn Mages.  Shick M.D.   On: 05/19/2021 13:48   VAS [place] LOWER EXTREMITY VENOUS (DVT)  Result Date: 05/14/2021  Lower Venous DVT Study Patient Name:  [facility]  Date of Exam:   05/14/2021 Medical Rec #: [id number]   Accession #:    [id number] Date of Birth: Sep 08, 1956  Patient Gender: M Patient Age:   64Y Exam Location:  [facility] Procedure:      VAS [place] LOWER EXTREMITY VENOUS (DVT) Referring Phys: Wendee Beavers --------------------------------------------------------------------------------  Indications: Pulmonary embolism.  Anticoagulation: Heparin. Comparison Study: No prior studies. Performing Technologist: Darlin Coco RDMS,RVT  Examination Guidelines: A complete evaluation includes B-mode imaging, spectral Doppler, color Doppler, and power Doppler as needed of all accessible portions of each vessel. Bilateral testing is considered an integral part of a complete examination. Limited examinations for reoccurring indications may be performed as noted. The reflux portion of the exam is performed with the patient in reverse Trendelenburg.  +---------+---------------+---------+-----------+----------+--------------+ RIGHT    CompressibilityPhasicitySpontaneityPropertiesThrombus Aging +---------+---------------+---------+-----------+----------+--------------+ CFV      Full           Yes      Yes                                 +---------+---------------+---------+-----------+----------+--------------+ SFJ      Full                                                        +---------+---------------+---------+-----------+----------+--------------+ FV Prox  Full                                                         +---------+---------------+---------+-----------+----------+--------------+ FV Mid   Full                                                        +---------+---------------+---------+-----------+----------+--------------+ FV DistalFull                                                        +---------+---------------+---------+-----------+----------+--------------+ PFV      Full                                                        +---------+---------------+---------+-----------+----------+--------------+ POP      Full           Yes      Yes                                 +---------+---------------+---------+-----------+----------+--------------+  PTV      Full                                                        +---------+---------------+---------+-----------+----------+--------------+ PERO     Full                                                        +---------+---------------+---------+-----------+----------+--------------+   +---------+---------------+---------+-----------+----------+--------------+ LEFT     CompressibilityPhasicitySpontaneityPropertiesThrombus Aging +---------+---------------+---------+-----------+----------+--------------+ CFV      Full           Yes      Yes                                 +---------+---------------+---------+-----------+----------+--------------+ SFJ      Full                                                        +---------+---------------+---------+-----------+----------+--------------+ FV Prox  Full                                                        +---------+---------------+---------+-----------+----------+--------------+ FV Mid   Full                                                        +---------+---------------+---------+-----------+----------+--------------+ FV DistalFull                                                         +---------+---------------+---------+-----------+----------+--------------+ PFV      Full                                                        +---------+---------------+---------+-----------+----------+--------------+ POP      Full           Yes      Yes                                 +---------+---------------+---------+-----------+----------+--------------+ PTV      Full                                                        +---------+---------------+---------+-----------+----------+--------------+  PERO     Full                                                        +---------+---------------+---------+-----------+----------+--------------+     Summary: RIGHT: - There is no evidence of deep vein thrombosis in the lower extremity.  - No cystic structure found in the popliteal fossa.  LEFT: - There is no evidence of deep vein thrombosis in the lower extremity.  - No cystic structure found in the popliteal fossa.  *See table(s) above for measurements and observations. Electronically signed by Servando Snare MD on 05/14/2021 at 3:49:43 PM.    Final        The results of significant diagnostics from this hospitalization (including imaging, microbiology, ancillary and laboratory) are listed below for reference.     Microbiology: Recent Results (from the past 240 hour(s))  Resp Panel by RT-PCR (Flu A&B, Covid) Nasopharyngeal Swab     Status: None   Collection Time: 05/13/21  3:40 PM   Specimen: Nasopharyngeal Swab; Nasopharyngeal(NP) swabs in vial transport medium  Result Value Ref Range Status   SARS Coronavirus 2 by RT PCR NEGATIVE NEGATIVE Final    Comment: (NOTE) SARS-CoV-2 target nucleic acids are NOT DETECTED.  The SARS-CoV-2 RNA is generally detectable in upper respiratory specimens during the acute phase of infection. The lowest concentration of SARS-CoV-2 viral copies this assay can detect is 138 copies/mL. A negative result does not preclude  SARS-Cov-2 infection and should not be used as the sole basis for treatment or other patient management decisions. A negative result may occur with  improper specimen collection/handling, submission of specimen other than nasopharyngeal swab, presence of viral mutation(s) within the areas targeted by this assay, and inadequate number of viral copies(<138 copies/mL). A negative result must be combined with clinical observations, patient history, and epidemiological information. The expected result is Negative.  Fact Sheet for Patients:  EntrepreneurPulse.com.au  Fact Sheet for Healthcare Providers:  IncredibleEmployment.be  This test is no t yet approved or cleared by the [place] FDA and  has been authorized for detection and/or diagnosis of SARS-CoV-2 by FDA under an Emergency Use Authorization (EUA). This EUA will remain  in effect (meaning this test can be used) for the duration of the COVID-19 declaration under Section 564(b)(1) of the Act, 21 U.S.C.section 360bbb-3(b)(1), unless the authorization is terminated  or revoked sooner.       Influenza A by PCR NEGATIVE NEGATIVE Final   Influenza B by PCR NEGATIVE NEGATIVE Final    Comment: (NOTE) The Xpert Xpress SARS-CoV-2/FLU/RSV plus assay is intended as an aid in the diagnosis of influenza from Nasopharyngeal swab specimens and should not be used as a sole basis for treatment. Nasal washings and aspirates are unacceptable for Xpert Xpress SARS-CoV-2/FLU/RSV testing.  Fact Sheet for Patients: EntrepreneurPulse.com.au  Fact Sheet for Healthcare Providers: IncredibleEmployment.be  This test is not yet approved or cleared by the [place] FDA and has been authorized for detection and/or diagnosis of SARS-CoV-2 by FDA under an Emergency Use Authorization (EUA). This EUA will remain in effect (meaning this test can be used) for the duration of  the COVID-19 declaration under Section 564(b)(1) of the Act, 21 U.S.C. section 360bbb-3(b)(1), unless the authorization is terminated or revoked.  Performed at [facility], [address], [place],  [place] [address]      Labs:  CBC: Recent Labs  Lab 05/14/21 0528 05/15/21 0513 05/16/21 0240 05/18/21 0438 05/19/21 0446  WBC 7.2 6.6 6.9 18.3* 17.2*  HGB 16.0 15.6 14.8 15.7 15.3  HCT 47.6 47.4 44.9 47.8 45.8  MCV 95.8 95.6 95.1 95.2 94.4  PLT 208 217 220 250 245   BMP &GFR Recent Labs  Lab 05/13/21 1128 05/14/21 0528 05/14/21 1628 05/16/21 0240 05/17/21 0532  NA 142 139 139 138 136  K 4.2 3.7 3.8 3.8 4.0  CL 107 104 100 104 105  CO2 25 29 32 25 25  GLUCOSE 90 131* 74 138* 126*  BUN '14 16 17 20 15  '$ CREATININE 0.66 0.76 0.87 0.75 0.56*  CALCIUM 9.2 8.7* 8.8* 8.4* 8.9  MG  --   --  2.5* 2.4  --   PHOS  --   --  3.5 3.2  --    Estimated Creatinine Clearance: 70.1 mL/min (A) (by C-G formula based on SCr of 0.56 mg/dL (L)). Liver & Pancreas: Recent Labs  Lab 05/13/21 1500 05/13/21 2327 05/14/21 1628 05/16/21 0240 05/17/21 0532  AST 61* 57* 52* 52* 57*  ALT 85* 83* 75* 72* 83*  ALKPHOS 578* 521* 487* 448* 469*  BILITOT 0.9 0.5 0.6 0.6 0.6  PROT 7.1 7.2 6.5 6.2* 6.9  ALBUMIN 4.1 3.9 3.5 3.3* 3.7   No results for input(s): LIPASE, AMYLASE in the last 168 hours. No results for input(s): AMMONIA in the last 168 hours. Diabetic: No results for input(s): HGBA1C in the last 72 hours. No results for input(s): GLUCAP in the last 168 hours. Cardiac Enzymes: No results for input(s): CKTOTAL, CKMB, CKMBINDEX, TROPONINI in the last 168 hours. No results for input(s): PROBNP in the last 8760 hours. Coagulation Profile: Recent Labs  Lab 05/15/21 0833  INR 0.9   Thyroid Function Tests: No results for input(s): TSH, T4TOTAL, FREET4, T3FREE, THYROIDAB in the last 72 hours. Lipid Profile: No results for input(s): CHOL, HDL, LDLCALC, TRIG, CHOLHDL,  LDLDIRECT in the last 72 hours. Anemia Panel: No results for input(s): VITAMINB12, FOLATE, FERRITIN, TIBC, IRON, RETICCTPCT in the last 72 hours. Urine analysis:    Component Value Date/Time   COLORURINE YELLOW 08/22/2018 1125   APPEARANCEUR CLEAR 08/22/2018 1125   LABSPEC 1.003 (L) 08/22/2018 1125   PHURINE 6.0 08/22/2018 1125   GLUCOSEU NEGATIVE 08/22/2018 1125   HGBUR LARGE (A) 08/22/2018 1125   BILIRUBINUR NEGATIVE 08/22/2018 1125   KETONESUR NEGATIVE 08/22/2018 1125   PROTEINUR NEGATIVE 08/22/2018 1125   NITRITE NEGATIVE 08/22/2018 1125   LEUKOCYTESUR NEGATIVE 08/22/2018 1125   Sepsis Labs: Invalid input(s): PROCALCITONIN, LACTICIDVEN   Time coordinating discharge: 35 minutes  SIGNED:  Mercy Riding, MD  [facility] 05/19/2021, 10:05 PM  If 7PM-7AM, please contact night-coverage [email or web address]

## 2021-05-19 NOTE — Progress Notes (Signed)
ANTICOAGULATION CONSULT NOTE  Pharmacy Consult for heparin Indication: acute pulmonary embolus  No Known Allergies  Patient Measurements: Height: '5\' 2"'$  (157.5 cm) Weight: 61.2 kg (135 lb) IBW/kg (Calculated) : 54.6 Heparin Dosing Weight: 61 kg  Vital Signs: Temp: 97.7 F (36.5 C) (08/08 2011) Temp Source: Oral (08/08 2011) BP: 136/73 (08/08 2011) Pulse Rate: 70 (08/08 2011)  Labs: Recent Labs    05/16/21 0240 05/16/21 1103 05/17/21 0532 05/17/21 2014 05/18/21 0438 05/18/21 2312  HGB 14.8  --   --   --  15.7  --   HCT 44.9  --   --   --  47.8  --   PLT 220  --   --   --  250  --   HEPARINUNFRC 0.30   < >  --  0.52 0.64 0.35  CREATININE 0.75  --  0.56*  --   --   --    < > = values in this interval not displayed.    Estimated Creatinine Clearance: 70.1 mL/min (A) (by C-G formula based on SCr of 0.56 mg/dL (L)).  Assessment: Patient is a 65 y.o M presented to the ED on 8/3 with c/o back and chest pain.  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  Chest CT on 8/3 showed findings with concern for subsegmental PE in the right lower lobe, but not confirmed due to respiratory motion artifact. LE doppler on 8/4 was negativefor DVT. Repeat chect CT on 8/6 resulted back "positive for subsegmental sized emboli in the right lower lobe." He has been treted with heparin and Lovenox since 8/3. Pharmacy has been reconsulted to transition back to heparin infusion.  Today, 05/19/2021: - Heparin level = 0.35 with heparin gtt @ 1150 units/hr - IR to place port 8/9 - No bleeding documented  Goal of Therapy:  Heparin level 0.3-0.7 units/ml Monitor platelets by anticoagulation protocol: Yes   Plan:  Continue heparin drip at 1150 units/hr  Monitor daily heparin level, CBC, s/s bleeding F/U long-term anticoagulation plan s/p port placement  Everette Rank, PharmD 05/19/21 12:18 AM

## 2021-05-20 ENCOUNTER — Encounter: Payer: Self-pay | Admitting: Hematology and Oncology

## 2021-05-20 DIAGNOSIS — C249 Malignant neoplasm of biliary tract, unspecified: Secondary | ICD-10-CM | POA: Insufficient documentation

## 2021-05-20 LAB — SURGICAL PATHOLOGY

## 2021-05-20 NOTE — Progress Notes (Signed)
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[place]) Reviewed CT imaging and test results with the patient and his son I suspect the primary is either gallbladder or intrahepatic biliary tract cancer He has stage IV disease I explained to the patient and family why surgery is not indicated He had port placement while hospitalized I reviewed with him recent guidelines and update from NCCN  The recommendation is based on NCCN guidelines  ORIGINAL ARTICLE Durvalumab plus Gemcitabine and Cisplatin in Advanced Biliary Tract Cancer Published March 11, 2021 NEJM Evid 2022; 1 (8) [email or web address]  Abstract BACKGROUND Patients with advanced biliary tract cancer have a poor prognosis, and first-line standard of care (gemcitabine plus cisplatin) has remained unchanged for more than 10 years. The TOPAZ-1 trial evaluated durvalumab plus chemotherapy for patients with advanced biliary tract cancer.  METHODS In this double-blind, placebo-controlled, phase 3 study, we randomly assigned patients with previously untreated unresectable or metastatic biliary tract cancer or with recurrent disease 1:1 to receive durvalumab or placebo in combination with gemcitabine plus cisplatin for up to eight cycles, followed by durvalumab or placebo monotherapy until disease progression or unacceptable toxicity. The primary objective was to assess overall survival. Secondary end points included progression-free survival, objective response rate, and safety.  RESULTS Overall, 685 patients were randomly assigned to durvalumab (n=341) or placebo (n=344) with chemotherapy. As of data cutoff, 198 patients (58.1%) in the durvalumab group and 226 patients (65.7%) in the placebo group had died. The hazard ratio for overall survival was 0.80 (95% confidence interval [CI], 0.66 to 0.97; P=0.021). The estimated 30-month overall survival rate was 24.9% (95% CI, 17.9 to 32.5) for durvalumab and 10.4% (95% CI, 4.7 to 18.8) for placebo. The hazard ratio for progression-free survival was 0.75 (95% CI, 0.63 to 0.89; P=0.001). Objective response rates were 26.7% with durvalumab and 18.7% with placebo. The incidences of grade 3 or 4 adverse events were 75.7% and 77.8% with durvalumab and placebo, respectively.  CONCLUSIONS Durvalumab plus chemotherapy significantly improved overall survival versus placebo plus chemotherapy and showed improvements versus placebo plus chemotherapy in prespecified secondary end points including progression-free survival and objective response rate. The safety profiles of the two treatment groups were similar. (Funded by [employer] Bow[employer]gov number, [id number])  We discussed the role of chemotherapy. The intent is strictly palliative  We discussed some of the risks, benefits, side-effects of cisplatin, gemcitabine and Durvalumab.  Some of the short term side-effects included, though not limited to, including weight loss, life threatening infections, risk of allergic reactions, need for transfusions of blood products, nausea, vomiting, change in bowel habits, loss of hair, admission to hospital for various reasons, and risks of death.   Long term side-effects are also discussed including risks of infertility, permanent damage to nerve function, abnormal thyroid function, hearing loss, chronic fatigue, kidney damage with possibility needing hemodialysis, and rare secondary malignancy including bone marrow disorders.  The patient is aware that the response rates discussed earlier is not guaranteed.  After a long discussion, patient made an informed decision to proceed with the prescribed plan of care.   He will attend chemo education class I will get his labs drawn today in anticipation for him to start treatment next week  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([facility] His bone pain  is well controlled with dexamethasone and oxycodone I plan to reduce dexamethasone to twice daily He can take acetaminophen and oxycodone as  needed I will refer him to radiation oncologist for palliative radiation therapy  Pulmonary embolism [facility]) He will continue anticoagulation therapy indefinitely  Other constipation We discussed importance of aggressive laxative therapy  Elevated LFTs His elevated liver enzymes are due to disease Observe closely Since his bilirubin is within normal range, no dose adjustment is required  Goals of care, counseling/discussion He is aware that his disease is incurable but treatable at this point We discussed prognosis with or without chemotherapy I do not advise him to return back to work due to the terminal nature of his disease and high risk of infection and I gave him a letter to bring to his workplace I recommend the patient to apply for permanent disability   Orders Placed This Encounter  Procedures   TSH    Standing Status:   Standing    Number of Occurrences:   22    Standing Expiration Date:   05/21/2022   T4, free    Standing Status:   Standing    Number of Occurrences:   22    Standing Expiration Date:   05/21/2022   Cancer antigen 19-9    Standing Status:   Standing    Number of Occurrences:   33    Standing Expiration Date:   05/21/2022   Ambulatory referral to Radiation Oncology    Referral Priority:   Routine    Referral Type:   Consultation    Referral Reason:   Specialty Services Required    Requested Specialty:   Radiation Oncology    Number of Visits Requested:   1    The total time spent in the appointment was 80 minutes encounter with patients including review of chart and various tests results, discussions about plan of care and coordination of care plan   All questions were answered. The patient knows to call the clinic with any problems, questions or concerns. No barriers to learning was detected.  Heath Lark,  MD 8/11/20225:24 PM  CHIEF COMPLAINTS/PURPOSE OF CONSULTATION:  Biliary tract cancer, for further management  HISTORY OF PRESENTING ILLNESS:  Jason Lyons 65 y.o. male is here because of recent diagnosis of biliary tract cancer with metastatic disease to his bone He is here accompanied by his 2 sons The [place] interpreter is present and was helpful to translate Since he was discharged from the hospital, his back pain is minimum He quit smoking He denies recent constipation since discharge from the hospital  I have reviewed his chart and materials related to his cancer extensively and collaborated history with the patient. Summary of oncologic history is as follows: Oncology History  Cancer of biliary tract [facility])  05/13/2021 - 05/19/2021 Hospital Admission   He was admitted for management of back pain, found to have metastatic cancer and PE. He underwent liver biopsy   05/14/2021 Imaging   RIGHT:  - There is no evidence of deep vein thrombosis in the lower extremity.     - No cystic structure found in the popliteal fossa.     LEFT:  - There is no evidence of deep vein thrombosis in the lower extremity.     - No cystic structure found in the popliteal fossa.    05/14/2021 Imaging   1. Pathologic split type fracture of T4 with approximately 25% height loss, likely due to metastatic disease. 2. No other evidence of metastatic disease of the cervical, thoracic or lumbar spine. 3. Moderate left L5-S1 neural foraminal stenosis. 4. Left-greater-than-right lateral recess narrowing at  L4-5 and L5-S1.     05/14/2021 Imaging   1. 5 cm ill-defined irregular low-density lesion in the anterior right liver inferiorly is highly suspicious for primary or metastatic neoplasm. The lesion obliterates bile ducts in the central right liver resulting in right hepatic lobe biliary dilatation. Similarly, portal venous anatomy in the anterior right liver is involved along the superomedial margin of the lesion.  Lesion should be amenable to tissue sampling. 2. Gallbladder wall is contracted and irregular in appearance adjacent to the lesion. Imaging features suggest that gallbladder changes are secondary although primary gallbladder neoplasm not excluded. 3. 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 No evidence for additional metastatic disease in the abdomen or pelvis. 4. Prostatomegaly. 5. Trace free fluid in the pelvis. 6. Aortic Atherosclerosis (ICD10-I70.0).   05/15/2021 Pathology Results   Clinical History: Right hepatic tumor and evidence for bone metastasis ([facility])    FINAL MICROSCOPIC DIAGNOSIS:   A. LIVER, RIGHT, BIOPSY:  -  Adenocarcinoma  -  See comment   COMMENT:   By immunohistochemistry, the neoplastic cells are positive for cytokeratin 7, monoclonal and polyclonal CEA with patchy positivity for cytokeratin 20.  The tumor cells are negative for TTF-1, AFP, prostein, PSA and CDX2.  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     05/15/2021 Procedure   Ultrasound-guided core biopsies of a right hepatic lesion.   05/16/2021 Imaging   MRI brain  Unremarkable appearance of the brain for age. No evidence of intracranial metastases.     05/16/2021 Imaging   Ct chest  1. Study is once again positive for subsegmental sized emboli in the right lower lobe. No larger central, lobar or segmental sized emboli are noted. 2. Lytic lesion in T4 with pathologic fracture, similar to the prior study. This was better depicted on recent thoracic spine MRI. 3. Aortic atherosclerosis, in addition to left main and 3 vessel coronary artery disease. There is also mild ectasia of the ascending thoracic aorta (4.0 cm in diameter).    05/19/2021 Procedure   Ultrasound and fluoroscopically guided right internal jugular single lumen power port catheter insertion. Tip in the SVC/RA  junction. Catheter ready for use.   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  SURGICAL HISTORY: Past Surgical History:  Procedure Laterality Date   IR IMAGING GUIDED PORT INSERTION  05/19/2021    SOCIAL HISTORY: Social History   Socioeconomic History   Marital status: Married    Spouse name: Not on file   Number of children: 3   Years of education: Not on file   Highest education level: Not on file  Occupational History   Occupation: manual labor  Tobacco Use   Smoking status: Every Day    Packs/day: 0.50    Years: 40.00    Pack years: 20.00    Types: Cigarettes    Passive exposure: Current   Smokeless tobacco: Never  Vaping Use   Vaping Use: Former   Substances: Nicotine, Flavoring  Substance and Sexual Activity   Alcohol use: Yes    Alcohol/week: 1.0 - 2.0 standard drink    Types: 1 - 2 Cans of beer per week    Comment: occ   Drug use: Never   Sexual activity: Yes  Other Topics Concern   Not on file  Social History Narrative   Not on file   Social Determinants of Health   Financial Resource Strain: Not on file  Food Insecurity: Not on file  Transportation Needs: Not on file  Physical Activity: Not on file  Stress: Not on file  Social Connections: Not on file  Intimate Partner Violence: Not on file    FAMILY HISTORY: Family History  Problem Relation Age of Onset   Cancer Father        throat cancer    ALLERGIES:  has No Known Allergies.  MEDICATIONS:  Current Outpatient Medications  Medication Sig Dispense Refill   apixaban (ELIQUIS) 5 MG  TABS tablet Take 1 tablet (5 mg total) by mouth 2 (two) times daily. 60 tablet 1   dexamethasone (DECADRON) 4 MG tablet Take 1 tablet (4 mg total) by mouth 2 (two) times daily for 7 days. 21 tablet 0   lidocaine-prilocaine (EMLA) cream Apply to affected area once 30 g 3   ondansetron (ZOFRAN ODT) 4 MG disintegrating tablet Take 1 tablet (4 mg total) by mouth every 8 (eight) hours as needed for nausea or vomiting. 20 tablet 0   ondansetron (ZOFRAN) 8 MG tablet Take 1 tablet by mouth every 8 hours as needed. 30 tablet 1   oxyCODONE (OXY IR/ROXICODONE) 5 MG immediate release tablet Take 1 tablet (5 mg total) by mouth every 6 (six) hours as needed for up to 5 days for severe pain or moderate pain. 20 tablet 0   prochlorperazine (COMPAZINE) 10 MG tablet Take 1 tablet by mouth every 6 hours as needed (Nausea or vomiting). 30 tablet 1   senna-docusate (SENOKOT-S) 8.6-50 MG tablet Take 1 tablet by mouth 2 (two) times daily as needed for mild constipation or moderate constipation.     No current facility-administered medications for this visit.    REVIEW OF SYSTEMS:   Constitutional: Denies fevers, chills or abnormal night sweats Eyes: Denies blurriness of vision, double vision or watery eyes Ears, nose, mouth, throat, and face: Denies mucositis or sore throat Respiratory: Denies cough, dyspnea or wheezes Cardiovascular: Denies palpitation, chest discomfort or lower extremity swelling Skin: Denies abnormal skin rashes Lymphatics: Denies new lymphadenopathy or easy bruising Neurological:Denies numbness, tingling or new weaknesses Behavioral/Psych: Mood is stable, no new changes  All other systems were reviewed with the patient and are negative.  PHYSICAL EXAMINATION: ECOG PERFORMANCE STATUS: 1 - Symptomatic but completely ambulatory  Vitals:   05/21/21 1345  BP: 133/75  Pulse: (!) 57  Resp: 18  Temp: 98.1 F (36.7 C)  SpO2: 99%   Filed Weights   05/21/21 1345  Weight: 135 lb 12.8 oz (61.6  kg)    GENERAL:alert, no distress and comfortable SKIN: skin color, texture, turgor are normal, no rashes or significant lesions EYES: normal, conjunctiva are pink and non-injected, sclera clear OROPHARYNX:no exudate, no erythema and lips, buccal mucosa, and tongue normal  NECK: supple, thyroid normal size, non-tender, without nodularity LYMPH:  no palpable lymphadenopathy in the cervical, axillary or inguinal LUNGS: clear to auscultation and percussion with normal breathing effort HEART: regular rate & rhythm and no murmurs and no lower extremity edema ABDOMEN:abdomen soft, non-tender and normal bowel sounds  Musculoskeletal:no cyanosis of digits and no clubbing  PSYCH: alert & oriented x 3 with fluent speech NEURO: no focal motor/sensory deficits  LABORATORY DATA:  I have reviewed the data as listed Lab Results  Component Value Date   WBC 16.9 (H) 05/21/2021   HGB 15.5 05/21/2021   HCT 45.2 05/21/2021   MCV 92.4 05/21/2021   PLT 247 05/21/2021   Recent Labs    05/13/21 1500 05/13/21 2327 05/14/21 0528 05/16/21 0240 05/17/21 0532 05/21/21 1502  NA  --   --    < > 138 136 141  K  --   --    < > 3.8 4.0 5.1  CL  --   --    < > 104 105 105  CO2  --   --    < > '25 25 27  '$ GLUCOSE  --   --    < > 138* 126* 110*  BUN  --   --    < > '20 15 22  '$ CREATININE  --   --    < > 0.75 0.56* 0.84  CALCIUM  --   --    < > 8.4* 8.9 9.4  GFRNONAA  --   --    < > >60 >60 >60  PROT 7.1 7.2   < > 6.2* 6.9 7.1  ALBUMIN 4.1 3.9   < > 3.3* 3.7 3.5  AST 61* 57*   < > 52* 57* 110*  ALT 85* 83*   < > 72* 83* 236*  ALKPHOS 578* 521*   < > 448* 469* 579*  BILITOT 0.9 0.5   < > 0.6 0.6 0.4  BILIDIR 0.1 <0.1  --   --   --   --   IBILI 0.8 NOT CALCULATED  --   --   --   --    < > = values in this interval not displayed.    RADIOGRAPHIC STUDIES: I have reviewed imaging studies with the patient and family I have personally reviewed the radiological images as listed and agreed with the findings in  the report. CT ABDOMEN PELVIS W WO CONTRAST  Result Date: 05/15/2021 CLINICAL DATA:  Masslike area identified in the liver on recent CTA Chest. EXAM: CT ABDOMEN AND PELVIS WITHOUT AND WITH CONTRAST TECHNIQUE: Multidetector CT imaging of the abdomen and pelvis was performed following the standard protocol before and following the bolus administration of intravenous contrast. CONTRAST:  141m OMNIPAQUE IOHEXOL 350 MG/ML SOLN COMPARISON:  CTA Chest 05/13/2021 FINDINGS: Lower chest: Dependent atelectasis in both lower lobes. Hepatobiliary: 5.0 x 3.3 x 4.5 cm ill-defined irregular low-density lesion is identified in the anterior right liver inferiorly. Arterial phase imaging shows subtle irregular peripheral enhancement with heterogeneous enhancement in the lesion inferiorly. Lesion takes on a more infiltrative appearance at the inferior tip of the liver extending more posteriorly. The mass tracks along the gallbladder fossa towards the fundus in the gallbladder is contracted and irregular in appearance at this location. Focal biliary dilatation is seen in the anterior right liver (segment VIII) into a lesser degree in the posterior right liver (segments VI and VII). There is abrupt cut off of the dilated bile ducts along the medial aspect of the right hepatic lobe lesion. Similarly, portal venous anatomy appears obliterated centrally in the anterior right liver along the superomedial margin of the lesion. As above, gallbladder appears irregular with diffuse wall thickening. No extrahepatic biliary duct dilatation. Pancreas: No focal mass lesion. No dilatation of the  main duct. No intraparenchymal cyst. No peripancreatic edema. Spleen: No splenomegaly. No focal mass lesion. Adrenals/Urinary Tract: No adrenal nodule or mass. Kidneys unremarkable. No evidence for hydroureter. The urinary bladder appears normal for the degree of distention. Stomach/Bowel: Stomach is unremarkable. No gastric wall thickening. No evidence of  outlet obstruction. Duodenum is normally positioned as is the ligament of Treitz. No small bowel wall thickening. No small bowel dilatation. The terminal ileum is normal. The appendix is normal. No gross colonic mass. No colonic wall thickening. Vascular/Lymphatic: Insert calcium aorta upper normal lymph nodes are seen in the hepatoduodenal ligament. No retroperitoneal lymphadenopathy No pelvic sidewall lymphadenopathy. Reproductive: Prostate gland is enlarged with heterogeneous enhancement and dystrophic calcification. Other: Trace free fluid is noted in the pelvis. Musculoskeletal: No worrisome lytic or sclerotic osseous abnormality. IMPRESSION: 1. 5 cm ill-defined irregular low-density lesion in the anterior right liver inferiorly is highly suspicious for primary or metastatic neoplasm. The lesion obliterates bile ducts in the central right liver resulting in right hepatic lobe biliary dilatation. Similarly, portal venous anatomy in the anterior right liver is involved along the superomedial margin of the lesion. Lesion should be amenable to tissue sampling. 2. Gallbladder wall is contracted and irregular in appearance adjacent to the lesion. Imaging features suggest that gallbladder changes are secondary although primary gallbladder neoplasm not excluded. 3. 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 No evidence for additional metastatic disease in the abdomen or pelvis. 4. Prostatomegaly. 5. Trace free fluid in the pelvis. 6. Aortic Atherosclerosis (ICD10-I70.0). Electronically Signed   By: Misty Stanley M.D.   On: 05/15/2021 05:53   DG Chest 2 View  Result Date: 05/13/2021 CLINICAL DATA:  Chest pain. EXAM: CHEST - 2 VIEW COMPARISON:  No prior. FINDINGS: Mediastinum and hilar structures normal. Low lung volumes with mild bibasilar atelectasis. No pleural effusion or pneumothorax. Heart size normal. Mild thoracic spine scoliosis. No acute bony abnormality. IMPRESSION: Low lung volumes with mild  bibasilar atelectasis. Electronically Signed   By: Marcello Moores  Register   On: 05/13/2021 12:04   CT Angio Chest Pulmonary Embolism (PE) W or WO Contrast  Result Date: 05/16/2021 CLINICAL DATA:  65 year old male with clinical concern for pulmonary embolism. Follow-up study. EXAM: CT ANGIOGRAPHY CHEST WITH CONTRAST TECHNIQUE: Multidetector CT imaging of the chest was performed using the standard protocol during bolus administration of intravenous contrast. Multiplanar CT image reconstructions and MIPs were obtained to evaluate the vascular anatomy. CONTRAST:  36m OMNIPAQUE IOHEXOL 350 MG/ML SOLN COMPARISON:  Chest CTA 05/13/2021. FINDINGS: Cardiovascular: Again noted are small subsegmental sized filling defects in pulmonary artery branches in the right lower lobe best appreciated on axial images 169-178 of series 5. No other central, lobar or segmental sized filling defects are noted elsewhere in the pulmonary arterial tree. Heart size is normal. There is no significant pericardial fluid, thickening or pericardial calcification. There is aortic atherosclerosis, as well as atherosclerosis of the great vessels of the mediastinum and the coronary arteries, including calcified atherosclerotic plaque in the left main, left anterior descending and left circumflex coronary arteries. Ectasia of ascending thoracic aorta (4.0 cm in diameter). Mediastinum/Nodes: No pathologically enlarged mediastinal or hilar lymph nodes. Esophagus is unremarkable in appearance. No axillary lymphadenopathy. Lungs/Pleura: Dependent areas of subsegmental atelectasis are noted in the lower lobes of the lungs bilaterally. No acute consolidative airspace disease. No pleural effusions. 3 mm pulmonary nodule in the apex of the left upper lobe (axial image 28 of series 6). No other larger more suspicious appearing pulmonary nodules  or masses are noted. Upper Abdomen: Please see dictation for CT of the abdomen and pelvis 05/14/2021 for full description  of findings below the diaphragm, which includes a large liver mass. Musculoskeletal: Lytic lesion in L4 with pathologic fracture resulting in 25% loss of posterior vertebral body height and 4 mm of retropulsion of fracture fragments impinging upon the central spinal canal. Review of the MIP images confirms the above findings. IMPRESSION: 1. Study is once again positive for subsegmental sized emboli in the right lower lobe. No larger central, lobar or segmental sized emboli are noted. 2. Lytic lesion in T4 with pathologic fracture, similar to the prior study. This was better depicted on recent thoracic spine MRI. 3. Aortic atherosclerosis, in addition to left main and 3 vessel coronary artery disease. There is also mild ectasia of the ascending thoracic aorta (4.0 cm in diameter). Recommend annual imaging followup by CTA or MRA. This recommendation follows 2010 ACCF/AHA/AATS/ACR/ASA/SCA/SCAI/SIR/STS/SVM Guidelines for the Diagnosis and Management of Patients with Thoracic Aortic Disease. Circulation. 2010; [id number]. Aortic aneurysm NOS (ICD10-I71.9). Aortic Atherosclerosis (ICD10-I70.0). Electronically Signed   By: Vinnie Langton M.D.   On: 05/16/2021 10:24   CT Angio Chest PE W and/or Wo Contrast  Addendum Date: 05/15/2021   ADDENDUM REPORT: 05/15/2021 10:12 ADDENDUM: Case was reviewed with multiple radiologists, subsegmental PE in the right lower lobe is possible, however conclusive evaluation is limited due to respiratory motion artifact. If the presence or absence of subsegmental pulmonary embolus would change management, repeat chest CTA could be considered. Electronically Signed   By: Yetta Glassman MD   On: 05/15/2021 10:12   Result Date: 05/15/2021 CLINICAL DATA:  Shortness of breath EXAM: CT ANGIOGRAPHY CHEST WITH CONTRAST TECHNIQUE: Multidetector CT imaging of the chest was performed using the standard protocol during bolus administration of intravenous contrast. Multiplanar CT image  reconstructions and MIPs were obtained to evaluate the vascular anatomy. CONTRAST:  21m OMNIPAQUE IOHEXOL 350 MG/ML SOLN COMPARISON:  None. FINDINGS: Cardiovascular: Adequate contrast opacification of the pulmonary arteries. Subsegmental pulmonary embolus seen in the right lower lobe pulmonary arteries. There is normal heart size. No pericardial effusion. 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 Mediastinum/Nodes: No enlarged mediastinal, hilar, or axillary lymph nodes. Thyroid gland, trachea, and esophagus demonstrate no significant findings. Lungs/Pleura: Lungs are clear. No pleural effusion or pneumothorax. No Upper Abdomen: Ill-defined masslike area seen in the right lobe of the liver measuring approximately 6.2 x 5.2 cm with adjacent areas of linear low density. Musculoskeletal: Lytic lesion of the T4 vertebral body with possible soft tissue extension into the left neural foramen. No evidence of retropulsion Review of the MIP images confirms the above findings. IMPRESSION: Positive for subsegmental pulmonary embolus in the right lower lobe. Ill-defined masslike area seen in the right lobe of the liver with adjacent intrahepatic biliary ductal dilation. 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 Possible soft tissue extension into the left neural foramen. Finding could be further evaluated with contrast enhanced MRI of the spine. Critical Value/emergent results were called by telephone at the time of interpretation on 05/13/2021 at 2:00 pm [facility] verbally acknowledged these results. Electronically Signed: By: LYetta GlassmanMD On: 05/13/2021 14:01   MR BRAIN W WO CONTRAST  Result Date: 05/16/2021 CLINICAL DATA:  Cancer of unknown primary, staging metastatic cancer. EXAM: MRI HEAD WITHOUT AND WITH CONTRAST TECHNIQUE: Multiplanar, multiecho pulse sequences  of the  brain and surrounding structures were obtained without and with intravenous contrast. CONTRAST:  48m GADAVIST GADOBUTROL 1 MMOL/ML IV SOLN COMPARISON:  None. FINDINGS: Brain: There is no evidence of an acute infarct, intracranial hemorrhage, mass, midline shift, or extra-axial fluid collection. Mild cerebral atrophy is within normal limits for age. No significant white matter disease is seen. No abnormal enhancement is identified. Vascular: Major intracranial vascular flow voids are preserved. Skull and upper cervical spine: Unremarkable bone marrow signal. Sinuses/Orbits: Unremarkable orbits. Paranasal sinuses and mastoid air cells are clear. Other: None. IMPRESSION: Unremarkable appearance of the brain for age. No evidence of intracranial metastases. Electronically Signed   By: ALogan BoresM.D.   On: 05/16/2021 17:38   MR CERVICAL SPINE W WO CONTRAST  Result Date: 05/14/2021 CLINICAL DATA:  Metastatic disease evaluation EXAM: MRI CERVICAL, THORACIC AND LUMBAR SPINE WITHOUT AND WITH CONTRAST TECHNIQUE: Multiplanar and multiecho pulse sequences of the cervical spine, to include the craniocervical junction and cervicothoracic junction, and thoracic and lumbar spine, were obtained without and with intravenous contrast. CONTRAST:  662mGADAVIST GADOBUTROL 1 MMOL/ML IV SOLN COMPARISON:  None. FINDINGS: MRI CERVICAL SPINE FINDINGS Alignment: Physiologic. Vertebrae: No fracture, evidence of discitis, or bone lesion. Cord: Normal signal and morphology. Posterior Fossa, vertebral arteries, paraspinal tissues: Negative. Disc levels: C1-2: Unremarkable C2-3: Small central disc protrusion. There is no spinal canal stenosis. No neural foraminal stenosis. C3-4: Normal disc space and facet joints. There is no spinal canal stenosis. No neural foraminal stenosis. C4-5: Normal disc space and facet joints. There is no spinal canal stenosis. No neural foraminal stenosis. C5-6: Small central disc protrusion. There is no spinal canal  stenosis. No neural foraminal stenosis. C6-7: Normal disc space and facet joints. There is no spinal canal stenosis. No neural foraminal stenosis. C7-T1: Normal disc space and facet joints. There is no spinal canal stenosis. No neural foraminal stenosis. MRI THORACIC SPINE FINDINGS Alignment:  Physiologic. Vertebrae: Diffusely abnormal bone marrow signal at T4 with split type fracture and expansion of the left pedicle. There is diffuse abnormal contrast enhancement. Approximately 25% height loss. Cord:  Normal signal and morphology. Paraspinal and other soft tissues: Negative. Disc levels: MRI LUMBAR SPINE FINDINGS Segmentation:  Standard. Alignment:  Physiologic. Vertebrae:  Hemangioma at L1.  No other osseous lesion. Conus medullaris and cauda equina: Conus extends to the L1 level. Conus and cauda equina appear normal. Paraspinal and other soft tissues: Negative Disc levels: L1-L2: Normal disc space and facet joints. No spinal canal stenosis. No neural foraminal stenosis. L2-L3: Normal disc space and facet joints. No spinal canal stenosis. No neural foraminal stenosis. L3-L4: Small disc bulge. No spinal canal stenosis. No neural foraminal stenosis. L4-L5: Small disc bulge with superimposed central protrusion. Left-greater-than-right lateral recess narrowing without central spinal canal stenosis. No neural foraminal stenosis. L5-S1: Left asymmetric disc bulge. Left lateral recess narrowing without central spinal canal stenosis. Moderate left neural foraminal stenosis. Visualized sacrum: Normal. IMPRESSION: 1. Pathologic split type fracture of T4 with approximately 25% height loss, likely due to metastatic disease. 2. No other evidence of metastatic disease of the cervical, thoracic or lumbar spine. 3. Moderate left L5-S1 neural foraminal stenosis. 4. Left-greater-than-right lateral recess narrowing at L4-5 and L5-S1. Electronically Signed   By: KeUlyses Jarred.D.   On: 05/14/2021 19:32   MR THORACIC SPINE W WO  CONTRAST  Result Date: 05/14/2021 CLINICAL DATA:  Metastatic disease evaluation EXAM: MRI CERVICAL, THORACIC AND LUMBAR SPINE WITHOUT AND WITH CONTRAST TECHNIQUE: Multiplanar and multiecho  pulse sequences of the cervical spine, to include the craniocervical junction and cervicothoracic junction, and thoracic and lumbar spine, were obtained without and with intravenous contrast. CONTRAST:  14m GADAVIST GADOBUTROL 1 MMOL/ML IV SOLN COMPARISON:  None. FINDINGS: MRI CERVICAL SPINE FINDINGS Alignment: Physiologic. Vertebrae: No fracture, evidence of discitis, or bone lesion. Cord: Normal signal and morphology. Posterior Fossa, vertebral arteries, paraspinal tissues: Negative. Disc levels: C1-2: Unremarkable C2-3: Small central disc protrusion. There is no spinal canal stenosis. No neural foraminal stenosis. C3-4: Normal disc space and facet joints. There is no spinal canal stenosis. No neural foraminal stenosis. C4-5: Normal disc space and facet joints. There is no spinal canal stenosis. No neural foraminal stenosis. C5-6: Small central disc protrusion. There is no spinal canal stenosis. No neural foraminal stenosis. C6-7: Normal disc space and facet joints. There is no spinal canal stenosis. No neural foraminal stenosis. C7-T1: Normal disc space and facet joints. There is no spinal canal stenosis. No neural foraminal stenosis. MRI THORACIC SPINE FINDINGS Alignment:  Physiologic. Vertebrae: Diffusely abnormal bone marrow signal at T4 with split type fracture and expansion of the left pedicle. There is diffuse abnormal contrast enhancement. Approximately 25% height loss. Cord:  Normal signal and morphology. Paraspinal and other soft tissues: Negative. Disc levels: MRI LUMBAR SPINE FINDINGS Segmentation:  Standard. Alignment:  Physiologic. Vertebrae:  Hemangioma at L1.  No other osseous lesion. Conus medullaris and cauda equina: Conus extends to the L1 level. Conus and cauda equina appear normal. Paraspinal and other soft  tissues: Negative Disc levels: L1-L2: Normal disc space and facet joints. No spinal canal stenosis. No neural foraminal stenosis. L2-L3: Normal disc space and facet joints. No spinal canal stenosis. No neural foraminal stenosis. L3-L4: Small disc bulge. No spinal canal stenosis. No neural foraminal stenosis. L4-L5: Small disc bulge with superimposed central protrusion. Left-greater-than-right lateral recess narrowing without central spinal canal stenosis. No neural foraminal stenosis. L5-S1: Left asymmetric disc bulge. Left lateral recess narrowing without central spinal canal stenosis. Moderate left neural foraminal stenosis. Visualized sacrum: Normal. IMPRESSION: 1. Pathologic split type fracture of T4 with approximately 25% height loss, likely due to metastatic disease. 2. No other evidence of metastatic disease of the cervical, thoracic or lumbar spine. 3. Moderate left L5-S1 neural foraminal stenosis. 4. Left-greater-than-right lateral recess narrowing at L4-5 and L5-S1. Electronically Signed   By: KUlyses JarredM.D.   On: 05/14/2021 19:32   MR Lumbar Spine W Wo Contrast  Result Date: 05/14/2021 CLINICAL DATA:  Metastatic disease evaluation EXAM: MRI CERVICAL, THORACIC AND LUMBAR SPINE WITHOUT AND WITH CONTRAST TECHNIQUE: Multiplanar and multiecho pulse sequences of the cervical spine, to include the craniocervical junction and cervicothoracic junction, and thoracic and lumbar spine, were obtained without and with intravenous contrast. CONTRAST:  665mGADAVIST GADOBUTROL 1 MMOL/ML IV SOLN COMPARISON:  None. FINDINGS: MRI CERVICAL SPINE FINDINGS Alignment: Physiologic. Vertebrae: No fracture, evidence of discitis, or bone lesion. Cord: Normal signal and morphology. Posterior Fossa, vertebral arteries, paraspinal tissues: Negative. Disc levels: C1-2: Unremarkable C2-3: Small central disc protrusion. There is no spinal canal stenosis. No neural foraminal stenosis. C3-4: Normal disc space and facet joints. There  is no spinal canal stenosis. No neural foraminal stenosis. C4-5: Normal disc space and facet joints. There is no spinal canal stenosis. No neural foraminal stenosis. C5-6: Small central disc protrusion. There is no spinal canal stenosis. No neural foraminal stenosis. C6-7: Normal disc space and facet joints. There is no spinal canal stenosis. No neural foraminal stenosis. C7-T1: Normal disc space and  facet joints. There is no spinal canal stenosis. No neural foraminal stenosis. MRI THORACIC SPINE FINDINGS Alignment:  Physiologic. Vertebrae: Diffusely abnormal bone marrow signal at T4 with split type fracture and expansion of the left pedicle. There is diffuse abnormal contrast enhancement. Approximately 25% height loss. Cord:  Normal signal and morphology. Paraspinal and other soft tissues: Negative. Disc levels: MRI LUMBAR SPINE FINDINGS Segmentation:  Standard. Alignment:  Physiologic. Vertebrae:  Hemangioma at L1.  No other osseous lesion. Conus medullaris and cauda equina: Conus extends to the L1 level. Conus and cauda equina appear normal. Paraspinal and other soft tissues: Negative Disc levels: L1-L2: Normal disc space and facet joints. No spinal canal stenosis. No neural foraminal stenosis. L2-L3: Normal disc space and facet joints. No spinal canal stenosis. No neural foraminal stenosis. L3-L4: Small disc bulge. No spinal canal stenosis. No neural foraminal stenosis. L4-L5: Small disc bulge with superimposed central protrusion. Left-greater-than-right lateral recess narrowing without central spinal canal stenosis. No neural foraminal stenosis. L5-S1: Left asymmetric disc bulge. Left lateral recess narrowing without central spinal canal stenosis. Moderate left neural foraminal stenosis. Visualized sacrum: Normal. IMPRESSION: 1. Pathologic split type fracture of T4 with approximately 25% height loss, likely due to metastatic disease. 2. No other evidence of metastatic disease of the cervical, thoracic or lumbar  spine. 3. Moderate left L5-S1 neural foraminal stenosis. 4. Left-greater-than-right lateral recess narrowing at L4-5 and L5-S1. Electronically Signed   By: Ulyses Jarred M.D.   On: 05/14/2021 19:32   US BIOPSY (LIVER)  Result Date: 05/15/2021 INDICATION: 65 year old with evidence of metastatic disease. Patient has a suspicious hepatic lesion and needs a tissue diagnosis. EXAM: ULTRASOUND-GUIDED LIVER LESION BIOPSY MEDICATIONS: Moderate sedation ANESTHESIA/SEDATION: Moderate (conscious) sedation was employed during this procedure. A total of Versed 2.0 mg and Fentanyl 100 mcg was administered intravenously. Moderate Sedation Time: 13 minutes. The patient's level of consciousness and vital signs were monitored continuously by radiology nursing throughout the procedure under my direct supervision. FLUOROSCOPY TIME:  None COMPLICATIONS: None immediate. PROCEDURE: Informed written consent was obtained from the patient after a thorough discussion of the procedural risks, benefits and alternatives. All questions were addressed. Translator was used. A timeout was performed prior to the initiation of the procedure. Liver was evaluated with ultrasound. Lesions in the right hepatic lobe identified. The skin was prepped with chlorhexidine and sterile field was created. Maximal barrier sterile technique was utilized including caps, mask, sterile gowns, sterile gloves, sterile drape, hand hygiene and skin antiseptic. Skin was anesthetized using 1% lidocaine. Small incision was made. Using ultrasound guidance, 17 gauge coaxial needle was directed into the right hepatic lobe and into a hypoechoic lesion. Total of 3 core biopsies were obtained with an 18 gauge device. Specimens placed in formalin. Gel-Foam slurry was injected along the needle tract. Bandage placed over the puncture site. FINDINGS: Lateral right hepatic lobe has a nodular contour with a geographic area of decreased echogenicity. There is at least 1 well-defined  hypoechoic lesion in this area. This hypoechoic lesion was successfully targeted for biopsy. Needle position was confirmed within the lesion. Trace perihepatic ascites noted before and after the procedure. IMPRESSION: Ultrasound-guided core biopsies of a right hepatic lesion. Electronically Signed   By: Markus Daft M.D.   On: 05/15/2021 17:16   IR IMAGING GUIDED PORT INSERTION  Result Date: 05/19/2021 CLINICAL DATA:  Metastatic cancer, unknown primary EXAM: RIGHT INTERNAL JUGULAR SINGLE LUMEN POWER PORT CATHETER INSERTION Date:  05/19/2021 05/19/2021 1:20 pm Radiologist:  Jerilynn Mages. Daryll Brod, MD Guidance:  Ultrasound and fluoroscopic MEDICATIONS: 1% lidocaine local ANESTHESIA/SEDATION: Versed 2.0 mg IV; Fentanyl 100 mcg IV; Moderate Sedation Time:  22 minutes The patient was continuously monitored during the procedure by the interventional radiology nurse under my direct supervision. FLUOROSCOPY TIME:  0 minutes, 36 seconds (4.0 mGy) COMPLICATIONS: None immediate. CONTRAST:  None. PROCEDURE: Informed consent was obtained from the patient following explanation of the procedure, risks, benefits and alternatives. The patient understands, agrees and consents for the procedure. All questions were addressed. A time out was performed. Maximal barrier sterile technique utilized including caps, mask, sterile gowns, sterile gloves, large sterile drape, hand hygiene, and 2% chlorhexidine scrub. Under sterile conditions and local anesthesia, right internal jugular micropuncture venous access was performed. Access was performed with ultrasound. Images were obtained for documentation of the patent right internal jugular vein. A guide wire was inserted followed by a transitional dilator. This allowed insertion of a guide wire and catheter into the IVC. Measurements were obtained from the SVC / RA junction back to the right IJ venotomy site. In the right infraclavicular chest, a subcutaneous pocket was created over the second anterior  rib. This was done under sterile conditions and local anesthesia. 1% lidocaine with epinephrine was utilized for this. A 2.5 cm incision was made in the skin. Blunt dissection was performed to create a subcutaneous pocket over the right pectoralis major muscle. The pocket was flushed with saline vigorously. There was adequate hemostasis. The port catheter was assembled and checked for leakage. The port catheter was secured in the pocket with two retention sutures. The tubing was tunneled subcutaneously to the right venotomy site and inserted into the SVC/RA junction through a valved peel-away sheath. Position was confirmed with fluoroscopy. Images were obtained for documentation. The patient tolerated the procedure well. No immediate complications. Incisions were closed in a two layer fashion with 4 - 0 Vicryl suture. Dermabond was applied to the skin. The port catheter was accessed, blood was aspirated followed by saline and heparin flushes. Needle was removed. A dry sterile dressing was applied. IMPRESSION: Ultrasound and fluoroscopically guided right internal jugular single lumen power port catheter insertion. Tip in the SVC/RA junction. Catheter ready for use. Electronically Signed   By: Jerilynn Mages.  Shick M.D.   On: 05/19/2021 13:48   VAS [place] LOWER EXTREMITY VENOUS (DVT)  Result Date: 05/14/2021  Lower Venous DVT Study Patient Name:  [facility]  Date of Exam:   05/14/2021 Medical Rec #: [id number]   Accession #:    [id number] Date of Birth: 04-Nov-1955  Patient Gender: M Patient Age:   64Y Exam Location:  [facility] Procedure:      VAS [place] LOWER EXTREMITY VENOUS (DVT) Referring Phys: Wendee Beavers --------------------------------------------------------------------------------  Indications: Pulmonary embolism.  Anticoagulation: Heparin. Comparison Study: No prior studies. Performing Technologist: Darlin Coco RDMS,RVT  Examination Guidelines: A complete evaluation includes B-mode imaging, spectral Doppler, color  Doppler, and power Doppler as needed of all accessible portions of each vessel. Bilateral testing is considered an integral part of a complete examination. Limited examinations for reoccurring indications may be performed as noted. The reflux portion of the exam is performed with the patient in reverse Trendelenburg.  +---------+---------------+---------+-----------+----------+--------------+ RIGHT    CompressibilityPhasicitySpontaneityPropertiesThrombus Aging +---------+---------------+---------+-----------+----------+--------------+ CFV      Full           Yes      Yes                                 +---------+---------------+---------+-----------+----------+--------------+  SFJ      Full                                                        +---------+---------------+---------+-----------+----------+--------------+ FV Prox  Full                                                        +---------+---------------+---------+-----------+----------+--------------+ FV Mid   Full                                                        +---------+---------------+---------+-----------+----------+--------------+ FV DistalFull                                                        +---------+---------------+---------+-----------+----------+--------------+ PFV      Full                                                        +---------+---------------+---------+-----------+----------+--------------+ POP      Full           Yes      Yes                                 +---------+---------------+---------+-----------+----------+--------------+ PTV      Full                                                        +---------+---------------+---------+-----------+----------+--------------+ PERO     Full                                                        +---------+---------------+---------+-----------+----------+--------------+    +---------+---------------+---------+-----------+----------+--------------+ LEFT     CompressibilityPhasicitySpontaneityPropertiesThrombus Aging +---------+---------------+---------+-----------+----------+--------------+ CFV      Full           Yes      Yes                                 +---------+---------------+---------+-----------+----------+--------------+ SFJ      Full                                                        +---------+---------------+---------+-----------+----------+--------------+  FV Prox  Full                                                        +---------+---------------+---------+-----------+----------+--------------+ FV Mid   Full                                                        +---------+---------------+---------+-----------+----------+--------------+ FV DistalFull                                                        +---------+---------------+---------+-----------+----------+--------------+ PFV      Full                                                        +---------+---------------+---------+-----------+----------+--------------+ POP      Full           Yes      Yes                                 +---------+---------------+---------+-----------+----------+--------------+ PTV      Full                                                        +---------+---------------+---------+-----------+----------+--------------+ PERO     Full                                                        +---------+---------------+---------+-----------+----------+--------------+     Summary: RIGHT: - There is no evidence of deep vein thrombosis in the lower extremity.  - No cystic structure found in the popliteal fossa.  LEFT: - There is no evidence of deep vein thrombosis in the lower extremity.  - No cystic structure found in the popliteal fossa.  *See table(s) above for measurements and observations. Electronically signed  by Servando Snare MD on 05/14/2021 at 3:49:43 PM.    Final

## 2021-05-21 ENCOUNTER — Encounter: Payer: Self-pay | Admitting: Hematology and Oncology

## 2021-05-21 ENCOUNTER — Other Ambulatory Visit: Payer: Self-pay | Admitting: Hematology and Oncology

## 2021-05-21 ENCOUNTER — Inpatient Hospital Stay: Payer: Managed Care, Other (non HMO)

## 2021-05-21 ENCOUNTER — Other Ambulatory Visit (HOSPITAL_COMMUNITY): Payer: Self-pay

## 2021-05-21 ENCOUNTER — Inpatient Hospital Stay (HOSPITAL_BASED_OUTPATIENT_CLINIC_OR_DEPARTMENT_OTHER): Payer: Managed Care, Other (non HMO) | Admitting: Hematology and Oncology

## 2021-05-21 ENCOUNTER — Other Ambulatory Visit: Payer: Self-pay

## 2021-05-21 VITALS — BP 133/75 | HR 57 | Temp 98.1°F | Resp 18 | Ht 62.0 in | Wt 135.8 lb

## 2021-05-21 DIAGNOSIS — Z7901 Long term (current) use of anticoagulants: Secondary | ICD-10-CM | POA: Insufficient documentation

## 2021-05-21 DIAGNOSIS — Z5112 Encounter for antineoplastic immunotherapy: Secondary | ICD-10-CM | POA: Insufficient documentation

## 2021-05-21 DIAGNOSIS — C7951 Secondary malignant neoplasm of bone: Secondary | ICD-10-CM | POA: Insufficient documentation

## 2021-05-21 DIAGNOSIS — Z5111 Encounter for antineoplastic chemotherapy: Secondary | ICD-10-CM | POA: Insufficient documentation

## 2021-05-21 DIAGNOSIS — I2693 Single subsegmental pulmonary embolism without acute cor pulmonale: Secondary | ICD-10-CM | POA: Diagnosis not present

## 2021-05-21 DIAGNOSIS — M8458XA Pathological fracture in neoplastic disease, other specified site, initial encounter for fracture: Secondary | ICD-10-CM | POA: Diagnosis not present

## 2021-05-21 DIAGNOSIS — I2699 Other pulmonary embolism without acute cor pulmonale: Secondary | ICD-10-CM | POA: Diagnosis not present

## 2021-05-21 DIAGNOSIS — K5909 Other constipation: Secondary | ICD-10-CM

## 2021-05-21 DIAGNOSIS — C249 Malignant neoplasm of biliary tract, unspecified: Secondary | ICD-10-CM

## 2021-05-21 DIAGNOSIS — Z87891 Personal history of nicotine dependence: Secondary | ICD-10-CM | POA: Diagnosis not present

## 2021-05-21 DIAGNOSIS — I700 Atherosclerosis of aorta: Secondary | ICD-10-CM | POA: Diagnosis not present

## 2021-05-21 DIAGNOSIS — R7989 Other specified abnormal findings of blood chemistry: Secondary | ICD-10-CM

## 2021-05-21 DIAGNOSIS — Z7189 Other specified counseling: Secondary | ICD-10-CM

## 2021-05-21 DIAGNOSIS — N400 Enlarged prostate without lower urinary tract symptoms: Secondary | ICD-10-CM | POA: Diagnosis not present

## 2021-05-21 DIAGNOSIS — Z79899 Other long term (current) drug therapy: Secondary | ICD-10-CM | POA: Insufficient documentation

## 2021-05-21 DIAGNOSIS — K769 Liver disease, unspecified: Secondary | ICD-10-CM | POA: Diagnosis not present

## 2021-05-21 DIAGNOSIS — Z86711 Personal history of pulmonary embolism: Secondary | ICD-10-CM | POA: Diagnosis not present

## 2021-05-21 DIAGNOSIS — Z7952 Long term (current) use of systemic steroids: Secondary | ICD-10-CM | POA: Diagnosis not present

## 2021-05-21 DIAGNOSIS — Z510 Encounter for antineoplastic radiation therapy: Secondary | ICD-10-CM | POA: Diagnosis present

## 2021-05-21 LAB — CBC WITH DIFFERENTIAL (CANCER CENTER ONLY)
Abs Immature Granulocytes: 0.18 10*3/uL — ABNORMAL HIGH (ref 0.00–0.07)
Basophils Absolute: 0 10*3/uL (ref 0.0–0.1)
Basophils Relative: 0 %
Eosinophils Absolute: 0 10*3/uL (ref 0.0–0.5)
Eosinophils Relative: 0 %
HCT: 45.2 % (ref 39.0–52.0)
Hemoglobin: 15.5 g/dL (ref 13.0–17.0)
Immature Granulocytes: 1 %
Lymphocytes Relative: 7 %
Lymphs Abs: 1.1 10*3/uL (ref 0.7–4.0)
MCH: 31.7 pg (ref 26.0–34.0)
MCHC: 34.3 g/dL (ref 30.0–36.0)
MCV: 92.4 fL (ref 80.0–100.0)
Monocytes Absolute: 1.4 10*3/uL — ABNORMAL HIGH (ref 0.1–1.0)
Monocytes Relative: 8 %
Neutro Abs: 14.2 10*3/uL — ABNORMAL HIGH (ref 1.7–7.7)
Neutrophils Relative %: 84 %
Platelet Count: 247 10*3/uL (ref 150–400)
RBC: 4.89 MIL/uL (ref 4.22–5.81)
RDW: 15.7 % — ABNORMAL HIGH (ref 11.5–15.5)
WBC Count: 16.9 10*3/uL — ABNORMAL HIGH (ref 4.0–10.5)
nRBC: 0 % (ref 0.0–0.2)

## 2021-05-21 LAB — CMP (CANCER CENTER ONLY)
ALT: 236 U/L — ABNORMAL HIGH (ref 0–44)
AST: 110 U/L — ABNORMAL HIGH (ref 15–41)
Albumin: 3.5 g/dL (ref 3.5–5.0)
Alkaline Phosphatase: 579 U/L — ABNORMAL HIGH (ref 38–126)
Anion gap: 9 (ref 5–15)
BUN: 22 mg/dL (ref 8–23)
CO2: 27 mmol/L (ref 22–32)
Calcium: 9.4 mg/dL (ref 8.9–10.3)
Chloride: 105 mmol/L (ref 98–111)
Creatinine: 0.84 mg/dL (ref 0.61–1.24)
GFR, Estimated: >60 mL/min (ref >60)
Glucose, Bld: 110 mg/dL — ABNORMAL HIGH (ref 70–99)
Potassium: 5.1 mmol/L (ref 3.5–5.1)
Sodium: 141 mmol/L (ref 135–145)
Total Bilirubin: 0.4 mg/dL (ref 0.3–1.2)
Total Protein: 7.1 g/dL (ref 6.5–8.1)

## 2021-05-21 LAB — MAGNESIUM: Magnesium: 2.5 mg/dL — ABNORMAL HIGH (ref 1.7–2.4)

## 2021-05-21 MED ORDER — PROCHLORPERAZINE MALEATE 10 MG PO TABS
10.0000 mg | ORAL_TABLET | Freq: Four times a day (QID) | ORAL | 1 refills | Status: DC | PRN
  Filled 2021-05-21: qty 30, 8d supply, fill #0

## 2021-05-21 MED ORDER — ONDANSETRON HCL 8 MG PO TABS
8.0000 mg | ORAL_TABLET | Freq: Three times a day (TID) | ORAL | 1 refills | Status: DC | PRN
  Filled 2021-05-21: qty 30, 10d supply, fill #0

## 2021-05-21 MED ORDER — DEXAMETHASONE 4 MG PO TABS: 4.0000 mg | ORAL_TABLET | Freq: Two times a day (BID) | ORAL | 0 refills | Status: AC

## 2021-05-21 MED ORDER — LIDOCAINE-PRILOCAINE 2.5-2.5 % EX CREA
TOPICAL_CREAM | CUTANEOUS | 3 refills | Status: DC
Start: 2021-05-21 — End: 2021-10-16
  Filled 2021-05-21: qty 30, 25d supply, fill #0

## 2021-05-21 NOTE — Progress Notes (Signed)
START OFF PATHWAY REGIMEN - Other   [id number]:Cisplatin 25 mg/m2 D1,8 + Gemcitabine 1,000 mg/m2 D1,8 q21 Days:   A cycle is every 21 days:     Gemcitabine      Cisplatin   **Always confirm dose/schedule in your pharmacy ordering system**  **Administration Notes: cisplatin, gemcitabine and durvalumab  Patient Characteristics: Intent of Therapy: Non-Curative / Palliative Intent, Discussed with Patient

## 2021-05-21 NOTE — Assessment & Plan Note (Signed)
We discussed importance of aggressive laxative therapy 

## 2021-05-21 NOTE — Assessment & Plan Note (Signed)
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 The TOPAZ-1 trial evaluated durvalumab plus chemotherapy for patients with advanced biliary tract cancer.  METHODS In this double-blind, placebo-controlled, phase 3 study, we randomly assigned patients with previously untreated unresectable or metastatic biliary tract cancer or with recurrent disease 1:1 to receive durvalumab or placebo in combination with gemcitabine plus cisplatin for up to eight cycles, followed by durvalumab or placebo monotherapy until disease progression or unacceptable toxicity. The primary objective was to assess overall survival. Secondary end points included progression-free survival, objective response rate, and safety.  RESULTS Overall, 685 patients were randomly assigned to durvalumab (n=341) or placebo (n=344) with chemotherapy. As of data cutoff, 198 patients (58.1%) in the durvalumab group and 226 patients (65.7%) in the placebo group had died. The hazard ratio for overall survival was 0.80 (95% confidence interval [CI], 0.66 to 0.97; P=0.021). The estimated 62-monthoverall survival rate was 24.9% (95% CI, 17.9 to 32.5) for durvalumab and 10.4% (95% CI, 4.7 to 18.8) for placebo. The hazard ratio for  progression-free survival was 0.75 (95% CI, 0.63 to 0.89; P=0.001). Objective response rates were 26.7% with durvalumab and 18.7% with placebo. The incidences of grade 3 or 4 adverse events were 75.7% and 77.8% with durvalumab and placebo, respectively.  CONCLUSIONS Durvalumab plus chemotherapy significantly improved overall survival versus placebo plus chemotherapy and showed improvements versus placebo plus chemotherapy in prespecified secondary end points including progression-free survival and objective response rate. The safety profiles of the two treatment groups were similar. (Funded by [employer] CP[employer]ney Bowesgov number, [id number])  We discussed the role of chemotherapy. The intent is strictly palliative  We discussed some of the risks, benefits, side-effects of cisplatin, gemcitabine and Durvalumab.  Some of the short term side-effects included, though not limited to, including weight loss, life threatening infections, risk of allergic reactions, need for transfusions of blood products, nausea, vomiting, change in bowel habits, loss of hair, admission to hospital for various reasons, and risks of death.   Long term side-effects are also discussed including risks of infertility, permanent damage to nerve function, abnormal thyroid function, hearing loss, chronic fatigue, kidney damage with possibility needing hemodialysis, and rare secondary malignancy including bone marrow disorders.  The patient is aware that the response rates discussed earlier is not guaranteed.  After a long discussion, patient made an informed decision to proceed with the prescribed plan of care.   He will attend chemo education class I will get his labs drawn today in anticipation for him to start treatment next week

## 2021-05-21 NOTE — Assessment & Plan Note (Signed)
He will continue anticoagulation therapy indefinitely 

## 2021-05-21 NOTE — Assessment & Plan Note (Signed)
His elevated liver enzymes are due to disease Observe closely Since his bilirubin is within normal range, no dose adjustment is required

## 2021-05-21 NOTE — Assessment & Plan Note (Signed)
His bone pain is well controlled with dexamethasone and oxycodone I plan to reduce dexamethasone to twice daily He can take acetaminophen and oxycodone as needed I will refer him to radiation oncologist for palliative radiation therapy

## 2021-05-21 NOTE — Assessment & Plan Note (Signed)
He is aware that his disease is incurable but treatable at this point We discussed prognosis with or without chemotherapy I do not advise him to return back to work due to the terminal nature of his disease and high risk of infection and I gave him a letter to bring to his workplace I recommend the patient to apply for permanent disability

## 2021-05-22 ENCOUNTER — Telehealth: Payer: Self-pay | Admitting: Hematology and Oncology

## 2021-05-22 NOTE — Telephone Encounter (Signed)
Scheduled appts per secure chat with RN Threasa Beards and Jenetta Downer. Pt's son is aware of appts.

## 2021-05-22 NOTE — Progress Notes (Signed)
Pharmacist Chemotherapy Monitoring - Initial Assessment    Anticipated start date: 05/29/21   The following has been reviewed per standard work regarding the patient's treatment regimen: The patient's diagnosis, treatment plan and drug doses, and organ/hematologic function Lab orders and baseline tests specific to treatment regimen  The treatment plan start date, drug sequencing, and pre-medications Prior authorization status  Patient's documented medication list, including drug-drug interaction screen and prescriptions for anti-emetics and supportive care specific to the treatment regimen The drug concentrations, fluid compatibility, administration routes, and timing of the medications to be used The patient's access for treatment and lifetime cumulative dose history, if applicable  The patient's medication allergies and previous infusion related reactions, if applicable   Changes made to treatment plan:  Added hypersensitivity meds to each Day 1; updated Dr. Alvy Bimler.  Follow up needed:  Pending authorization for treatment  Pt has [employer]; may require G-CSF self-admin/specialty Rx.  If so, Day 3 will need to be removed from tx plan.  I updated Dr. Alvy Bimler; will adjust plan based on PA determination.    Kennith Center, Pharm.D., CPP 05/22/2021'@3'$ :19 PM

## 2021-05-25 ENCOUNTER — Other Ambulatory Visit: Payer: Self-pay | Admitting: Hematology and Oncology

## 2021-05-25 NOTE — Progress Notes (Signed)
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  Sites of Visceral and Bony Metastatic Disease: T4  MRI Cervical/Lumbar/ Thoracic Spine 05/14/2021: Pathologic split type fracture of T4 with approximately 25% height loss, likely due to metastatic disease. 2. No other evidence of metastatic disease of the cervical, thoracic or lumbar spine.  Moderate left L5-S1 neural foraminal stenosis.  Left-greater-than-right lateral recess narrowing at L4-5 and L5-S1.  CT AP 05/14/2021: 5 cm ill-defined irregular low-density lesion in the anterior right liver inferiorly is highly suspicious for primary or metastatic neoplasm. The lesion obliterates bile ducts in the central right liver resulting in right hepatic lobe biliary dilatation.  Gallbladder wall is contracted and irregular in appearance adjacent to the lesion. Imaging features suggest that gallbladder changes are secondary although primary gallbladder neoplasm not excluded.  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 No evidence for additional metastatic disease in the abdomen or pelvis.  CTA Chest 05/13/2021: Lytic lesion of the T4 vertebral body, compatible with osseous metastatic disease. Possible soft tissue extension into the left neural foramen.  Biopsy of Liver 05/15/2021   Past/Anticipated chemotherapy by medical oncology, if any:  Dr. 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 The intent is strictly palliative -His bone pain is well controlled with dexamethasone and oxycodone -I plan to reduce dexamethasone to twice daily -He can take acetaminophen and oxycodone as needed -I will refer him to radiation oncologist for palliative radiation  therapy -I do not advise him to return back to work due to the terminal nature of his disease and high risk of infection and I gave him a letter to bring to his workplace -I recommend the patient to apply for permanent disability -Chemotherapy start 05/29/2021   Pain on a scale of 0-10 is:  Mild pain in his left shoulder.   Ambulatory status? Walker? Wheelchair?: Ambulatory  SAFETY ISSUES: Prior radiation? No Pacemaker/ICD? No Possible current pregnancy? N/a Is the patient on methotrexate? No  Current Complaints / other details:   Port inserted 05/19/2021

## 2021-05-26 ENCOUNTER — Encounter: Payer: Self-pay | Admitting: *Deleted

## 2021-05-26 NOTE — Progress Notes (Signed)
[place] [employer] representative received phone call from patients son requesting assistance with insurance and disability.  Due to patients recent diagnosis he will no longer be able to continue working and is concerned about losing insurance.  CSW explained that because patient was over 59 he was eligible for social security and medicare, and would not meet the social security disability requirements.  CSW provided patients son with information on social security and applying for medicare benefits.  CSW also provided contact information for [employer] provided through [employer].  CSW encouraged patients son to contact [facility] for assistance with applying for Medicare and Social Security Benefits.  CSW will share information with CSW team who will be following patient.  Patients son requested calls be made to him or his brother.   [address] [id number] [place] [phone]  Johnnye Lana, MSW, LCSW, OSW-C Clinical Social Worker [facility] [phone]

## 2021-05-27 ENCOUNTER — Other Ambulatory Visit: Payer: Self-pay | Admitting: Radiation Therapy

## 2021-05-27 ENCOUNTER — Other Ambulatory Visit: Payer: Self-pay

## 2021-05-27 ENCOUNTER — Telehealth: Payer: Self-pay | Admitting: *Deleted

## 2021-05-27 ENCOUNTER — Inpatient Hospital Stay: Payer: Managed Care, Other (non HMO) | Admitting: *Deleted

## 2021-05-27 ENCOUNTER — Inpatient Hospital Stay: Payer: Managed Care, Other (non HMO)

## 2021-05-27 ENCOUNTER — Ambulatory Visit
Admission: RE | Admit: 2021-05-27 | Discharge: 2021-05-27 | Disposition: A | Discharge status: Home / Self Care | Payer: Managed Care, Other (non HMO) | Source: Ambulatory Visit | Attending: Radiation Oncology | Admitting: Radiation Oncology

## 2021-05-27 ENCOUNTER — Encounter: Payer: Self-pay | Admitting: Radiation Oncology

## 2021-05-27 VITALS — BP 134/83 | HR 66 | Temp 97.5°F | Resp 18 | Ht 62.0 in | Wt 141.0 lb

## 2021-05-27 DIAGNOSIS — C7951 Secondary malignant neoplasm of bone: Secondary | ICD-10-CM

## 2021-05-27 DIAGNOSIS — M8458XA Pathological fracture in neoplastic disease, other specified site, initial encounter for fracture: Secondary | ICD-10-CM | POA: Insufficient documentation

## 2021-05-27 DIAGNOSIS — N400 Enlarged prostate without lower urinary tract symptoms: Secondary | ICD-10-CM | POA: Insufficient documentation

## 2021-05-27 DIAGNOSIS — K769 Liver disease, unspecified: Secondary | ICD-10-CM | POA: Insufficient documentation

## 2021-05-27 DIAGNOSIS — C249 Malignant neoplasm of biliary tract, unspecified: Secondary | ICD-10-CM

## 2021-05-27 DIAGNOSIS — Z87891 Personal history of nicotine dependence: Secondary | ICD-10-CM | POA: Insufficient documentation

## 2021-05-27 DIAGNOSIS — Z7952 Long term (current) use of systemic steroids: Secondary | ICD-10-CM | POA: Insufficient documentation

## 2021-05-27 DIAGNOSIS — I700 Atherosclerosis of aorta: Secondary | ICD-10-CM | POA: Insufficient documentation

## 2021-05-27 DIAGNOSIS — Z7901 Long term (current) use of anticoagulants: Secondary | ICD-10-CM | POA: Insufficient documentation

## 2021-05-27 DIAGNOSIS — I2699 Other pulmonary embolism without acute cor pulmonale: Secondary | ICD-10-CM

## 2021-05-27 DIAGNOSIS — I2693 Single subsegmental pulmonary embolism without acute cor pulmonale: Secondary | ICD-10-CM | POA: Insufficient documentation

## 2021-05-27 DIAGNOSIS — K5909 Other constipation: Secondary | ICD-10-CM

## 2021-05-27 DIAGNOSIS — Z5112 Encounter for antineoplastic immunotherapy: Secondary | ICD-10-CM | POA: Diagnosis not present

## 2021-05-27 LAB — CMP (CANCER CENTER ONLY)
ALT: 305 U/L (ref 0–44)
AST: 92 U/L — ABNORMAL HIGH (ref 15–41)
Albumin: 3.1 g/dL — ABNORMAL LOW (ref 3.5–5.0)
Alkaline Phosphatase: 548 U/L — ABNORMAL HIGH (ref 38–126)
Anion gap: 8 (ref 5–15)
BUN: 21 mg/dL (ref 8–23)
CO2: 28 mmol/L (ref 22–32)
Calcium: 9.3 mg/dL (ref 8.9–10.3)
Chloride: 105 mmol/L (ref 98–111)
Creatinine: 0.82 mg/dL (ref 0.61–1.24)
GFR, Estimated: >60 mL/min (ref >60)
Glucose, Bld: 98 mg/dL (ref 70–99)
Potassium: 4.5 mmol/L (ref 3.5–5.1)
Sodium: 141 mmol/L (ref 135–145)
Total Bilirubin: 0.4 mg/dL (ref 0.3–1.2)
Total Protein: 6.6 g/dL (ref 6.5–8.1)

## 2021-05-27 LAB — MAGNESIUM: Magnesium: 2.3 mg/dL (ref 1.7–2.4)

## 2021-05-27 LAB — CBC WITH DIFFERENTIAL (CANCER CENTER ONLY)
Abs Immature Granulocytes: 0.22 10*3/uL — ABNORMAL HIGH (ref 0.00–0.07)
Basophils Absolute: 0 10*3/uL (ref 0.0–0.1)
Basophils Relative: 0 %
Eosinophils Absolute: 0 10*3/uL (ref 0.0–0.5)
Eosinophils Relative: 0 %
HCT: 44.1 % (ref 39.0–52.0)
Hemoglobin: 15.2 g/dL (ref 13.0–17.0)
Immature Granulocytes: 1 %
Lymphocytes Relative: 6 %
Lymphs Abs: 1 10*3/uL (ref 0.7–4.0)
MCH: 32.1 pg (ref 26.0–34.0)
MCHC: 34.5 g/dL (ref 30.0–36.0)
MCV: 93 fL (ref 80.0–100.0)
Monocytes Absolute: 1.4 10*3/uL — ABNORMAL HIGH (ref 0.1–1.0)
Monocytes Relative: 8 %
Neutro Abs: 14.7 10*3/uL — ABNORMAL HIGH (ref 1.7–7.7)
Neutrophils Relative %: 85 %
Platelet Count: 249 10*3/uL (ref 150–400)
RBC: 4.74 MIL/uL (ref 4.22–5.81)
RDW: 16.2 % — ABNORMAL HIGH (ref 11.5–15.5)
WBC Count: 17.4 10*3/uL — ABNORMAL HIGH (ref 4.0–10.5)
nRBC: 0 % (ref 0.0–0.2)

## 2021-05-27 LAB — TSH: TSH: 0.846 u[IU]/mL (ref 0.320–4.118)

## 2021-05-27 LAB — T4, FREE: Free T4: 0.78 ng/dL (ref 0.61–1.12)

## 2021-05-27 NOTE — Progress Notes (Addendum)
Radiation Oncology         [phone]) [phone] ________________________________  Name: Jason Lyons        MRN: [id number]  Date of Service: 05/27/2021 DOB: April 15, 1956  CC:Pcp, No  Heath Lark, MD     REFERRING PHYSICIAN: Heath Lark, MD   DIAGNOSIS: The primary encounter diagnosis was 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[facility]). Diagnoses of Cancer of biliary tract ([place]) and Pathological fracture of vertebra due to neoplastic disease, initial encounter were also pertinent to this visit.   HISTORY OF PRESENT ILLNESS: Jason Lyons is a 65 y.o. male seen at the request of Dr. 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 Trace free fluid and known T4 lesion was seen. An ultrasound biopsy of the liver on 05/15/21 showed adenocarcinoma consistent with pancreatobiliary primary. Repeat CTPA on 05/16/21 confirmed the PE seen on prior imaging and an MRI brain on 05/16/21 showed no metastatic disease. Given his T4 disease, he's seen to discuss options of palliative radiotherapy. He will begin chemotherapy on 05/29/21.   PREVIOUS RADIATION THERAPY: No   PAST MEDICAL HISTORY: History reviewed. No pertinent past medical history.     PAST SURGICAL HISTORY: Past Surgical History:  Procedure Laterality Date   IR IMAGING GUIDED PORT INSERTION  05/19/2021     FAMILY HISTORY:  Family History  Problem Relation Age of Onset   Cancer Father        throat cancer     SOCIAL HISTORY:  reports that he quit smoking about 2 weeks ago. His  smoking use included cigarettes. He has a 20.00 pack-year smoking history. He has been exposed to tobacco smoke. He has never used smokeless tobacco. He reports current alcohol use of about 1.0 - 2.0 standard drink per week. He reports that he does not use drugs. The patient is married and lives in [place]. He will be planning to enroll in disability. He is accompanied by his son Jason Lyons.    ALLERGIES: Patient has no known allergies.   MEDICATIONS:  Current Outpatient Medications  Medication Sig Dispense Refill   apixaban (ELIQUIS) 5 MG TABS tablet Take 1 tablet (5 mg total) by mouth 2 (two) times daily. 60 tablet 1   dexamethasone (DECADRON) 4 MG tablet Take 1 tablet (4 mg total) by mouth 2 (two) times daily for 7 days. 21 tablet 0   lidocaine-prilocaine (EMLA) cream Apply to affected area once 30 g 3   ondansetron (ZOFRAN ODT) 4 MG disintegrating tablet Take 1 tablet (4 mg total) by mouth every 8 (eight) hours as needed for nausea or vomiting. 20 tablet 0   ondansetron (ZOFRAN) 8 MG tablet Take 1 tablet by mouth every 8 hours as needed. 30 tablet 1   prochlorperazine (COMPAZINE) 10 MG tablet Take 1 tablet by mouth every 6 hours as needed (Nausea or vomiting). 30 tablet 1   senna-docusate (SENOKOT-S) 8.6-50 MG tablet Take 1 tablet by mouth 2 (two) times daily as needed for mild constipation or moderate constipation.     No current facility-administered medications for  this encounter.     REVIEW OF SYSTEMS: On review of systems, the patient reports that he has had pain that radiates on the left side from his upper back around at the level of the breast. He denies any numbness or tingling but describes constant pain in the mid back that is worsened at night. He denies loss of sensory function in his trunk or extremities. He denies any difficulty with swallowing or neck pain. No other complaints are noted.      PHYSICAL EXAM:  Wt Readings from Last 3 Encounters:  05/27/21 141 lb (64 kg)   05/21/21 135 lb 12.8 oz (61.6 kg)  05/13/21 135 lb (61.2 kg)   Temp Readings from Last 3 Encounters:  05/27/21 (!) 97.5 F (36.4 C) (Temporal)  05/21/21 98.1 F (36.7 C) (Tympanic)  05/19/21 97.9 F (36.6 C) (Oral)   BP Readings from Last 3 Encounters:  05/27/21 134/83  05/21/21 133/75  05/19/21 125/79   Pulse Readings from Last 3 Encounters:  05/27/21 66  05/21/21 (!) 57  05/19/21 (!) 56   Pain Assessment Pain Score: 3  Pain Loc: Shoulder (Left Shoulder)/10 In general this is a well appearing Asian male in no acute distress. He's alert and oriented x4 and appropriate throughout the examination. Cardiopulmonary assessment is negative for acute distress and he exhibits normal effort.    ECOG = 1  0 - Asymptomatic (Fully active, able to carry on all predisease activities without restriction)  1 - Symptomatic but completely ambulatory (Restricted in physically strenuous activity but ambulatory and able to carry out work of a light or sedentary nature. For example, light housework, office work)  2 - Symptomatic, <50% in bed during the day (Ambulatory and capable of all self care but unable to carry out any work activities. Up and about more than 50% of waking hours)  3 - Symptomatic, >50% in bed, but not bedbound (Capable of only limited self-care, confined to bed or chair 50% or more of waking hours)  4 - Bedbound (Completely disabled. Cannot carry on any self-care. Totally confined to bed or chair)  5 - Death   Eustace Pen MM, Creech RH, Tormey DC, et al. [id number]). "Toxicity and response criteria of the [facility]". [place] Oncol. 5 (6): 649-55    LABORATORY DATA:  Lab Results  Component Value Date   WBC 16.9 (H) 05/21/2021   HGB 15.5 05/21/2021   HCT 45.2 05/21/2021   MCV 92.4 05/21/2021   PLT 247 05/21/2021   Lab Results  Component Value Date   NA 141 05/21/2021   K 5.1 05/21/2021   CL 105 05/21/2021   CO2 27 05/21/2021   Lab Results   Component Value Date   ALT 236 (H) 05/21/2021   AST 110 (H) 05/21/2021   GGT 687 (H) 05/15/2021   ALKPHOS 579 (H) 05/21/2021   BILITOT 0.4 05/21/2021      RADIOGRAPHY: CT ABDOMEN PELVIS W WO CONTRAST  Result Date: 05/15/2021 CLINICAL DATA:  Masslike area identified in the liver on recent CTA Chest. EXAM: CT ABDOMEN AND PELVIS WITHOUT AND WITH CONTRAST TECHNIQUE: Multidetector CT imaging of the abdomen and pelvis was performed following the standard protocol before and following the bolus administration of intravenous contrast. CONTRAST:  121m OMNIPAQUE IOHEXOL 350 MG/ML SOLN COMPARISON:  CTA Chest 05/13/2021 FINDINGS: Lower chest: Dependent atelectasis in both lower lobes. Hepatobiliary: 5.0 x 3.3 x 4.5 cm ill-defined irregular low-density lesion is identified in the anterior right liver inferiorly.  Arterial phase imaging shows subtle irregular peripheral enhancement with heterogeneous enhancement in the lesion inferiorly. Lesion takes on a more infiltrative appearance at the inferior tip of the liver extending more posteriorly. The mass tracks along the gallbladder fossa towards the fundus in the gallbladder is contracted and irregular in appearance at this location. Focal biliary dilatation is seen in the anterior right liver (segment VIII) into a lesser degree in the posterior right liver (segments VI and VII). There is abrupt cut off of the dilated bile ducts along the medial aspect of the right hepatic lobe lesion. Similarly, portal venous anatomy appears obliterated centrally in the anterior right liver along the superomedial margin of the lesion. As above, gallbladder appears irregular with diffuse wall thickening. No extrahepatic biliary duct dilatation. Pancreas: No focal mass lesion. No dilatation of the main duct. No intraparenchymal cyst. No peripancreatic edema. Spleen: No splenomegaly. No focal mass lesion. Adrenals/Urinary Tract: No adrenal nodule or mass. Kidneys unremarkable. No  evidence for hydroureter. The urinary bladder appears normal for the degree of distention. Stomach/Bowel: Stomach is unremarkable. No gastric wall thickening. No evidence of outlet obstruction. Duodenum is normally positioned as is the ligament of Treitz. No small bowel wall thickening. No small bowel dilatation. The terminal ileum is normal. The appendix is normal. No gross colonic mass. No colonic wall thickening. Vascular/Lymphatic: Insert calcium aorta upper normal lymph nodes are seen in the hepatoduodenal ligament. No retroperitoneal lymphadenopathy No pelvic sidewall lymphadenopathy. Reproductive: Prostate gland is enlarged with heterogeneous enhancement and dystrophic calcification. Other: Trace free fluid is noted in the pelvis. Musculoskeletal: No worrisome lytic or sclerotic osseous abnormality. IMPRESSION: 1. 5 cm ill-defined irregular low-density lesion in the anterior right liver inferiorly is highly suspicious for primary or metastatic neoplasm. The lesion obliterates bile ducts in the central right liver resulting in right hepatic lobe biliary dilatation. Similarly, portal venous anatomy in the anterior right liver is involved along the superomedial margin of the lesion. Lesion should be amenable to tissue sampling. 2. Gallbladder wall is contracted and irregular in appearance adjacent to the lesion. Imaging features suggest that gallbladder changes are secondary although primary gallbladder neoplasm not excluded. 3. 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 No evidence for additional metastatic disease in the abdomen or pelvis. 4. Prostatomegaly. 5. Trace free fluid in the pelvis. 6. Aortic Atherosclerosis (ICD10-I70.0). Electronically Signed   By: Misty Stanley M.D.   On: 05/15/2021 05:53   DG Chest 2 View  Result Date: 05/13/2021 CLINICAL DATA:  Chest pain. EXAM: CHEST - 2 VIEW COMPARISON:  No prior. FINDINGS: Mediastinum and hilar structures normal. Low lung volumes with mild  bibasilar atelectasis. No pleural effusion or pneumothorax. Heart size normal. Mild thoracic spine scoliosis. No acute bony abnormality. IMPRESSION: Low lung volumes with mild bibasilar atelectasis. Electronically Signed   By: Marcello Moores  Register   On: 05/13/2021 12:04   CT Angio Chest Pulmonary Embolism (PE) W or WO Contrast  Result Date: 05/16/2021 CLINICAL DATA:  65 year old male with clinical concern for pulmonary embolism. Follow-up study. EXAM: CT ANGIOGRAPHY CHEST WITH CONTRAST TECHNIQUE: Multidetector CT imaging of the chest was performed using the standard protocol during bolus administration of intravenous contrast. Multiplanar CT image reconstructions and MIPs were obtained to evaluate the vascular anatomy. CONTRAST:  52m OMNIPAQUE IOHEXOL 350 MG/ML SOLN COMPARISON:  Chest CTA 05/13/2021. FINDINGS: Cardiovascular: Again noted are small subsegmental sized filling defects in pulmonary artery branches in the right lower lobe best appreciated on axial images 169-178 of series 5. No  other central, lobar or segmental sized filling defects are noted elsewhere in the pulmonary arterial tree. Heart size is normal. There is no significant pericardial fluid, thickening or pericardial calcification. There is aortic atherosclerosis, as well as atherosclerosis of the great vessels of the mediastinum and the coronary arteries, including calcified atherosclerotic plaque in the left main, left anterior descending and left circumflex coronary arteries. Ectasia of ascending thoracic aorta (4.0 cm in diameter). Mediastinum/Nodes: No pathologically enlarged mediastinal or hilar lymph nodes. Esophagus is unremarkable in appearance. No axillary lymphadenopathy. Lungs/Pleura: Dependent areas of subsegmental atelectasis are noted in the lower lobes of the lungs bilaterally. No acute consolidative airspace disease. No pleural effusions. 3 mm pulmonary nodule in the apex of the left upper lobe (axial image 28 of series 6). No  other larger more suspicious appearing pulmonary nodules or masses are noted. Upper Abdomen: Please see dictation for CT of the abdomen and pelvis 05/14/2021 for full description of findings below the diaphragm, which includes a large liver mass. Musculoskeletal: Lytic lesion in L4 with pathologic fracture resulting in 25% loss of posterior vertebral body height and 4 mm of retropulsion of fracture fragments impinging upon the central spinal canal. Review of the MIP images confirms the above findings. IMPRESSION: 1. Study is once again positive for subsegmental sized emboli in the right lower lobe. No larger central, lobar or segmental sized emboli are noted. 2. Lytic lesion in T4 with pathologic fracture, similar to the prior study. This was better depicted on recent thoracic spine MRI. 3. Aortic atherosclerosis, in addition to left main and 3 vessel coronary artery disease. There is also mild ectasia of the ascending thoracic aorta (4.0 cm in diameter). Recommend annual imaging followup by CTA or MRA. This recommendation follows 2010 ACCF/AHA/AATS/ACR/ASA/SCA/SCAI/SIR/STS/SVM Guidelines for the Diagnosis and Management of Patients with Thoracic Aortic Disease. Circulation. 2010; [id number]. Aortic aneurysm NOS (ICD10-I71.9). Aortic Atherosclerosis (ICD10-I70.0). Electronically Signed   By: Vinnie Langton M.D.   On: 05/16/2021 10:24   CT Angio Chest PE W and/or Wo Contrast  Addendum Date: 05/15/2021   ADDENDUM REPORT: 05/15/2021 10:12 ADDENDUM: Case was reviewed with multiple radiologists, subsegmental PE in the right lower lobe is possible, however conclusive evaluation is limited due to respiratory motion artifact. If the presence or absence of subsegmental pulmonary embolus would change management, repeat chest CTA could be considered. Electronically Signed   By: Yetta Glassman MD   On: 05/15/2021 10:12   Result Date: 05/15/2021 CLINICAL DATA:  Shortness of breath EXAM: CT ANGIOGRAPHY CHEST WITH  CONTRAST TECHNIQUE: Multidetector CT imaging of the chest was performed using the standard protocol during bolus administration of intravenous contrast. Multiplanar CT image reconstructions and MIPs were obtained to evaluate the vascular anatomy. CONTRAST:  14m OMNIPAQUE IOHEXOL 350 MG/ML SOLN COMPARISON:  None. FINDINGS: Cardiovascular: Adequate contrast opacification of the pulmonary arteries. Subsegmental pulmonary embolus seen in the right lower lobe pulmonary arteries. There is normal heart size. No pericardial effusion. 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 Mediastinum/Nodes: No enlarged mediastinal, hilar, or axillary lymph nodes. Thyroid gland, trachea, and esophagus demonstrate no significant findings. Lungs/Pleura: Lungs are clear. No pleural effusion or pneumothorax. No Upper Abdomen: Ill-defined masslike area seen in the right lobe of the liver measuring approximately 6.2 x 5.2 cm with adjacent areas of linear low density. Musculoskeletal: Lytic lesion of the T4 vertebral body with possible soft tissue extension into the left neural foramen. No evidence of  retropulsion Review of the MIP images confirms the above findings. IMPRESSION: Positive for subsegmental pulmonary embolus in the right lower lobe. Ill-defined masslike area seen in the right lobe of the liver with adjacent intrahepatic biliary ductal dilation. 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 Possible soft tissue extension into the left neural foramen. Finding could be further evaluated with contrast enhanced MRI of the spine. Critical Value/emergent results were called by telephone at the time of interpretation on 05/13/2021 at 2:00 pm to provider [facility] , who verbally acknowledged these results. Electronically Signed: By: Yetta Glassman MD On: 05/13/2021 14:01   MR BRAIN W WO CONTRAST  Result Date:  05/16/2021 CLINICAL DATA:  Cancer of unknown primary, staging metastatic cancer. EXAM: MRI HEAD WITHOUT AND WITH CONTRAST TECHNIQUE: Multiplanar, multiecho pulse sequences of the brain and surrounding structures were obtained without and with intravenous contrast. CONTRAST:  71m GADAVIST GADOBUTROL 1 MMOL/ML IV SOLN COMPARISON:  None. FINDINGS: Brain: There is no evidence of an acute infarct, intracranial hemorrhage, mass, midline shift, or extra-axial fluid collection. Mild cerebral atrophy is within normal limits for age. No significant white matter disease is seen. No abnormal enhancement is identified. Vascular: Major intracranial vascular flow voids are preserved. Skull and upper cervical spine: Unremarkable bone marrow signal. Sinuses/Orbits: Unremarkable orbits. Paranasal sinuses and mastoid air cells are clear. Other: None. IMPRESSION: Unremarkable appearance of the brain for age. No evidence of intracranial metastases. Electronically Signed   By: ALogan BoresM.D.   On: 05/16/2021 17:38   MR CERVICAL SPINE W WO CONTRAST  Result Date: 05/14/2021 CLINICAL DATA:  Metastatic disease evaluation EXAM: MRI CERVICAL, THORACIC AND LUMBAR SPINE WITHOUT AND WITH CONTRAST TECHNIQUE: Multiplanar and multiecho pulse sequences of the cervical spine, to include the craniocervical junction and cervicothoracic junction, and thoracic and lumbar spine, were obtained without and with intravenous contrast. CONTRAST:  617mGADAVIST GADOBUTROL 1 MMOL/ML IV SOLN COMPARISON:  None. FINDINGS: MRI CERVICAL SPINE FINDINGS Alignment: Physiologic. Vertebrae: No fracture, evidence of discitis, or bone lesion. Cord: Normal signal and morphology. Posterior Fossa, vertebral arteries, paraspinal tissues: Negative. Disc levels: C1-2: Unremarkable C2-3: Small central disc protrusion. There is no spinal canal stenosis. No neural foraminal stenosis. C3-4: Normal disc space and facet joints. There is no spinal canal stenosis. No neural foraminal  stenosis. C4-5: Normal disc space and facet joints. There is no spinal canal stenosis. No neural foraminal stenosis. C5-6: Small central disc protrusion. There is no spinal canal stenosis. No neural foraminal stenosis. C6-7: Normal disc space and facet joints. There is no spinal canal stenosis. No neural foraminal stenosis. C7-T1: Normal disc space and facet joints. There is no spinal canal stenosis. No neural foraminal stenosis. MRI THORACIC SPINE FINDINGS Alignment:  Physiologic. Vertebrae: Diffusely abnormal bone marrow signal at T4 with split type fracture and expansion of the left pedicle. There is diffuse abnormal contrast enhancement. Approximately 25% height loss. Cord:  Normal signal and morphology. Paraspinal and other soft tissues: Negative. Disc levels: MRI LUMBAR SPINE FINDINGS Segmentation:  Standard. Alignment:  Physiologic. Vertebrae:  Hemangioma at L1.  No other osseous lesion. Conus medullaris and cauda equina: Conus extends to the L1 level. Conus and cauda equina appear normal. Paraspinal and other soft tissues: Negative Disc levels: L1-L2: Normal disc space and facet joints. No spinal canal stenosis. No neural foraminal stenosis. L2-L3: Normal disc space and facet joints. No spinal canal stenosis. No neural foraminal stenosis. L3-L4: Small disc bulge. No spinal canal stenosis. No neural  foraminal stenosis. L4-L5: Small disc bulge with superimposed central protrusion. Left-greater-than-right lateral recess narrowing without central spinal canal stenosis. No neural foraminal stenosis. L5-S1: Left asymmetric disc bulge. Left lateral recess narrowing without central spinal canal stenosis. Moderate left neural foraminal stenosis. Visualized sacrum: Normal. IMPRESSION: 1. Pathologic split type fracture of T4 with approximately 25% height loss, likely due to metastatic disease. 2. No other evidence of metastatic disease of the cervical, thoracic or lumbar spine. 3. Moderate left L5-S1 neural foraminal  stenosis. 4. Left-greater-than-right lateral recess narrowing at L4-5 and L5-S1. Electronically Signed   By: Ulyses Jarred M.D.   On: 05/14/2021 19:32   MR THORACIC SPINE W WO CONTRAST  Result Date: 05/14/2021 CLINICAL DATA:  Metastatic disease evaluation EXAM: MRI CERVICAL, THORACIC AND LUMBAR SPINE WITHOUT AND WITH CONTRAST TECHNIQUE: Multiplanar and multiecho pulse sequences of the cervical spine, to include the craniocervical junction and cervicothoracic junction, and thoracic and lumbar spine, were obtained without and with intravenous contrast. CONTRAST:  38m GADAVIST GADOBUTROL 1 MMOL/ML IV SOLN COMPARISON:  None. FINDINGS: MRI CERVICAL SPINE FINDINGS Alignment: Physiologic. Vertebrae: No fracture, evidence of discitis, or bone lesion. Cord: Normal signal and morphology. Posterior Fossa, vertebral arteries, paraspinal tissues: Negative. Disc levels: C1-2: Unremarkable C2-3: Small central disc protrusion. There is no spinal canal stenosis. No neural foraminal stenosis. C3-4: Normal disc space and facet joints. There is no spinal canal stenosis. No neural foraminal stenosis. C4-5: Normal disc space and facet joints. There is no spinal canal stenosis. No neural foraminal stenosis. C5-6: Small central disc protrusion. There is no spinal canal stenosis. No neural foraminal stenosis. C6-7: Normal disc space and facet joints. There is no spinal canal stenosis. No neural foraminal stenosis. C7-T1: Normal disc space and facet joints. There is no spinal canal stenosis. No neural foraminal stenosis. MRI THORACIC SPINE FINDINGS Alignment:  Physiologic. Vertebrae: Diffusely abnormal bone marrow signal at T4 with split type fracture and expansion of the left pedicle. There is diffuse abnormal contrast enhancement. Approximately 25% height loss. Cord:  Normal signal and morphology. Paraspinal and other soft tissues: Negative. Disc levels: MRI LUMBAR SPINE FINDINGS Segmentation:  Standard. Alignment:  Physiologic.  Vertebrae:  Hemangioma at L1.  No other osseous lesion. Conus medullaris and cauda equina: Conus extends to the L1 level. Conus and cauda equina appear normal. Paraspinal and other soft tissues: Negative Disc levels: L1-L2: Normal disc space and facet joints. No spinal canal stenosis. No neural foraminal stenosis. L2-L3: Normal disc space and facet joints. No spinal canal stenosis. No neural foraminal stenosis. L3-L4: Small disc bulge. No spinal canal stenosis. No neural foraminal stenosis. L4-L5: Small disc bulge with superimposed central protrusion. Left-greater-than-right lateral recess narrowing without central spinal canal stenosis. No neural foraminal stenosis. L5-S1: Left asymmetric disc bulge. Left lateral recess narrowing without central spinal canal stenosis. Moderate left neural foraminal stenosis. Visualized sacrum: Normal. IMPRESSION: 1. Pathologic split type fracture of T4 with approximately 25% height loss, likely due to metastatic disease. 2. No other evidence of metastatic disease of the cervical, thoracic or lumbar spine. 3. Moderate left L5-S1 neural foraminal stenosis. 4. Left-greater-than-right lateral recess narrowing at L4-5 and L5-S1. Electronically Signed   By: KUlyses JarredM.D.   On: 05/14/2021 19:32   MR Lumbar Spine W Wo Contrast  Result Date: 05/14/2021 CLINICAL DATA:  Metastatic disease evaluation EXAM: MRI CERVICAL, THORACIC AND LUMBAR SPINE WITHOUT AND WITH CONTRAST TECHNIQUE: Multiplanar and multiecho pulse sequences of the cervical spine, to include the craniocervical junction and cervicothoracic junction, and thoracic  and lumbar spine, were obtained without and with intravenous contrast. CONTRAST:  63m GADAVIST GADOBUTROL 1 MMOL/ML IV SOLN COMPARISON:  None. FINDINGS: MRI CERVICAL SPINE FINDINGS Alignment: Physiologic. Vertebrae: No fracture, evidence of discitis, or bone lesion. Cord: Normal signal and morphology. Posterior Fossa, vertebral arteries, paraspinal tissues:  Negative. Disc levels: C1-2: Unremarkable C2-3: Small central disc protrusion. There is no spinal canal stenosis. No neural foraminal stenosis. C3-4: Normal disc space and facet joints. There is no spinal canal stenosis. No neural foraminal stenosis. C4-5: Normal disc space and facet joints. There is no spinal canal stenosis. No neural foraminal stenosis. C5-6: Small central disc protrusion. There is no spinal canal stenosis. No neural foraminal stenosis. C6-7: Normal disc space and facet joints. There is no spinal canal stenosis. No neural foraminal stenosis. C7-T1: Normal disc space and facet joints. There is no spinal canal stenosis. No neural foraminal stenosis. MRI THORACIC SPINE FINDINGS Alignment:  Physiologic. Vertebrae: Diffusely abnormal bone marrow signal at T4 with split type fracture and expansion of the left pedicle. There is diffuse abnormal contrast enhancement. Approximately 25% height loss. Cord:  Normal signal and morphology. Paraspinal and other soft tissues: Negative. Disc levels: MRI LUMBAR SPINE FINDINGS Segmentation:  Standard. Alignment:  Physiologic. Vertebrae:  Hemangioma at L1.  No other osseous lesion. Conus medullaris and cauda equina: Conus extends to the L1 level. Conus and cauda equina appear normal. Paraspinal and other soft tissues: Negative Disc levels: L1-L2: Normal disc space and facet joints. No spinal canal stenosis. No neural foraminal stenosis. L2-L3: Normal disc space and facet joints. No spinal canal stenosis. No neural foraminal stenosis. L3-L4: Small disc bulge. No spinal canal stenosis. No neural foraminal stenosis. L4-L5: Small disc bulge with superimposed central protrusion. Left-greater-than-right lateral recess narrowing without central spinal canal stenosis. No neural foraminal stenosis. L5-S1: Left asymmetric disc bulge. Left lateral recess narrowing without central spinal canal stenosis. Moderate left neural foraminal stenosis. Visualized sacrum: Normal.  IMPRESSION: 1. Pathologic split type fracture of T4 with approximately 25% height loss, likely due to metastatic disease. 2. No other evidence of metastatic disease of the cervical, thoracic or lumbar spine. 3. Moderate left L5-S1 neural foraminal stenosis. 4. Left-greater-than-right lateral recess narrowing at L4-5 and L5-S1. Electronically Signed   By: KUlyses JarredM.D.   On: 05/14/2021 19:32   [place] (LIVER)  Result Date: 05/15/2021 INDICATION: 65year old with evidence of metastatic disease. Patient has a suspicious hepatic lesion and needs a tissue diagnosis. EXAM: ULTRASOUND-GUIDED LIVER LESION BIOPSY MEDICATIONS: Moderate sedation ANESTHESIA/SEDATION: Moderate (conscious) sedation was employed during this procedure. A total of Versed 2.0 mg and Fentanyl 100 mcg was administered intravenously. Moderate Sedation Time: 13 minutes. The patient's level of consciousness and vital signs were monitored continuously by radiology nursing throughout the procedure under my direct supervision. FLUOROSCOPY TIME:  None COMPLICATIONS: None immediate. PROCEDURE: Informed written consent was obtained from the patient after a thorough discussion of the procedural risks, benefits and alternatives. All questions were addressed. Translator was used. A timeout was performed prior to the initiation of the procedure. Liver was evaluated with ultrasound. Lesions in the right hepatic lobe identified. The skin was prepped with chlorhexidine and sterile field was created. Maximal barrier sterile technique was utilized including caps, mask, sterile gowns, sterile gloves, sterile drape, hand hygiene and skin antiseptic. Skin was anesthetized using 1% lidocaine. Small incision was made. Using ultrasound guidance, 17 gauge coaxial needle was directed into the right hepatic lobe and into a hypoechoic lesion. Total of 3 core biopsies  were obtained with an 18 gauge device. Specimens placed in formalin. Gel-Foam slurry was injected along  the needle tract. Bandage placed over the puncture site. FINDINGS: Lateral right hepatic lobe has a nodular contour with a geographic area of decreased echogenicity. There is at least 1 well-defined hypoechoic lesion in this area. This hypoechoic lesion was successfully targeted for biopsy. Needle position was confirmed within the lesion. Trace perihepatic ascites noted before and after the procedure. IMPRESSION: Ultrasound-guided core biopsies of a right hepatic lesion. Electronically Signed   By: Markus Daft M.D.   On: 05/15/2021 17:16   IR IMAGING GUIDED PORT INSERTION  Result Date: 05/19/2021 CLINICAL DATA:  Metastatic cancer, unknown primary EXAM: RIGHT INTERNAL JUGULAR SINGLE LUMEN POWER PORT CATHETER INSERTION Date:  05/19/2021 05/19/2021 1:20 pm Radiologist:  Jerilynn Mages. Daryll Brod, MD Guidance:  Ultrasound and fluoroscopic MEDICATIONS: 1% lidocaine local ANESTHESIA/SEDATION: Versed 2.0 mg IV; Fentanyl 100 mcg IV; Moderate Sedation Time:  22 minutes The patient was continuously monitored during the procedure by the interventional radiology nurse under my direct supervision. FLUOROSCOPY TIME:  0 minutes, 36 seconds (4.0 mGy) COMPLICATIONS: None immediate. CONTRAST:  None. PROCEDURE: Informed consent was obtained from the patient following explanation of the procedure, risks, benefits and alternatives. The patient understands, agrees and consents for the procedure. All questions were addressed. A time out was performed. Maximal barrier sterile technique utilized including caps, mask, sterile gowns, sterile gloves, large sterile drape, hand hygiene, and 2% chlorhexidine scrub. Under sterile conditions and local anesthesia, right internal jugular micropuncture venous access was performed. Access was performed with ultrasound. Images were obtained for documentation of the patent right internal jugular vein. A guide wire was inserted followed by a transitional dilator. This allowed insertion of a guide wire and catheter  into the IVC. Measurements were obtained from the SVC / RA junction back to the right IJ venotomy site. In the right infraclavicular chest, a subcutaneous pocket was created over the second anterior rib. This was done under sterile conditions and local anesthesia. 1% lidocaine with epinephrine was utilized for this. A 2.5 cm incision was made in the skin. Blunt dissection was performed to create a subcutaneous pocket over the right pectoralis major muscle. The pocket was flushed with saline vigorously. There was adequate hemostasis. The port catheter was assembled and checked for leakage. The port catheter was secured in the pocket with two retention sutures. The tubing was tunneled subcutaneously to the right venotomy site and inserted into the SVC/RA junction through a valved peel-away sheath. Position was confirmed with fluoroscopy. Images were obtained for documentation. The patient tolerated the procedure well. No immediate complications. Incisions were closed in a two layer fashion with 4 - 0 Vicryl suture. Dermabond was applied to the skin. The port catheter was accessed, blood was aspirated followed by saline and heparin flushes. Needle was removed. A dry sterile dressing was applied. IMPRESSION: Ultrasound and fluoroscopically guided right internal jugular single lumen power port catheter insertion. Tip in the SVC/RA junction. Catheter ready for use. Electronically Signed   By: Jerilynn Mages.  Shick M.D.   On: 05/19/2021 13:48   VAS [place] LOWER EXTREMITY VENOUS (DVT)  Result Date: 05/14/2021  Lower Venous DVT Study Patient Name:  [facility]  Date of Exam:   05/14/2021 Medical Rec #: [id number]   Accession #:    [id number] Date of Birth: [birth year]  Patient Gender: M Patient Age:   64Y Exam Location:  [facility] Procedure:      VAS [place] LOWER EXTREMITY  VENOUS (DVT) Referring Phys: Bretta Bang GONFA --------------------------------------------------------------------------------  Indications: Pulmonary embolism.   Anticoagulation: Heparin. Comparison Study: No prior studies. Performing Technologist: Darlin Coco RDMS,RVT  Examination Guidelines: A complete evaluation includes B-mode imaging, spectral Doppler, color Doppler, and power Doppler as needed of all accessible portions of each vessel. Bilateral testing is considered an integral part of a complete examination. Limited examinations for reoccurring indications may be performed as noted. The reflux portion of the exam is performed with the patient in reverse Trendelenburg.  +---------+---------------+---------+-----------+----------+--------------+ RIGHT    CompressibilityPhasicitySpontaneityPropertiesThrombus Aging +---------+---------------+---------+-----------+----------+--------------+ CFV      Full           Yes      Yes                                 +---------+---------------+---------+-----------+----------+--------------+ SFJ      Full                                                        +---------+---------------+---------+-----------+----------+--------------+ FV Prox  Full                                                        +---------+---------------+---------+-----------+----------+--------------+ FV Mid   Full                                                        +---------+---------------+---------+-----------+----------+--------------+ FV DistalFull                                                        +---------+---------------+---------+-----------+----------+--------------+ PFV      Full                                                        +---------+---------------+---------+-----------+----------+--------------+ POP      Full           Yes      Yes                                 +---------+---------------+---------+-----------+----------+--------------+ PTV      Full                                                         +---------+---------------+---------+-----------+----------+--------------+ PERO     Full                                                        +---------+---------------+---------+-----------+----------+--------------+   +---------+---------------+---------+-----------+----------+--------------+  LEFT     CompressibilityPhasicitySpontaneityPropertiesThrombus Aging +---------+---------------+---------+-----------+----------+--------------+ CFV      Full           Yes      Yes                                 +---------+---------------+---------+-----------+----------+--------------+ SFJ      Full                                                        +---------+---------------+---------+-----------+----------+--------------+ FV Prox  Full                                                        +---------+---------------+---------+-----------+----------+--------------+ FV Mid   Full                                                        +---------+---------------+---------+-----------+----------+--------------+ FV DistalFull                                                        +---------+---------------+---------+-----------+----------+--------------+ PFV      Full                                                        +---------+---------------+---------+-----------+----------+--------------+ POP      Full           Yes      Yes                                 +---------+---------------+---------+-----------+----------+--------------+ PTV      Full                                                        +---------+---------------+---------+-----------+----------+--------------+ PERO     Full                                                        +---------+---------------+---------+-----------+----------+--------------+     Summary: RIGHT: - There is no evidence of deep vein thrombosis in the lower extremity.  - No cystic structure found in  the popliteal fossa.  LEFT: - There is no evidence of deep vein thrombosis in the lower extremity.  - No  cystic structure found in the popliteal fossa.  *See table(s) above for measurements and observations. Electronically signed by Servando Snare MD on 05/14/2021 at 3:49:43 PM.    Final        IMPRESSION/PLAN: 1. 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 Dr. Lisbeth Renshaw discusses the pathology findings and reviews the nature of metastatic disease. However his disease burden outside of the biliary system is limited and hence, he would like to offer more definitive radiotherapy and would like to consider stereotactic radiosurgery in one fraction. We discussed the need for 1 mm thickness imaging from an MRI scan, meeting with neurosurgery and simulation. We discussed the risks, benefits, short, and long term effects of radiotherapy, as well as the local definitive intent, and the patient is interested in proceeding. Dr. Lisbeth Renshaw discusses the delivery and logistics of radiotherapy. Written consent is obtained and placed in the chart, a copy was provided to the patient. Our special procedures navigator is coordinating his MRI, simulation and referral to neurosurgery. He will continue with plans to proceed with chemotherapy as well. We will discuss his case in brain and spine oncology in preparation of his treatment as well discuss if he may be a candidate for vertebral augmentation with neurosurgery.  In a visit lasting 60 minutes, greater than 50% of the time was spent face to face discussing the patient's condition, in preparation for the discussion, and coordinating the patient's care. The assistance of a medical interpretor was also utilized during our visit.  The above documentation reflects my direct findings during this shared patient visit. Please see the separate note by Dr. Lisbeth Renshaw on this date for the remainder of the patient's plan of care.    Carola Rhine, [facility]   **Disclaimer: This note was dictated with voice recognition software. Similar sounding words can inadvertently be transcribed and this note may contain transcription errors which may not have been corrected upon publication of note.**

## 2021-05-27 NOTE — Telephone Encounter (Signed)
CRITICAL VALUE STICKER  CRITICAL VALUE: ALT 305  RECEIVER (on-site recipient of call): Georgina Pillion, RN  DATE & TIME NOTIFIED: 05/27/21; 1311  MESSENGER (representative from lab): Lelan Pons  MD NOTIFIED: Dr. Alen Blew  TIME OF NOTIFICATION: [place] acknowledged

## 2021-05-28 ENCOUNTER — Other Ambulatory Visit: Payer: Self-pay | Admitting: Hematology and Oncology

## 2021-05-28 LAB — CANCER ANTIGEN 19-9: CA 19-9: 3521 U/mL — ABNORMAL HIGH (ref 0–35)

## 2021-05-28 MED FILL — Fosaprepitant Dimeglumine For IV Infusion 150 MG (Base Eq): INTRAVENOUS | Qty: 5 | Status: AC

## 2021-05-28 MED FILL — Dexamethasone Sodium Phosphate Inj 100 MG/10ML: INTRAMUSCULAR | Qty: 1 | Status: AC

## 2021-05-29 ENCOUNTER — Other Ambulatory Visit: Payer: Self-pay | Admitting: Hematology and Oncology

## 2021-05-29 ENCOUNTER — Inpatient Hospital Stay: Payer: Managed Care, Other (non HMO)

## 2021-05-29 ENCOUNTER — Telehealth: Payer: Self-pay

## 2021-05-29 ENCOUNTER — Other Ambulatory Visit: Payer: Self-pay

## 2021-05-29 VITALS — BP 119/75 | HR 60 | Temp 98.9°F | Resp 18 | Wt 142.5 lb

## 2021-05-29 DIAGNOSIS — Z5112 Encounter for antineoplastic immunotherapy: Secondary | ICD-10-CM | POA: Diagnosis not present

## 2021-05-29 DIAGNOSIS — C249 Malignant neoplasm of biliary tract, unspecified: Secondary | ICD-10-CM

## 2021-05-29 MED ORDER — SODIUM CHLORIDE 0.9 % IV SOLN: Freq: Once | INTRAVENOUS | Status: DC

## 2021-05-29 MED ORDER — SODIUM CHLORIDE 0.9% FLUSH
10.0000 mL | INTRAVENOUS | Status: DC | PRN
  Administered 2021-05-29: 10 mL

## 2021-05-29 MED ORDER — POTASSIUM CHLORIDE IN NACL 20-0.9 MEQ/L-% IV SOLN
Freq: Once | INTRAVENOUS | Status: AC
  Filled 2021-05-29: qty 1000

## 2021-05-29 MED ORDER — SODIUM CHLORIDE 0.9 % IV SOLN
10.0000 mg | Freq: Once | INTRAVENOUS | Status: AC
  Administered 2021-05-29: 10 mg via INTRAVENOUS
  Filled 2021-05-29: qty 10

## 2021-05-29 MED ORDER — SODIUM CHLORIDE 0.9 % IV SOLN
150.0000 mg | Freq: Once | INTRAVENOUS | Status: AC
  Administered 2021-05-29: 150 mg via INTRAVENOUS
  Filled 2021-05-29: qty 150

## 2021-05-29 MED ORDER — SODIUM CHLORIDE 0.9 % IV SOLN
1500.0000 mg | Freq: Once | INTRAVENOUS | Status: AC
  Administered 2021-05-29: 1500 mg via INTRAVENOUS
  Filled 2021-05-29: qty 30

## 2021-05-29 MED ORDER — SODIUM CHLORIDE 0.9 % IV SOLN: 1500.0000 mg | Freq: Once | INTRAVENOUS | Status: DC

## 2021-05-29 MED ORDER — MAGNESIUM SULFATE 2 GM/50ML IV SOLN
2.0000 g | Freq: Once | INTRAVENOUS | Status: AC
  Administered 2021-05-29: 2 g via INTRAVENOUS
  Filled 2021-05-29: qty 50

## 2021-05-29 MED ORDER — HEPARIN SOD (PORK) LOCK FLUSH 100 UNIT/ML IV SOLN
500.0000 [IU] | Freq: Once | INTRAVENOUS | Status: AC | PRN
  Administered 2021-05-29: 500 [IU]

## 2021-05-29 MED ORDER — SODIUM CHLORIDE 0.9 % IV SOLN
25.0000 mg/m2 | Freq: Once | INTRAVENOUS | Status: AC
  Administered 2021-05-29: 41 mg via INTRAVENOUS
  Filled 2021-05-29: qty 41

## 2021-05-29 MED ORDER — SODIUM CHLORIDE 0.9 % IV SOLN: Freq: Once | INTRAVENOUS | Status: AC

## 2021-05-29 MED ORDER — SODIUM CHLORIDE 0.9 % IV SOLN
1000.0000 mg/m2 | Freq: Once | INTRAVENOUS | Status: AC
  Administered 2021-05-29: 1634 mg via INTRAVENOUS
  Filled 2021-05-29: qty 42.98

## 2021-05-29 MED ORDER — PALONOSETRON HCL INJECTION 0.25 MG/5ML
0.2500 mg | Freq: Once | INTRAVENOUS | Status: AC
  Administered 2021-05-29: 0.25 mg via INTRAVENOUS
  Filled 2021-05-29: qty 5

## 2021-05-29 NOTE — Telephone Encounter (Signed)
Called son, Otho Ket to verify that he stopped the dexamethasone Rx. Per son, he has stopped the dexamethasone and he will not take.

## 2021-05-29 NOTE — Patient Instructions (Signed)
[facility]  Discharge Instructions: Thank you for choosing [facility] to provide your oncology and hematology care.   If you have a lab appointment with the Cancer Center, please go directly to the Cancer Center and check in at the registration area.   Wear comfortable clothing and clothing appropriate for easy access to any Portacath or PICC line.   We strive to give you quality time with your provider. You may need to reschedule your appointment if you arrive late (15 or more minutes).  Arriving late affects you and other patients whose appointments are after yours.  Also, if you miss three or more appointments without notifying the office, you may be dismissed from the clinic at the provider's discretion.      For prescription refill requests, have your pharmacy contact our office and allow 72 hours for refills to be completed.    Today you received the following chemotherapy and/or immunotherapy agents gemzar, cisplatin      To help prevent nausea and vomiting after your treatment, we encourage you to take your nausea medication as directed.  BELOW ARE SYMPTOMS THAT SHOULD BE REPORTED IMMEDIATELY: *FEVER GREATER THAN 100.4 F (38 C) OR HIGHER *CHILLS OR SWEATING *NAUSEA AND VOMITING THAT IS NOT CONTROLLED WITH YOUR NAUSEA MEDICATION *UNUSUAL SHORTNESS OF BREATH *UNUSUAL BRUISING OR BLEEDING *URINARY PROBLEMS (pain or burning when urinating, or frequent urination) *BOWEL PROBLEMS (unusual diarrhea, constipation, pain near the anus) TENDERNESS IN MOUTH AND THROAT WITH OR WITHOUT PRESENCE OF ULCERS (sore throat, sores in mouth, or a toothache) UNUSUAL RASH, SWELLING OR PAIN  UNUSUAL VAGINAL DISCHARGE OR ITCHING   Items with * indicate a potential emergency and should be followed up as soon as possible or go to the Emergency Department if any problems should occur.  Please show the CHEMOTHERAPY ALERT CARD or IMMUNOTHERAPY ALERT CARD at  check-in to the Emergency Department and triage nurse.  Should you have questions after your visit or need to cancel or reschedule your appointment, please contact [facility]  Dept: [phone]  and follow the prompts.  Office hours are 8:00 a.m. to 4:30 p.m. Monday - Friday. Please note that voicemails left after 4:00 p.m. may not be returned until the following business day.  We are closed weekends and major holidays. You have access to a nurse at all times for urgent questions. Please call the main number to the clinic Dept: [phone] and follow the prompts.   For any non-urgent questions, you may also contact your provider using MyChart. We now offer e-Visits for anyone 18 and older to request care online for non-urgent symptoms. For details visit [email or web address].   Also download the MyChart app! Go to the app store, search "MyChart", open the app, select [facility], and log in with your MyChart username and password.  Due to Covid, a mask is required upon entering the hospital/clinic. If you do not have a mask, one will be given to you upon arrival. For doctor visits, patients may have 1 support person aged 18 or older with them. For treatment visits, patients cannot have anyone with them due to current Covid guidelines and our immunocompromised population.   

## 2021-06-02 ENCOUNTER — Other Ambulatory Visit: Payer: Self-pay

## 2021-06-02 ENCOUNTER — Inpatient Hospital Stay: Payer: Managed Care, Other (non HMO) | Admitting: Hematology and Oncology

## 2021-06-02 ENCOUNTER — Encounter: Payer: Self-pay | Admitting: Hematology and Oncology

## 2021-06-02 ENCOUNTER — Inpatient Hospital Stay: Payer: Managed Care, Other (non HMO)

## 2021-06-02 ENCOUNTER — Ambulatory Visit
Admission: RE | Admit: 2021-06-02 | Discharge: 2021-06-02 | Disposition: A | Discharge status: Home / Self Care | Payer: Managed Care, Other (non HMO) | Source: Ambulatory Visit | Attending: Radiation Oncology | Admitting: Radiation Oncology

## 2021-06-02 DIAGNOSIS — Z5112 Encounter for antineoplastic immunotherapy: Secondary | ICD-10-CM | POA: Diagnosis not present

## 2021-06-02 DIAGNOSIS — R7989 Other specified abnormal findings of blood chemistry: Secondary | ICD-10-CM | POA: Diagnosis not present

## 2021-06-02 DIAGNOSIS — I2699 Other pulmonary embolism without acute cor pulmonale: Secondary | ICD-10-CM

## 2021-06-02 DIAGNOSIS — Z7189 Other specified counseling: Secondary | ICD-10-CM

## 2021-06-02 DIAGNOSIS — C7951 Secondary malignant neoplasm of bone: Secondary | ICD-10-CM

## 2021-06-02 DIAGNOSIS — Z599 Problem related to housing and economic circumstances, unspecified: Secondary | ICD-10-CM | POA: Insufficient documentation

## 2021-06-02 DIAGNOSIS — C249 Malignant neoplasm of biliary tract, unspecified: Secondary | ICD-10-CM

## 2021-06-02 LAB — CBC WITH DIFFERENTIAL (CANCER CENTER ONLY)
Abs Immature Granulocytes: 0.01 10*3/uL (ref 0.00–0.07)
Basophils Absolute: 0 10*3/uL (ref 0.0–0.1)
Basophils Relative: 0 %
Eosinophils Absolute: 0.1 10*3/uL (ref 0.0–0.5)
Eosinophils Relative: 1 %
HCT: 40.8 % (ref 39.0–52.0)
Hemoglobin: 14 g/dL (ref 13.0–17.0)
Immature Granulocytes: 0 %
Lymphocytes Relative: 11 %
Lymphs Abs: 0.8 10*3/uL (ref 0.7–4.0)
MCH: 31.7 pg (ref 26.0–34.0)
MCHC: 34.3 g/dL (ref 30.0–36.0)
MCV: 92.3 fL (ref 80.0–100.0)
Monocytes Absolute: 0.1 10*3/uL (ref 0.1–1.0)
Monocytes Relative: 2 %
Neutro Abs: 6.6 10*3/uL (ref 1.7–7.7)
Neutrophils Relative %: 86 %
Platelet Count: 181 10*3/uL (ref 150–400)
RBC: 4.42 MIL/uL (ref 4.22–5.81)
RDW: 16 % — ABNORMAL HIGH (ref 11.5–15.5)
WBC Count: 7.7 10*3/uL (ref 4.0–10.5)
nRBC: 0 % (ref 0.0–0.2)

## 2021-06-02 LAB — CMP (CANCER CENTER ONLY)
ALT: 192 U/L — ABNORMAL HIGH (ref 0–44)
AST: 39 U/L (ref 15–41)
Albumin: 2.8 g/dL — ABNORMAL LOW (ref 3.5–5.0)
Alkaline Phosphatase: 414 U/L — ABNORMAL HIGH (ref 38–126)
Anion gap: 7 (ref 5–15)
BUN: 20 mg/dL (ref 8–23)
CO2: 28 mmol/L (ref 22–32)
Calcium: 8.9 mg/dL (ref 8.9–10.3)
Chloride: 104 mmol/L (ref 98–111)
Creatinine: 0.88 mg/dL (ref 0.61–1.24)
GFR, Estimated: >60 mL/min (ref >60)
Glucose, Bld: 114 mg/dL — ABNORMAL HIGH (ref 70–99)
Potassium: 4.6 mmol/L (ref 3.5–5.1)
Sodium: 139 mmol/L (ref 135–145)
Total Bilirubin: 0.7 mg/dL (ref 0.3–1.2)
Total Protein: 6.2 g/dL — ABNORMAL LOW (ref 6.5–8.1)

## 2021-06-02 LAB — MAGNESIUM: Magnesium: 2.1 mg/dL (ref 1.7–2.4)

## 2021-06-02 NOTE — Assessment & Plan Note (Addendum)
His elevated liver enzymes are due to disease, improved since we started him on treatment Observe closely Since his bilirubin is within normal range, no dose adjustment is required

## 2021-06-02 NOTE — Progress Notes (Signed)
Met w/ pt to introduce myself as his Arboriculturist, discuss copay assistance and the [employer].  Pt gave me consent to apply in his behalf so I enrolled him in the Patient Savings program for [place] thru [place].  Pt is eligible to receive up to $26,000 to assist w/ OOP for the current year.  Most pts will pay $0 per infusion.  I also enrolled him in the copay program for [employer] thru [place].  The maximum benefit is $10,000 annually.  Pt's OOP is $0 for each dose or cycle.  Pt is overqualified for the [employer].  Pt has my card for any questions or concerns he may have in the future.

## 2021-06-02 NOTE — Progress Notes (Signed)
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[place]) His liver enzymes has improved and the patient has no pain and started gaining weight Overall, I am pleased with his progress and I suspect he has clinical improvement due to treatment We will continue treatment as prescribed He will return on day 9 or 10 for G-CSF support I recommend minimum 3 cycles of treatment before repeating imaging study  Pulmonary embolism ([employer]) He will continue anticoagulation therapy indefinitely  Elevated LFTs His elevated liver enzymes are due to disease, improved since we started him on treatment Observe closely Since his bilirubin is within normal range, no dose adjustment is required  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[facility]) His bone pain is well controlled with Tylenol only He is scheduled for repeat MRI, ordered by radiation oncologist He will start palliative radiation treatment to his back  Goals of care, counseling/discussion I reviewed the plan of care with him and his son extensively, we have the help from interpreter  Financial difficulties We spent some time discussing the importance of getting his financial situation clarified now that he is unable to work I encouraged him to apply for Social Security and disability, Medicaid and Medicare in the near future  No orders of the defined types were placed in this encounter.   All questions were answered. The patient knows to call the clinic with any problems, questions or concerns. The total time spent in the appointment was 40 minutes encounter with patients including review of chart and various tests results, discussions about plan of care and coordination of care plan   Heath Lark, MD 06/02/2021 2:55 PM  INTERVAL HISTORY: Please see below for problem oriented charting. 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 He underwent liver biopsy   05/14/2021 Imaging   RIGHT:  - There is no evidence of deep vein thrombosis in the lower extremity.     - No cystic structure found in the popliteal fossa.     LEFT:  - There is no evidence of deep vein thrombosis in the lower extremity.     - No cystic structure found in the popliteal fossa.    05/14/2021 Imaging   1. Pathologic split type fracture of T4 with approximately 25% height loss, likely due to metastatic disease. 2. No other evidence of metastatic disease of the cervical, thoracic or lumbar spine. 3. Moderate left L5-S1 neural foraminal stenosis. 4. Left-greater-than-right lateral recess narrowing at L4-5 and L5-S1.     05/14/2021 Imaging   1. 5 cm ill-defined irregular low-density lesion in the anterior right liver inferiorly is highly suspicious for primary or metastatic neoplasm. The lesion obliterates bile ducts in the central right liver resulting in right hepatic lobe biliary dilatation. Similarly, portal venous anatomy in the anterior right liver is involved along the superomedial margin of the lesion. Lesion should be amenable to tissue sampling. 2. Gallbladder wall is contracted and irregular in appearance adjacent to the lesion.  Imaging features suggest that gallbladder changes are secondary although primary gallbladder neoplasm not excluded. 3. 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 No evidence for additional metastatic disease in the abdomen or pelvis. 4. Prostatomegaly. 5.  Trace free fluid in the pelvis. 6. Aortic Atherosclerosis (ICD10-I70.0).   05/15/2021 Pathology Results   Clinical History: Right hepatic tumor and evidence for bone metastasis ([facility])    FINAL MICROSCOPIC DIAGNOSIS:   A. LIVER, RIGHT, BIOPSY:  -  Adenocarcinoma  -  See comment   COMMENT:   By immunohistochemistry, the neoplastic cells are positive for cytokeratin 7, monoclonal and polyclonal CEA with patchy positivity for cytokeratin 20.  The tumor cells are negative for TTF-1, AFP, prostein, PSA and CDX2.  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     05/15/2021 Procedure   Ultrasound-guided core biopsies of a right hepatic lesion.   05/16/2021 Imaging   MRI brain  Unremarkable appearance of the brain for age. No evidence of intracranial metastases.     05/16/2021 Imaging   Ct chest  1. Study is once again positive for subsegmental sized emboli in the right lower lobe. No larger central, lobar or segmental sized emboli are noted. 2. Lytic lesion in T4 with pathologic fracture, similar to the prior study. This was better depicted on recent thoracic spine MRI. 3. Aortic atherosclerosis, in addition to left main and 3 vessel coronary artery disease. There is also mild ectasia of the ascending thoracic aorta (4.0 cm in diameter).    05/19/2021 Procedure   Ultrasound and fluoroscopically guided right internal jugular single lumen power port catheter insertion. Tip in the SVC/RA junction. Catheter ready for use.   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[place])   05/20/2021 Cancer Staging   Staging form: Intrahepatic Bile Duct, AJCC 8th Edition - Clinical stage from 05/20/2021: Stage IV (cT2, cN0, pM1) - Signed by Heath Lark, MD on 05/20/2021 Stage prefix: Initial diagnosis   05/29/2021 -  Chemotherapy    Patient is on Treatment Plan: BILIARY TRACT  CISPLATIN + GEMCITABINE D1,8 Q21D         REVIEW OF SYSTEMS:   Constitutional: Denies fevers, chills or abnormal weight loss Eyes: Denies blurriness of vision Ears, nose, mouth, throat, and face: Denies mucositis or sore throat Respiratory: Denies cough, dyspnea or wheezes Cardiovascular: Denies palpitation, chest discomfort or lower extremity swelling Gastrointestinal:  Denies nausea, heartburn or change in bowel habits Skin: Denies abnormal skin rashes Lymphatics: Denies new lymphadenopathy or easy bruising Neurological:Denies numbness, tingling or new weaknesses Behavioral/Psych: Mood is stable, no new changes  All other systems were reviewed with the patient and are negative.  I have reviewed the past medical history, past surgical history, social history and family history with the patient and they are unchanged from previous note.  ALLERGIES:  has No Known Allergies.  MEDICATIONS:  Current Outpatient Medications  Medication Sig Dispense Refill   apixaban (ELIQUIS) 5 MG TABS tablet Take 1 tablet (5 mg total) by mouth 2 (two) times daily. 60 tablet 1   lidocaine-prilocaine (EMLA) cream Apply to affected area once 30 g 3   ondansetron (ZOFRAN) 8 MG tablet Take 1 tablet by mouth every 8 hours as needed. 30 tablet 1   prochlorperazine (COMPAZINE) 10 MG tablet Take 1 tablet by mouth every 6 hours as needed (Nausea or vomiting). 30 tablet 1   senna-docusate (SENOKOT-S) 8.6-50 MG tablet Take 1 tablet  by mouth 2 (two) times daily as needed for mild constipation or moderate constipation.     No current facility-administered medications for this visit.    PHYSICAL EXAMINATION: ECOG PERFORMANCE STATUS: 1 - Symptomatic but completely ambulatory  Vitals:   06/02/21 1048  BP: 121/72  Pulse: 73  Resp: 18  Temp: 98.3 F (36.8 C)  SpO2: 100%   Filed Weights   06/02/21 1048  Weight: 142 lb (64.4 kg)    GENERAL:alert, no distress and comfortable NEURO: alert & oriented x 3  with fluent speech, no focal motor/sensory deficits  LABORATORY DATA:  I have reviewed the data as listed    Component Value Date/Time   NA 139 06/02/2021 1022   NA 145 (H) 04/28/2018 1054   K 4.6 06/02/2021 1022   CL 104 06/02/2021 1022   CO2 28 06/02/2021 1022   GLUCOSE 114 (H) 06/02/2021 1022   BUN 20 06/02/2021 1022   BUN 13 04/28/2018 1054   CREATININE 0.88 06/02/2021 1022   CALCIUM 8.9 06/02/2021 1022   PROT 6.2 (L) 06/02/2021 1022   PROT 6.8 04/28/2018 1054   ALBUMIN 2.8 (L) 06/02/2021 1022   ALBUMIN 4.4 04/28/2018 1054   AST 39 06/02/2021 1022   ALT 192 (H) 06/02/2021 1022   ALKPHOS 414 (H) 06/02/2021 1022   BILITOT 0.7 06/02/2021 1022   GFRNONAA >60 06/02/2021 1022   GFRAA >60 08/22/2018 0904    No results found for: SPEP, UPEP  Lab Results  Component Value Date   WBC 7.7 06/02/2021   NEUTROABS 6.6 06/02/2021   HGB 14.0 06/02/2021   HCT 40.8 06/02/2021   MCV 92.3 06/02/2021   PLT 181 06/02/2021      Chemistry      Component Value Date/Time   NA 139 06/02/2021 1022   NA 145 (H) 04/28/2018 1054   K 4.6 06/02/2021 1022   CL 104 06/02/2021 1022   CO2 28 06/02/2021 1022   BUN 20 06/02/2021 1022   BUN 13 04/28/2018 1054   CREATININE 0.88 06/02/2021 1022      Component Value Date/Time   CALCIUM 8.9 06/02/2021 1022   ALKPHOS 414 (H) 06/02/2021 1022   AST 39 06/02/2021 1022   ALT 192 (H) 06/02/2021 1022   BILITOT 0.7 06/02/2021 1022

## 2021-06-02 NOTE — Assessment & Plan Note (Signed)
We spent some time discussing the importance of getting his financial situation clarified now that he is unable to work I encouraged him to apply for Social Security and disability, Medicaid and Medicare in the near future

## 2021-06-02 NOTE — Assessment & Plan Note (Signed)
He will continue anticoagulation therapy indefinitely 

## 2021-06-02 NOTE — Assessment & Plan Note (Signed)
His bone pain is well controlled with Tylenol only He is scheduled for repeat MRI, ordered by radiation oncologist He will start palliative radiation treatment to his back

## 2021-06-02 NOTE — Assessment & Plan Note (Signed)
I reviewed the plan of care with him and his son extensively, we have the help from interpreter

## 2021-06-02 NOTE — Assessment & Plan Note (Signed)
His liver enzymes has improved and the patient has no pain and started gaining weight Overall, I am pleased with his progress and I suspect he has clinical improvement due to treatment We will continue treatment as prescribed He will return on day 9 or 10 for G-CSF support I recommend minimum 3 cycles of treatment before repeating imaging study

## 2021-06-03 ENCOUNTER — Ambulatory Visit
Admission: RE | Admit: 2021-06-03 | Discharge: 2021-06-03 | Disposition: A | Discharge status: Home / Self Care | Payer: Managed Care, Other (non HMO) | Source: Ambulatory Visit | Attending: Radiation Oncology | Admitting: Radiation Oncology

## 2021-06-03 DIAGNOSIS — C7951 Secondary malignant neoplasm of bone: Secondary | ICD-10-CM

## 2021-06-03 MED ORDER — GADOBENATE DIMEGLUMINE 529 MG/ML IV SOLN
13.0000 mL | Freq: Once | INTRAVENOUS | Status: AC | PRN
  Administered 2021-06-03: 13 mL via INTRAVENOUS

## 2021-06-04 MED FILL — Fosaprepitant Dimeglumine For IV Infusion 150 MG (Base Eq): INTRAVENOUS | Qty: 5 | Status: AC

## 2021-06-04 MED FILL — Dexamethasone Sodium Phosphate Inj 100 MG/10ML: INTRAMUSCULAR | Qty: 1 | Status: AC

## 2021-06-05 ENCOUNTER — Inpatient Hospital Stay: Payer: Managed Care, Other (non HMO)

## 2021-06-05 ENCOUNTER — Other Ambulatory Visit: Payer: Self-pay

## 2021-06-05 VITALS — BP 117/78 | HR 61 | Temp 98.4°F | Resp 18

## 2021-06-05 DIAGNOSIS — Z5112 Encounter for antineoplastic immunotherapy: Secondary | ICD-10-CM | POA: Diagnosis not present

## 2021-06-05 DIAGNOSIS — C249 Malignant neoplasm of biliary tract, unspecified: Secondary | ICD-10-CM

## 2021-06-05 MED ORDER — SODIUM CHLORIDE 0.9 % IV SOLN
150.0000 mg | Freq: Once | INTRAVENOUS | Status: AC
  Administered 2021-06-05: 150 mg via INTRAVENOUS
  Filled 2021-06-05: qty 150

## 2021-06-05 MED ORDER — POTASSIUM CHLORIDE IN NACL 20-0.9 MEQ/L-% IV SOLN
Freq: Once | INTRAVENOUS | Status: AC
  Filled 2021-06-05: qty 1000

## 2021-06-05 MED ORDER — SODIUM CHLORIDE 0.9 % IV SOLN
10.0000 mg | Freq: Once | INTRAVENOUS | Status: AC
  Administered 2021-06-05: 10 mg via INTRAVENOUS
  Filled 2021-06-05: qty 10

## 2021-06-05 MED ORDER — HEPARIN SOD (PORK) LOCK FLUSH 100 UNIT/ML IV SOLN
500.0000 [IU] | Freq: Once | INTRAVENOUS | Status: AC | PRN
  Administered 2021-06-05: 500 [IU]

## 2021-06-05 MED ORDER — GEMCITABINE HCL CHEMO INJECTION 1 GM/26.3ML
1000.0000 mg/m2 | Freq: Once | INTRAVENOUS | Status: AC
  Administered 2021-06-05: 1672 mg via INTRAVENOUS
  Filled 2021-06-05: qty 43.97

## 2021-06-05 MED ORDER — SODIUM CHLORIDE 0.9 % IV SOLN: Freq: Once | INTRAVENOUS | Status: AC

## 2021-06-05 MED ORDER — SODIUM CHLORIDE 0.9 % IV SOLN
25.0000 mg/m2 | Freq: Once | INTRAVENOUS | Status: AC
  Administered 2021-06-05: 42 mg via INTRAVENOUS
  Filled 2021-06-05: qty 42

## 2021-06-05 MED ORDER — PALONOSETRON HCL INJECTION 0.25 MG/5ML
0.2500 mg | Freq: Once | INTRAVENOUS | Status: AC
  Administered 2021-06-05: 0.25 mg via INTRAVENOUS
  Filled 2021-06-05: qty 5

## 2021-06-05 MED ORDER — SODIUM CHLORIDE 0.9% FLUSH
10.0000 mL | INTRAVENOUS | Status: DC | PRN
  Administered 2021-06-05: 10 mL

## 2021-06-05 MED ORDER — MAGNESIUM SULFATE 2 GM/50ML IV SOLN
2.0000 g | Freq: Once | INTRAVENOUS | Status: AC
  Administered 2021-06-05: 2 g via INTRAVENOUS
  Filled 2021-06-05: qty 50

## 2021-06-05 NOTE — Patient Instructions (Signed)
[facility] MEDICAL ONCOLOGY  Discharge Instructions: Thank you for choosing [facility] to provide your oncology and hematology care.   If you have a lab appointment with the Cancer Center, please go directly to the Cancer Center and check in at the registration area.   Wear comfortable clothing and clothing appropriate for easy access to any Portacath or PICC line.   We strive to give you quality time with your provider. You may need to reschedule your appointment if you arrive late (15 or more minutes).  Arriving late affects you and other patients whose appointments are after yours.  Also, if you miss three or more appointments without notifying the office, you may be dismissed from the clinic at the provider's discretion.      For prescription refill requests, have your pharmacy contact our office and allow 72 hours for refills to be completed.    Today you received the following chemotherapy and/or immunotherapy agents gemzar, cisplatin      To help prevent nausea and vomiting after your treatment, we encourage you to take your nausea medication as directed.  BELOW ARE SYMPTOMS THAT SHOULD BE REPORTED IMMEDIATELY: *FEVER GREATER THAN 100.4 F (38 C) OR HIGHER *CHILLS OR SWEATING *NAUSEA AND VOMITING THAT IS NOT CONTROLLED WITH YOUR NAUSEA MEDICATION *UNUSUAL SHORTNESS OF BREATH *UNUSUAL BRUISING OR BLEEDING *URINARY PROBLEMS (pain or burning when urinating, or frequent urination) *BOWEL PROBLEMS (unusual diarrhea, constipation, pain near the anus) TENDERNESS IN MOUTH AND THROAT WITH OR WITHOUT PRESENCE OF ULCERS (sore throat, sores in mouth, or a toothache) UNUSUAL RASH, SWELLING OR PAIN  UNUSUAL VAGINAL DISCHARGE OR ITCHING   Items with * indicate a potential emergency and should be followed up as soon as possible or go to the Emergency Department if any problems should occur.  Please show the CHEMOTHERAPY ALERT CARD or IMMUNOTHERAPY ALERT CARD at  check-in to the Emergency Department and triage nurse.  Should you have questions after your visit or need to cancel or reschedule your appointment, please contact [facility]COL[facility]  Dept: [phone]  and follow the prompts.  Office hours are 8:00 a.m. to 4:30 p.m. Monday - Friday. Please note that voicemails left after 4:00 p.m. may not be returned until the following business day.  We are closed weekends and major holidays. You have access to a nurse at all times for urgent questions. Please call the main number to the clinic Dept: [phone] and follow the prompts.   For any non-urgent questions, you may also contact your provider using MyChart. We now offer e-Visits for anyone 18 and older to request care online for non-urgent symptoms. For details visit [email or web address].   Also download the MyChart app! Go to the app store, search "MyChart", open the app, select [facility], and log in with your MyChart username and password.  Due to Covid, a mask is required upon entering the hospital/clinic. If you do not have a mask, one will be given to you upon arrival. For doctor visits, patients may have 1 support person aged 18 or older with them. For treatment visits, patients cannot have anyone with them due to current Covid guidelines and our immunocompromised population.   

## 2021-06-09 ENCOUNTER — Ambulatory Visit
Admission: RE | Admit: 2021-06-09 | Discharge: 2021-06-09 | Disposition: A | Discharge status: Home / Self Care | Payer: Managed Care, Other (non HMO) | Source: Ambulatory Visit | Attending: Radiation Oncology | Admitting: Radiation Oncology

## 2021-06-09 ENCOUNTER — Other Ambulatory Visit: Payer: Self-pay

## 2021-06-09 ENCOUNTER — Encounter: Payer: Self-pay | Admitting: Radiation Oncology

## 2021-06-09 DIAGNOSIS — Z5112 Encounter for antineoplastic immunotherapy: Secondary | ICD-10-CM | POA: Diagnosis not present

## 2021-06-09 NOTE — Progress Notes (Signed)
Mr. Calvi rested with [place] for 30 minutes following his SRS treatment.  Patient denies headache, dizziness, nausea, diplopia or ringing in the ears. Denies fatigue. Patient without complaints. Understands to avoid strenuous activity for the next 24 hours and call [phone] with needs.  Ambulated out of the clinic without difficulty, with son and interpreter.  BP (!) 152/79   Pulse 60   Temp 98.2 F (36.8 C)   Resp 18   SpO2 100%    Shonita Rinck M. Leonie Green, BSN

## 2021-06-09 NOTE — Progress Notes (Signed)
The patient was seen today following his stereotactic radiation to the spine.  He tolerated this well but yesterday developed a truncal rash.  He has not had any recent changes to soaps lotions or detergents at home.  He states that the rash is somewhat itchy.  He denies shortness of breath or chest pain.  He is not having any fevers.  He has not had any recent known sick contacts or antibiotic therapies.  His last course of systemic therapy with carboplatin.  And Gemzar was on 06/05/2021 this was day 8 of his cycle on day 1 which was 05/29/2021 he did receive Imfinzi as well.  I suspect his rash is related to his immunotherapy.  I will send a message to Dr. Simeon Craft such.  Given his ongoing plans for immunotherapy would not recommend oral steroids at this time but consideration of topical hydrocortisone and discussed this with his son and with the medical interpreter.  We also discussed oral Benadryl if itching progresses.  See images below he has a truncal macular papular rash throughout his trunk, it is more noticeable along the right flank, but I do not think that it is tinea related.

## 2021-06-09 NOTE — Op Note (Signed)
Name: Jason Lyons    MRN: [id number]   Date: 06/09/2021    DOB: 09-20-1956   STEREOTACTIC BODY RADIOTHERAPY OPERATIVE NOTE  PRE-OPERATIVE DIAGNOSIS:  T4 metastatic spine disease  POST-OPERATIVE DIAGNOSIS:  T4 metastatic spine disease  PROCEDURE:  Stereotactic Body Radiotherapy using TrueBeam Linac device  SURGEON:  Duffy Rhody, MD  RADIATION ONCOLOGIST: Kyung Rudd, MD  TECHNIQUE:  The patient underwent a radiation treatment planning session in the radiation oncology simulation suite under the care of the radiation oncology physician and physicist.  I participated closely in the radiation treatment planning afterwards. The patient underwent planning CT which was fused to a planning MRI.  These images were fused on the planning system.  We contoured the gross target volumes to involve the tumor, epidural disease and bilateral pedicles and subsequently expanded this to yield the Planning Target Volume. I actively participated in the planning process.  I helped to define and review the target contours, specifically esophagus and spinal cord.  All the dose constraints for critical structures were reviewed and within appropriate limits.  The prescription dose conformity was reviewed.  I approved the plan electronically.    Accordingly, [name]  was brought to the TrueBeam stereotactic radiation treatment linac and placed in the custom immobilization mask.  The patient was aligned according to the IR fiducial markers with BrainLab Exactrac as well as with cone beam CT to make appropriate shifts for alignment with the robotic table.  Dr. Lisbeth Renshaw and I approved the alignment.  [name] received stereotactic body radiotherapy to a prescription dose of 18 Gy uneventfully.    The detailed description of the procedure is recorded in the radiation oncology procedure note.  I was present for the duration of the procedure.  DISPOSITION:   Following delivery, the patient was transported to nursing in  stable condition and monitored for possible acute effects to be discharged to home in stable condition with follow-up in one month.  Duffy Rhody, MD [facility]

## 2021-06-10 ENCOUNTER — Inpatient Hospital Stay: Payer: Managed Care, Other (non HMO)

## 2021-06-10 ENCOUNTER — Inpatient Hospital Stay (HOSPITAL_BASED_OUTPATIENT_CLINIC_OR_DEPARTMENT_OTHER): Payer: Managed Care, Other (non HMO) | Admitting: Physician Assistant

## 2021-06-10 VITALS — BP 145/79 | HR 66 | Temp 98.2°F | Resp 15 | Ht 62.0 in | Wt 139.6 lb

## 2021-06-10 DIAGNOSIS — L270 Generalized skin eruption due to drugs and medicaments taken internally: Secondary | ICD-10-CM

## 2021-06-10 DIAGNOSIS — C249 Malignant neoplasm of biliary tract, unspecified: Secondary | ICD-10-CM

## 2021-06-10 DIAGNOSIS — Z5112 Encounter for antineoplastic immunotherapy: Secondary | ICD-10-CM | POA: Diagnosis not present

## 2021-06-10 LAB — CMP (CANCER CENTER ONLY)
ALT: 78 U/L — ABNORMAL HIGH (ref 0–44)
AST: 21 U/L (ref 15–41)
Albumin: 3.1 g/dL — ABNORMAL LOW (ref 3.5–5.0)
Alkaline Phosphatase: 385 U/L — ABNORMAL HIGH (ref 38–126)
Anion gap: 7 (ref 5–15)
BUN: 14 mg/dL (ref 8–23)
CO2: 28 mmol/L (ref 22–32)
Calcium: 9.1 mg/dL (ref 8.9–10.3)
Chloride: 106 mmol/L (ref 98–111)
Creatinine: 0.83 mg/dL (ref 0.61–1.24)
GFR, Estimated: >60 mL/min (ref >60)
Glucose, Bld: 87 mg/dL (ref 70–99)
Potassium: 4.8 mmol/L (ref 3.5–5.1)
Sodium: 141 mmol/L (ref 135–145)
Total Bilirubin: 0.5 mg/dL (ref 0.3–1.2)
Total Protein: 6.4 g/dL — ABNORMAL LOW (ref 6.5–8.1)

## 2021-06-10 LAB — CBC WITH DIFFERENTIAL (CANCER CENTER ONLY)
Abs Immature Granulocytes: 0 10*3/uL (ref 0.00–0.07)
Basophils Absolute: 0 10*3/uL (ref 0.0–0.1)
Basophils Relative: 0 %
Eosinophils Absolute: 0 10*3/uL (ref 0.0–0.5)
Eosinophils Relative: 1 %
HCT: 38.4 % — ABNORMAL LOW (ref 39.0–52.0)
Hemoglobin: 13 g/dL (ref 13.0–17.0)
Immature Granulocytes: 0 %
Lymphocytes Relative: 24 %
Lymphs Abs: 0.8 10*3/uL (ref 0.7–4.0)
MCH: 31.4 pg (ref 26.0–34.0)
MCHC: 33.9 g/dL (ref 30.0–36.0)
MCV: 92.8 fL (ref 80.0–100.0)
Monocytes Absolute: 0 10*3/uL — ABNORMAL LOW (ref 0.1–1.0)
Monocytes Relative: 1 %
Neutro Abs: 2.4 10*3/uL (ref 1.7–7.7)
Neutrophils Relative %: 74 %
Platelet Count: 76 10*3/uL — ABNORMAL LOW (ref 150–400)
RBC: 4.14 MIL/uL — ABNORMAL LOW (ref 4.22–5.81)
RDW: 15.1 % (ref 11.5–15.5)
WBC Count: 3.2 10*3/uL — ABNORMAL LOW (ref 4.0–10.5)
nRBC: 0 % (ref 0.0–0.2)

## 2021-06-10 LAB — MAGNESIUM: Magnesium: 2.1 mg/dL (ref 1.7–2.4)

## 2021-06-10 MED ORDER — METHYLPREDNISOLONE 4 MG PO TBPK: ORAL_TABLET | ORAL | 0 refills | Status: DC

## 2021-06-10 NOTE — Patient Instructions (Addendum)
-  I have sent a prescription for a Medrol Dosepak to your pharmacy.  Please take the tablets first thing in the morning as it could cause insomnia.  It also may increase your appetite or make you feel hot/flushed. -It would also be a good idea to use an antihistamine daily until this improves such as Benadryl.  Benadryl can make you sleepy so please don't drive if you take this. If it causes too much drowsiness, you can take one of the other antihistamines instead such as Claritin or Allegra which are also over the counter. Please refer to the labeling on the bottle for dosing.  -He may also use hydrocortisone cream or Benadryl cream for itching if needed. These are also over the counter.  -If he needs anything additional, also he could use Aveeno oatmeal lotion or Sarna lotion.  -I will make sure that your injection is scheduled moving forward.  Unfortunately, we are out of the window to administer the injection today.  Your infection fighting cells are within normal limits today.  We will need to keep a close eye on this weekly. If you develop any signs or symptoms of infection, please call us.  -When you see Dr. Alvy Bimler next week, she will discuss what her recommendations are regarding future immunotherapy use -Your platelet count is a little on the low side today.  No need to stop your blood thinner (Eliquis) since your platelets are above 50,000.  However, if you develop any abnormal bleeding or bruising please call us immediately so we can check your blood count.

## 2021-06-10 NOTE — Progress Notes (Signed)
[facility]   Pcp, No No address on file  DIAGNOSIS: Cancer of Biliary Tract  Oncology History  Cancer of biliary tract [facility])  05/13/2021 - 05/19/2021 Hospital Admission   He was admitted for management of back pain, found to have metastatic cancer and PE. He underwent liver biopsy   05/14/2021 Imaging   RIGHT:  - There is no evidence of deep vein thrombosis in the lower extremity.     - No cystic structure found in the popliteal fossa.     LEFT:  - There is no evidence of deep vein thrombosis in the lower extremity.     - No cystic structure found in the popliteal fossa.    05/14/2021 Imaging   1. Pathologic split type fracture of T4 with approximately 25% height loss, likely due to metastatic disease. 2. No other evidence of metastatic disease of the cervical, thoracic or lumbar spine. 3. Moderate left L5-S1 neural foraminal stenosis. 4. Left-greater-than-right lateral recess narrowing at L4-5 and L5-S1.     05/14/2021 Imaging   1. 5 cm ill-defined irregular low-density lesion in the anterior right liver inferiorly is highly suspicious for primary or metastatic neoplasm. The lesion obliterates bile ducts in the central right liver resulting in right hepatic lobe biliary dilatation. Similarly, portal venous anatomy in the anterior right liver is involved along the superomedial margin of the lesion. Lesion should be amenable to tissue sampling. 2. Gallbladder wall is contracted and irregular in appearance adjacent to the lesion. Imaging features suggest that gallbladder changes are secondary although primary gallbladder neoplasm not excluded. 3. 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 No evidence for additional metastatic disease in the abdomen or pelvis. 4. Prostatomegaly. 5. Trace free fluid in the pelvis. 6. Aortic Atherosclerosis (ICD10-I70.0).   05/15/2021 Pathology Results   Clinical History: Right hepatic tumor and evidence for bone  metastasis (crm)    FINAL MICROSCOPIC DIAGNOSIS:   A. LIVER, RIGHT, BIOPSY:  -  Adenocarcinoma  -  See comment   COMMENT:   By immunohistochemistry, the neoplastic cells are positive for cytokeratin 7, monoclonal and polyclonal CEA with patchy positivity for cytokeratin 20.  The tumor cells are negative for TTF-1, AFP, prostein, PSA and CDX2.  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     05/15/2021 Procedure   Ultrasound-guided core biopsies of a right hepatic lesion.   05/16/2021 Imaging   MRI brain  Unremarkable appearance of the brain for age. No evidence of intracranial metastases.     05/16/2021 Imaging   Ct chest  1. Study is once again positive for subsegmental sized emboli in the right lower lobe. No larger central, lobar or segmental sized emboli are noted. 2. Lytic lesion in T4 with pathologic fracture, similar to the prior study. This was better depicted on recent thoracic spine MRI. 3. Aortic atherosclerosis, in addition to left main and 3 vessel coronary artery disease. There is also mild ectasia of the ascending thoracic aorta (4.0 cm in diameter).    05/19/2021 Procedure   Ultrasound and fluoroscopically guided right internal jugular single lumen power port catheter insertion. Tip in the SVC/RA junction. Catheter ready for use.   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[place])   05/20/2021 Cancer Staging   Staging form: Intrahepatic Bile Duct, AJCC 8th Edition - Clinical stage from 05/20/2021: Stage IV (cT2, cN0, pM1) - Signed by Heath Lark, MD  on 05/20/2021 Stage prefix: Initial diagnosis   05/29/2021 -  Chemotherapy    Patient is on Treatment Plan: BILIARY TRACT CISPLATIN + GEMCITABINE D1,8 Q21D         CURRENT THERAPY: Cisplatin 25 mg per metered squared on days 1 and 8, gemcitabine 1000 mg on days 1 and 8, and Imfinzi on  day 1 IV every 3 weeks.  Status post 1 cycle.  INTERVAL HISTORY: Jason Lyons 65 y.o. male returns to the clinic today for an acute visit accompanied by his son.  The patient was seen in radiation oncology yesterday and had the concern a diffuse skin rash.  The patient is here today for further evaluation management of the rash.  The patient received cisplatin, gemcitabine, and Imfinzi on 05/29/2021.  He received cisplatin and gemcitabine on 06/02/2021.  The patient developed a rash in the truncal region 2 days ago. They believe it may be fading somewhat. He denies any recent changes with soaps, lotions, or detergents.  The patient lives at home with his son and wife, they deny similar symptoms.  He reports some mild itching.  He denies any stinging, tenderness, or pain.  He has not noticed anything to help alleviate the rash except Vaseline which helps somewhat.  Denies any recent recreational or new occupational exposures. He does walk outside almost daily but denies any known exposures to plants/insects. Denies any known allergies.  Denies any pets.  Denies any over-the-counter medications or other new medications.   Per chart review, it appears that the patient missed his injection after completing chemotherapy.  Denies any signs and symptoms of infection occluding fever, chills, sore throat, nasal congestion, cough, or dysuria. No abnormal bleeding/bruising. He is on Eliquis for pulmonary embolism. He is here today for evaluation and management of his rash.    MEDICAL HISTORY:No past medical history on file.  ALLERGIES:  has No Known Allergies.  MEDICATIONS:  Current Outpatient Medications  Medication Sig Dispense Refill   apixaban (ELIQUIS) 5 MG TABS tablet Take 1 tablet (5 mg total) by mouth 2 (two) times daily. 60 tablet 1   lidocaine-prilocaine (EMLA) cream Apply to affected area once 30 g 3   ondansetron (ZOFRAN) 8 MG tablet Take 1 tablet by mouth every 8 hours as needed. 30 tablet 1    prochlorperazine (COMPAZINE) 10 MG tablet Take 1 tablet by mouth every 6 hours as needed (Nausea or vomiting). 30 tablet 1   senna-docusate (SENOKOT-S) 8.6-50 MG tablet Take 1 tablet by mouth 2 (two) times daily as needed for mild constipation or moderate constipation.     No current facility-administered medications for this visit.    SURGICAL HISTORY:  Past Surgical History:  Procedure Laterality Date   IR IMAGING GUIDED PORT INSERTION  05/19/2021    REVIEW OF SYSTEMS:   Review of Systems  Constitutional: Negative for appetite change, chills, fatigue, fever and unexpected weight change.  HENT: Negative for mouth sores, nosebleeds, sore throat and trouble swallowing.   Eyes: Negative for eye problems and icterus.  Respiratory: Negative for cough, hemoptysis, shortness of breath and wheezing.   Cardiovascular: Negative for chest pain and leg swelling.  Gastrointestinal: Negative for abdominal pain, constipation, diarrhea, nausea and vomiting.  Genitourinary: Negative for bladder incontinence, difficulty urinating, dysuria, frequency and hematuria.   Musculoskeletal: Negative for back pain, gait problem, neck pain and neck stiffness.  Skin: Positive for itching and rash on trunk. Neurological: Negative for dizziness, extremity weakness, gait problem, headaches, light-headedness and seizures.  Hematological:  Negative for adenopathy. Does not bruise/bleed easily.  Psychiatric/Behavioral: Negative for confusion, depression and sleep disturbance. The patient is not nervous/anxious.     PHYSICAL EXAMINATION:  Blood pressure (!) 145/79, pulse 66, temperature 98.2 F (36.8 C), temperature source Oral, resp. rate 15, height '5\' 2"'$  (1.575 m), weight 139 lb 9.6 oz (63.3 kg), SpO2 100 %.  ECOG PERFORMANCE STATUS: 1  Physical Exam  Constitutional: Oriented to person, place, and time and well-developed, well-nourished, and in no distress. HENT:  Head: Normocephalic and atraumatic.  Mouth/Throat:  Oropharynx is clear and moist. No oropharyngeal exudate.  Eyes: Conjunctivae are normal. Right eye exhibits no discharge. Left eye exhibits no discharge. No scleral icterus.  Neck: Normal range of motion. Neck supple.  Cardiovascular: Normal rate, regular rhythm, normal heart sounds and intact distal pulses.   Pulmonary/Chest: Effort normal and breath sounds normal. No respiratory distress. No wheezes. No rales.  Abdominal: Soft. Bowel sounds are normal. Exhibits no distension and no mass. There is no tenderness.  Musculoskeletal: Normal range of motion. Exhibits no edema.  Lymphadenopathy:    No cervical adenopathy.  Neurological: Alert and oriented to person, place, and time. Exhibits normal muscle tone. Gait normal. Coordination normal.  Skin: Skin is warm and dry.  Truncal maculopapular rash throughout the trunk.    Psychiatric: Mood, memory and judgment normal.  Vitals reviewed.  LABORATORY DATA: Lab Results  Component Value Date   WBC 3.2 (L) 06/10/2021   HGB 13.0 06/10/2021   HCT 38.4 (L) 06/10/2021   MCV 92.8 06/10/2021   PLT 76 (L) 06/10/2021      Chemistry      Component Value Date/Time   NA 141 06/10/2021 1133   NA 145 (H) 04/28/2018 1054   K 4.8 06/10/2021 1133   CL 106 06/10/2021 1133   CO2 28 06/10/2021 1133   BUN 14 06/10/2021 1133   BUN 13 04/28/2018 1054   CREATININE 0.83 06/10/2021 1133      Component Value Date/Time   CALCIUM 9.1 06/10/2021 1133   ALKPHOS 385 (H) 06/10/2021 1133   AST 21 06/10/2021 1133   ALT 78 (H) 06/10/2021 1133   BILITOT 0.5 06/10/2021 1133       RADIOGRAPHIC STUDIES:  CT ABDOMEN PELVIS W WO CONTRAST  Result Date: 05/15/2021 CLINICAL DATA:  Masslike area identified in the liver on recent CTA Chest. EXAM: CT ABDOMEN AND PELVIS WITHOUT AND WITH CONTRAST TECHNIQUE: Multidetector CT imaging of the abdomen and pelvis was performed following the standard protocol before and following the bolus administration of intravenous contrast.  CONTRAST:  163m OMNIPAQUE IOHEXOL 350 MG/ML SOLN COMPARISON:  CTA Chest 05/13/2021 FINDINGS: Lower chest: Dependent atelectasis in both lower lobes. Hepatobiliary: 5.0 x 3.3 x 4.5 cm ill-defined irregular low-density lesion is identified in the anterior right liver inferiorly. Arterial phase imaging shows subtle irregular peripheral enhancement with heterogeneous enhancement in the lesion inferiorly. Lesion takes on a more infiltrative appearance at the inferior tip of the liver extending more posteriorly. The mass tracks along the gallbladder fossa towards the fundus in the gallbladder is contracted and irregular in appearance at this location. Focal biliary dilatation is seen in the anterior right liver (segment VIII) into a lesser degree in the posterior right liver (segments VI and VII). There is abrupt cut off of the dilated bile ducts along the medial aspect of the right hepatic lobe lesion. Similarly, portal venous anatomy appears obliterated centrally in the anterior right liver along the superomedial margin of the lesion. As above,  gallbladder appears irregular with diffuse wall thickening. No extrahepatic biliary duct dilatation. Pancreas: No focal mass lesion. No dilatation of the main duct. No intraparenchymal cyst. No peripancreatic edema. Spleen: No splenomegaly. No focal mass lesion. Adrenals/Urinary Tract: No adrenal nodule or mass. Kidneys unremarkable. No evidence for hydroureter. The urinary bladder appears normal for the degree of distention. Stomach/Bowel: Stomach is unremarkable. No gastric wall thickening. No evidence of outlet obstruction. Duodenum is normally positioned as is the ligament of Treitz. No small bowel wall thickening. No small bowel dilatation. The terminal ileum is normal. The appendix is normal. No gross colonic mass. No colonic wall thickening. Vascular/Lymphatic: Insert calcium aorta upper normal lymph nodes are seen in the hepatoduodenal ligament. No retroperitoneal  lymphadenopathy No pelvic sidewall lymphadenopathy. Reproductive: Prostate gland is enlarged with heterogeneous enhancement and dystrophic calcification. Other: Trace free fluid is noted in the pelvis. Musculoskeletal: No worrisome lytic or sclerotic osseous abnormality. IMPRESSION: 1. 5 cm ill-defined irregular low-density lesion in the anterior right liver inferiorly is highly suspicious for primary or metastatic neoplasm. The lesion obliterates bile ducts in the central right liver resulting in right hepatic lobe biliary dilatation. Similarly, portal venous anatomy in the anterior right liver is involved along the superomedial margin of the lesion. Lesion should be amenable to tissue sampling. 2. Gallbladder wall is contracted and irregular in appearance adjacent to the lesion. Imaging features suggest that gallbladder changes are secondary although primary gallbladder neoplasm not excluded. 3. 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 No evidence for additional metastatic disease in the abdomen or pelvis. 4. Prostatomegaly. 5. Trace free fluid in the pelvis. 6. Aortic Atherosclerosis (ICD10-I70.0). Electronically Signed   By: Misty Stanley M.D.   On: 05/15/2021 05:53   DG Chest 2 View  Result Date: 05/13/2021 CLINICAL DATA:  Chest pain. EXAM: CHEST - 2 VIEW COMPARISON:  No prior. FINDINGS: Mediastinum and hilar structures normal. Low lung volumes with mild bibasilar atelectasis. No pleural effusion or pneumothorax. Heart size normal. Mild thoracic spine scoliosis. No acute bony abnormality. IMPRESSION: Low lung volumes with mild bibasilar atelectasis. Electronically Signed   By: Marcello Moores  Register   On: 05/13/2021 12:04   CT Angio Chest Pulmonary Embolism (PE) W or WO Contrast  Result Date: 05/16/2021 CLINICAL DATA:  65 year old male with clinical concern for pulmonary embolism. Follow-up study. EXAM: CT ANGIOGRAPHY CHEST WITH CONTRAST TECHNIQUE: Multidetector CT imaging of the chest was  performed using the standard protocol during bolus administration of intravenous contrast. Multiplanar CT image reconstructions and MIPs were obtained to evaluate the vascular anatomy. CONTRAST:  62m OMNIPAQUE IOHEXOL 350 MG/ML SOLN COMPARISON:  Chest CTA 05/13/2021. FINDINGS: Cardiovascular: Again noted are small subsegmental sized filling defects in pulmonary artery branches in the right lower lobe best appreciated on axial images 169-178 of series 5. No other central, lobar or segmental sized filling defects are noted elsewhere in the pulmonary arterial tree. Heart size is normal. There is no significant pericardial fluid, thickening or pericardial calcification. There is aortic atherosclerosis, as well as atherosclerosis of the great vessels of the mediastinum and the coronary arteries, including calcified atherosclerotic plaque in the left main, left anterior descending and left circumflex coronary arteries. Ectasia of ascending thoracic aorta (4.0 cm in diameter). Mediastinum/Nodes: No pathologically enlarged mediastinal or hilar lymph nodes. Esophagus is unremarkable in appearance. No axillary lymphadenopathy. Lungs/Pleura: Dependent areas of subsegmental atelectasis are noted in the lower lobes of the lungs bilaterally. No acute consolidative airspace disease. No pleural effusions. 3 mm pulmonary nodule in  the apex of the left upper lobe (axial image 28 of series 6). No other larger more suspicious appearing pulmonary nodules or masses are noted. Upper Abdomen: Please see dictation for CT of the abdomen and pelvis 05/14/2021 for full description of findings below the diaphragm, which includes a large liver mass. Musculoskeletal: Lytic lesion in L4 with pathologic fracture resulting in 25% loss of posterior vertebral body height and 4 mm of retropulsion of fracture fragments impinging upon the central spinal canal. Review of the MIP images confirms the above findings. IMPRESSION: 1. Study is once again  positive for subsegmental sized emboli in the right lower lobe. No larger central, lobar or segmental sized emboli are noted. 2. Lytic lesion in T4 with pathologic fracture, similar to the prior study. This was better depicted on recent thoracic spine MRI. 3. Aortic atherosclerosis, in addition to left main and 3 vessel coronary artery disease. There is also mild ectasia of the ascending thoracic aorta (4.0 cm in diameter). Recommend annual imaging followup by CTA or MRA. This recommendation follows 2010 ACCF/AHA/AATS/ACR/ASA/SCA/SCAI/SIR/STS/SVM Guidelines for the Diagnosis and Management of Patients with Thoracic Aortic Disease. Circulation. 2010; [id number]. Aortic aneurysm NOS (ICD10-I71.9). Aortic Atherosclerosis (ICD10-I70.0). Electronically Signed   By: Vinnie Langton M.D.   On: 05/16/2021 10:24   CT Angio Chest PE W and/or Wo Contrast  Addendum Date: 05/15/2021   ADDENDUM REPORT: 05/15/2021 10:12 ADDENDUM: Case was reviewed with multiple radiologists, subsegmental PE in the right lower lobe is possible, however conclusive evaluation is limited due to respiratory motion artifact. If the presence or absence of subsegmental pulmonary embolus would change management, repeat chest CTA could be considered. Electronically Signed   By: Yetta Glassman MD   On: 05/15/2021 10:12   Result Date: 05/15/2021 CLINICAL DATA:  Shortness of breath EXAM: CT ANGIOGRAPHY CHEST WITH CONTRAST TECHNIQUE: Multidetector CT imaging of the chest was performed using the standard protocol during bolus administration of intravenous contrast. Multiplanar CT image reconstructions and MIPs were obtained to evaluate the vascular anatomy. CONTRAST:  38m OMNIPAQUE IOHEXOL 350 MG/ML SOLN COMPARISON:  None. FINDINGS: Cardiovascular: Adequate contrast opacification of the pulmonary arteries. Subsegmental pulmonary embolus seen in the right lower lobe pulmonary arteries. There is normal heart size. No pericardial effusion. Ascending  thoracic aorta is upper limits of normal in size measuring 3.9 cm atherosclerotic disease of the thoracic aorta. Mediastinum/Nodes: No enlarged mediastinal, hilar, or axillary lymph nodes. Thyroid gland, trachea, and esophagus demonstrate no significant findings. Lungs/Pleura: Lungs are clear. No pleural effusion or pneumothorax. No Upper Abdomen: Ill-defined masslike area seen in the right lobe of the liver measuring approximately 6.2 x 5.2 cm with adjacent areas of linear low density. Musculoskeletal: Lytic lesion of the T4 vertebral body with possible soft tissue extension into the left neural foramen. No evidence of retropulsion Review of the MIP images confirms the above findings. IMPRESSION: Positive for subsegmental pulmonary embolus in the right lower lobe. Ill-defined masslike area seen in the right lobe of the liver with adjacent intrahepatic biliary ductal dilation. 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 Possible soft tissue extension into the left neural foramen. Finding could be further evaluated with contrast enhanced MRI of the spine. Critical Value/emergent results were called by telephone at the time of interpretation on 05/13/2021 at 2:00 pm [facility] verbally [facility] these results. Electronically Signed: By: LYetta GlassmanMD On: 05/13/2021 14:01   MR BRAIN W WO CONTRAST  Result Date: 05/16/2021 CLINICAL  DATA:  Cancer of unknown primary, staging metastatic cancer. EXAM: MRI HEAD WITHOUT AND WITH CONTRAST TECHNIQUE: Multiplanar, multiecho pulse sequences of the brain and surrounding structures were obtained without and with intravenous contrast. CONTRAST:  44m GADAVIST GADOBUTROL 1 MMOL/ML IV SOLN COMPARISON:  None. FINDINGS: Brain: There is no evidence of an acute infarct, intracranial hemorrhage, mass, midline shift, or extra-axial fluid collection. Mild cerebral atrophy is within normal limits for age. No significant  white matter disease is seen. No abnormal enhancement is identified. Vascular: Major intracranial vascular flow voids are preserved. Skull and upper cervical spine: Unremarkable bone marrow signal. Sinuses/Orbits: Unremarkable orbits. Paranasal sinuses and mastoid air cells are clear. Other: None. IMPRESSION: Unremarkable appearance of the brain for age. No evidence of intracranial metastases. Electronically Signed   By: ALogan BoresM.D.   On: 05/16/2021 17:38   MR CERVICAL SPINE W WO CONTRAST  Result Date: 05/14/2021 CLINICAL DATA:  Metastatic disease evaluation EXAM: MRI CERVICAL, THORACIC AND LUMBAR SPINE WITHOUT AND WITH CONTRAST TECHNIQUE: Multiplanar and multiecho pulse sequences of the cervical spine, to include the craniocervical junction and cervicothoracic junction, and thoracic and lumbar spine, were obtained without and with intravenous contrast. CONTRAST:  662mGADAVIST GADOBUTROL 1 MMOL/ML IV SOLN COMPARISON:  None. FINDINGS: MRI CERVICAL SPINE FINDINGS Alignment: Physiologic. Vertebrae: No fracture, evidence of discitis, or bone lesion. Cord: Normal signal and morphology. Posterior Fossa, vertebral arteries, paraspinal tissues: Negative. Disc levels: C1-2: Unremarkable C2-3: Small central disc protrusion. There is no spinal canal stenosis. No neural foraminal stenosis. C3-4: Normal disc space and facet joints. There is no spinal canal stenosis. No neural foraminal stenosis. C4-5: Normal disc space and facet joints. There is no spinal canal stenosis. No neural foraminal stenosis. C5-6: Small central disc protrusion. There is no spinal canal stenosis. No neural foraminal stenosis. C6-7: Normal disc space and facet joints. There is no spinal canal stenosis. No neural foraminal stenosis. C7-T1: Normal disc space and facet joints. There is no spinal canal stenosis. No neural foraminal stenosis. MRI THORACIC SPINE FINDINGS Alignment:  Physiologic. Vertebrae: Diffusely abnormal bone marrow signal at T4  with split type fracture and expansion of the left pedicle. There is diffuse abnormal contrast enhancement. Approximately 25% height loss. Cord:  Normal signal and morphology. Paraspinal and other soft tissues: Negative. Disc levels: MRI LUMBAR SPINE FINDINGS Segmentation:  Standard. Alignment:  Physiologic. Vertebrae:  Hemangioma at L1.  No other osseous lesion. Conus medullaris and cauda equina: Conus extends to the L1 level. Conus and cauda equina appear normal. Paraspinal and other soft tissues: Negative Disc levels: L1-L2: Normal disc space and facet joints. No spinal canal stenosis. No neural foraminal stenosis. L2-L3: Normal disc space and facet joints. No spinal canal stenosis. No neural foraminal stenosis. L3-L4: Small disc bulge. No spinal canal stenosis. No neural foraminal stenosis. L4-L5: Small disc bulge with superimposed central protrusion. Left-greater-than-right lateral recess narrowing without central spinal canal stenosis. No neural foraminal stenosis. L5-S1: Left asymmetric disc bulge. Left lateral recess narrowing without central spinal canal stenosis. Moderate left neural foraminal stenosis. Visualized sacrum: Normal. IMPRESSION: 1. Pathologic split type fracture of T4 with approximately 25% height loss, likely due to metastatic disease. 2. No other evidence of metastatic disease of the cervical, thoracic or lumbar spine. 3. Moderate left L5-S1 neural foraminal stenosis. 4. Left-greater-than-right lateral recess narrowing at L4-5 and L5-S1. Electronically Signed   By: KeUlyses Jarred.D.   On: 05/14/2021 19:32   MR THORACIC SPINE W WO CONTRAST  Result Date: 06/04/2021 CLINICAL  DATA:  Initial evaluation for metastatic disease, T4 lesion, SRS treatment planning. EXAM: MRI THORACIC WITHOUT AND WITH CONTRAST TECHNIQUE: Multiplanar and multiecho pulse sequences of the thoracic spine were obtained without and with intravenous contrast. An SRS protocol was utilized. CONTRAST:  29m MULTIHANCE  GADOBENATE DIMEGLUMINE 529 MG/ML IV SOLN COMPARISON:  Recent MRI from 05/14/2021. FINDINGS: Alignment: Stable alignment with mild diffuse dextroscoliosis. Vertebral bodies otherwise normally aligned with preservation of the normal thoracic kyphosis. No interval listhesis. Vertebrae: Metastatic lesion involving the T4 vertebral body again seen. Associated pathologic compression fracture with up to 50% central height loss. Tumor extends posteriorly to involve the left greater than right pedicles. Epidural extension with tumor extending into the left greater than right ventral epidural space (series 22, image 30). Resultant mild-to-moderate spinal stenosis with mild flattening of the ventral cord, stable to perhaps slightly progressed as compared to prior. Mild involvement of the adjacent left T3-4 and T4-5 neural foramina (series 17, image 11). Slight extra osseous extension into the adjacent paraspinous soft tissues noted as well (series 22, image 32). No other new osseous metastatic disease elsewhere within the thoracic spine. Small benign hemangiomata involving the T10 and L1 vertebral bodies noted, stable. Cord: Mild flattening at the level of T4 related to the epidural tumor but no cord signal changes. Normal signal morphology seen elsewhere throughout the visualized cord. Paraspinal and other soft tissues: Visualized paraspinous soft tissues demonstrate no significant finding. Disc levels: No other significant disc pathology within the thoracic spine. No other stenosis or neural impingement. IMPRESSION: 1. Metastatic lesion involving the T4 vertebral body with associated pathologic compression fracture. Epidural extension with resultant mild-to-moderate spinal stenosis with mild flattening of the ventral cord. No cord signal changes. Examination [facility]. 2. No other new osseous metastatic disease identified elsewhere within the thoracic spine. Electronically Signed   By:  BJeannine BogaM.D.   On: 06/04/2021 01:39   MR THORACIC SPINE W WO CONTRAST  Result Date: 05/14/2021 CLINICAL DATA:  Metastatic disease evaluation EXAM: MRI CERVICAL, THORACIC AND LUMBAR SPINE WITHOUT AND WITH CONTRAST TECHNIQUE: Multiplanar and multiecho pulse sequences of the cervical spine, to include the craniocervical junction and cervicothoracic junction, and thoracic and lumbar spine, were obtained without and with intravenous contrast. CONTRAST:  647mGADAVIST GADOBUTROL 1 MMOL/ML IV SOLN COMPARISON:  None. FINDINGS: MRI CERVICAL SPINE FINDINGS Alignment: Physiologic. Vertebrae: No fracture, evidence of discitis, or bone lesion. Cord: Normal signal and morphology. Posterior Fossa, vertebral arteries, paraspinal tissues: Negative. Disc levels: C1-2: Unremarkable C2-3: Small central disc protrusion. There is no spinal canal stenosis. No neural foraminal stenosis. C3-4: Normal disc space and facet joints. There is no spinal canal stenosis. No neural foraminal stenosis. C4-5: Normal disc space and facet joints. There is no spinal canal stenosis. No neural foraminal stenosis. C5-6: Small central disc protrusion. There is no spinal canal stenosis. No neural foraminal stenosis. C6-7: Normal disc space and facet joints. There is no spinal canal stenosis. No neural foraminal stenosis. C7-T1: Normal disc space and facet joints. There is no spinal canal stenosis. No neural foraminal stenosis. MRI THORACIC SPINE FINDINGS Alignment:  Physiologic. Vertebrae: Diffusely abnormal bone marrow signal at T4 with split type fracture and expansion of the left pedicle. There is diffuse abnormal contrast enhancement. Approximately 25% height loss. Cord:  Normal signal and morphology. Paraspinal and other soft tissues: Negative. Disc levels: MRI LUMBAR SPINE FINDINGS Segmentation:  Standard. Alignment:  Physiologic. Vertebrae:  Hemangioma at L1.  No other osseous  lesion. Conus medullaris and cauda equina: Conus extends to  the L1 level. Conus and cauda equina appear normal. Paraspinal and other soft tissues: Negative Disc levels: L1-L2: Normal disc space and facet joints. No spinal canal stenosis. No neural foraminal stenosis. L2-L3: Normal disc space and facet joints. No spinal canal stenosis. No neural foraminal stenosis. L3-L4: Small disc bulge. No spinal canal stenosis. No neural foraminal stenosis. L4-L5: Small disc bulge with superimposed central protrusion. Left-greater-than-right lateral recess narrowing without central spinal canal stenosis. No neural foraminal stenosis. L5-S1: Left asymmetric disc bulge. Left lateral recess narrowing without central spinal canal stenosis. Moderate left neural foraminal stenosis. Visualized sacrum: Normal. IMPRESSION: 1. Pathologic split type fracture of T4 with approximately 25% height loss, likely due to metastatic disease. 2. No other evidence of metastatic disease of the cervical, thoracic or lumbar spine. 3. Moderate left L5-S1 neural foraminal stenosis. 4. Left-greater-than-right lateral recess narrowing at L4-5 and L5-S1. Electronically Signed   By: Ulyses Jarred M.D.   On: 05/14/2021 19:32   MR Lumbar Spine W Wo Contrast  Result Date: 05/14/2021 CLINICAL DATA:  Metastatic disease evaluation EXAM: MRI CERVICAL, THORACIC AND LUMBAR SPINE WITHOUT AND WITH CONTRAST TECHNIQUE: Multiplanar and multiecho pulse sequences of the cervical spine, to include the craniocervical junction and cervicothoracic junction, and thoracic and lumbar spine, were obtained without and with intravenous contrast. CONTRAST:  29m GADAVIST GADOBUTROL 1 MMOL/ML IV SOLN COMPARISON:  None. FINDINGS: MRI CERVICAL SPINE FINDINGS Alignment: Physiologic. Vertebrae: No fracture, evidence of discitis, or bone lesion. Cord: Normal signal and morphology. Posterior Fossa, vertebral arteries, paraspinal tissues: Negative. Disc levels: C1-2: Unremarkable C2-3: Small central disc protrusion. There is no spinal canal stenosis.  No neural foraminal stenosis. C3-4: Normal disc space and facet joints. There is no spinal canal stenosis. No neural foraminal stenosis. C4-5: Normal disc space and facet joints. There is no spinal canal stenosis. No neural foraminal stenosis. C5-6: Small central disc protrusion. There is no spinal canal stenosis. No neural foraminal stenosis. C6-7: Normal disc space and facet joints. There is no spinal canal stenosis. No neural foraminal stenosis. C7-T1: Normal disc space and facet joints. There is no spinal canal stenosis. No neural foraminal stenosis. MRI THORACIC SPINE FINDINGS Alignment:  Physiologic. Vertebrae: Diffusely abnormal bone marrow signal at T4 with split type fracture and expansion of the left pedicle. There is diffuse abnormal contrast enhancement. Approximately 25% height loss. Cord:  Normal signal and morphology. Paraspinal and other soft tissues: Negative. Disc levels: MRI LUMBAR SPINE FINDINGS Segmentation:  Standard. Alignment:  Physiologic. Vertebrae:  Hemangioma at L1.  No other osseous lesion. Conus medullaris and cauda equina: Conus extends to the L1 level. Conus and cauda equina appear normal. Paraspinal and other soft tissues: Negative Disc levels: L1-L2: Normal disc space and facet joints. No spinal canal stenosis. No neural foraminal stenosis. L2-L3: Normal disc space and facet joints. No spinal canal stenosis. No neural foraminal stenosis. L3-L4: Small disc bulge. No spinal canal stenosis. No neural foraminal stenosis. L4-L5: Small disc bulge with superimposed central protrusion. Left-greater-than-right lateral recess narrowing without central spinal canal stenosis. No neural foraminal stenosis. L5-S1: Left asymmetric disc bulge. Left lateral recess narrowing without central spinal canal stenosis. Moderate left neural foraminal stenosis. Visualized sacrum: Normal. IMPRESSION: 1. Pathologic split type fracture of T4 with approximately 25% height loss, likely due to metastatic disease.  2. No other evidence of metastatic disease of the cervical, thoracic or lumbar spine. 3. Moderate left L5-S1 neural foraminal stenosis. 4. Left-greater-than-right lateral recess narrowing  at L4-5 and L5-S1. Electronically Signed   By: Ulyses Jarred M.D.   On: 05/14/2021 19:32   US BIOPSY (LIVER)  Result Date: 05/15/2021 INDICATION: 65 year old with evidence of metastatic disease. Patient has a suspicious hepatic lesion and needs a tissue diagnosis. EXAM: ULTRASOUND-GUIDED LIVER LESION BIOPSY MEDICATIONS: Moderate sedation ANESTHESIA/SEDATION: Moderate (conscious) sedation was employed during this procedure. A total of Versed 2.0 mg and Fentanyl 100 mcg was administered intravenously. Moderate Sedation Time: 13 minutes. The patient's level of consciousness and vital signs were monitored continuously by radiology nursing throughout the procedure under my direct supervision. FLUOROSCOPY TIME:  None COMPLICATIONS: None immediate. PROCEDURE: Informed written consent was obtained from the patient after a thorough discussion of the procedural risks, benefits and alternatives. All questions were addressed. Translator was used. A timeout was performed prior to the initiation of the procedure. Liver was evaluated with ultrasound. Lesions in the right hepatic lobe identified. The skin was prepped with chlorhexidine and sterile field was created. Maximal barrier sterile technique was utilized including caps, mask, sterile gowns, sterile gloves, sterile drape, hand hygiene and skin antiseptic. Skin was anesthetized using 1% lidocaine. Small incision was made. Using ultrasound guidance, 17 gauge coaxial needle was directed into the right hepatic lobe and into a hypoechoic lesion. Total of 3 core biopsies were obtained with an 18 gauge device. Specimens placed in formalin. Gel-Foam slurry was injected along the needle tract. Bandage placed over the puncture site. FINDINGS: Lateral right hepatic lobe has a nodular contour with a  geographic area of decreased echogenicity. There is at least 1 well-defined hypoechoic lesion in this area. This hypoechoic lesion was successfully targeted for biopsy. Needle position was confirmed within the lesion. Trace perihepatic ascites noted before and after the procedure. IMPRESSION: Ultrasound-guided core biopsies of a right hepatic lesion. Electronically Signed   By: Markus Daft M.D.   On: 05/15/2021 17:16   IR IMAGING GUIDED PORT INSERTION  Result Date: 05/19/2021 CLINICAL DATA:  Metastatic cancer, unknown primary EXAM: RIGHT INTERNAL JUGULAR SINGLE LUMEN POWER PORT CATHETER INSERTION Date:  05/19/2021 05/19/2021 1:20 pm Radiologist:  Jerilynn Mages. Daryll Brod, MD Guidance:  Ultrasound and fluoroscopic MEDICATIONS: 1% lidocaine local ANESTHESIA/SEDATION: Versed 2.0 mg IV; Fentanyl 100 mcg IV; Moderate Sedation Time:  22 minutes The patient was continuously monitored during the procedure by the interventional radiology nurse under my direct supervision. FLUOROSCOPY TIME:  0 minutes, 36 seconds (4.0 mGy) COMPLICATIONS: None immediate. CONTRAST:  None. PROCEDURE: Informed consent was obtained from the patient following explanation of the procedure, risks, benefits and alternatives. The patient understands, agrees and consents for the procedure. All questions were addressed. A time out was performed. Maximal barrier sterile technique utilized including caps, mask, sterile gowns, sterile gloves, large sterile drape, hand hygiene, and 2% chlorhexidine scrub. Under sterile conditions and local anesthesia, right internal jugular micropuncture venous access was performed. Access was performed with ultrasound. Images were obtained for documentation of the patent right internal jugular vein. A guide wire was inserted followed by a transitional dilator. This allowed insertion of a guide wire and catheter into the IVC. Measurements were obtained from the SVC / RA junction back to the right IJ venotomy site. In the right  infraclavicular chest, a subcutaneous pocket was created over the second anterior rib. This was done under sterile conditions and local anesthesia. 1% lidocaine with epinephrine was utilized for this. A 2.5 cm incision was made in the skin. Blunt dissection was performed to create a subcutaneous pocket over the right pectoralis major  muscle. The pocket was flushed with saline vigorously. There was adequate hemostasis. The port catheter was assembled and checked for leakage. The port catheter was secured in the pocket with two retention sutures. The tubing was tunneled subcutaneously to the right venotomy site and inserted into the SVC/RA junction through a valved peel-away sheath. Position was confirmed with fluoroscopy. Images were obtained for documentation. The patient tolerated the procedure well. No immediate complications. Incisions were closed in a two layer fashion with 4 - 0 Vicryl suture. Dermabond was applied to the skin. The port catheter was accessed, blood was aspirated followed by saline and heparin flushes. Needle was removed. A dry sterile dressing was applied. IMPRESSION: Ultrasound and fluoroscopically guided right internal jugular single lumen power port catheter insertion. Tip in the SVC/RA junction. Catheter ready for use. Electronically Signed   By: Jerilynn Mages.  Shick M.D.   On: 05/19/2021 13:48   VAS [place] LOWER EXTREMITY VENOUS (DVT)  Result Date: 05/14/2021  Lower Venous DVT Study Patient Name:  [facility]  Date of Exam:   05/14/2021 Medical Rec #: [id number]   Accession #:    [id number] Date of Birth: 12/15/1955  Patient Gender: M Patient Age:   64Y Exam Location:  [facility] Procedure:      VAS [place] LOWER EXTREMITY VENOUS (DVT) Referring Phys: Wendee Beavers --------------------------------------------------------------------------------  Indications: Pulmonary embolism.  Anticoagulation: Heparin. Comparison Study: No prior studies. Performing Technologist: Darlin Coco RDMS,RVT  Examination  Guidelines: A complete evaluation includes B-mode imaging, spectral Doppler, color Doppler, and power Doppler as needed of all accessible portions of each vessel. Bilateral testing is considered an integral part of a complete examination. Limited examinations for reoccurring indications may be performed as noted. The reflux portion of the exam is performed with the patient in reverse Trendelenburg.  +---------+---------------+---------+-----------+----------+--------------+ RIGHT    CompressibilityPhasicitySpontaneityPropertiesThrombus Aging +---------+---------------+---------+-----------+----------+--------------+ CFV      Full           Yes      Yes                                 +---------+---------------+---------+-----------+----------+--------------+ SFJ      Full                                                        +---------+---------------+---------+-----------+----------+--------------+ FV Prox  Full                                                        +---------+---------------+---------+-----------+----------+--------------+ FV Mid   Full                                                        +---------+---------------+---------+-----------+----------+--------------+ FV DistalFull                                                        +---------+---------------+---------+-----------+----------+--------------+  PFV      Full                                                        +---------+---------------+---------+-----------+----------+--------------+ POP      Full           Yes      Yes                                 +---------+---------------+---------+-----------+----------+--------------+ PTV      Full                                                        +---------+---------------+---------+-----------+----------+--------------+ PERO     Full                                                         +---------+---------------+---------+-----------+----------+--------------+   +---------+---------------+---------+-----------+----------+--------------+ LEFT     CompressibilityPhasicitySpontaneityPropertiesThrombus Aging +---------+---------------+---------+-----------+----------+--------------+ CFV      Full           Yes      Yes                                 +---------+---------------+---------+-----------+----------+--------------+ SFJ      Full                                                        +---------+---------------+---------+-----------+----------+--------------+ FV Prox  Full                                                        +---------+---------------+---------+-----------+----------+--------------+ FV Mid   Full                                                        +---------+---------------+---------+-----------+----------+--------------+ FV DistalFull                                                        +---------+---------------+---------+-----------+----------+--------------+ PFV      Full                                                        +---------+---------------+---------+-----------+----------+--------------+  POP      Full           Yes      Yes                                 +---------+---------------+---------+-----------+----------+--------------+ PTV      Full                                                        +---------+---------------+---------+-----------+----------+--------------+ PERO     Full                                                        +---------+---------------+---------+-----------+----------+--------------+     Summary: RIGHT: - There is no evidence of deep vein thrombosis in the lower extremity.  - No cystic structure found in the popliteal fossa.  LEFT: - There is no evidence of deep vein thrombosis in the lower extremity.  - No cystic structure found in the popliteal fossa.   *See table(s) above for measurements and observations. Electronically signed by Servando Snare MD on 05/14/2021 at 3:49:43 PM.    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  The patient is currently undergoing chemotherapy and immunotherapy.  He received chemotherapy and immunotherapy (cisplatin, Gemzar, Imfinzi) on 05/29/2021.  He received chemotherapy only on 06/05/2021 (cisplatin and Gemzar).  The patient developed a diffuse maculopapular rash on his trunk.  No known exposures, lotions, detergents, or new medications.  This may be a immunotherapy induced drug rash (imfinzi).   Due to the extensive rash, the patient likely needs oral steroids.  I have sent a prescription for Medrol Dosepak to the patient's pharmacy.  I reviewed to take this first thing in the morning as it may cause insomnia.  Also discussed that it may cause the patient to feel flushed/hot/hungry.  I also encouraged the patient to take an antihistamine daily as well.  Discussed that Benadryl may make the patient feel drowsy and not to drive with taking this.  If this causes too much drowsiness, he may use Allegra or Claritin instead.  For symptomatic relief, he may also use Benadryl cream, hydrocortisone cream, and/or Aveeno oatmeal lotion.  Per chart review, it appears that the patient did not receive his Ziextenzo injection with his first cycle of treatment.  It does not appear that this was scheduled.  He is out of the window to receive the injection today. Fortunately, his total white blood cell count is only slightly low at 3.6 and his ANC is within normal limit at 2.4.  The patient denies any signs or symptoms of infection today.  Of course, I advised the patient to call us immediately if he developed any signs or symptoms of infection.  We will have to monitor his blood work closely weekly.  His platelet count is also slightly low today at 76,000.  The patient is  on Eliquis due to a recent pulmonary embolism.  The patient denies any abnormal bleeding or bruising.  Discussed that the patient may  continue to take his Eliquis due to his platelet count being greater than 50,000.  However, if the patient develops any abnormal bleeding or bruising I encouraged them to call us sooner to recheck his blood work.  The patient sees Dr. Alvy Bimler for a follow-up visit next week. I will reach out to her so she is familiar with what we spoke about today and she can make decisions moving forward about future immunotherapy use.  The patient was advised to call immediately if she has any concerning symptoms in the interval. The patient voices understanding of current disease status and treatment options and is in agreement with the current care plan. All questions were answered. The patient knows to call the clinic with any problems, questions or concerns. We can certainly see the patient much sooner if necessary   No orders of the defined types were placed in this encounter.    The total time spent in the appointment was 20-29 minutes.   Param Capri L Bessie Boyte, PA-C 06/10/21

## 2021-06-16 ENCOUNTER — Other Ambulatory Visit: Payer: Self-pay | Admitting: Hematology and Oncology

## 2021-06-16 ENCOUNTER — Encounter: Payer: Self-pay | Admitting: Hematology and Oncology

## 2021-06-16 NOTE — Progress Notes (Signed)
                                                                                                                                                             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 He did develop a rash following immunotherapy infusion. He will continue to follow up with Dr. Alvy Bimler regarding this.  Plan: The patient will receive a call in about one month from the radiation oncology department and begin follow up and case discussion with the brain and spine oncology conference. He will continue follow up with Dr. Alvy Bimler as well.   ________________________________________________    Carola Rhine, [facility]

## 2021-06-19 ENCOUNTER — Encounter: Payer: Self-pay | Admitting: Hematology and Oncology

## 2021-06-19 ENCOUNTER — Other Ambulatory Visit (HOSPITAL_COMMUNITY): Payer: Self-pay

## 2021-06-19 ENCOUNTER — Other Ambulatory Visit: Payer: Self-pay

## 2021-06-19 ENCOUNTER — Inpatient Hospital Stay (HOSPITAL_BASED_OUTPATIENT_CLINIC_OR_DEPARTMENT_OTHER): Payer: Managed Care, Other (non HMO) | Admitting: Hematology and Oncology

## 2021-06-19 ENCOUNTER — Inpatient Hospital Stay: Payer: Managed Care, Other (non HMO) | Attending: Hematology and Oncology

## 2021-06-19 VITALS — BP 120/63 | HR 67 | Temp 98.0°F | Resp 18 | Ht 62.0 in | Wt 142.4 lb

## 2021-06-19 DIAGNOSIS — R7989 Other specified abnormal findings of blood chemistry: Secondary | ICD-10-CM

## 2021-06-19 DIAGNOSIS — C7951 Secondary malignant neoplasm of bone: Secondary | ICD-10-CM | POA: Insufficient documentation

## 2021-06-19 DIAGNOSIS — Z7901 Long term (current) use of anticoagulants: Secondary | ICD-10-CM | POA: Insufficient documentation

## 2021-06-19 DIAGNOSIS — Z86711 Personal history of pulmonary embolism: Secondary | ICD-10-CM | POA: Insufficient documentation

## 2021-06-19 DIAGNOSIS — Z5112 Encounter for antineoplastic immunotherapy: Secondary | ICD-10-CM | POA: Diagnosis present

## 2021-06-19 DIAGNOSIS — I2699 Other pulmonary embolism without acute cor pulmonale: Secondary | ICD-10-CM

## 2021-06-19 DIAGNOSIS — Z599 Problem related to housing and economic circumstances, unspecified: Secondary | ICD-10-CM

## 2021-06-19 DIAGNOSIS — Z79899 Other long term (current) drug therapy: Secondary | ICD-10-CM | POA: Insufficient documentation

## 2021-06-19 DIAGNOSIS — C249 Malignant neoplasm of biliary tract, unspecified: Secondary | ICD-10-CM | POA: Diagnosis present

## 2021-06-19 DIAGNOSIS — K5909 Other constipation: Secondary | ICD-10-CM

## 2021-06-19 DIAGNOSIS — Z5189 Encounter for other specified aftercare: Secondary | ICD-10-CM | POA: Insufficient documentation

## 2021-06-19 DIAGNOSIS — Z5111 Encounter for antineoplastic chemotherapy: Secondary | ICD-10-CM | POA: Diagnosis present

## 2021-06-19 DIAGNOSIS — M8458XA Pathological fracture in neoplastic disease, other specified site, initial encounter for fracture: Secondary | ICD-10-CM

## 2021-06-19 LAB — TSH: TSH: 1.157 u[IU]/mL (ref 0.320–4.118)

## 2021-06-19 LAB — T4, FREE: Free T4: 0.88 ng/dL (ref 0.61–1.12)

## 2021-06-19 LAB — CMP (CANCER CENTER ONLY)
ALT: 69 U/L — ABNORMAL HIGH (ref 0–44)
AST: 29 U/L (ref 15–41)
Albumin: 3.4 g/dL — ABNORMAL LOW (ref 3.5–5.0)
Alkaline Phosphatase: 326 U/L — ABNORMAL HIGH (ref 38–126)
Anion gap: 10 (ref 5–15)
BUN: 13 mg/dL (ref 8–23)
CO2: 26 mmol/L (ref 22–32)
Calcium: 9.7 mg/dL (ref 8.9–10.3)
Chloride: 104 mmol/L (ref 98–111)
Creatinine: 0.85 mg/dL (ref 0.61–1.24)
GFR, Estimated: >60 mL/min (ref >60)
Glucose, Bld: 115 mg/dL — ABNORMAL HIGH (ref 70–99)
Potassium: 4.6 mmol/L (ref 3.5–5.1)
Sodium: 140 mmol/L (ref 135–145)
Total Bilirubin: 0.3 mg/dL (ref 0.3–1.2)
Total Protein: 6.6 g/dL (ref 6.5–8.1)

## 2021-06-19 LAB — CBC WITH DIFFERENTIAL (CANCER CENTER ONLY)
Abs Immature Granulocytes: 0.05 10*3/uL (ref 0.00–0.07)
Basophils Absolute: 0 10*3/uL (ref 0.0–0.1)
Basophils Relative: 1 %
Eosinophils Absolute: 0.1 10*3/uL (ref 0.0–0.5)
Eosinophils Relative: 1 %
HCT: 38.7 % — ABNORMAL LOW (ref 39.0–52.0)
Hemoglobin: 13.3 g/dL (ref 13.0–17.0)
Immature Granulocytes: 1 %
Lymphocytes Relative: 33 %
Lymphs Abs: 1.4 10*3/uL (ref 0.7–4.0)
MCH: 31.5 pg (ref 26.0–34.0)
MCHC: 34.4 g/dL (ref 30.0–36.0)
MCV: 91.7 fL (ref 80.0–100.0)
Monocytes Absolute: 0.9 10*3/uL (ref 0.1–1.0)
Monocytes Relative: 23 %
Neutro Abs: 1.7 10*3/uL (ref 1.7–7.7)
Neutrophils Relative %: 41 %
Platelet Count: 359 10*3/uL (ref 150–400)
RBC: 4.22 MIL/uL (ref 4.22–5.81)
RDW: 16.1 % — ABNORMAL HIGH (ref 11.5–15.5)
WBC Count: 4.1 10*3/uL (ref 4.0–10.5)
nRBC: 0 % (ref 0.0–0.2)

## 2021-06-19 LAB — MAGNESIUM: Magnesium: 2.2 mg/dL (ref 1.7–2.4)

## 2021-06-19 MED ORDER — OXYCODONE HCL 5 MG PO TABS
5.0000 mg | ORAL_TABLET | ORAL | 0 refills | Status: DC | PRN
  Filled 2021-06-19: qty 60, 10d supply, fill #0

## 2021-06-19 NOTE — Assessment & Plan Note (Signed)
His elevated liver enzymes are due to disease, improved since we started him on treatment Observe closely Since his bilirubin is within normal range, no dose adjustment is required

## 2021-06-19 NOTE — Assessment & Plan Note (Signed)
He has intermittent pain in his back from his disease I refilled his prescription oxycodone I warned him about risk of side effects and we discussed narcotic refill policy

## 2021-06-19 NOTE — Progress Notes (Signed)
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[place]) Overall, he tolerated treatment very well without major side effects The cause of his recent skin rash is unknown I will add Pepcid to his future treatment as precautionary measure in case he is allergic to one of the component of his treatment I am encouraged to see significant improvement of his liver enzymes and overall health with weight gain I felt that he is responding well to treatment I recommend 3 cycles of therapy before repeating CT imaging  Pulmonary embolism ([place]) He will continue anticoagulation therapy indefinitely  Elevated LFTs His elevated liver enzymes are due to disease, improved since we started him on treatment Observe closely Since his bilirubin is within normal range, no dose adjustment is required  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   All questions were answered. The patient knows to call the clinic with any problems, questions or concerns. The total time spent in the appointment was 30 minutes encounter with patients including review of chart and various tests results, discussions about plan of care and coordination of care plan   Heath Lark, MD 06/19/2021 1:53 PM  INTERVAL HISTORY: Please see below for problem oriented charting. he returns for treatment follow-up for cycle 2 of gemcitabine, cisplatin with [facility] interpreter is  present He is here with his son He was seen recently for skin rash after cycle 2 of therapy that has since resolved He denies side effects from anticoagulation therapy such as bleeding No recent nausea, vomiting or changes in bowel habits He has gained some weight He has persistent back pain and ran out of pain medicine recently  REVIEW OF SYSTEMS:   Constitutional: Denies fevers, chills or abnormal weight loss Eyes: Denies blurriness of vision Ears, nose, mouth, throat, and face: Denies mucositis or sore throat Respiratory: Denies cough, dyspnea or wheezes Cardiovascular: Denies palpitation, chest discomfort or lower extremity swelling Gastrointestinal:  Denies nausea, heartburn or change in bowel habits Lymphatics: Denies new lymphadenopathy or easy bruising Neurological:Denies numbness, tingling or new weaknesses Behavioral/Psych: Mood is stable, no new changes  All other systems were reviewed with the patient and are negative.  I have reviewed the past medical history, past surgical history, social history and family history with the patient and they are unchanged from previous note.  ALLERGIES:  has No Known Allergies.  MEDICATIONS:  Current Outpatient Medications  Medication Sig Dispense Refill   oxyCODONE (OXY IR/ROXICODONE) 5 MG immediate release tablet Take 1 tablet (5 mg total) by mouth every 4 hours as needed for severe pain. 60 tablet 0   apixaban (ELIQUIS) 5 MG TABS tablet Take 1 tablet (5 mg total) by mouth 2 (two) times daily. 60 tablet 1   lidocaine-prilocaine (EMLA) cream Apply to affected area once 30 g 3   ondansetron (ZOFRAN) 8 MG tablet Take 1 tablet by mouth every 8 hours as needed. 30 tablet 1   prochlorperazine (COMPAZINE)  10 MG tablet Take 1 tablet by mouth every 6 hours as needed (Nausea or vomiting). 30 tablet 1   senna-docusate (SENOKOT-S) 8.6-50 MG tablet Take 1 tablet by mouth 2 (two) times daily as needed for mild constipation or moderate constipation.      No current facility-administered medications for this visit.    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 He underwent liver biopsy   05/14/2021 Imaging   RIGHT:  - There is no evidence of deep vein thrombosis in the lower extremity.     - No cystic structure found in the popliteal fossa.     LEFT:  - There is no evidence of deep vein thrombosis in the lower extremity.     - No cystic structure found in the popliteal fossa.    05/14/2021 Imaging   1. Pathologic split type fracture of T4 with approximately 25% height loss, likely due to metastatic disease. 2. No other evidence of metastatic disease of the cervical, thoracic or lumbar spine. 3. Moderate left L5-S1 neural foraminal stenosis. 4. Left-greater-than-right lateral recess narrowing at L4-5 and L5-S1.     05/14/2021 Imaging   1. 5 cm ill-defined irregular low-density lesion in the anterior right liver inferiorly is highly suspicious for primary or metastatic neoplasm. The lesion obliterates bile ducts in the central right liver resulting in right hepatic lobe biliary dilatation. Similarly, portal venous anatomy in the anterior right liver is involved along the superomedial margin of the lesion. Lesion should be amenable to tissue sampling. 2. Gallbladder wall is contracted and irregular in appearance adjacent to the lesion. Imaging features suggest that gallbladder changes are secondary although primary gallbladder neoplasm not excluded. 3. 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 No evidence for additional metastatic disease in the abdomen or pelvis. 4. Prostatomegaly. 5. Trace free fluid in the pelvis. 6. Aortic Atherosclerosis (ICD10-I70.0).   05/15/2021 Pathology Results   Clinical History: Right hepatic tumor and evidence for bone metastasis ([place])     FINAL MICROSCOPIC DIAGNOSIS:   A. LIVER, RIGHT, BIOPSY:  -  Adenocarcinoma  -  See comment   COMMENT:   By immunohistochemistry, the neoplastic cells are positive for cytokeratin 7, monoclonal and polyclonal CEA with patchy positivity for cytokeratin 20.  The tumor cells are negative for TTF-1, AFP, prostein, PSA and CDX2.  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     05/15/2021 Procedure   Ultrasound-guided core biopsies of a right hepatic lesion.   05/16/2021 Imaging   MRI brain  Unremarkable appearance of the brain for age. No evidence of intracranial metastases.     05/16/2021 Imaging   Ct chest  1. Study is once again positive for subsegmental sized emboli in the right lower lobe. No larger central, lobar or segmental sized emboli are noted. 2. Lytic lesion in T4 with pathologic fracture, similar to the prior study. This was better depicted on recent thoracic spine MRI. 3. Aortic atherosclerosis, in addition to left main and 3 vessel coronary artery disease. There is also mild ectasia of the ascending thoracic aorta (4.0 cm in diameter).    05/19/2021 Procedure   Ultrasound and fluoroscopically guided right internal jugular single lumen power port catheter insertion. Tip in the SVC/RA junction. Catheter ready for use.  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[place])   05/20/2021 Cancer Staging   Staging form: Intrahepatic Bile Duct, AJCC 8th Edition - Clinical stage from 05/20/2021: Stage IV (cT2, cN0, pM1) - Signed by Heath Lark, MD on 05/20/2021 Stage prefix: Initial diagnosis   05/29/2021 -  Chemotherapy    Patient is on Treatment Plan: BILIARY TRACT CISPLATIN + GEMCITABINE D1,8 Q21D         PHYSICAL EXAMINATION: ECOG PERFORMANCE STATUS: 1 - Symptomatic but completely ambulatory  Vitals:   06/19/21 1320  BP: 120/63  Pulse:  67  Resp: 18  Temp: 98 F (36.7 C)  SpO2: 100%   Filed Weights   06/19/21 1320  Weight: 142 lb 6.4 oz (64.6 kg)    GENERAL:alert, no distress and comfortable SKIN: Noted faded skin rash EYES: normal, Conjunctiva are pink and non-injected, sclera clear OROPHARYNX:no exudate, no erythema and lips, buccal mucosa, and tongue normal  NECK: supple, thyroid normal size, non-tender, without nodularity LYMPH:  no palpable lymphadenopathy in the cervical, axillary or inguinal LUNGS: clear to auscultation and percussion with normal breathing effort HEART: regular rate & rhythm and no murmurs and no lower extremity edema ABDOMEN:abdomen soft, non-tender and normal bowel sounds Musculoskeletal:no cyanosis of digits and no clubbing  NEURO: alert & oriented x 3 with fluent speech, no focal motor/sensory deficits  LABORATORY DATA:  I have reviewed the data as listed    Component Value Date/Time   NA 140 06/19/2021 1232   NA 145 (H) 04/28/2018 1054   K 4.6 06/19/2021 1232   CL 104 06/19/2021 1232   CO2 26 06/19/2021 1232   GLUCOSE 115 (H) 06/19/2021 1232   BUN 13 06/19/2021 1232   BUN 13 04/28/2018 1054   CREATININE 0.85 06/19/2021 1232   CALCIUM 9.7 06/19/2021 1232   PROT 6.6 06/19/2021 1232   PROT 6.8 04/28/2018 1054   ALBUMIN 3.4 (L) 06/19/2021 1232   ALBUMIN 4.4 04/28/2018 1054   AST 29 06/19/2021 1232   ALT 69 (H) 06/19/2021 1232   ALKPHOS 326 (H) 06/19/2021 1232   BILITOT 0.3 06/19/2021 1232   GFRNONAA >60 06/19/2021 1232   GFRAA >60 08/22/2018 0904    No results found for: SPEP, UPEP  Lab Results  Component Value Date   WBC 4.1 06/19/2021   NEUTROABS 1.7 06/19/2021   HGB 13.3 06/19/2021   HCT 38.7 (L) 06/19/2021   MCV 91.7 06/19/2021   PLT 359 06/19/2021      Chemistry      Component Value Date/Time   NA 140 06/19/2021 1232   NA 145 (H) 04/28/2018 1054   K 4.6 06/19/2021 1232   CL 104 06/19/2021 1232   CO2 26 06/19/2021 1232   BUN 13 06/19/2021 1232   BUN  13 04/28/2018 1054   CREATININE 0.85 06/19/2021 1232      Component Value Date/Time   CALCIUM 9.7 06/19/2021 1232   ALKPHOS 326 (H) 06/19/2021 1232   AST 29 06/19/2021 1232   ALT 69 (H) 06/19/2021 1232   BILITOT 0.3 06/19/2021 1232

## 2021-06-19 NOTE — Assessment & Plan Note (Signed)
He is currently on short-term disability and his family has helped him apply for Social Security and benefit I am pleased to see that he is able to get some financial navigation and help while on treatment

## 2021-06-19 NOTE — Assessment & Plan Note (Signed)
Overall, he tolerated treatment very well without major side effects The cause of his recent skin rash is unknown I will add Pepcid to his future treatment as precautionary measure in case he is allergic to one of the component of his treatment I am encouraged to see significant improvement of his liver enzymes and overall health with weight gain I felt that he is responding well to treatment I recommend 3 cycles of therapy before repeating CT imaging

## 2021-06-19 NOTE — Assessment & Plan Note (Signed)
He will continue anticoagulation therapy indefinitely 

## 2021-06-20 LAB — CANCER ANTIGEN 19-9: CA 19-9: 1627 U/mL — ABNORMAL HIGH (ref 0–35)

## 2021-06-22 ENCOUNTER — Other Ambulatory Visit: Payer: Self-pay | Admitting: Hematology and Oncology

## 2021-06-22 MED FILL — Fosaprepitant Dimeglumine For IV Infusion 150 MG (Base Eq): INTRAVENOUS | Qty: 5 | Status: AC

## 2021-06-22 MED FILL — Dexamethasone Sodium Phosphate Inj 100 MG/10ML: INTRAMUSCULAR | Qty: 1 | Status: AC

## 2021-06-23 ENCOUNTER — Other Ambulatory Visit: Payer: Self-pay

## 2021-06-23 ENCOUNTER — Inpatient Hospital Stay: Payer: Managed Care, Other (non HMO)

## 2021-06-23 VITALS — BP 135/88 | HR 63 | Temp 98.2°F | Resp 18 | Wt 144.5 lb

## 2021-06-23 DIAGNOSIS — C249 Malignant neoplasm of biliary tract, unspecified: Secondary | ICD-10-CM

## 2021-06-23 DIAGNOSIS — Z5112 Encounter for antineoplastic immunotherapy: Secondary | ICD-10-CM | POA: Diagnosis not present

## 2021-06-23 MED ORDER — FAMOTIDINE 20 MG IN NS 100 ML IVPB
20.0000 mg | Freq: Once | INTRAVENOUS | Status: AC
  Administered 2021-06-23: 20 mg via INTRAVENOUS
  Filled 2021-06-23: qty 100

## 2021-06-23 MED ORDER — SODIUM CHLORIDE 0.9 % IV SOLN: Freq: Once | INTRAVENOUS | Status: AC

## 2021-06-23 MED ORDER — PALONOSETRON HCL INJECTION 0.25 MG/5ML
0.2500 mg | Freq: Once | INTRAVENOUS | Status: AC
  Administered 2021-06-23: 0.25 mg via INTRAVENOUS
  Filled 2021-06-23: qty 5

## 2021-06-23 MED ORDER — MAGNESIUM SULFATE 2 GM/50ML IV SOLN
2.0000 g | Freq: Once | INTRAVENOUS | Status: AC
  Administered 2021-06-23: 2 g via INTRAVENOUS
  Filled 2021-06-23: qty 50

## 2021-06-23 MED ORDER — SODIUM CHLORIDE 0.9 % IV SOLN
1000.0000 mg/m2 | Freq: Once | INTRAVENOUS | Status: AC
  Administered 2021-06-23: 1672 mg via INTRAVENOUS
  Filled 2021-06-23: qty 43.97

## 2021-06-23 MED ORDER — SODIUM CHLORIDE 0.9 % IV SOLN
1500.0000 mg | Freq: Once | INTRAVENOUS | Status: AC
  Administered 2021-06-23: 1500 mg via INTRAVENOUS
  Filled 2021-06-23: qty 30

## 2021-06-23 MED ORDER — SODIUM CHLORIDE 0.9 % IV SOLN
25.0000 mg/m2 | Freq: Once | INTRAVENOUS | Status: AC
  Administered 2021-06-23: 42 mg via INTRAVENOUS
  Filled 2021-06-23: qty 42

## 2021-06-23 MED ORDER — SODIUM CHLORIDE 0.9 % IV SOLN
10.0000 mg | Freq: Once | INTRAVENOUS | Status: AC
  Administered 2021-06-23: 10 mg via INTRAVENOUS
  Filled 2021-06-23: qty 10

## 2021-06-23 MED ORDER — SODIUM CHLORIDE 0.9 % IV SOLN
150.0000 mg | Freq: Once | INTRAVENOUS | Status: AC
  Administered 2021-06-23: 150 mg via INTRAVENOUS
  Filled 2021-06-23: qty 150

## 2021-06-23 MED ORDER — POTASSIUM CHLORIDE IN NACL 20-0.9 MEQ/L-% IV SOLN
Freq: Once | INTRAVENOUS | Status: AC
  Filled 2021-06-23: qty 1000

## 2021-06-23 MED ORDER — SODIUM CHLORIDE 0.9% FLUSH
10.0000 mL | INTRAVENOUS | Status: DC | PRN
Start: 2021-06-23 — End: 2021-06-23
  Administered 2021-06-23: 10 mL

## 2021-06-23 MED ORDER — HEPARIN SOD (PORK) LOCK FLUSH 100 UNIT/ML IV SOLN
500.0000 [IU] | Freq: Once | INTRAVENOUS | Status: AC | PRN
  Administered 2021-06-23: 500 [IU]

## 2021-06-23 NOTE — Patient Instructions (Signed)
[facility] ONCOLOG[facility]  Discharge Instructions: Thank you for choosing [facility] to provide your oncology and hematology care.   If you have a lab appointment with the [facility], please go directly to the [facility] and check in at the registration area.   Wear comfortable clothing and clothing appropriate for easy access to any Portacath or PICC line.   We strive to give you quality time with your provider. You may need to reschedule your appointment if you arrive late (15 or more minutes).  Arriving late affects you and other patients whose appointments are after yours.  Also, if you miss three or more appointments without notifying the office, you may be dismissed from the clinic at the provider's discretion.      For prescription refill requests, have your pharmacy contact our office and allow 72 hours for refills to be completed.    Today you received the following chemotherapy and/or immunotherapy agents :  Imfinzi, Gemcitabine, Cisplatin.       To help prevent nausea and vomiting after your treatment, we encourage you to take your nausea medication as directed.  BELOW ARE SYMPTOMS THAT SHOULD BE REPORTED IMMEDIATELY: *FEVER GREATER THAN 100.4 F (38 C) OR HIGHER *CHILLS OR SWEATING *NAUSEA AND VOMITING THAT IS NOT CONTROLLED WITH YOUR NAUSEA MEDICATION *UNUSUAL SHORTNESS OF BREATH *UNUSUAL BRUISING OR BLEEDING *URINARY PROBLEMS (pain or burning when urinating, or frequent urination) *BOWEL PROBLEMS (unusual diarrhea, constipation, pain near the anus) TENDERNESS IN MOUTH AND THROAT WITH OR WITHOUT PRESENCE OF ULCERS (sore throat, sores in mouth, or a toothache) UNUSUAL RASH, SWELLING OR PAIN  UNUSUAL VAGINAL DISCHARGE OR ITCHING   Items with * indicate a potential emergency and should be followed up as soon as possible or go to the Emergency Department if any problems should occur.  Please show the CHEMOTHERAPY ALERT CARD or IMMUNOTHERAPY  ALERT CARD at check-in to the Emergency Department and triage nurse.  Should you have questions after your visit or need to cancel or reschedule your appointment, please contact [facility]  Dept: [phone]  and follow the prompts.  Office hours are 8:00 a.m. to 4:30 p.m. Monday - Friday. Please note that voicemails left after 4:00 p.m. may not be returned until the following business day.  We are closed weekends and major holidays. You have access to a nurse at all times for urgent questions. Please call the main number to the clinic Dept: [phone] and follow the prompts.   For any non-urgent questions, you may also contact your provider using MyChart. We now offer e-Visits for anyone 57 and older to request care online for non-urgent symptoms. For details visit [email or web address].   Also download the MyChart app! Go to the app store, search "MyChart", open the app, select [facility], and log in with your MyChart username and password.  Due to Covid, a mask is required upon entering the hospital/clinic. If you do not have a mask, one will be given to you upon arrival. For doctor visits, patients may have 1 support person aged 42 or older with them. For treatment visits, patients cannot have anyone with them due to current Covid guidelines and our immunocompromised population.

## 2021-06-26 ENCOUNTER — Encounter: Payer: Self-pay | Admitting: Hematology and Oncology

## 2021-06-26 ENCOUNTER — Other Ambulatory Visit: Payer: Self-pay

## 2021-06-26 ENCOUNTER — Inpatient Hospital Stay (HOSPITAL_BASED_OUTPATIENT_CLINIC_OR_DEPARTMENT_OTHER): Payer: Managed Care, Other (non HMO) | Admitting: Hematology and Oncology

## 2021-06-26 ENCOUNTER — Inpatient Hospital Stay: Payer: Managed Care, Other (non HMO)

## 2021-06-26 ENCOUNTER — Other Ambulatory Visit: Payer: Self-pay | Admitting: Hematology and Oncology

## 2021-06-26 VITALS — BP 143/86 | HR 62 | Temp 97.3°F | Resp 17 | Wt 142.4 lb

## 2021-06-26 DIAGNOSIS — R7989 Other specified abnormal findings of blood chemistry: Secondary | ICD-10-CM | POA: Diagnosis not present

## 2021-06-26 DIAGNOSIS — C249 Malignant neoplasm of biliary tract, unspecified: Secondary | ICD-10-CM

## 2021-06-26 DIAGNOSIS — Z5112 Encounter for antineoplastic immunotherapy: Secondary | ICD-10-CM | POA: Diagnosis not present

## 2021-06-26 DIAGNOSIS — C7951 Secondary malignant neoplasm of bone: Secondary | ICD-10-CM

## 2021-06-26 DIAGNOSIS — I2699 Other pulmonary embolism without acute cor pulmonale: Secondary | ICD-10-CM

## 2021-06-26 LAB — CBC WITH DIFFERENTIAL (CANCER CENTER ONLY)
Abs Immature Granulocytes: 0.01 10*3/uL (ref 0.00–0.07)
Basophils Absolute: 0 10*3/uL (ref 0.0–0.1)
Basophils Relative: 1 %
Eosinophils Absolute: 0 10*3/uL (ref 0.0–0.5)
Eosinophils Relative: 1 %
HCT: 40.3 % (ref 39.0–52.0)
Hemoglobin: 13.4 g/dL (ref 13.0–17.0)
Immature Granulocytes: 0 %
Lymphocytes Relative: 35 %
Lymphs Abs: 1.6 10*3/uL (ref 0.7–4.0)
MCH: 31.2 pg (ref 26.0–34.0)
MCHC: 33.3 g/dL (ref 30.0–36.0)
MCV: 93.7 fL (ref 80.0–100.0)
Monocytes Absolute: 0.4 10*3/uL (ref 0.1–1.0)
Monocytes Relative: 9 %
Neutro Abs: 2.5 10*3/uL (ref 1.7–7.7)
Neutrophils Relative %: 54 %
Platelet Count: 419 10*3/uL — ABNORMAL HIGH (ref 150–400)
RBC: 4.3 MIL/uL (ref 4.22–5.81)
RDW: 16.7 % — ABNORMAL HIGH (ref 11.5–15.5)
WBC Count: 4.5 10*3/uL (ref 4.0–10.5)
nRBC: 0 % (ref 0.0–0.2)

## 2021-06-26 LAB — CMP (CANCER CENTER ONLY)
ALT: 89 U/L — ABNORMAL HIGH (ref 0–44)
AST: 55 U/L — ABNORMAL HIGH (ref 15–41)
Albumin: 3.5 g/dL (ref 3.5–5.0)
Alkaline Phosphatase: 281 U/L — ABNORMAL HIGH (ref 38–126)
Anion gap: 7 (ref 5–15)
BUN: 23 mg/dL (ref 8–23)
CO2: 28 mmol/L (ref 22–32)
Calcium: 9.1 mg/dL (ref 8.9–10.3)
Chloride: 106 mmol/L (ref 98–111)
Creatinine: 0.85 mg/dL (ref 0.61–1.24)
GFR, Estimated: >60 mL/min (ref >60)
Glucose, Bld: 95 mg/dL (ref 70–99)
Potassium: 4.3 mmol/L (ref 3.5–5.1)
Sodium: 141 mmol/L (ref 135–145)
Total Bilirubin: 0.7 mg/dL (ref 0.3–1.2)
Total Protein: 6.7 g/dL (ref 6.5–8.1)

## 2021-06-26 LAB — MAGNESIUM: Magnesium: 2.1 mg/dL (ref 1.7–2.4)

## 2021-06-26 MED FILL — Fosaprepitant Dimeglumine For IV Infusion 150 MG (Base Eq): INTRAVENOUS | Qty: 5 | Status: AC

## 2021-06-26 MED FILL — Dexamethasone Sodium Phosphate Inj 100 MG/10ML: INTRAMUSCULAR | Qty: 1 | Status: AC

## 2021-06-26 NOTE — Assessment & Plan Note (Signed)
Overall, he tolerated treatment very well without major side effects The cause of his recent skin rash is unknown; he had no rash with recent treatment I will add Pepcid to his future treatment as precautionary measure in case he is allergic to one of the component of his treatment I am encouraged to see significant improvement of his liver enzymes and overall health with weight gain His tumor marker is trending down I felt that he is responding well to treatment I recommend 3 cycles of therapy before repeating CT imaging

## 2021-06-26 NOTE — Assessment & Plan Note (Signed)
His elevated liver enzymes are due to disease, improved since we started him on treatment Observe closely Since his bilirubin is within normal range, no dose adjustment is required

## 2021-06-26 NOTE — Assessment & Plan Note (Signed)
He has intermittent pain in his back from his disease I assured the patient he will improve but the process can take some time He will continue to take oxycodone as needed

## 2021-06-26 NOTE — Assessment & Plan Note (Signed)
He will continue anticoagulation therapy indefinitely I plan to add CT imaging of the chest to reassess

## 2021-06-26 NOTE — Progress Notes (Signed)
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[place]) Overall, he tolerated treatment very well without major side effects The cause of his recent skin rash is unknown; he had no rash with recent treatment I will add Pepcid to his future treatment as precautionary measure in case he is allergic to one of the component of his treatment I am encouraged to see significant improvement of his liver enzymes and overall health with weight gain His tumor marker is trending down I felt that he is responding well to treatment I recommend 3 cycles of therapy before repeating CT imaging  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[facility]) He has intermittent pain in his back from his disease I assured the patient he will improve but the process can take some time He will continue to take oxycodone as needed  Elevated LFTs His elevated liver enzymes are due to disease, improved since we started him on treatment Observe closely Since his bilirubin is within normal range, no dose adjustment is required  Pulmonary embolism ([place]) He will continue anticoagulation therapy indefinitely I plan to add CT imaging of the chest to reassess  Orders Placed This Encounter  Procedures   CT CHEST ABDOMEN PELVIS W CONTRAST    Standing Status:   Future    Standing Expiration Date:   06/26/2022    Order Specific Question:   Preferred imaging location?    Answer:   [facility]    Order Specific Question:   Radiology Contrast Protocol - do NOT remove file path    Answer:   [email or web address]    All questions were answered. The patient knows to call the clinic with any problems, questions or concerns. The total time spent in the appointment was 30 minutes encounter with patients including review of chart and various tests results, discussions about plan of care and coordination of  care plan   Heath Lark, MD 06/26/2021 10:50 AM  INTERVAL HISTORY: Please see below for problem oriented charting. he returns for treatment follow-up for chemotherapy on gemcitabine, cisplatin and Imfinzi His son and interpreter is present He is doing well except for intermittent back pain He denies nausea, vomiting or changes in bowel habits No bleeding complications from anticoagulation therapy He takes 3-4 oxycodone per day on average No skin rash from recent treatment Denies peripheral neuropathy  REVIEW OF SYSTEMS:   Constitutional: Denies fevers, chills or abnormal weight loss Eyes: Denies blurriness of vision Ears, nose, mouth, throat, and face: Denies mucositis or sore throat Respiratory: Denies cough, dyspnea or wheezes Cardiovascular: Denies palpitation, chest discomfort or lower extremity swelling Gastrointestinal:  Denies nausea, heartburn or change in bowel habits Skin: Denies abnormal skin rashes Lymphatics: Denies new lymphadenopathy or easy bruising Neurological:Denies numbness, tingling or new weaknesses Behavioral/Psych: Mood is stable, no new changes  All other systems were reviewed with the patient and are negative.  I have reviewed the past medical history, past surgical history, social history and family history with the patient and they are unchanged from previous note.  ALLERGIES:  has No Known Allergies.  MEDICATIONS:  Current Outpatient Medications  Medication Sig Dispense Refill   apixaban (ELIQUIS) 5 MG TABS tablet Take 1 tablet (5 mg total) by mouth 2 (two) times daily. 60 tablet 1   lidocaine-prilocaine (EMLA) cream Apply to affected area once 30 g 3   ondansetron (ZOFRAN) 8 MG tablet Take 1 tablet by mouth  every 8 hours as needed. 30 tablet 1   oxyCODONE (OXY IR/ROXICODONE) 5 MG immediate release tablet Take 1 tablet (5 mg total) by mouth every 4 hours as needed for severe pain. 60 tablet 0   prochlorperazine (COMPAZINE) 10 MG tablet Take 1 tablet  by mouth every 6 hours as needed (Nausea or vomiting). 30 tablet 1   senna-docusate (SENOKOT-S) 8.6-50 MG tablet Take 1 tablet by mouth 2 (two) times daily as needed for mild constipation or moderate constipation.     No current facility-administered medications for this visit.    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 He underwent liver biopsy   05/14/2021 Imaging   RIGHT:  - There is no evidence of deep vein thrombosis in the lower extremity.     - No cystic structure found in the popliteal fossa.     LEFT:  - There is no evidence of deep vein thrombosis in the lower extremity.     - No cystic structure found in the popliteal fossa.    05/14/2021 Imaging   1. Pathologic split type fracture of T4 with approximately 25% height loss, likely due to metastatic disease. 2. No other evidence of metastatic disease of the cervical, thoracic or lumbar spine. 3. Moderate left L5-S1 neural foraminal stenosis. 4. Left-greater-than-right lateral recess narrowing at L4-5 and L5-S1.     05/14/2021 Imaging   1. 5 cm ill-defined irregular low-density lesion in the anterior right liver inferiorly is highly suspicious for primary or metastatic neoplasm. The lesion obliterates bile ducts in the central right liver resulting in right hepatic lobe biliary dilatation. Similarly, portal venous anatomy in the anterior right liver is involved along the superomedial margin of the lesion. Lesion should be amenable to tissue sampling. 2. Gallbladder wall is contracted and irregular in appearance adjacent to the lesion. Imaging features suggest that gallbladder changes are secondary although primary gallbladder neoplasm not excluded. 3. 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 No evidence for additional metastatic disease in the abdomen or  pelvis. 4. Prostatomegaly. 5. Trace free fluid in the pelvis. 6. Aortic Atherosclerosis (ICD10-I70.0).   05/15/2021 Pathology Results   Clinical History: Right hepatic tumor and evidence for bone metastasis (crm)    FINAL MICROSCOPIC DIAGNOSIS:   A. LIVER, RIGHT, BIOPSY:  -  Adenocarcinoma  -  See comment   COMMENT:   By immunohistochemistry, the neoplastic cells are positive for cytokeratin 7, monoclonal and polyclonal CEA with patchy positivity for cytokeratin 20.  The tumor cells are negative for TTF-1, AFP, prostein, PSA and CDX2.  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     05/15/2021 Procedure   Ultrasound-guided core biopsies of a right hepatic lesion.   05/16/2021 Imaging   MRI brain  Unremarkable appearance of the brain for age. No evidence of intracranial metastases.     05/16/2021 Imaging   Ct chest  1. Study is once again positive for subsegmental sized emboli in the right lower lobe. No larger central, lobar or segmental sized emboli are noted. 2. Lytic lesion in T4 with pathologic fracture, similar to the prior study. This was better depicted on recent thoracic spine MRI. 3. Aortic atherosclerosis, in addition to left main and 3 vessel coronary artery disease. There is also mild  ectasia of the ascending thoracic aorta (4.0 cm in diameter).    05/19/2021 Procedure   Ultrasound and fluoroscopically guided right internal jugular single lumen power port catheter insertion. Tip in the SVC/RA junction. Catheter ready for use.   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 Results of the tumor marker test revealed 1627.     PHYSICAL EXAMINATION: ECOG PERFORMANCE STATUS: 1 - Symptomatic but completely ambulatory  Vitals:   06/26/21 0929  BP: (!) 143/86  Pulse: 62  Resp: 17  Temp: (!) 97.3 F (36.3 C)  SpO2: 97%   Filed Weights   06/26/21 0929  Weight: 142 lb 6.4 oz (64.6 kg)    GENERAL:alert, no distress and comfortable SKIN: skin color, texture, turgor are normal, no rashes or significant lesions EYES: normal, Conjunctiva are pink and non-injected, sclera clear OROPHARYNX:no exudate, no erythema and lips, buccal mucosa, and tongue normal  NECK: supple, thyroid normal size, non-tender, without nodularity LYMPH:  no palpable lymphadenopathy in the cervical, axillary or inguinal LUNGS: clear to auscultation and percussion with normal breathing effort HEART: regular rate & rhythm and no murmurs and no lower extremity edema ABDOMEN:abdomen soft, non-tender and normal bowel sounds Musculoskeletal:no cyanosis of digits and no clubbing  NEURO: alert & oriented x 3 with fluent speech, no focal motor/sensory deficits  LABORATORY DATA:  I have reviewed the data as listed    Component Value Date/Time   NA 141 06/26/2021 0913   NA 145 (H) 04/28/2018 1054   K 4.3 06/26/2021 0913   CL 106 06/26/2021 0913   CO2 28 06/26/2021 0913   GLUCOSE 95 06/26/2021 0913   BUN 23 06/26/2021 0913   BUN 13 04/28/2018 1054   CREATININE 0.85 06/26/2021 0913   CALCIUM 9.1 06/26/2021 0913   PROT 6.7 06/26/2021 0913   PROT 6.8 04/28/2018 1054   ALBUMIN 3.5 06/26/2021 0913   ALBUMIN 4.4 04/28/2018 1054   AST 55 (H) 06/26/2021 0913   ALT 89 (H) 06/26/2021 0913   ALKPHOS 281 (H) 06/26/2021 0913   BILITOT 0.7 06/26/2021 0913   GFRNONAA >60 06/26/2021 0913   GFRAA >60 08/22/2018 0904    No results found  for: SPEP, UPEP  Lab Results  Component Value Date   WBC 4.5 06/26/2021   NEUTROABS 2.5 06/26/2021   HGB 13.4 06/26/2021   HCT 40.3 06/26/2021   MCV 93.7 06/26/2021   PLT 419 (H) 06/26/2021      Chemistry      Component Value Date/Time   NA 141 06/26/2021 0913   NA 145 (H) 04/28/2018 1054   K 4.3 06/26/2021 0913   CL 106 06/26/2021 0913   CO2 28 06/26/2021 0913   BUN 23 06/26/2021 0913   BUN 13 04/28/2018 1054   CREATININE 0.85 06/26/2021 0913      Component Value Date/Time   CALCIUM 9.1 06/26/2021 0913   ALKPHOS 281 (H) 06/26/2021 0913   AST 55 (H) 06/26/2021 0913   ALT 89 (H) 06/26/2021 0913   BILITOT 0.7 06/26/2021 0913

## 2021-06-29 ENCOUNTER — Telehealth: Payer: Self-pay

## 2021-06-29 ENCOUNTER — Other Ambulatory Visit: Payer: Self-pay

## 2021-06-29 ENCOUNTER — Inpatient Hospital Stay: Payer: Managed Care, Other (non HMO)

## 2021-06-29 VITALS — BP 136/81 | HR 80 | Temp 98.4°F | Resp 16 | Wt 139.1 lb

## 2021-06-29 DIAGNOSIS — C249 Malignant neoplasm of biliary tract, unspecified: Secondary | ICD-10-CM

## 2021-06-29 DIAGNOSIS — Z5112 Encounter for antineoplastic immunotherapy: Secondary | ICD-10-CM | POA: Diagnosis not present

## 2021-06-29 MED ORDER — SODIUM CHLORIDE 0.9 % IV SOLN
1000.0000 mg/m2 | Freq: Once | INTRAVENOUS | Status: AC
  Administered 2021-06-29: 1672 mg via INTRAVENOUS
  Filled 2021-06-29: qty 43.97

## 2021-06-29 MED ORDER — SODIUM CHLORIDE 0.9 % IV SOLN
25.0000 mg/m2 | Freq: Once | INTRAVENOUS | Status: AC
  Administered 2021-06-29: 42 mg via INTRAVENOUS
  Filled 2021-06-29: qty 42

## 2021-06-29 MED ORDER — SODIUM CHLORIDE 0.9% FLUSH
10.0000 mL | INTRAVENOUS | Status: DC | PRN
  Administered 2021-06-29: 10 mL

## 2021-06-29 MED ORDER — SODIUM CHLORIDE 0.9 % IV SOLN
10.0000 mg | Freq: Once | INTRAVENOUS | Status: AC
  Administered 2021-06-29: 10 mg via INTRAVENOUS
  Filled 2021-06-29: qty 10

## 2021-06-29 MED ORDER — SODIUM CHLORIDE 0.9 % IV SOLN: Freq: Once | INTRAVENOUS | Status: AC

## 2021-06-29 MED ORDER — MAGNESIUM SULFATE 2 GM/50ML IV SOLN
2.0000 g | Freq: Once | INTRAVENOUS | Status: AC
  Administered 2021-06-29: 2 g via INTRAVENOUS
  Filled 2021-06-29: qty 50

## 2021-06-29 MED ORDER — PALONOSETRON HCL INJECTION 0.25 MG/5ML
0.2500 mg | Freq: Once | INTRAVENOUS | Status: AC
  Administered 2021-06-29: 0.25 mg via INTRAVENOUS
  Filled 2021-06-29: qty 5

## 2021-06-29 MED ORDER — HEPARIN SOD (PORK) LOCK FLUSH 100 UNIT/ML IV SOLN
500.0000 [IU] | Freq: Once | INTRAVENOUS | Status: AC | PRN
  Administered 2021-06-29: 500 [IU]

## 2021-06-29 MED ORDER — POTASSIUM CHLORIDE IN NACL 20-0.9 MEQ/L-% IV SOLN
Freq: Once | INTRAVENOUS | Status: AC
  Filled 2021-06-29: qty 1000

## 2021-06-29 MED ORDER — FAMOTIDINE 20 MG IN NS 100 ML IVPB
20.0000 mg | Freq: Once | INTRAVENOUS | Status: AC
  Administered 2021-06-29: 20 mg via INTRAVENOUS
  Filled 2021-06-29: qty 100

## 2021-06-29 MED ORDER — SODIUM CHLORIDE 0.9 % IV SOLN
150.0000 mg | Freq: Once | INTRAVENOUS | Status: AC
  Administered 2021-06-29: 150 mg via INTRAVENOUS
  Filled 2021-06-29: qty 150

## 2021-06-29 NOTE — Patient Instructions (Signed)
[facility] MEDICAL ONCOLOGY  Discharge Instructions: Thank you for choosing [facility] to provide your oncology and hematology care.   If you have a lab appointment with the Cancer Center, please go directly to the Cancer Center and check in at the registration area.   Wear comfortable clothing and clothing appropriate for easy access to any Portacath or PICC line.   We strive to give you quality time with your provider. You may need to reschedule your appointment if you arrive late (15 or more minutes).  Arriving late affects you and other patients whose appointments are after yours.  Also, if you miss three or more appointments without notifying the office, you may be dismissed from the clinic at the provider's discretion.      For prescription refill requests, have your pharmacy contact our office and allow 72 hours for refills to be completed.    Today you received the following chemotherapy and/or immunotherapy agents : Cisplatin    To help prevent nausea and vomiting after your treatment, we encourage you to take your nausea medication as directed.  BELOW ARE SYMPTOMS THAT SHOULD BE REPORTED IMMEDIATELY: *FEVER GREATER THAN 100.4 F (38 C) OR HIGHER *CHILLS OR SWEATING *NAUSEA AND VOMITING THAT IS NOT CONTROLLED WITH YOUR NAUSEA MEDICATION *UNUSUAL SHORTNESS OF BREATH *UNUSUAL BRUISING OR BLEEDING *URINARY PROBLEMS (pain or burning when urinating, or frequent urination) *BOWEL PROBLEMS (unusual diarrhea, constipation, pain near the anus) TENDERNESS IN MOUTH AND THROAT WITH OR WITHOUT PRESENCE OF ULCERS (sore throat, sores in mouth, or a toothache) UNUSUAL RASH, SWELLING OR PAIN  UNUSUAL VAGINAL DISCHARGE OR ITCHING   Items with * indicate a potential emergency and should be followed up as soon as possible or go to the Emergency Department if any problems should occur.  Please show the CHEMOTHERAPY ALERT CARD or IMMUNOTHERAPY ALERT CARD at check-in to  the Emergency Department and triage nurse.  Should you have questions after your visit or need to cancel or reschedule your appointment, please contact [facility]  Dept: [phone]  and follow the prompts.  Office hours are 8:00 a.m. to 4:30 p.m. Monday - Friday. Please note that voicemails left after 4:00 p.m. may not be returned until the following business day.  We are closed weekends and major holidays. You have access to a nurse at all times for urgent questions. Please call the main number to the clinic Dept: [phone] and follow the prompts.   For any non-urgent questions, you may also contact your provider using MyChart. We now offer e-Visits for anyone 18 and older to request care online for non-urgent symptoms. For details visit [email or web address].   Also download the MyChart app! Go to the app store, search "MyChart", open the app, select , and log in with your MyChart username and password.  Due to Covid, a mask is required upon entering the hospital/clinic. If you do not have a mask, one will be given to you upon arrival. For doctor visits, patients may have 1 support person aged 18 or older with them. For treatment visits, patients cannot have anyone with them due to current Covid guidelines and our immunocompromised population.   

## 2021-06-29 NOTE — Telephone Encounter (Signed)
Notified Patient's Son (per Patient's request due to language barrier) of completion of FMLA paperwork for Patient. Fax Transmission Confirmation received and copy mailed to Patient as requested.

## 2021-07-01 ENCOUNTER — Other Ambulatory Visit: Payer: Self-pay

## 2021-07-01 ENCOUNTER — Inpatient Hospital Stay: Payer: Managed Care, Other (non HMO)

## 2021-07-01 VITALS — BP 131/82 | HR 61 | Temp 98.2°F | Resp 16

## 2021-07-01 DIAGNOSIS — C249 Malignant neoplasm of biliary tract, unspecified: Secondary | ICD-10-CM

## 2021-07-01 DIAGNOSIS — Z5112 Encounter for antineoplastic immunotherapy: Secondary | ICD-10-CM | POA: Diagnosis not present

## 2021-07-01 MED ORDER — PEGFILGRASTIM-BMEZ 6 MG/0.6ML ~~LOC~~ SOSY
6.0000 mg | PREFILLED_SYRINGE | Freq: Once | SUBCUTANEOUS | Status: AC
  Administered 2021-07-01: 6 mg via SUBCUTANEOUS
  Filled 2021-07-01: qty 0.6

## 2021-07-01 NOTE — Patient Instructions (Signed)
Pegfilgrastim injection ?y l thu?c g? PEGFILGRASTIM l y?u t? kch thch dng b?ch c?u h?t (granulocyte colony-stimulating factor) tc d?ng ko di c tc d?ng kch thch s? sinh tr??ng c?a b?ch c?u trung tnh (neutrophil), l m?t lo?i t? bo b?ch c?u quan tr?ng trong kh? n?ng khng nhi?m trng c?a c? th?. Thu?c ???c dng ?? gi?m t? l? b? s?t v nhi?m trng ? nh?ng b?nh nhn b? m?t s? lo?i ung th? no ? ?ang ???c ha tr? li?u gy ?nh h??ng ??n t?y x??ng, v ?? t?ng kh? n?ng s?ng st sau khi b? ph?i nhi?m v?i phng x? li?u cao. Thu?c ny c th? ???c dng cho nh?ng m?c ?ch khc; hy h?i ng??i cung c?p d?ch v? y t? ho?c d??c s? c?a mnh, n?u qu v? c th?c m?c. (CC) NHN HI?U PH? BI?N: Fulphila, Neulasta, Nyvepria, UDENYCA, Ziextenzo Ti c?n ph?i bo cho ng??i cung c?p d?ch v? y t? c?a mnh ?i?u g tr??c khi dng thu?c ny? H? c?n bi?t li?u qu v? c b?t k? tnh tr?ng no sau ?y khng: b?nh th?n d? ?ng v?i latex ?ang ???c x? tr? b?nh h?ng c?u hnh l??i li?m cc ph?n ?ng c?a da ??i v?i keo dnh acrylic (ch? ring D?ng C? Tim Dn Ln C? th? - On-body Injector) ph?n ?ng b?t th??ng ho?c d? ?ng v?i pegfilgrastim ho?c filgrastim pha?n ??ng b?t th???ng ho??c di? ??ng v??i ca?c d??c ph?m kha?c, th?c ph?m, thu?c nhu?m, ho??c ch?t ba?o qua?n ?ang c thai ho??c ??nh co? thai ?ang cho con bu? Ti nn s? d?ng thu?c ny nh? th? no? Thu?c ny ?? tim d??i da. N?u qu v? nh?n thu?c ny ? nh, th qu v? s? ???c by cch chu?n b? v dng cc ?ng tim ? ???c n?p s?n ho?c cch dng D?ng C? Tim Dn Ln C? Th? Associate Professor). Tham kh?o b?n H??ng d?n S? d?ng dnh cho b?nh nhn ?? ???c h??ng d?n chi ti?t. Hy s? d?ng ?ng nh? ? ???c ch? d?n. Hy bo ngay cho bc s? ho?c chuyn vin y t?, n?u qu v? nghi ng? r?ng D?ng C? Tim Dn Ln C? th? khng ho?t ??ng nh? d? ki?n, ho?c n?u qu v? nghi ng? r?ng vi?c s? d?ng D?ng C? Tim Dn Ln C? th? ? khi?n cho li?u dng b? thi?u ho?c khng ??y ??. ?i?u quan  tr?ng l qu v? ph?i b? kim tim v ?ng tim ? dng vo thng chuyn ??ng v?t bn nh?n. Khng ???c b? chng vo thng rc thng th??ng. N?u qu v? khng c thng chuyn ??ng v?t bn nh?n, th hy g?i t?i d??c s? ho?c [place] vin y t? c?a mnh ?? xin m?t ci. Hy bn v?i bc s? nhi khoa c?a qu v? v? vi?c dng thu?c ny ? tr? em. Thu?c ny c th? ???c k toa trong nh?ng tr??ng h?p ch?n l?c, nh?ng c?n ph?i th?n tr?ng. Qu li?u: N?u qu v? cho r?ng mnh ? dng qu nhi?u thu?c ny, th hy lin l?c v?i trung tm ki?m sot ch?t ??c ho?c phng c?p c?u ngay l?p t?c. L?U : Thu?c ny ch? dnh ring cho qu v?. Khng chia s? thu?c ny v?i nh?ng ng??i khc. N?u ti l? qun m?t li?u th sao? ?i?u quan tr?ng l khng nn b? l? li?u thu?c no. Hy lin l?c v?i bc s? ho?c chuyn vin y t? c?a mnh n?u qu v? l? qun m?t li?u thu?c. N?u qu v? b? l? m?t li?u thu?c do D?ng C? Tim  Dn Ln C? Th? Associate Professor) b? tr?c tr?c ho?c b? r r?, th c?n ph?i dng ?ng tim lo?i s? d?ng b?ng tay c n?p s?n m?t li?u thu?c ?? tim m?t li?u thu?c m?i cng s?m cng t?t. Nh?ng g c th? t??ng tc v?i thu?c ny? Ch?a co? nghin c??u v? t??ng ta?c thu?c. Danh sch ny c th? khng m t? ?? h?t cc t??ng tc c th? x?y ra. Hy ??a cho ng??i cung c?p d?ch v? y t? c?a mnh danh sch t?t c? cc thu?c, th?o d??c, cc thu?c khng c?n toa, ho?c cc ch? ph?m b? sung m qu v? dng. C?ng nn bo cho h? bi?t r?ng qu v? c ht thu?c, u?ng r??u, ho?c c s? d?ng ma ty tri php hay khng. Vi th? c th? t??ng tc v?i thu?c c?a qu v?. Ti c?n ph?i theo di ?i?u g trong khi dng thu?c ny? Qu v? s? ???c theo di ch?t ch? trong khi dng thu?c ny. Qu v? s? c?n ?i lm cc xt nghi?m mu ??nh k? trong khi qu v? dng thu?c ny. Hy th?o lu?n v?i bc s? ho?c [place] vin y t? v? nguy c? m?c b?nh ung th? c?a mnh. Qu v? c th? c nguy c? cao b? m?c m?t s? b?nh ung th?, n?u dng thu?c ny. N?u qu v? s?p ph?i ch?p MRI, ch?p CT scan, ho?c lm  th? thu?t khc, th hy bo cho bc s? c?a mnh bi?t r?ng qu v? ?ang dng thu?c ny (Ch? ring D?ng C? Tim Dn Ln C? th? - On-body Injector). Ti c th? nh?n th?y nh?ng tc d?ng ph? no khi dng thu?c ny? Nh?ng tc d?ng ph? qu v? c?n ph?i bo cho bc s? ho?c chuyn vin y t? cng s?m cng t?t: cc ph?n ?ng d? ?ng (da b? m?n ??, ng?a, ho?c n?i my ?ay, s?ng ? m?t, mi ho?c l??i) ?au l?ng chng m?t s?t ?au, ??, ng?a, ho?c kch ?ng ? ch? tim cc n?t l?m t?m ?? trn da n??c ti?u c mu ?? ho?c mu nu s?m kh th? ho?c cc v?n ?? v? h h?p ?au b?ng ho?c ?au bn m?ng s??n, ho?c ?au vai s?ng c?m th?y m?t m?i kh ?i ti?u ho?c thay ??i l??ng n??c ti?u ???c bi ti?t b? b?m tm ho?c xu?t huy?t b?t th??ng Cc tc d?ng ph? khng c?n ph?i ch?m [place] y t? (hy bo cho bc s? ho?c chuyn vin y t?, n?u cc tc d?ng ph? ny ti?p di?n ho?c gy phi?n toi): ?au x??ng ?au ho?c nh?c c? b?p Danh sch ny c th? khng m t? ?? h?t cc tc d?ng ph? c th? x?y ra. Xin g?i t?i bc s? c?a mnh ?? ???c c? v?n chuyn mn v? cc tc d?ng ph?Sander Nephew v? c th? t??ng trnh cc tc d?ng ph? cho FDA theo s? [phone]. Ti nn c?t gi? thu?c c?a mnh ? ?u? ?? ngoi t?m tay tr? em. N?u ?ang dng thu?c ny t?i nh, th qu v? s? ???c ch? d?n cch c?t gi? thu?c. V?t b? t?t c? thu?c ch?a dng sau ngy h?t h?n in trn nhn thu?c ho?c bao thu?c. L?U : ?y l b?n tm t?t. N c th? khng bao hm t?t c? thng tin c th? c. N?u qu v? th?c m?c v? thu?c ny, xin trao ??i v?i bc s?, d??c s?, ho?c ng??i cung c?p d?ch v? y t? c?a mnh.  2022 Elsevier/Gold Standard (2021-01-05 00:00:00)

## 2021-07-06 ENCOUNTER — Ambulatory Visit
Admission: RE | Admit: 2021-07-06 | Discharge: 2021-07-06 | Disposition: A | Discharge status: Home / Self Care | Payer: Managed Care, Other (non HMO) | Source: Ambulatory Visit | Attending: Hematology and Oncology | Admitting: Hematology and Oncology

## 2021-07-06 DIAGNOSIS — C249 Malignant neoplasm of biliary tract, unspecified: Secondary | ICD-10-CM

## 2021-07-06 DIAGNOSIS — C7951 Secondary malignant neoplasm of bone: Secondary | ICD-10-CM

## 2021-07-06 NOTE — Progress Notes (Signed)
  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 During treatment he did very well with radiotherapy and did not have significant desquamation. He developed a rash that was felt to be due to an exposure Dr. Alvy Bimler treated with steroids. His son reports he is doing well and his rash has fully resolved. It was unknown what triggered this per his report.  Impression/Plan: 1. 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 The patient has been doing well since completion of radiotherapy. We discussed that we will plan to follow up with him in about 2 months for repeat MRI and discussion in the brain and spine oncology conference, but he will also continue to follow up with Dr. Alvy Bimler in medical oncology.      Carola Rhine, PAC

## 2021-07-10 ENCOUNTER — Other Ambulatory Visit: Payer: Self-pay

## 2021-07-10 ENCOUNTER — Other Ambulatory Visit: Payer: Self-pay | Admitting: Radiation Therapy

## 2021-07-10 ENCOUNTER — Inpatient Hospital Stay: Payer: Managed Care, Other (non HMO)

## 2021-07-10 DIAGNOSIS — C249 Malignant neoplasm of biliary tract, unspecified: Secondary | ICD-10-CM

## 2021-07-10 DIAGNOSIS — Z5112 Encounter for antineoplastic immunotherapy: Secondary | ICD-10-CM | POA: Diagnosis not present

## 2021-07-10 LAB — CBC WITH DIFFERENTIAL (CANCER CENTER ONLY)
Abs Immature Granulocytes: 1.03 10*3/uL — ABNORMAL HIGH (ref 0.00–0.07)
Basophils Absolute: 0.1 10*3/uL (ref 0.0–0.1)
Basophils Relative: 1 %
Eosinophils Absolute: 0.1 10*3/uL (ref 0.0–0.5)
Eosinophils Relative: 1 %
HCT: 37.4 % — ABNORMAL LOW (ref 39.0–52.0)
Hemoglobin: 12.6 g/dL — ABNORMAL LOW (ref 13.0–17.0)
Immature Granulocytes: 4 %
Lymphocytes Relative: 9 %
Lymphs Abs: 2.2 10*3/uL (ref 0.7–4.0)
MCH: 31.6 pg (ref 26.0–34.0)
MCHC: 33.7 g/dL (ref 30.0–36.0)
MCV: 93.7 fL (ref 80.0–100.0)
Monocytes Absolute: 2.6 10*3/uL — ABNORMAL HIGH (ref 0.1–1.0)
Monocytes Relative: 11 %
Neutro Abs: 17.7 10*3/uL — ABNORMAL HIGH (ref 1.7–7.7)
Neutrophils Relative %: 74 %
Platelet Count: 116 10*3/uL — ABNORMAL LOW (ref 150–400)
RBC: 3.99 MIL/uL — ABNORMAL LOW (ref 4.22–5.81)
RDW: 16.1 % — ABNORMAL HIGH (ref 11.5–15.5)
WBC Count: 23.8 10*3/uL — ABNORMAL HIGH (ref 4.0–10.5)
nRBC: 0.6 % — ABNORMAL HIGH (ref 0.0–0.2)

## 2021-07-10 LAB — CMP (CANCER CENTER ONLY)
ALT: 30 U/L (ref 0–44)
AST: 18 U/L (ref 15–41)
Albumin: 3.6 g/dL (ref 3.5–5.0)
Alkaline Phosphatase: 380 U/L — ABNORMAL HIGH (ref 38–126)
Anion gap: 10 (ref 5–15)
BUN: 8 mg/dL (ref 8–23)
CO2: 27 mmol/L (ref 22–32)
Calcium: 9.4 mg/dL (ref 8.9–10.3)
Chloride: 105 mmol/L (ref 98–111)
Creatinine: 0.81 mg/dL (ref 0.61–1.24)
GFR, Estimated: >60 mL/min (ref >60)
Glucose, Bld: 95 mg/dL (ref 70–99)
Potassium: 4.7 mmol/L (ref 3.5–5.1)
Sodium: 142 mmol/L (ref 135–145)
Total Bilirubin: 0.3 mg/dL (ref 0.3–1.2)
Total Protein: 6.7 g/dL (ref 6.5–8.1)

## 2021-07-10 LAB — MAGNESIUM: Magnesium: 2.1 mg/dL (ref 1.7–2.4)

## 2021-07-10 MED FILL — Dexamethasone Sodium Phosphate Inj 100 MG/10ML: INTRAMUSCULAR | Qty: 1 | Status: AC

## 2021-07-10 MED FILL — Fosaprepitant Dimeglumine For IV Infusion 150 MG (Base Eq): INTRAVENOUS | Qty: 5 | Status: AC

## 2021-07-13 ENCOUNTER — Inpatient Hospital Stay: Payer: Managed Care, Other (non HMO) | Attending: Radiation Oncology

## 2021-07-13 ENCOUNTER — Other Ambulatory Visit: Payer: Self-pay

## 2021-07-13 VITALS — BP 126/84 | HR 76 | Temp 98.1°F | Resp 18

## 2021-07-13 DIAGNOSIS — Z86711 Personal history of pulmonary embolism: Secondary | ICD-10-CM | POA: Insufficient documentation

## 2021-07-13 DIAGNOSIS — Z5112 Encounter for antineoplastic immunotherapy: Secondary | ICD-10-CM | POA: Insufficient documentation

## 2021-07-13 DIAGNOSIS — D61818 Other pancytopenia: Secondary | ICD-10-CM | POA: Insufficient documentation

## 2021-07-13 DIAGNOSIS — Z7901 Long term (current) use of anticoagulants: Secondary | ICD-10-CM | POA: Insufficient documentation

## 2021-07-13 DIAGNOSIS — Z5189 Encounter for other specified aftercare: Secondary | ICD-10-CM | POA: Diagnosis not present

## 2021-07-13 DIAGNOSIS — C249 Malignant neoplasm of biliary tract, unspecified: Secondary | ICD-10-CM

## 2021-07-13 DIAGNOSIS — C221 Intrahepatic bile duct carcinoma: Secondary | ICD-10-CM | POA: Insufficient documentation

## 2021-07-13 DIAGNOSIS — C7951 Secondary malignant neoplasm of bone: Secondary | ICD-10-CM | POA: Insufficient documentation

## 2021-07-13 DIAGNOSIS — Z5111 Encounter for antineoplastic chemotherapy: Secondary | ICD-10-CM | POA: Diagnosis present

## 2021-07-13 DIAGNOSIS — Z79899 Other long term (current) drug therapy: Secondary | ICD-10-CM | POA: Diagnosis not present

## 2021-07-13 DIAGNOSIS — N400 Enlarged prostate without lower urinary tract symptoms: Secondary | ICD-10-CM | POA: Diagnosis not present

## 2021-07-13 MED ORDER — MAGNESIUM SULFATE 2 GM/50ML IV SOLN
2.0000 g | Freq: Once | INTRAVENOUS | Status: AC
  Administered 2021-07-13: 2 g via INTRAVENOUS
  Filled 2021-07-13: qty 50

## 2021-07-13 MED ORDER — SODIUM CHLORIDE 0.9 % IV SOLN
150.0000 mg | Freq: Once | INTRAVENOUS | Status: AC
  Administered 2021-07-13: 150 mg via INTRAVENOUS
  Filled 2021-07-13: qty 150

## 2021-07-13 MED ORDER — FAMOTIDINE 20 MG IN NS 100 ML IVPB
20.0000 mg | Freq: Once | INTRAVENOUS | Status: AC
Start: 2021-07-13 — End: 2021-07-13
  Administered 2021-07-13: 20 mg via INTRAVENOUS
  Filled 2021-07-13: qty 100

## 2021-07-13 MED ORDER — SODIUM CHLORIDE 0.9 % IV SOLN
1500.0000 mg | Freq: Once | INTRAVENOUS | Status: AC
  Administered 2021-07-13: 1500 mg via INTRAVENOUS
  Filled 2021-07-13: qty 30

## 2021-07-13 MED ORDER — SODIUM CHLORIDE 0.9 % IV SOLN
25.0000 mg/m2 | Freq: Once | INTRAVENOUS | Status: AC
  Administered 2021-07-13: 42 mg via INTRAVENOUS
  Filled 2021-07-13: qty 42

## 2021-07-13 MED ORDER — SODIUM CHLORIDE 0.9 % IV SOLN
1000.0000 mg/m2 | Freq: Once | INTRAVENOUS | Status: AC
  Administered 2021-07-13: 1672 mg via INTRAVENOUS
  Filled 2021-07-13: qty 43.97

## 2021-07-13 MED ORDER — POTASSIUM CHLORIDE IN NACL 20-0.9 MEQ/L-% IV SOLN
Freq: Once | INTRAVENOUS | Status: AC
  Filled 2021-07-13: qty 1000

## 2021-07-13 MED ORDER — SODIUM CHLORIDE 0.9% FLUSH
10.0000 mL | INTRAVENOUS | Status: DC | PRN
  Administered 2021-07-13: 10 mL

## 2021-07-13 MED ORDER — HEPARIN SOD (PORK) LOCK FLUSH 100 UNIT/ML IV SOLN
500.0000 [IU] | Freq: Once | INTRAVENOUS | Status: AC | PRN
  Administered 2021-07-13: 500 [IU]

## 2021-07-13 MED ORDER — SODIUM CHLORIDE 0.9 % IV SOLN
10.0000 mg | Freq: Once | INTRAVENOUS | Status: AC
  Administered 2021-07-13: 10 mg via INTRAVENOUS
  Filled 2021-07-13: qty 10

## 2021-07-13 MED ORDER — PALONOSETRON HCL INJECTION 0.25 MG/5ML
0.2500 mg | Freq: Once | INTRAVENOUS | Status: AC
  Administered 2021-07-13: 0.25 mg via INTRAVENOUS
  Filled 2021-07-13: qty 5

## 2021-07-13 MED ORDER — SODIUM CHLORIDE 0.9 % IV SOLN: Freq: Once | INTRAVENOUS | Status: AC

## 2021-07-13 NOTE — Progress Notes (Signed)
Per Dr. Burr Medico, instead of 2 hour post hydration fluids, do 1L NS over 1 hour.

## 2021-07-13 NOTE — Patient Instructions (Signed)
[facility] ONCOLOG[facility]  Discharge Instructions: Thank you for choosing [facility] to provide your oncology and hematology care.   If you have a lab appointment with the [facility], please go directly to the [facility] and check in at the registration area.   Wear comfortable clothing and clothing appropriate for easy access to any Portacath or PICC line.   We strive to give you quality time with your provider. You may need to reschedule your appointment if you arrive late (15 or more minutes).  Arriving late affects you and other patients whose appointments are after yours.  Also, if you miss three or more appointments without notifying the office, you may be dismissed from the clinic at the provider's discretion.      For prescription refill requests, have your pharmacy contact our office and allow 72 hours for refills to be completed.    Today you received the following chemotherapy and/or immunotherapy agents :  Imfinzi, Gemcitabine, Cisplatin.       To help prevent nausea and vomiting after your treatment, we encourage you to take your nausea medication as directed.  BELOW ARE SYMPTOMS THAT SHOULD BE REPORTED IMMEDIATELY: *FEVER GREATER THAN 100.4 F (38 C) OR HIGHER *CHILLS OR SWEATING *NAUSEA AND VOMITING THAT IS NOT CONTROLLED WITH YOUR NAUSEA MEDICATION *UNUSUAL SHORTNESS OF BREATH *UNUSUAL BRUISING OR BLEEDING *URINARY PROBLEMS (pain or burning when urinating, or frequent urination) *BOWEL PROBLEMS (unusual diarrhea, constipation, pain near the anus) TENDERNESS IN MOUTH AND THROAT WITH OR WITHOUT PRESENCE OF ULCERS (sore throat, sores in mouth, or a toothache) UNUSUAL RASH, SWELLING OR PAIN  UNUSUAL VAGINAL DISCHARGE OR ITCHING   Items with * indicate a potential emergency and should be followed up as soon as possible or go to the Emergency Department if any problems should occur.  Please show the CHEMOTHERAPY ALERT CARD or IMMUNOTHERAPY  ALERT CARD at check-in to the Emergency Department and triage nurse.  Should you have questions after your visit or need to cancel or reschedule your appointment, please contact [facility]  Dept: [phone]  and follow the prompts.  Office hours are 8:00 a.m. to 4:30 p.m. Monday - Friday. Please note that voicemails left after 4:00 p.m. may not be returned until the following business day.  We are closed weekends and major holidays. You have access to a nurse at all times for urgent questions. Please call the main number to the clinic Dept: [phone] and follow the prompts.   For any non-urgent questions, you may also contact your provider using MyChart. We now offer e-Visits for anyone 57 and older to request care online for non-urgent symptoms. For details visit [email or web address].   Also download the MyChart app! Go to the app store, search "MyChart", open the app, select [facility], and log in with your MyChart username and password.  Due to Covid, a mask is required upon entering the hospital/clinic. If you do not have a mask, one will be given to you upon arrival. For doctor visits, patients may have 1 support person aged 42 or older with them. For treatment visits, patients cannot have anyone with them due to current Covid guidelines and our immunocompromised population.

## 2021-07-17 ENCOUNTER — Other Ambulatory Visit: Payer: Self-pay

## 2021-07-17 ENCOUNTER — Encounter: Payer: Self-pay | Admitting: Hematology and Oncology

## 2021-07-17 ENCOUNTER — Inpatient Hospital Stay: Payer: Managed Care, Other (non HMO)

## 2021-07-17 ENCOUNTER — Other Ambulatory Visit (HOSPITAL_COMMUNITY): Payer: Self-pay

## 2021-07-17 ENCOUNTER — Inpatient Hospital Stay (HOSPITAL_BASED_OUTPATIENT_CLINIC_OR_DEPARTMENT_OTHER): Payer: Managed Care, Other (non HMO) | Admitting: Hematology and Oncology

## 2021-07-17 DIAGNOSIS — I2699 Other pulmonary embolism without acute cor pulmonale: Secondary | ICD-10-CM | POA: Diagnosis not present

## 2021-07-17 DIAGNOSIS — R7989 Other specified abnormal findings of blood chemistry: Secondary | ICD-10-CM

## 2021-07-17 DIAGNOSIS — C7951 Secondary malignant neoplasm of bone: Secondary | ICD-10-CM | POA: Diagnosis not present

## 2021-07-17 DIAGNOSIS — C249 Malignant neoplasm of biliary tract, unspecified: Secondary | ICD-10-CM

## 2021-07-17 DIAGNOSIS — K5909 Other constipation: Secondary | ICD-10-CM

## 2021-07-17 DIAGNOSIS — Z5112 Encounter for antineoplastic immunotherapy: Secondary | ICD-10-CM | POA: Diagnosis not present

## 2021-07-17 DIAGNOSIS — M8458XA Pathological fracture in neoplastic disease, other specified site, initial encounter for fracture: Secondary | ICD-10-CM

## 2021-07-17 LAB — CBC WITH DIFFERENTIAL (CANCER CENTER ONLY)
Abs Immature Granulocytes: 0.03 10*3/uL (ref 0.00–0.07)
Basophils Absolute: 0 10*3/uL (ref 0.0–0.1)
Basophils Relative: 0 %
Eosinophils Absolute: 0.1 10*3/uL (ref 0.0–0.5)
Eosinophils Relative: 1 %
HCT: 36 % — ABNORMAL LOW (ref 39.0–52.0)
Hemoglobin: 12.2 g/dL — ABNORMAL LOW (ref 13.0–17.0)
Immature Granulocytes: 0 %
Lymphocytes Relative: 10 %
Lymphs Abs: 0.9 10*3/uL (ref 0.7–4.0)
MCH: 31.7 pg (ref 26.0–34.0)
MCHC: 33.9 g/dL (ref 30.0–36.0)
MCV: 93.5 fL (ref 80.0–100.0)
Monocytes Absolute: 0.1 10*3/uL (ref 0.1–1.0)
Monocytes Relative: 1 %
Neutro Abs: 7.9 10*3/uL — ABNORMAL HIGH (ref 1.7–7.7)
Neutrophils Relative %: 88 %
Platelet Count: 379 10*3/uL (ref 150–400)
RBC: 3.85 MIL/uL — ABNORMAL LOW (ref 4.22–5.81)
RDW: 16.3 % — ABNORMAL HIGH (ref 11.5–15.5)
WBC Count: 9 10*3/uL (ref 4.0–10.5)
nRBC: 0 % (ref 0.0–0.2)

## 2021-07-17 LAB — CMP (CANCER CENTER ONLY)
ALT: 26 U/L (ref 0–44)
AST: 17 U/L (ref 15–41)
Albumin: 3.3 g/dL — ABNORMAL LOW (ref 3.5–5.0)
Alkaline Phosphatase: 226 U/L — ABNORMAL HIGH (ref 38–126)
Anion gap: 7 (ref 5–15)
BUN: 14 mg/dL (ref 8–23)
CO2: 28 mmol/L (ref 22–32)
Calcium: 9.2 mg/dL (ref 8.9–10.3)
Chloride: 105 mmol/L (ref 98–111)
Creatinine: 0.74 mg/dL (ref 0.61–1.24)
GFR, Estimated: >60 mL/min (ref >60)
Glucose, Bld: 126 mg/dL — ABNORMAL HIGH (ref 70–99)
Potassium: 4.4 mmol/L (ref 3.5–5.1)
Sodium: 140 mmol/L (ref 135–145)
Total Bilirubin: 0.5 mg/dL (ref 0.3–1.2)
Total Protein: 6.7 g/dL (ref 6.5–8.1)

## 2021-07-17 LAB — TSH: TSH: 0.819 u[IU]/mL (ref 0.320–4.118)

## 2021-07-17 LAB — T4, FREE: Free T4: 0.84 ng/dL (ref 0.61–1.12)

## 2021-07-17 LAB — MAGNESIUM: Magnesium: 2.2 mg/dL (ref 1.7–2.4)

## 2021-07-17 MED ORDER — OXYCODONE HCL 5 MG PO TABS
5.0000 mg | ORAL_TABLET | ORAL | 0 refills | Status: DC | PRN
  Filled 2021-07-17: qty 60, 10d supply, fill #0

## 2021-07-17 MED FILL — Dexamethasone Sodium Phosphate Inj 100 MG/10ML: INTRAMUSCULAR | Qty: 1 | Status: AC

## 2021-07-17 MED FILL — Fosaprepitant Dimeglumine For IV Infusion 150 MG (Base Eq): INTRAVENOUS | Qty: 5 | Status: AC

## 2021-07-17 NOTE — Assessment & Plan Note (Signed)
His liver enzymes are trending down nicely We will continue to monitor carefully

## 2021-07-17 NOTE — Assessment & Plan Note (Signed)
Overall, he tolerated treatment very well without major side effects I am encouraged to see significant improvement of his liver enzymes and overall health with weight gain His tumor marker is trending down I felt that he is responding well to treatment He is scheduled for CT imaging in 2 weeks

## 2021-07-17 NOTE — Assessment & Plan Note (Signed)
He has intermittent pain in his back from his disease He will continue to take oxycodone as needed His requirement for pain medicine has reduced dramatically and he is not even taking it daily I refilled his prescription today

## 2021-07-17 NOTE — Progress Notes (Signed)
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   All questions were answered. The patient knows to call the clinic with any problems, questions or concerns. The total time spent in the appointment was 20 minutes encounter with patients including review of chart and various tests results, discussions about plan of care and coordination of care plan   Jason Lark, MD 07/17/2021 2:19 PM  INTERVAL HISTORY: Please see below for problem oriented charting. he returns for treatment follow-up with his son [place] interpreter is present He is doing well Denies nausea, changes in bowel habits or neuropathy His back pain is stable/improved Denies bleeding complications from anticoagulation therapy  REVIEW OF SYSTEMS:   Constitutional: Denies fevers, chills or abnormal weight loss Eyes: Denies blurriness of vision Ears, nose, mouth, throat, and face: Denies mucositis or sore throat Respiratory: Denies cough,  dyspnea or wheezes Cardiovascular: Denies palpitation, chest discomfort or lower extremity swelling Gastrointestinal:  Denies nausea, heartburn or change in bowel habits Skin: Denies abnormal skin rashes Lymphatics: Denies new lymphadenopathy or easy bruising Neurological:Denies numbness, tingling or new weaknesses Behavioral/Psych: Mood is stable, no new changes  All other systems were reviewed with the patient and are negative.  I have reviewed the past medical history, past surgical history, social history and family history with the patient and they are unchanged from previous note.  ALLERGIES:  has No Known Allergies.  MEDICATIONS:  Current Outpatient Medications  Medication Sig Dispense Refill   apixaban (ELIQUIS) 5 MG TABS tablet Take 1 tablet (5 mg total) by mouth 2 (two) times daily. 60 tablet 1   lidocaine-prilocaine (EMLA) cream Apply to affected area once 30 g 3   ondansetron (ZOFRAN) 8 MG tablet Take 1 tablet by mouth every 8 hours as needed. 30 tablet 1   oxyCODONE (OXY IR/ROXICODONE) 5 MG immediate release tablet Take 1 tablet (5 mg total) by mouth every 4 hours as needed for severe pain. 60 tablet 0   prochlorperazine (COMPAZINE) 10 MG tablet Take 1 tablet by mouth every 6 hours as needed (Nausea or vomiting). 30 tablet 1   senna-docusate (SENOKOT-S) 8.6-50 MG tablet Take 1 tablet by mouth 2 (two) times daily as needed for mild constipation or moderate constipation.     No current facility-administered medications for this visit.    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 He underwent liver biopsy   05/14/2021 Imaging   RIGHT:  - There is no evidence of deep vein thrombosis in the lower extremity.     - No cystic structure found in the popliteal fossa.     LEFT:  - There is no evidence of deep vein  thrombosis in the lower extremity.     - No cystic structure found in the popliteal fossa.    05/14/2021 Imaging   1. Pathologic split type fracture of T4 with approximately 25% height loss, likely due to metastatic disease. 2. No other evidence of metastatic disease of the cervical, thoracic or lumbar spine. 3. Moderate left L5-S1 neural foraminal stenosis. 4. Left-greater-than-right lateral recess narrowing at L4-5 and L5-S1.     05/14/2021 Imaging   1. 5 cm ill-defined irregular low-density lesion in the anterior right liver inferiorly is highly suspicious for primary or metastatic neoplasm. The lesion obliterates bile ducts in the central right liver resulting in right hepatic lobe biliary dilatation. Similarly, portal venous anatomy in the anterior right liver is involved along the superomedial margin of the lesion. Lesion should be amenable to tissue sampling. 2. Gallbladder wall is contracted and irregular in appearance adjacent to the lesion. Imaging features suggest that gallbladder changes are secondary although primary gallbladder neoplasm not excluded. 3. 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 No evidence for additional metastatic disease in the abdomen or pelvis. 4. Prostatomegaly. 5. Trace free fluid in the pelvis. 6. Aortic Atherosclerosis (ICD10-I70.0).   05/15/2021 Pathology Results   Clinical History: Right hepatic tumor and evidence for bone metastasis ([facility])    FINAL MICROSCOPIC DIAGNOSIS:   A. LIVER, RIGHT, BIOPSY:  -  Adenocarcinoma  -  See comment   COMMENT:   By immunohistochemistry, the neoplastic cells are positive for cytokeratin 7, monoclonal and polyclonal CEA with patchy positivity for cytokeratin 20.  The tumor cells are negative for TTF-1, AFP, prostein, PSA and CDX2.  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     05/15/2021  Procedure   Ultrasound-guided core biopsies of a right hepatic lesion.   05/16/2021 Imaging   MRI brain  Unremarkable appearance of the brain for age. No evidence of intracranial metastases.     05/16/2021 Imaging   Ct chest  1. Study is once again positive for subsegmental sized emboli in the right lower lobe. No larger central, lobar or segmental sized emboli are noted. 2. Lytic lesion in T4 with pathologic fracture, similar to the prior study. This was better depicted on recent thoracic spine MRI. 3. Aortic atherosclerosis, in addition to left main and 3 vessel coronary artery disease. There is also mild ectasia of the ascending thoracic aorta (4.0 cm in diameter).    05/19/2021 Procedure   Ultrasound and fluoroscopically guided right internal jugular single lumen power port catheter insertion. Tip in the SVC/RA junction. Catheter ready for use.   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 Results of the tumor  marker test revealed 1627.     PHYSICAL EXAMINATION: ECOG PERFORMANCE STATUS: 1 - Symptomatic but completely ambulatory  Vitals:   07/17/21 1353  BP: 118/76  Pulse: 86  Resp: 18  Temp: 98.5 F (36.9 C)  SpO2: 99%   Filed Weights   07/17/21 1353  Weight: 142 lb 9.6 oz (64.7 kg)    GENERAL:alert, no distress and comfortable SKIN: skin color, texture, turgor are normal, no rashes or significant lesions EYES: normal, Conjunctiva are pink and non-injected, sclera clear OROPHARYNX:no exudate, no erythema and lips, buccal mucosa, and  tongue normal  NECK: supple, thyroid normal size, non-tender, without nodularity LYMPH:  no palpable lymphadenopathy in the cervical, axillary or inguinal LUNGS: clear to auscultation and percussion with normal breathing effort HEART: regular rate & rhythm and no murmurs and no lower extremity edema ABDOMEN:abdomen soft, non-tender and normal bowel sounds Musculoskeletal:no cyanosis of digits and no clubbing  NEURO: alert & oriented x 3 with fluent speech, no focal motor/sensory deficits  LABORATORY DATA:  I have reviewed the data as listed    Component Value Date/Time   NA 142 07/10/2021 1008   NA 145 (H) 04/28/2018 1054   K 4.7 07/10/2021 1008   CL 105 07/10/2021 1008   CO2 27 07/10/2021 1008   GLUCOSE 95 07/10/2021 1008   BUN 8 07/10/2021 1008   BUN 13 04/28/2018 1054   CREATININE 0.81 07/10/2021 1008   CALCIUM 9.4 07/10/2021 1008   PROT 6.7 07/10/2021 1008   PROT 6.8 04/28/2018 1054   ALBUMIN 3.6 07/10/2021 1008   ALBUMIN 4.4 04/28/2018 1054   AST 18 07/10/2021 1008   ALT 30 07/10/2021 1008   ALKPHOS 380 (H) 07/10/2021 1008   BILITOT 0.3 07/10/2021 1008   GFRNONAA >60 07/10/2021 1008   GFRAA >60 08/22/2018 0904    No results found for: SPEP, UPEP  Lab Results  Component Value Date   WBC 9.0 07/17/2021   NEUTROABS 7.9 (H) 07/17/2021   HGB 12.2 (L) 07/17/2021   HCT 36.0 (L) 07/17/2021   MCV 93.5 07/17/2021   PLT 379 07/17/2021      Chemistry      Component Value Date/Time   NA 142 07/10/2021 1008   NA 145 (H) 04/28/2018 1054   K 4.7 07/10/2021 1008   CL 105 07/10/2021 1008   CO2 27 07/10/2021 1008   BUN 8 07/10/2021 1008   BUN 13 04/28/2018 1054   CREATININE 0.81 07/10/2021 1008      Component Value Date/Time   CALCIUM 9.4 07/10/2021 1008   ALKPHOS 380 (H) 07/10/2021 1008   AST 18 07/10/2021 1008   ALT 30 07/10/2021 1008   BILITOT 0.3 07/10/2021 1008

## 2021-07-17 NOTE — Assessment & Plan Note (Signed)
He will continue anticoagulation therapy indefinitely I plan to add CT imaging of the chest to reassess status of his blood clot in 2 weeks

## 2021-07-18 LAB — CANCER ANTIGEN 19-9: CA 19-9: 910 U/mL — ABNORMAL HIGH (ref 0–35)

## 2021-07-20 ENCOUNTER — Inpatient Hospital Stay: Payer: Managed Care, Other (non HMO)

## 2021-07-20 ENCOUNTER — Other Ambulatory Visit: Payer: Self-pay

## 2021-07-20 VITALS — BP 138/79 | HR 76 | Temp 98.2°F

## 2021-07-20 DIAGNOSIS — Z5112 Encounter for antineoplastic immunotherapy: Secondary | ICD-10-CM | POA: Diagnosis not present

## 2021-07-20 DIAGNOSIS — C249 Malignant neoplasm of biliary tract, unspecified: Secondary | ICD-10-CM

## 2021-07-20 MED ORDER — SODIUM CHLORIDE 0.9 % IV SOLN
10.0000 mg | Freq: Once | INTRAVENOUS | Status: AC
  Administered 2021-07-20: 10 mg via INTRAVENOUS
  Filled 2021-07-20: qty 10

## 2021-07-20 MED ORDER — FAMOTIDINE 20 MG IN NS 100 ML IVPB
20.0000 mg | Freq: Once | INTRAVENOUS | Status: AC
  Administered 2021-07-20: 20 mg via INTRAVENOUS
  Filled 2021-07-20: qty 100

## 2021-07-20 MED ORDER — SODIUM CHLORIDE 0.9% FLUSH
10.0000 mL | INTRAVENOUS | Status: DC | PRN
  Administered 2021-07-20: 10 mL

## 2021-07-20 MED ORDER — POTASSIUM CHLORIDE IN NACL 20-0.9 MEQ/L-% IV SOLN
Freq: Once | INTRAVENOUS | Status: AC
  Filled 2021-07-20: qty 1000

## 2021-07-20 MED ORDER — SODIUM CHLORIDE 0.9 % IV SOLN
150.0000 mg | Freq: Once | INTRAVENOUS | Status: AC
  Administered 2021-07-20: 150 mg via INTRAVENOUS
  Filled 2021-07-20: qty 150

## 2021-07-20 MED ORDER — HEPARIN SOD (PORK) LOCK FLUSH 100 UNIT/ML IV SOLN
500.0000 [IU] | Freq: Once | INTRAVENOUS | Status: AC | PRN
Start: 2021-07-20 — End: 2021-07-20
  Administered 2021-07-20: 500 [IU]

## 2021-07-20 MED ORDER — SODIUM CHLORIDE 0.9 % IV SOLN: Freq: Once | INTRAVENOUS | Status: AC

## 2021-07-20 MED ORDER — MAGNESIUM SULFATE 2 GM/50ML IV SOLN
2.0000 g | Freq: Once | INTRAVENOUS | Status: AC
  Administered 2021-07-20: 2 g via INTRAVENOUS
  Filled 2021-07-20: qty 50

## 2021-07-20 MED ORDER — SODIUM CHLORIDE 0.9 % IV SOLN
1000.0000 mg/m2 | Freq: Once | INTRAVENOUS | Status: AC
  Administered 2021-07-20: 1672 mg via INTRAVENOUS
  Filled 2021-07-20: qty 43.97

## 2021-07-20 MED ORDER — SODIUM CHLORIDE 0.9 % IV SOLN
25.0000 mg/m2 | Freq: Once | INTRAVENOUS | Status: AC
  Administered 2021-07-20: 42 mg via INTRAVENOUS
  Filled 2021-07-20: qty 42

## 2021-07-20 MED ORDER — PALONOSETRON HCL INJECTION 0.25 MG/5ML
0.2500 mg | Freq: Once | INTRAVENOUS | Status: AC
  Administered 2021-07-20: 0.25 mg via INTRAVENOUS
  Filled 2021-07-20: qty 5

## 2021-07-20 NOTE — Patient Instructions (Signed)
[facility]  Discharge Instructions: Thank you for choosing [facility] to provide your oncology and hematology care.   If you have a lab appointment with the [facility], please go directly to the [facility] and check in at the registration area.   Wear comfortable clothing and clothing appropriate for easy access to any Portacath or PICC line.   We strive to give you quality time with your provider. You may need to reschedule your appointment if you arrive late (15 or more minutes).  Arriving late affects you and other patients whose appointments are after yours.  Also, if you miss three or more appointments without notifying the office, you may be dismissed from the clinic at the provider's discretion.      For prescription refill requests, have your pharmacy contact our office and allow 72 hours for refills to be completed.    Today you received the following chemotherapy and/or immunotherapy agents Gemzar & Cisplatin      To help prevent nausea and vomiting after your treatment, we encourage you to take your nausea medication as directed.  BELOW ARE SYMPTOMS THAT SHOULD BE REPORTED IMMEDIATELY: *FEVER GREATER THAN 100.4 F (38 C) OR HIGHER *CHILLS OR SWEATING *NAUSEA AND VOMITING THAT IS NOT CONTROLLED WITH YOUR NAUSEA MEDICATION *UNUSUAL SHORTNESS OF BREATH *UNUSUAL BRUISING OR BLEEDING *URINARY PROBLEMS (pain or burning when urinating, or frequent urination) *BOWEL PROBLEMS (unusual diarrhea, constipation, pain near the anus) TENDERNESS IN MOUTH AND THROAT WITH OR WITHOUT PRESENCE OF ULCERS (sore throat, sores in mouth, or a toothache) UNUSUAL RASH, SWELLING OR PAIN  UNUSUAL VAGINAL DISCHARGE OR ITCHING   Items with * indicate a potential emergency and should be followed up as soon as possible or go to the Emergency Department if any problems should occur.  Please show the CHEMOTHERAPY ALERT CARD or IMMUNOTHERAPY ALERT CARD at  check-in to the Emergency Department and triage nurse.  Should you have questions after your visit or need to cancel or reschedule your appointment, please contact [facility]  Dept: [phone]  and follow the prompts.  Office hours are 8:00 a.m. to 4:30 p.m. Monday - Friday. Please note that voicemails left after 4:00 p.m. may not be returned until the following business day.  We are closed weekends and major holidays. You have access to a nurse at all times for urgent questions. Please call the main number to the clinic Dept: [phone] and follow the prompts.   For any non-urgent questions, you may also contact your provider using MyChart. We now offer e-Visits for anyone 67 and older to request care online for non-urgent symptoms. For details visit [email or web address].   Also download the MyChart app! Go to the app store, search "MyChart", open the app, select [facility], and log in with your MyChart username and password.  Due to Covid, a mask is required upon entering the hospital/clinic. If you do not have a mask, one will be given to you upon arrival. For doctor visits, patients may have 1 support person aged 67 or older with them. For treatment visits, patients cannot have anyone with them due to current Covid guidelines and our immunocompromised population.   Gemcitabine injection ?y l thu?c g? GEMCITABINE l thu?c ha tr? li?u. Thu?c ny ???c dng ?? ?i?u tr? nhi?u lo?i ung th?, ch?ng h?n nh? ung th? v, ung th? ph?i, ung th? tuy?n t?y, v ung th? bu?ng tr?ng. Thu?c ny c th? ???c dng  cho nh?ng m?c ?ch khc; hy h?i ng??i cung c?p d?ch v? y t? ho?c d??c s? c?a mnh, n?u qu v? c th?c m?c. (CC) NHN HI?U PH? BI?N: Gemzar, Infugem Ti c?n ph?i bo cho ng??i cung c?p d?ch v? y t? c?a mnh ?i?u g tr??c khi dng thu?c ny? H? c?n bi?t li?u qu v? c b?t k? tnh tr?ng no sau ?y khng: cc r?i lo?n v? mu nhi?m trng b?nh th?n b?nh  gan b?nh ph?i ho??c h h?p, ch??ng ha?n nh? hen suy?n m?i x? tr? g?n ?y ho?c ?ang x? tr? pha?n ??ng b?t th???ng ho??c di? ??ng v??i gemcitabine ho?c thu?c ha tr? li?u khc pha?n ??ng b?t th???ng ho??c di? ??ng v??i ca?c d??c ph?m kha?c, th?c ph?m, thu?c nhu?m, ho??c ch?t ba?o qua?n ?ang c thai ho??c ??nh co? thai ?ang cho con bu? Ti nn s? d?ng thu?c ny nh? th? no? Thu?c ny ?? truy?n vo t?nh m?ch. Thu?c ny ???c cho trong b?nh vi?n ho?c phng m?ch b?i chuyn vin y t? ???c hu?n luy?n ??c bi?t. Hy bn v?i bc s? nhi khoa c?a qu v? v? vi?c dng thu?c ny ? tr? em. C th? c?n ch?m [place] ??c bi?t. Qu li?u: N?u qu v? cho r?ng mnh ? dng qu nhi?u thu?c ny, th hy lin l?c v?i trung tm ki?m sot ch?t ??c ho?c phng c?p c?u ngay l?p t?c. L?U : Thu?c ny ch? dnh ring cho qu v?. Khng chia s? thu?c ny v?i nh?ng ng??i khc. N?u ti l? qun m?t li?u th sao? ?i?u quan tr?ng l khng nn b? l? li?u thu?c no. Hy lin l?c v?i bc s? ho?c [place] vin y t? c?a mnh, n?u qu v? khng th? gi? ?ng cu?c h?n khm. Nh?ng g c th? t??ng tc v?i thu?c ny? m?t s? thu?c lm t?ng s? l??ng t? bo mu, ch?ng h?n nh? filgrastim, pegfilgrastim, sargramostim m?t s? thu?c ha tr? li?u khc, ch?ng h?n nh? cisplatin cc thu?c ch?ng ng?a Hy bo cho bc s? ho?c chuyn vin y t? c?a mnh tr??c khi dng b?t k? thu?c no trong s? nh?ng thu?c ny: acetaminophen aspirin ibuprofen ketoprofen naproxen Danh sch ny c th? khng m t? ?? h?t cc t??ng tc c th? x?y ra. Hy ??a cho ng??i cung c?p d?ch v? y t? c?a mnh danh sch t?t c? cc thu?c, th?o d??c, cc thu?c khng c?n toa, ho?c cc ch? ph?m b? sung m qu v? dng. C?ng nn bo cho h? bi?t r?ng qu v? c ht thu?c, u?ng r??u, ho?c c s? d?ng ma ty tri php hay khng. Vi th? c th? t??ng tc v?i thu?c c?a qu v?. Ti c?n ph?i theo di ?i?u g trong khi dng thu?c ny? Hy ?i g?p bc s? ho?c [place] vin y t? ?? theo di ??nh k? s? c?i thi?n c?a  qu v?. Thu?c ny c th? lm cho qu v? c?m th?y khng ???c kh?e nh? th??ng l?. ?i?u ny khng ph?i khng ph? bi?n, b?i v thu?c ha tr? li?u c th? ?nh h??ng ??n c? t? bo lnh l?n t? bo ung th?. Hy t??ng trnh m?i tc d?ng ph?. Hy ti?p t?c ??t ?i?u tr? c?a mnh ngay c? khi qu v? c?m th?y m?t, tr? khi bc s? yu c?u qu v? ng?ng ?i?u tr?[place] m?t s? tr??ng h?p, qu v? c th? ???c cho dng cc thu?c ph? thm ?? gip gi?m tc d?ng ph?. Hy lm theo t?t c? cc h??ng d?n v? vi?c s? d?ng  chng. Hy h?i  ki?n bc s? ho?c chuyn vin y t?, n?u qu v? b? s?t, ?n l?nh ho?c ?au h?ng, ho?c c cc tri?u ch?ng khc c?a c?m l?nh ho?c cm. Khng ???c t? ?i?u tr? cho mnh. Thu?c ny c th? lm gi?m kh? n?ng ch?ng l?i cc b?nh nhi?m trng c?a c? th?. Hy c? trnh ? g?n nh?ng ng??i b? b?nh. Thu?c ny c th? lm t?ng nguy c? b? b?m tm ho?c ch?y mu. Hy lin l?c v?i bc s? ho?c chuyn vin y t?, n?u qu v? th?y ch?y mu b?t th??ng. Hy c?n th?n khi ?nh r?ng ho?c x?a r?ng b?ng ch? nha khoa ho?c b?ng t?m, b?i v qu v? c th? d? b? nhi?m trng ho?c d? b? ch?y mu h?n. N?u qu v? c ?i lm r?ng, th hy bo v?i nha s? r?ng qu v? ?ang dng thu?c ny. Trnh dng cc thu?c c ch?a aspirin, acetaminophen, ibuprofen, naproxen, ho?c ketoprofen, tr? khi ? ???c bc s? ch? d?n. Cc thu?c ny c th? che l?p tri?u ch?ng s?t. Ph? n? dng thu?c ny ph?i trnh c thai trong khi dng thu?c v 6 thng sau khi ng?ng dng thu?c. Ph? n? c?n ph?i thng bo cho bc s? c?a mnh, n?u mu?n c thai ho?c ngh? r?ng c th? mnh ? c [place]. Nam gi??i khng nn gy thu? thai trong khi du?ng thu?c na?y va? trong vng 3 tha?ng sau khi ng?ng thu?c. C nguy c? v? cc tc d?ng ph? nghim tr?ng ??i v?i [place] nhi. Hy th?o lu?n v?i bc s? ho?c chuyn vin y t? ho?c d??c s? ?? bi?t thm thng tin. Khng ???c cho tr? b m? trong khi ?ang dng thu?c ny ho?c t?i thi?u 1 tu?n sau khi ng?ng dng thu?c. Nam gi?i c?n ph?i thng bo cho bc s? c?a mnh, n?u h?  mu?n ???c lm b?. Thu?c ny c th? lm gi?m s? l??ng tinh trng. Hy th?o lu?n v?i bc s? ho?c [place] vin y t? c?a mnh, n?u qu v? lo l??ng v? kha? n?ng thu? tinh c?a mnh. Ti c th? nh?n th?y nh?ng tc d?ng ph? no khi dng thu?c ny? Nh?ng tc d?ng ph? qu v? c?n ph?i bo cho bc s? ho?c chuyn vin y t? cng s?m cng t?t: cc ph?n ?ng d? ?ng, ch?ng h?n nh? da b? m?n ??, ng?a, n?i my ?ay, s?ng ? m?t, mi, ho?c l??i kh th? ?au, ??, ng?a, ho?c kch ?ng ? ch? tim cc d?u hi?u v tri?u ch?ng c?a s? thay ??i nguy hi?m c?a nh?p tim, ch?ng h?n nh? ?au ng?c, chng m?t, nh?p tim nhanh ho?c khng ??u, ?nh tr?ng ng?c; c?m th?y chong vng ho?c ng?t x?u, b? t; cc v?n ?? v? h h?p cc d?u hi?u gi?m ti?u c?u ho?c xu?t huy?t - b?m tm, cc n?t l?m t?m ?? trn da, phn c mu ?en, mu h?c n, c mu trong n??c ti?u cc d?u hi?u gi?m s? l??ng t? bo h?ng c?u - c?m th?y y?u ?t ho?c m?t m?i m?t cch b?t th??ng, cc c?n ng?t x?u, chong vng cc d?u hi?u nhi?m trng - s?t ho?c ?n l?nh, ho, ?au h?ng, kh ?i ti?u ho?c ?i ti?u ?au cc d?u hi?u v tri?u ch?ng t?n th??ng th?n, nh? kh ?i ti?u ho?c thay ??i l??ng n??c ti?u cc d?u hi?u v tri?u ch?ng t?n th??ng gan, ch?ng h?n nh? n??c ti?u mu nu ho?c vng s?m; c?m gic b? b?nh ki?u chung chung ho?c cc tri?u ch?ng gi?ng nh? cm;  phn b?c mu; m?t c?m gic ngon mi?ng; bu?n i; ?au vng b?ng trn; y?u ?t ho?c m?t m?i khc th??ng; vng da ho?c m?t s?ng ? m?t c chn, bn chn, ho?c bn tay Cc tc d?ng ph? khng c?n ph?i ch?m [place] y t? (hy bo cho bc s? ho?c chuyn vin y t?, n?u cc tc d?ng ph? ny ti?p di?n ho?c gy phi?n toi): to bn tiu ch?y r?ng tc m?t c?m gic ngon mi?ng bu?n i n?i ban i m?a Danh sch ny c th? khng m t? ?? h?t cc tc d?ng ph? c th? x?y ra. Xin g?i t?i bc s? c?a mnh ?? ???c c? v?n chuyn mn v? cc tc d?ng ph?Sander Nephew v? c th? t??ng trnh cc tc d?ng ph? cho FDA theo s? [phone]. Ti nn c?t gi? thu?c c?a mnh ?  ?u? Thu?c ny ???c s? d?ng b?i chuyn vin y t? ? b?nh vi?n ho?c ? phng m?ch. Qu v? s? khng ???c c?p thu?c ny ?? c?t gi? t?i nh. L?U : ?y l b?n tm t?t. N c th? khng bao hm t?t c? thng tin c th? c. N?u qu v? th?c m?c v? thu?c ny, xin trao ??i v?i bc s?, d??c s?, ho?c ng??i cung c?p d?ch v? y t? c?a mnh.  2022 Elsevier/Gold Standard (2018-02-07 00:00:00)  Cisplatin injection ?y l thu?c g? CISPLATIN l thu?c ha tr? li?u. N nh?m ??n cc t? bo phn chia nhanh, nh? cc t? bo ung th?, v lm cho cc t? bo ? ch?t. Thu?c ny ???c dng ?? ?i?u tr? nhi?u lo?i ung th?, ch?ng h?n nh? ung th? b?ng ?i, ung th? bu?ng tr?ng, v [place] th? tinh hon. Thu?c ny c th? ???c dng cho nh?ng m?c ?ch khc; hy h?i ng??i cung c?p d?ch v? y t? ho?c d??c s? c?a mnh, n?u qu v? c th?c m?c. (CC) NHN HI?U PH? BI?N: Platinol, Platinol -AQ Ti c?n ph?i bo cho ng??i cung c?p d?ch v? y t? c?a mnh ?i?u g tr??c khi dng thu?c ny? H? c?n bi?t li?u qu v? c b?t k? tnh tr?ng no sau ?y khng: b?nh v? m?t, cc v?n ?? v? th? l?c lng tai b?nh th?n c s? l??ng t? bo mu th?p, ch?ng h?n nh? s? l??ng b?ch c?u, ti?u c?u ho?c h?ng c?u th?p c?m gic nh? b? ki?n b ? ngn tay ho?c ngn chn, ho?c cc r?i lo?n th?n kinh khc pha?n ??ng b?t th???ng ho??c di? ??ng v??i cisplatin, carboplatin, ho?c oxaliplatin pha?n ??ng b?t th???ng ho??c di? ??ng v??i ca?c d??c ph?m kha?c, th?c ph?m, thu?c nhu?m, ho??c ch?t ba?o qua?n ?ang c thai ho??c ??nh co? thai ?ang cho con bu? Ti nn s? d?ng thu?c ny nh? th? no? Thu?c ny ?? truy?n vo t?nh m?ch. Thu?c ny ???c cho trong b?nh vi?n ho?c phng m?ch b?i chuyn vin y t? ???c hu?n luy?n ??c bi?t. Hy bn v?i bc s? nhi khoa c?a qu v? v? vi?c dng thu?c ny ? tr? em. C th? c?n ch?m [place] ??c bi?t. Qu li?u: N?u qu v? cho r?ng mnh ? dng qu nhi?u thu?c ny, th hy lin l?c v?i trung tm ki?m sot ch?t ??c ho?c phng c?p c?u ngay l?p t?c. L?U : Thu?c  ny ch? dnh ring cho qu v?. Khng chia s? thu?c ny v?i nh?ng ng??i khc. N?u ti l? qun m?t li?u th sao? ?i?u quan tr?ng l khng nn b? l? li?u thu?c no. Hy lin l?c v?i bc s? ho?c  chuyn vin y t? c?a mnh, n?u qu v? khng th? gi? ?ng cu?c h?n khm. Nh?ng g c th? t??ng tc v?i thu?c ny? Thu?c ny c th? t??ng tc v?i cc thu?c sau ?y: foscarnet m?t s? thu?c khng sinh, ch?ng h?n nh? amikacin, gentamicin, neomycin, polymyxin B, streptomycin, tobramycin, vancomycin Danh sch ny c th? khng m t? ?? h?t cc t??ng tc c th? x?y ra. Hy ??a cho ng??i cung c?p d?ch v? y t? c?a mnh danh sch t?t c? cc thu?c, th?o d??c, cc thu?c khng c?n toa, ho?c cc ch? ph?m b? sung m qu v? dng. C?ng nn bo cho h? bi?t r?ng qu v? c ht thu?c, u?ng r??u, ho?c c s? d?ng ma ty tri php hay khng. Vi th? c th? t??ng tc v?i thu?c c?a qu v?. Ti c?n ph?i theo di ?i?u g trong khi dng thu?c ny? Qu v? s? ???c theo di ch?t ch? trong khi dng thu?c ny. Qu v? s? c?n ph?i ?i lm cc xt nghi?m mu quan tr?ng trong th?i gian dng thu?c ny. Thu?c ny c th? lm cho qu v? c?m th?y khng ???c kh?e nh? th??ng l?. ?i?u ny khng ph?i khng ph? bi?n, b?i v thu?c ha tr? li?u c th? ?nh h??ng ??n c? t? bo lnh l?n t? bo ung th?. Hy t??ng trnh m?i tc d?ng ph?. Hy ti?p t?c ??t ?i?u tr? c?a mnh ngay c? khi qu v? c?m th?y m?t, tr? khi bc s? yu c?u qu v? ng?ng ?i?u tr?. Thu?c ny c th? lm t?ng nguy c? b? nhi?m trng. Hy h?i  ki?n bc s? ho?c [place] vin y t? c?a mnh, n?u qu v? b? s?t, ?n l?nh, ho?c b? ?au h?ng, hay c cc tri?u ch?ng khc c?a cm hay c?m l?nh. Khng ???c t? ?i?u tr? cho mnh. Hy c? trnh ? g?n nh?ng ng??i b? b?nh. Trnh dng cc thu?c c ch?a aspirin, acetaminophen, ibuprofen, naproxen ho?c ketoprofen, tr? khi ???c bc s? ho?c chuyn vin y t? c?a mnh ch? d?n. Cc thu?c ny c th? che l?p tri?u ch?ng s?t. Thu?c ny c th? lm t?ng nguy c? b? b?m tm ho?c ch?y mu. Hy  lin l?c v?i bc s? ho?c chuyn vin y t?, n?u qu v? th?y ch?y mu b?t th??ng. Hy c?n th?n khi ?nh r?ng ho?c x?a r?ng b?ng ch? nha khoa ho?c b?ng t?m, b?i v qu v? c th? d? b? nhi?m trng ho?c d? b? ch?y mu h?n. N?u qu v? c ?i lm r?ng, th hy bo v?i nha s? r?ng qu v? ?ang dng thu?c ny. Khng ???c c thai khi ?ang dng thu?c ny, ho?c trong vng 14 thng sau khi ng?ng dng thu?c. Ph? n? nn bo cho [place] vin y t? c?a mnh bi?t, n?u mu?n c thai ho?c ngh? r?ng c th? mnh ? c [place]. Nam gi??i khng nn gy thu? thai trong khi du?ng thu?c na?y va? trong vng 11 tha?ng sau khi ng?ng dng thu?c. C nguy c? v? cc tc d?ng ph? nghim tr?ng ??i v?i [place] nhi. Hy th?o lu?n v?i bc s? ho?c chuyn vin y t? ?? bi?t thm thng tin. Khng ???c nui con b?ng s?a m? trong khi dng thu?c ny. Thu?c ny ?a? gy suy bu?ng tr??ng ?? m?t s? phu? n??. Thu?c ny c th? lm kh ??u thai h?n. Hy th?o lu?n v?i bc s? ho?c [place] vin y t? c?a mnh, n?u qu v? lo l??ng v? kha? n?ng thu? tinh c?a mnh. Thu?c  ny ? gy gi?m s? l??ng tinh trng ? m?t s? v? nam gi?i. Thu?c ny c th? lm kh gy th? thai h?n. Hy th?o lu?n v?i bc s? ho?c [place] vin y t? c?a mnh, n?u qu v? lo l??ng v? kha? n?ng thu? tinh c?a mnh. Hy u?ng cc ch?t l?ng nh? ? ???c ch? d?n trong khi qu v? dng thu?c ny. ?i?u ny s? gip b?o v? th?n. Hy lin l?c v?i bc s? ho?c chuyn vin y t? n?u qu v? b? tiu ch?y. Khng ???c t? ?i?u tr? cho mnh. Ti c th? nh?n th?y nh?ng tc d?ng ph? no khi dng thu?c ny? Nh?ng tc d?ng ph? qu v? c?n ph?i bo cho bc s? ho?c chuyn vin y t? cng s?m cng t?t: cc ph?n ?ng d? ?ng, ch?ng h?n nh? da b? m?n ??, ng?a, n?i my ?ay, s?ng ? m?t, mi, ho?c l??i m? m?t thay ??i th? l?c b? gi?m thnh l?c ho?c  tai bu?n i ho?c i m?a ?au, ??, ng?a, ho?c kch ?ng ? ch? tim ?au, t ho?c c?m gic nh? b? ki?n b ? bn tay ho?c bn chn c cc d?u hi?u v tri?u ch?ng c?a tnh tr?ng xu?t huy?t, nh? l  phn c mu ho?c c mu ?en, mu h?c n; n??c ti?u mu ?? ho?c mu nu s?m; kh?c ra mu ho?c ra ch?t mu nu gi?ng nh? b?t c ph; cc ??m ?? trn da, cc v?t b?m tm ho?c ch?y mu b?t th??ng ? m?t; n??u r?ng ho?c m?i c cc d?u hi?u v tri?u ch?ng c?a nhi?m trng, ch?ng h?n nh? b? s?t; ?n l?nh; ho; ?au h?ng; kh ?i ti?u ho?c ?i ti?u ?au cc d?u hi?u v tri?u ch?ng t?n th??ng th?n, nh? kh ?i ti?u ho?c thay ??i l??ng n??c ti?u c cc d?u hi?u v tri?u ch?ng b? th?p t? bo h?ng c?u ho?c thi?u mu, ch?ng h?n nh? c?m th?y y?u ?t ho?c m?t m?i khc th??ng; b? ng?t x?u ho?c c?m th?y chong vng; b? ng; c cc v?n ?? v? h h?p Cc tc d?ng ph? khng c?n ph?i ch?m [place] y t? (hy bo cho bc s? ho?c chuyn vin y t?, n?u cc tc d?ng ph? ny ti?p di?n ho?c gy phi?n toi): m?t c?m gic ngon mi?ng lot mi?ng co th?t c? b?p ho?c v?p b? Danh sch ny c th? khng m t? ?? h?t cc tc d?ng ph? c th? x?y ra. Xin g?i t?i bc s? c?a mnh ?? ???c c? v?n chuyn mn v? cc tc d?ng ph?Sander Nephew v? c th? t??ng trnh cc tc d?ng ph? cho FDA theo s? [phone]. Ti nn c?t gi? thu?c c?a mnh ? ?u? Thu?c ny ???c s? d?ng b?i chuyn vin y t? ? b?nh vi?n ho?c ? phng m?ch. Qu v? s? khng ???c c?p thu?c ny ?? c?t gi? t?i nh. L?U : ?y l b?n tm t?t. N c th? khng bao hm t?t c? thng tin c th? c. N?u qu v? th?c m?c v? thu?c ny, xin trao ??i v?i bc s?, d??c s?, ho?c ng??i cung c?p d?ch v? y t? c?a mnh.  2022 Elsevier/Gold Standard (2020-01-31 00:00:00)

## 2021-07-22 ENCOUNTER — Inpatient Hospital Stay: Payer: Managed Care, Other (non HMO)

## 2021-07-22 ENCOUNTER — Other Ambulatory Visit: Payer: Self-pay

## 2021-07-22 ENCOUNTER — Other Ambulatory Visit: Payer: Self-pay | Admitting: Hematology and Oncology

## 2021-07-22 VITALS — BP 127/81 | HR 69 | Temp 98.6°F | Resp 17

## 2021-07-22 DIAGNOSIS — C249 Malignant neoplasm of biliary tract, unspecified: Secondary | ICD-10-CM

## 2021-07-22 DIAGNOSIS — Z5112 Encounter for antineoplastic immunotherapy: Secondary | ICD-10-CM | POA: Diagnosis not present

## 2021-07-22 MED ORDER — PEGFILGRASTIM-BMEZ 6 MG/0.6ML ~~LOC~~ SOSY
6.0000 mg | PREFILLED_SYRINGE | Freq: Once | SUBCUTANEOUS | Status: AC
  Administered 2021-07-22: 6 mg via SUBCUTANEOUS
  Filled 2021-07-22: qty 0.6

## 2021-07-22 NOTE — Patient Instructions (Signed)
Pegfilgrastim injection ?y l thu?c g? PEGFILGRASTIM l y?u t? kch thch dng b?ch c?u h?t (granulocyte colony-stimulating factor) tc d?ng ko di c tc d?ng kch thch s? sinh tr??ng c?a b?ch c?u trung tnh (neutrophil), l m?t lo?i t? bo b?ch c?u quan tr?ng trong kh? n?ng khng nhi?m trng c?a c? th?. Thu?c ???c dng ?? gi?m t? l? b? s?t v nhi?m trng ? nh?ng b?nh nhn b? m?t s? lo?i ung th? no ? ?ang ???c ha tr? li?u gy ?nh h??ng ??n t?y x??ng, v ?? t?ng kh? n?ng s?ng st sau khi b? ph?i nhi?m v?i phng x? li?u cao. Thu?c ny c th? ???c dng cho nh?ng m?c ?ch khc; hy h?i ng??i cung c?p d?ch v? y t? ho?c d??c s? c?a mnh, n?u qu v? c th?c m?c. (CC) NHN HI?U PH? BI?N: Fulphila, Neulasta, Nyvepria, UDENYCA, Ziextenzo Ti c?n ph?i bo cho ng??i cung c?p d?ch v? y t? c?a mnh ?i?u g tr??c khi dng thu?c ny? H? c?n bi?t li?u qu v? c b?t k? tnh tr?ng no sau ?y khng: b?nh th?n d? ?ng v?i latex ?ang ???c x? tr? b?nh h?ng c?u hnh l??i li?m cc ph?n ?ng c?a da ??i v?i keo dnh acrylic (ch? ring D?ng C? Tim Dn Ln C? th? - On-body Injector) ph?n ?ng b?t th??ng ho?c d? ?ng v?i pegfilgrastim ho?c filgrastim pha?n ??ng b?t th???ng ho??c di? ??ng v??i ca?c d??c ph?m kha?c, th?c ph?m, thu?c nhu?m, ho??c ch?t ba?o qua?n ?ang c thai ho??c ??nh co? thai ?ang cho con bu? Ti nn s? d?ng thu?c ny nh? th? no? Thu?c ny ?? tim d??i da. N?u qu v? nh?n thu?c ny ? nh, th qu v? s? ???c by cch chu?n b? v dng cc ?ng tim ? ???c n?p s?n ho?c cch dng D?ng C? Tim Dn Ln C? Th? Associate Professor). Tham kh?o b?n H??ng d?n S? d?ng dnh cho b?nh nhn ?? ???c h??ng d?n chi ti?t. Hy s? d?ng ?ng nh? ? ???c ch? d?n. Hy bo ngay cho bc s? ho?c chuyn vin y t?, n?u qu v? nghi ng? r?ng D?ng C? Tim Dn Ln C? th? khng ho?t ??ng nh? d? ki?n, ho?c n?u qu v? nghi ng? r?ng vi?c s? d?ng D?ng C? Tim Dn Ln C? th? ? khi?n cho li?u dng b? thi?u ho?c khng ??y ??. ?i?u quan  tr?ng l qu v? ph?i b? kim tim v ?ng tim ? dng vo thng chuyn ??ng v?t bn nh?n. Khng ???c b? chng vo thng rc thng th??ng. N?u qu v? khng c thng chuyn ??ng v?t bn nh?n, th hy g?i t?i d??c s? ho?c [place] vin y t? c?a mnh ?? xin m?t ci. Hy bn v?i bc s? nhi khoa c?a qu v? v? vi?c dng thu?c ny ? tr? em. Thu?c ny c th? ???c k toa trong nh?ng tr??ng h?p ch?n l?c, nh?ng c?n ph?i th?n tr?ng. Qu li?u: N?u qu v? cho r?ng mnh ? dng qu nhi?u thu?c ny, th hy lin l?c v?i trung tm ki?m sot ch?t ??c ho?c phng c?p c?u ngay l?p t?c. L?U : Thu?c ny ch? dnh ring cho qu v?. Khng chia s? thu?c ny v?i nh?ng ng??i khc. N?u ti l? qun m?t li?u th sao? ?i?u quan tr?ng l khng nn b? l? li?u thu?c no. Hy lin l?c v?i bc s? ho?c chuyn vin y t? c?a mnh n?u qu v? l? qun m?t li?u thu?c. N?u qu v? b? l? m?t li?u thu?c do D?ng C? Tim  Dn Ln C? Th? Associate Professor) b? tr?c tr?c ho?c b? r r?, th c?n ph?i dng ?ng tim lo?i s? d?ng b?ng tay c n?p s?n m?t li?u thu?c ?? tim m?t li?u thu?c m?i cng s?m cng t?t. Nh?ng g c th? t??ng tc v?i thu?c ny? Ch?a co? nghin c??u v? t??ng ta?c thu?c. Danh sch ny c th? khng m t? ?? h?t cc t??ng tc c th? x?y ra. Hy ??a cho ng??i cung c?p d?ch v? y t? c?a mnh danh sch t?t c? cc thu?c, th?o d??c, cc thu?c khng c?n toa, ho?c cc ch? ph?m b? sung m qu v? dng. C?ng nn bo cho h? bi?t r?ng qu v? c ht thu?c, u?ng r??u, ho?c c s? d?ng ma ty tri php hay khng. Vi th? c th? t??ng tc v?i thu?c c?a qu v?. Ti c?n ph?i theo di ?i?u g trong khi dng thu?c ny? Qu v? s? ???c theo di ch?t ch? trong khi dng thu?c ny. Qu v? s? c?n ?i lm cc xt nghi?m mu ??nh k? trong khi qu v? dng thu?c ny. Hy th?o lu?n v?i bc s? ho?c [place] vin y t? v? nguy c? m?c b?nh ung th? c?a mnh. Qu v? c th? c nguy c? cao b? m?c m?t s? b?nh ung th?, n?u dng thu?c ny. N?u qu v? s?p ph?i ch?p MRI, ch?p CT scan, ho?c lm  th? thu?t khc, th hy bo cho bc s? c?a mnh bi?t r?ng qu v? ?ang dng thu?c ny (Ch? ring D?ng C? Tim Dn Ln C? th? - On-body Injector). Ti c th? nh?n th?y nh?ng tc d?ng ph? no khi dng thu?c ny? Nh?ng tc d?ng ph? qu v? c?n ph?i bo cho bc s? ho?c chuyn vin y t? cng s?m cng t?t: cc ph?n ?ng d? ?ng (da b? m?n ??, ng?a, ho?c n?i my ?ay, s?ng ? m?t, mi ho?c l??i) ?au l?ng chng m?t s?t ?au, ??, ng?a, ho?c kch ?ng ? ch? tim cc n?t l?m t?m ?? trn da n??c ti?u c mu ?? ho?c mu nu s?m kh th? ho?c cc v?n ?? v? h h?p ?au b?ng ho?c ?au bn m?ng s??n, ho?c ?au vai s?ng c?m th?y m?t m?i kh ?i ti?u ho?c thay ??i l??ng n??c ti?u ???c bi ti?t b? b?m tm ho?c xu?t huy?t b?t th??ng Cc tc d?ng ph? khng c?n ph?i ch?m [place] y t? (hy bo cho bc s? ho?c chuyn vin y t?, n?u cc tc d?ng ph? ny ti?p di?n ho?c gy phi?n toi): ?au x??ng ?au ho?c nh?c c? b?p Danh sch ny c th? khng m t? ?? h?t cc tc d?ng ph? c th? x?y ra. Xin g?i t?i bc s? c?a mnh ?? ???c c? v?n chuyn mn v? cc tc d?ng ph?Sander Nephew v? c th? t??ng trnh cc tc d?ng ph? cho FDA theo s? [phone]. Ti nn c?t gi? thu?c c?a mnh ? ?u? ?? ngoi t?m tay tr? em. N?u ?ang dng thu?c ny t?i nh, th qu v? s? ???c ch? d?n cch c?t gi? thu?c. V?t b? t?t c? thu?c ch?a dng sau ngy h?t h?n in trn nhn thu?c ho?c bao thu?c. L?U : ?y l b?n tm t?t. N c th? khng bao hm t?t c? thng tin c th? c. N?u qu v? th?c m?c v? thu?c ny, xin trao ??i v?i bc s?, d??c s?, ho?c ng??i cung c?p d?ch v? y t? c?a mnh.  2022 [employer] (2021-01-05 00:00:00)

## 2021-07-29 ENCOUNTER — Other Ambulatory Visit: Payer: Self-pay

## 2021-07-29 ENCOUNTER — Ambulatory Visit (HOSPITAL_COMMUNITY)
Admission: RE | Admit: 2021-07-29 | Discharge: 2021-07-29 | Disposition: A | Discharge status: Home / Self Care | Payer: Managed Care, Other (non HMO) | Source: Ambulatory Visit | Attending: Hematology and Oncology | Admitting: Hematology and Oncology

## 2021-07-29 DIAGNOSIS — C7951 Secondary malignant neoplasm of bone: Secondary | ICD-10-CM | POA: Insufficient documentation

## 2021-07-29 DIAGNOSIS — I2699 Other pulmonary embolism without acute cor pulmonale: Secondary | ICD-10-CM | POA: Diagnosis present

## 2021-07-29 DIAGNOSIS — C249 Malignant neoplasm of biliary tract, unspecified: Secondary | ICD-10-CM | POA: Diagnosis present

## 2021-07-29 MED ORDER — IOHEXOL 350 MG/ML SOLN
80.0000 mL | Freq: Once | INTRAVENOUS | Status: AC | PRN
  Administered 2021-07-29: 80 mL via INTRAVENOUS

## 2021-07-31 ENCOUNTER — Inpatient Hospital Stay (HOSPITAL_BASED_OUTPATIENT_CLINIC_OR_DEPARTMENT_OTHER): Payer: Managed Care, Other (non HMO) | Admitting: Hematology and Oncology

## 2021-07-31 ENCOUNTER — Encounter: Payer: Self-pay | Admitting: Hematology and Oncology

## 2021-07-31 ENCOUNTER — Other Ambulatory Visit: Payer: Self-pay

## 2021-07-31 ENCOUNTER — Other Ambulatory Visit (HOSPITAL_COMMUNITY): Payer: Self-pay

## 2021-07-31 ENCOUNTER — Inpatient Hospital Stay: Payer: Managed Care, Other (non HMO)

## 2021-07-31 DIAGNOSIS — C249 Malignant neoplasm of biliary tract, unspecified: Secondary | ICD-10-CM

## 2021-07-31 DIAGNOSIS — D61818 Other pancytopenia: Secondary | ICD-10-CM | POA: Insufficient documentation

## 2021-07-31 DIAGNOSIS — Z5112 Encounter for antineoplastic immunotherapy: Secondary | ICD-10-CM | POA: Diagnosis not present

## 2021-07-31 DIAGNOSIS — M8458XA Pathological fracture in neoplastic disease, other specified site, initial encounter for fracture: Secondary | ICD-10-CM

## 2021-07-31 DIAGNOSIS — I2699 Other pulmonary embolism without acute cor pulmonale: Secondary | ICD-10-CM

## 2021-07-31 LAB — CMP (CANCER CENTER ONLY)
ALT: 26 U/L (ref 0–44)
AST: 18 U/L (ref 15–41)
Albumin: 4 g/dL (ref 3.5–5.0)
Alkaline Phosphatase: 260 U/L — ABNORMAL HIGH (ref 38–126)
Anion gap: 7 (ref 5–15)
BUN: 10 mg/dL (ref 8–23)
CO2: 29 mmol/L (ref 22–32)
Calcium: 9.2 mg/dL (ref 8.9–10.3)
Chloride: 103 mmol/L (ref 98–111)
Creatinine: 0.83 mg/dL (ref 0.61–1.24)
GFR, Estimated: >60 mL/min (ref >60)
Glucose, Bld: 95 mg/dL (ref 70–99)
Potassium: 4.7 mmol/L (ref 3.5–5.1)
Sodium: 139 mmol/L (ref 135–145)
Total Bilirubin: 0.4 mg/dL (ref 0.3–1.2)
Total Protein: 6.9 g/dL (ref 6.5–8.1)

## 2021-07-31 LAB — CBC WITH DIFFERENTIAL (CANCER CENTER ONLY)
Abs Immature Granulocytes: 0.18 10*3/uL — ABNORMAL HIGH (ref 0.00–0.07)
Basophils Absolute: 0.1 10*3/uL (ref 0.0–0.1)
Basophils Relative: 1 %
Eosinophils Absolute: 0.1 10*3/uL (ref 0.0–0.5)
Eosinophils Relative: 0 %
HCT: 34.4 % — ABNORMAL LOW (ref 39.0–52.0)
Hemoglobin: 11.9 g/dL — ABNORMAL LOW (ref 13.0–17.0)
Immature Granulocytes: 1 %
Lymphocytes Relative: 11 %
Lymphs Abs: 1.6 10*3/uL (ref 0.7–4.0)
MCH: 32.9 pg (ref 26.0–34.0)
MCHC: 34.6 g/dL (ref 30.0–36.0)
MCV: 95 fL (ref 80.0–100.0)
Monocytes Absolute: 2 10*3/uL — ABNORMAL HIGH (ref 0.1–1.0)
Monocytes Relative: 13 %
Neutro Abs: 11.3 10*3/uL — ABNORMAL HIGH (ref 1.7–7.7)
Neutrophils Relative %: 74 %
Platelet Count: 87 10*3/uL — ABNORMAL LOW (ref 150–400)
RBC: 3.62 MIL/uL — ABNORMAL LOW (ref 4.22–5.81)
RDW: 17.7 % — ABNORMAL HIGH (ref 11.5–15.5)
WBC Count: 15.2 10*3/uL — ABNORMAL HIGH (ref 4.0–10.5)
nRBC: 0.3 % — ABNORMAL HIGH (ref 0.0–0.2)

## 2021-07-31 LAB — MAGNESIUM: Magnesium: 2.2 mg/dL (ref 1.7–2.4)

## 2021-07-31 MED ORDER — APIXABAN 5 MG PO TABS
5.0000 mg | ORAL_TABLET | Freq: Two times a day (BID) | ORAL | 6 refills | Status: DC
  Filled 2021-07-31: qty 60, 30d supply, fill #0

## 2021-07-31 MED FILL — Fosaprepitant Dimeglumine For IV Infusion 150 MG (Base Eq): INTRAVENOUS | Qty: 5 | Status: AC

## 2021-07-31 NOTE — Assessment & Plan Note (Signed)
This is due to treatment He is not symptomatic Observe for now There is no contraindication for him to continue anticoagulation therapy We will skip his treatment next week and modify his schedule as above

## 2021-07-31 NOTE — Assessment & Plan Note (Signed)
The plan will be to continue anticoagulation therapy for 1 year He has no bleeding complications

## 2021-07-31 NOTE — Assessment & Plan Note (Signed)
I have reviewed multiple imaging studies with the patient and his son His blood work and imaging studies show excellent response with therapy He is getting more pancytopenic with treatment I recommend modifying his treatment schedule to every other week With additional time off from treatment, I am confident we can omit G-CSF support I recommend 3 more months of treatment before repeating imaging study, next will be due in January 2023 He is in agreement

## 2021-07-31 NOTE — Progress Notes (Signed)
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[place]) I have reviewed multiple imaging studies with the patient and his son His blood work and imaging studies show excellent response with therapy He is getting more pancytopenic with treatment I recommend modifying his treatment schedule to every other week With additional time off from treatment, I am confident we can omit G-CSF support I recommend 3 more months of treatment before repeating imaging study, next will be due in January 2023 He is in agreement  Pancytopenia, 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   All questions were answered. The patient knows to call the clinic with any problems, questions or concerns. The total time spent in the appointment was 40 minutes encounter with patients including review of chart and various tests results, discussions about plan of care and coordination of care plan   Heath Lark, MD 07/31/2021 2:55 PM  INTERVAL HISTORY: Please see below for problem oriented charting. he returns for treatment follow-up with his son and interpreter for biliary tract cancer, on chemotherapy Since last time I saw him, he is doing well He rarely takes oxycodone, maybe on average once a twice per week Denies peripheral neuropathy from treatment No recent nausea The patient denies any recent signs or symptoms of  bleeding such as spontaneous epistaxis, hematuria or hematochezia.   REVIEW OF SYSTEMS:   Constitutional: Denies fevers, chills or abnormal weight loss Eyes: Denies blurriness of vision Ears, nose, mouth, throat, and face: Denies mucositis or sore throat Respiratory: Denies cough, dyspnea or wheezes Cardiovascular: Denies palpitation, chest discomfort or lower extremity swelling Gastrointestinal:  Denies nausea, heartburn or change in bowel habits Skin: Denies abnormal skin rashes Lymphatics: Denies new lymphadenopathy or easy bruising Neurological:Denies numbness, tingling or new weaknesses Behavioral/Psych: Mood is stable, no new changes  All other systems were reviewed with the patient and are negative.  I have reviewed the past medical history, past surgical history, social history and family history with the patient and they are unchanged from previous note.  ALLERGIES:  has No Known Allergies.  MEDICATIONS:  Current Outpatient Medications  Medication Sig Dispense Refill   apixaban (ELIQUIS) 5 MG TABS tablet Take 1 tablet (5 mg total) by mouth 2 (two) times daily. 60 tablet 6   lidocaine-prilocaine (EMLA) cream Apply to affected area once 30 g 3   ondansetron (ZOFRAN) 8 MG tablet Take 1 tablet by mouth every 8 hours as needed. 30 tablet 1   oxyCODONE (OXY IR/ROXICODONE) 5 MG immediate release tablet Take 1 tablet (5 mg total) by mouth every 4 hours as needed for severe pain. 60 tablet 0   prochlorperazine (COMPAZINE) 10 MG tablet Take 1 tablet by mouth every 6 hours as needed (Nausea or vomiting). 30 tablet 1   senna-docusate (SENOKOT-S) 8.6-50 MG tablet Take 1 tablet by mouth 2 (two) times daily as needed for mild constipation or moderate constipation.  No current facility-administered medications for this visit.    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 He underwent liver biopsy   05/14/2021 Imaging   RIGHT:  - There is no evidence of deep vein thrombosis in the lower extremity.     - No cystic structure found in the popliteal fossa.     LEFT:  - There is no evidence of deep vein thrombosis in the lower extremity.     - No cystic structure found in the popliteal fossa.    05/14/2021 Imaging   1. Pathologic split type fracture of T4 with approximately 25% height loss, likely due to metastatic disease. 2. No other evidence of metastatic disease of the cervical, thoracic or lumbar spine. 3. Moderate left L5-S1 neural foraminal stenosis. 4. Left-greater-than-right lateral recess narrowing at L4-5 and L5-S1.     05/14/2021 Imaging   1. 5 cm ill-defined irregular low-density lesion in the anterior right liver inferiorly is highly suspicious for primary or metastatic neoplasm. The lesion obliterates bile ducts in the central right liver resulting in right hepatic lobe biliary dilatation. Similarly, portal venous anatomy in the anterior right liver is involved along the superomedial margin of the lesion. Lesion should be amenable to tissue sampling. 2. Gallbladder wall is contracted and irregular in appearance adjacent to the lesion. Imaging features suggest that gallbladder changes are secondary although primary gallbladder neoplasm not excluded. 3. 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 No evidence for additional metastatic disease in the abdomen or pelvis. 4. Prostatomegaly. 5. Trace free fluid in the pelvis. 6. Aortic Atherosclerosis (ICD10-I70.0).   05/15/2021 Pathology Results   Clinical History: Right hepatic tumor and evidence for bone metastasis ([place])    FINAL MICROSCOPIC DIAGNOSIS:   A. LIVER, RIGHT, BIOPSY:  -  Adenocarcinoma  -  See comment   COMMENT:   By immunohistochemistry, the neoplastic cells are positive for cytokeratin 7, monoclonal and polyclonal CEA with  patchy positivity for cytokeratin 20.  The tumor cells are negative for TTF-1, AFP, prostein, PSA and CDX2.  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     05/15/2021 Procedure   Ultrasound-guided core biopsies of a right hepatic lesion.   05/16/2021 Imaging   MRI brain  Unremarkable appearance of the brain for age. No evidence of intracranial metastases.     05/16/2021 Imaging   Ct chest  1. Study is once again positive for subsegmental sized emboli in the right lower lobe. No larger central, lobar or segmental sized emboli are noted. 2. Lytic lesion in T4 with pathologic fracture, similar to the prior study. This was better depicted on recent thoracic spine MRI. 3. Aortic atherosclerosis, in addition to left main and 3 vessel coronary artery disease. There is also mild ectasia of the ascending thoracic aorta (4.0 cm in diameter).    05/19/2021 Procedure   Ultrasound and fluoroscopically guided right internal jugular single lumen power port catheter insertion. Tip in the SVC/RA junction. Catheter ready for use.   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 Results of the tumor marker test revealed 1627.   07/17/2021 Tumor Marker   Patient's tumor was tested for the following markers: CA19-9. Results of the tumor marker test revealed 910.   07/30/2021 Imaging   Decreased size of mass lesion with biliary duct dilation the capsular  retraction with imaging features of cholangiocarcinoma.   Soft tissue along the capsular margin also with decreased conspicuity.   T4 metastatic lesion with increasing sclerosis, potentially reflecting some healing in the interval of this pathologic fracture and associated metastatic disease.   Aortic atherosclerosis.       PHYSICAL EXAMINATION: ECOG PERFORMANCE STATUS: 1 - Symptomatic but completely ambulatory  Vitals:   07/31/21 0904  BP: 130/73  Pulse: 77  Resp: 18  Temp: 97.9 F (36.6 C)  SpO2: 100%   Filed Weights   07/31/21 0904  Weight: 142 lb 11.2 oz (64.7 kg)    GENERAL:alert, no distress and comfortable SKIN: skin color, texture, turgor are normal, no rashes or significant lesions EYES: normal, Conjunctiva are pink and non-injected, sclera clear OROPHARYNX:no exudate, no erythema and lips, buccal mucosa, and tongue normal  NECK: supple, thyroid normal size, non-tender, without nodularity LYMPH:  no palpable lymphadenopathy in the cervical, axillary or inguinal LUNGS: clear to auscultation and percussion with normal breathing effort HEART: regular rate & rhythm and no murmurs and no lower extremity edema ABDOMEN:abdomen soft, non-tender and normal bowel sounds Musculoskeletal:no cyanosis of digits and no clubbing  NEURO: alert & oriented x 3 with fluent speech, no focal motor/sensory deficits  LABORATORY DATA:  I have reviewed the data as listed    Component Value Date/Time   NA 139 07/31/2021 0844   NA 145 (H) 04/28/2018 1054   K 4.7 07/31/2021 0844   CL 103 07/31/2021 0844   CO2 29 07/31/2021 0844   GLUCOSE 95 07/31/2021 0844   BUN 10 07/31/2021 0844   BUN 13 04/28/2018 1054   CREATININE 0.83 07/31/2021 0844   CALCIUM 9.2 07/31/2021 0844   PROT 6.9 07/31/2021 0844   PROT 6.8 04/28/2018 1054   ALBUMIN 4.0 07/31/2021 0844   ALBUMIN 4.4 04/28/2018 1054   AST 18 07/31/2021 0844   ALT 26 07/31/2021 0844   ALKPHOS 260 (H) 07/31/2021 0844   BILITOT 0.4  07/31/2021 0844   GFRNONAA >60 07/31/2021 0844   GFRAA >60 08/22/2018 0904    No results found for: SPEP, UPEP  Lab Results  Component Value Date   WBC 15.2 (H) 07/31/2021   NEUTROABS 11.3 (H) 07/31/2021   HGB 11.9 (L) 07/31/2021   HCT 34.4 (L) 07/31/2021   MCV 95.0 07/31/2021   PLT 87 (L) 07/31/2021      Chemistry      Component Value Date/Time   NA 139 07/31/2021 0844   NA 145 (H) 04/28/2018 1054   K 4.7 07/31/2021 0844   CL 103 07/31/2021 0844   CO2 29 07/31/2021 0844   BUN 10 07/31/2021 0844   BUN 13 04/28/2018 1054   CREATININE 0.83 07/31/2021 0844      Component Value Date/Time   CALCIUM 9.2 07/31/2021 0844   ALKPHOS 260 (H) 07/31/2021 0844   AST 18 07/31/2021 0844   ALT 26 07/31/2021 0844   BILITOT 0.4 07/31/2021 0844       RADIOGRAPHIC  STUDIES: I have reviewed multiple imaging studies with the patient and his son I have personally reviewed the radiological images as listed and agreed with the findings in the report. CT CHEST ABDOMEN PELVIS W CONTRAST  Result Date: 07/30/2021 CLINICAL DATA:  A 65 year old male presents with follow-up for hepatic neoplasm. EXAM: CT CHEST, ABDOMEN, AND PELVIS WITH CONTRAST TECHNIQUE: Multidetector CT imaging of the chest, abdomen and pelvis was performed following the standard protocol during bolus administration of intravenous contrast. CONTRAST:  40mL OMNIPAQUE IOHEXOL 350 MG/ML SOLN COMPARISON:  Multiple prior studies most recent imaging from 06-05-21. FINDINGS: CT CHEST FINDINGS Cardiovascular: RIGHT-sided Port-A-Cath terminates at the caval to atrial junction. Heart size is normal without visible pericardial effusion. Central pulmonary vasculature unremarkable on venous phase assessment. Calcified and noncalcified atheromatous plaque throughout the thoracic aorta. No signs of aneurysmal dilation. Mediastinum/Nodes: No thoracic inlet, axillary, mediastinal or hilar adenopathy. Esophagus grossly normal. Lungs/Pleura: Lungs  are clear.  Airways are patent. Musculoskeletal: See below for full musculoskeletal detail. CT ABDOMEN PELVIS FINDINGS Hepatobiliary: Segmental biliary duct distension extending into the cephalad RIGHT hemi liver and mass lesion in the RIGHT hemi liver. Mass lesion centered in hepatic subsegment V/VI shows capsular retraction and measures approximately 4.7 x 3.5 cm as compared to 4.9 x 3.5 cm. No new hepatic lesions are visualized. No pericholecystic stranding or sign of biliary duct distension. Portal vein remains patent into uninvolved areas of the liver and attenuated in the inferior RIGHT hemi liver. Pancreas: Normal, without mass, inflammation or ductal dilatation. Spleen: Spleen normal size and contour. Adrenals/Urinary Tract: Adrenal glands are normal. Symmetric renal enhancement without hydronephrosis. Urinary bladder is under distended but without adjacent stranding or gross thickening. Stomach/Bowel: No acute gastrointestinal findings. The normal appendix. Vascular/Lymphatic: Aortic atherosclerosis. No sign of aneurysm. Smooth contour of the IVC. There is no gastrohepatic or hepatoduodenal ligament lymphadenopathy. No retroperitoneal or mesenteric lymphadenopathy. No pelvic sidewall lymphadenopathy. Reproductive: Prostatomegaly as before, otherwise unremarkable. Other: No ascites. Soft tissue density along the liver capsule (image 99/7) this is adjacent to the mass lesion and measures 6 mm greatest thickness, subjective decrease overall in amount of pericapsular irregularity. Musculoskeletal: Compression fracture with irregular sclerotic changes and mixed density compatible with pathologic fracture at T4 showing increased sclerosis since previous imaging and no substantial change in loss of height at this level. No new destructive bone process. IMPRESSION: Decreased size of mass lesion with biliary duct dilation the capsular retraction with imaging features of cholangiocarcinoma. Soft tissue along the  capsular margin also with decreased conspicuity. T4 metastatic lesion with increasing sclerosis, potentially reflecting some healing in the interval of this pathologic fracture and associated metastatic disease. Aortic atherosclerosis. Electronically Signed   By: Zetta Bills M.D.   On: 07/30/2021 13:24

## 2021-07-31 NOTE — Assessment & Plan Note (Signed)
He denies recent exacerbation of back pain He will continue oxycodone as needed

## 2021-08-03 ENCOUNTER — Ambulatory Visit: Payer: Managed Care, Other (non HMO)

## 2021-08-07 ENCOUNTER — Inpatient Hospital Stay: Payer: Managed Care, Other (non HMO)

## 2021-08-07 ENCOUNTER — Telehealth: Payer: Self-pay

## 2021-08-07 ENCOUNTER — Other Ambulatory Visit: Payer: Self-pay

## 2021-08-07 DIAGNOSIS — C249 Malignant neoplasm of biliary tract, unspecified: Secondary | ICD-10-CM

## 2021-08-07 DIAGNOSIS — Z5112 Encounter for antineoplastic immunotherapy: Secondary | ICD-10-CM | POA: Diagnosis not present

## 2021-08-07 LAB — CBC WITH DIFFERENTIAL (CANCER CENTER ONLY)
Abs Immature Granulocytes: 0.05 10*3/uL (ref 0.00–0.07)
Basophils Absolute: 0 10*3/uL (ref 0.0–0.1)
Basophils Relative: 0 %
Eosinophils Absolute: 0.1 10*3/uL (ref 0.0–0.5)
Eosinophils Relative: 2 %
HCT: 37.6 % — ABNORMAL LOW (ref 39.0–52.0)
Hemoglobin: 12.6 g/dL — ABNORMAL LOW (ref 13.0–17.0)
Immature Granulocytes: 1 %
Lymphocytes Relative: 16 %
Lymphs Abs: 1.5 10*3/uL (ref 0.7–4.0)
MCH: 32.1 pg (ref 26.0–34.0)
MCHC: 33.5 g/dL (ref 30.0–36.0)
MCV: 95.7 fL (ref 80.0–100.0)
Monocytes Absolute: 1.1 10*3/uL — ABNORMAL HIGH (ref 0.1–1.0)
Monocytes Relative: 13 %
Neutro Abs: 6.1 10*3/uL (ref 1.7–7.7)
Neutrophils Relative %: 68 %
Platelet Count: 356 10*3/uL (ref 150–400)
RBC: 3.93 MIL/uL — ABNORMAL LOW (ref 4.22–5.81)
RDW: 18.5 % — ABNORMAL HIGH (ref 11.5–15.5)
WBC Count: 8.9 10*3/uL (ref 4.0–10.5)
nRBC: 0.2 % (ref 0.0–0.2)

## 2021-08-07 LAB — CMP (CANCER CENTER ONLY)
ALT: 19 U/L (ref 0–44)
AST: 16 U/L (ref 15–41)
Albumin: 3.7 g/dL (ref 3.5–5.0)
Alkaline Phosphatase: 217 U/L — ABNORMAL HIGH (ref 38–126)
Anion gap: 8 (ref 5–15)
BUN: 13 mg/dL (ref 8–23)
CO2: 27 mmol/L (ref 22–32)
Calcium: 9.6 mg/dL (ref 8.9–10.3)
Chloride: 106 mmol/L (ref 98–111)
Creatinine: 0.82 mg/dL (ref 0.61–1.24)
GFR, Estimated: >60 mL/min (ref >60)
Glucose, Bld: 90 mg/dL (ref 70–99)
Potassium: 4.5 mmol/L (ref 3.5–5.1)
Sodium: 141 mmol/L (ref 135–145)
Total Bilirubin: 0.3 mg/dL (ref 0.3–1.2)
Total Protein: 7.2 g/dL (ref 6.5–8.1)

## 2021-08-07 LAB — MAGNESIUM: Magnesium: 2.3 mg/dL (ref 1.7–2.4)

## 2021-08-07 MED FILL — Dexamethasone Sodium Phosphate Inj 100 MG/10ML: INTRAMUSCULAR | Qty: 1 | Status: AC

## 2021-08-07 MED FILL — Fosaprepitant Dimeglumine For IV Infusion 150 MG (Base Eq): INTRAVENOUS | Qty: 5 | Status: AC

## 2021-08-07 NOTE — Telephone Encounter (Signed)
-----   Message from Heath Lark, MD sent at 08/07/2021  9:34 AM EDT ----- Pls call his son, labs are good today, resume treatment on Monday as scheduled

## 2021-08-07 NOTE — Telephone Encounter (Signed)
Called and given below message. Son verbalized understanding and will share with his Dad.

## 2021-08-10 ENCOUNTER — Other Ambulatory Visit: Payer: Self-pay

## 2021-08-10 ENCOUNTER — Inpatient Hospital Stay: Payer: Managed Care, Other (non HMO)

## 2021-08-10 VITALS — BP 140/82 | HR 73 | Temp 98.0°F | Resp 16 | Wt 146.0 lb

## 2021-08-10 DIAGNOSIS — C249 Malignant neoplasm of biliary tract, unspecified: Secondary | ICD-10-CM

## 2021-08-10 DIAGNOSIS — Z5112 Encounter for antineoplastic immunotherapy: Secondary | ICD-10-CM | POA: Diagnosis not present

## 2021-08-10 MED ORDER — PALONOSETRON HCL INJECTION 0.25 MG/5ML
0.2500 mg | Freq: Once | INTRAVENOUS | Status: AC
  Administered 2021-08-10: 0.25 mg via INTRAVENOUS
  Filled 2021-08-10: qty 5

## 2021-08-10 MED ORDER — SODIUM CHLORIDE 0.9 % IV SOLN: Freq: Once | INTRAVENOUS | Status: AC

## 2021-08-10 MED ORDER — SODIUM CHLORIDE 0.9 % IV SOLN
1000.0000 mg/m2 | Freq: Once | INTRAVENOUS | Status: AC
  Administered 2021-08-10: 1672 mg via INTRAVENOUS
  Filled 2021-08-10: qty 43.97

## 2021-08-10 MED ORDER — MAGNESIUM SULFATE 2 GM/50ML IV SOLN
2.0000 g | Freq: Once | INTRAVENOUS | Status: AC
  Administered 2021-08-10: 2 g via INTRAVENOUS
  Filled 2021-08-10: qty 50

## 2021-08-10 MED ORDER — SODIUM CHLORIDE 0.9 % IV SOLN
10.0000 mg | Freq: Once | INTRAVENOUS | Status: AC
  Administered 2021-08-10: 10 mg via INTRAVENOUS
  Filled 2021-08-10: qty 10
  Filled 2021-08-10: qty 1

## 2021-08-10 MED ORDER — HEPARIN SOD (PORK) LOCK FLUSH 100 UNIT/ML IV SOLN
500.0000 [IU] | Freq: Once | INTRAVENOUS | Status: AC | PRN
  Administered 2021-08-10: 500 [IU]

## 2021-08-10 MED ORDER — DURVALUMAB 500 MG/10ML IV SOLN
1500.0000 mg | Freq: Once | INTRAVENOUS | Status: AC
  Administered 2021-08-10: 1500 mg via INTRAVENOUS
  Filled 2021-08-10: qty 30

## 2021-08-10 MED ORDER — FAMOTIDINE 20 MG IN NS 100 ML IVPB
20.0000 mg | Freq: Once | INTRAVENOUS | Status: AC
  Administered 2021-08-10: 20 mg via INTRAVENOUS
  Filled 2021-08-10: qty 100

## 2021-08-10 MED ORDER — SODIUM CHLORIDE 0.9% FLUSH
10.0000 mL | INTRAVENOUS | Status: DC | PRN
  Administered 2021-08-10: 10 mL

## 2021-08-10 MED ORDER — SODIUM CHLORIDE 0.9 % IV SOLN
25.0000 mg/m2 | Freq: Once | INTRAVENOUS | Status: AC
  Administered 2021-08-10: 42 mg via INTRAVENOUS
  Filled 2021-08-10: qty 42

## 2021-08-10 MED ORDER — SODIUM CHLORIDE 0.9 % IV SOLN
150.0000 mg | Freq: Once | INTRAVENOUS | Status: AC
  Administered 2021-08-10: 150 mg via INTRAVENOUS
  Filled 2021-08-10: qty 150
  Filled 2021-08-10: qty 5

## 2021-08-10 MED ORDER — POTASSIUM CHLORIDE IN NACL 20-0.9 MEQ/L-% IV SOLN
Freq: Once | INTRAVENOUS | Status: AC
Start: 2021-08-10 — End: 2021-08-10
  Filled 2021-08-10: qty 1000

## 2021-08-10 NOTE — Patient Instructions (Signed)
[facility] ONCOLOG[facility]  Discharge Instructions: Thank you for choosing [facility] to provide your oncology and hematology care.   If you have a lab appointment with the [facility], please go directly to the [facility] and check in at the registration area.   Wear comfortable clothing and clothing appropriate for easy access to any Portacath or PICC line.   We strive to give you quality time with your provider. You may need to reschedule your appointment if you arrive late (15 or more minutes).  Arriving late affects you and other patients whose appointments are after yours.  Also, if you miss three or more appointments without notifying the office, you may be dismissed from the clinic at the provider's discretion.      For prescription refill requests, have your pharmacy contact our office and allow 72 hours for refills to be completed.    Today you received the following chemotherapy and/or immunotherapy agents :  Imfinzi, Gemcitabine, Cisplatin.       To help prevent nausea and vomiting after your treatment, we encourage you to take your nausea medication as directed.  BELOW ARE SYMPTOMS THAT SHOULD BE REPORTED IMMEDIATELY: *FEVER GREATER THAN 100.4 F (38 C) OR HIGHER *CHILLS OR SWEATING *NAUSEA AND VOMITING THAT IS NOT CONTROLLED WITH YOUR NAUSEA MEDICATION *UNUSUAL SHORTNESS OF BREATH *UNUSUAL BRUISING OR BLEEDING *URINARY PROBLEMS (pain or burning when urinating, or frequent urination) *BOWEL PROBLEMS (unusual diarrhea, constipation, pain near the anus) TENDERNESS IN MOUTH AND THROAT WITH OR WITHOUT PRESENCE OF ULCERS (sore throat, sores in mouth, or a toothache) UNUSUAL RASH, SWELLING OR PAIN  UNUSUAL VAGINAL DISCHARGE OR ITCHING   Items with * indicate a potential emergency and should be followed up as soon as possible or go to the Emergency Department if any problems should occur.  Please show the CHEMOTHERAPY ALERT CARD or IMMUNOTHERAPY  ALERT CARD at check-in to the Emergency Department and triage nurse.  Should you have questions after your visit or need to cancel or reschedule your appointment, please contact [facility]  Dept: [phone]  and follow the prompts.  Office hours are 8:00 a.m. to 4:30 p.m. Monday - Friday. Please note that voicemails left after 4:00 p.m. may not be returned until the following business day.  We are closed weekends and major holidays. You have access to a nurse at all times for urgent questions. Please call the main number to the clinic Dept: [phone] and follow the prompts.   For any non-urgent questions, you may also contact your provider using MyChart. We now offer e-Visits for anyone 66 and older to request care online for non-urgent symptoms. For details visit [email or web address].   Also download the MyChart app! Go to the app store, search "MyChart", open the app, select [facility], and log in with your MyChart username and password.  Due to Covid, a mask is required upon entering the hospital/clinic. If you do not have a mask, one will be given to you upon arrival. For doctor visits, patients may have 1 support person aged 55 or older with them. For treatment visits, patients cannot have anyone with them due to current Covid guidelines and our immunocompromised population.

## 2021-08-11 ENCOUNTER — Ambulatory Visit: Payer: Managed Care, Other (non HMO)

## 2021-08-14 ENCOUNTER — Telehealth: Payer: Self-pay

## 2021-08-14 NOTE — Telephone Encounter (Signed)
Returned sons call. Reviewed upcoming appts, Son verbalized understanding.

## 2021-08-21 ENCOUNTER — Inpatient Hospital Stay: Payer: Managed Care, Other (non HMO) | Attending: Radiation Oncology

## 2021-08-21 ENCOUNTER — Other Ambulatory Visit: Payer: Self-pay

## 2021-08-21 ENCOUNTER — Inpatient Hospital Stay (HOSPITAL_BASED_OUTPATIENT_CLINIC_OR_DEPARTMENT_OTHER): Payer: Managed Care, Other (non HMO) | Admitting: Hematology and Oncology

## 2021-08-21 ENCOUNTER — Encounter: Payer: Self-pay | Admitting: Hematology and Oncology

## 2021-08-21 DIAGNOSIS — I2699 Other pulmonary embolism without acute cor pulmonale: Secondary | ICD-10-CM

## 2021-08-21 DIAGNOSIS — C221 Intrahepatic bile duct carcinoma: Secondary | ICD-10-CM | POA: Diagnosis present

## 2021-08-21 DIAGNOSIS — Z79899 Other long term (current) drug therapy: Secondary | ICD-10-CM | POA: Diagnosis not present

## 2021-08-21 DIAGNOSIS — C249 Malignant neoplasm of biliary tract, unspecified: Secondary | ICD-10-CM

## 2021-08-21 DIAGNOSIS — Z86711 Personal history of pulmonary embolism: Secondary | ICD-10-CM | POA: Insufficient documentation

## 2021-08-21 DIAGNOSIS — D61818 Other pancytopenia: Secondary | ICD-10-CM | POA: Insufficient documentation

## 2021-08-21 DIAGNOSIS — Z7901 Long term (current) use of anticoagulants: Secondary | ICD-10-CM | POA: Insufficient documentation

## 2021-08-21 DIAGNOSIS — M8458XA Pathological fracture in neoplastic disease, other specified site, initial encounter for fracture: Secondary | ICD-10-CM

## 2021-08-21 DIAGNOSIS — Z5111 Encounter for antineoplastic chemotherapy: Secondary | ICD-10-CM | POA: Insufficient documentation

## 2021-08-21 DIAGNOSIS — C7951 Secondary malignant neoplasm of bone: Secondary | ICD-10-CM

## 2021-08-21 DIAGNOSIS — K5909 Other constipation: Secondary | ICD-10-CM

## 2021-08-21 LAB — CBC WITH DIFFERENTIAL (CANCER CENTER ONLY)
Abs Immature Granulocytes: 0.01 10*3/uL (ref 0.00–0.07)
Basophils Absolute: 0 10*3/uL (ref 0.0–0.1)
Basophils Relative: 0 %
Eosinophils Absolute: 0 10*3/uL (ref 0.0–0.5)
Eosinophils Relative: 1 %
HCT: 32.9 % — ABNORMAL LOW (ref 39.0–52.0)
Hemoglobin: 11.3 g/dL — ABNORMAL LOW (ref 13.0–17.0)
Immature Granulocytes: 0 %
Lymphocytes Relative: 21 %
Lymphs Abs: 1.1 10*3/uL (ref 0.7–4.0)
MCH: 32.9 pg (ref 26.0–34.0)
MCHC: 34.3 g/dL (ref 30.0–36.0)
MCV: 95.9 fL (ref 80.0–100.0)
Monocytes Absolute: 0.7 10*3/uL (ref 0.1–1.0)
Monocytes Relative: 13 %
Neutro Abs: 3.4 10*3/uL (ref 1.7–7.7)
Neutrophils Relative %: 65 %
Platelet Count: 97 10*3/uL — ABNORMAL LOW (ref 150–400)
RBC: 3.43 MIL/uL — ABNORMAL LOW (ref 4.22–5.81)
RDW: 18.4 % — ABNORMAL HIGH (ref 11.5–15.5)
WBC Count: 5.2 10*3/uL (ref 4.0–10.5)
nRBC: 0 % (ref 0.0–0.2)

## 2021-08-21 LAB — CMP (CANCER CENTER ONLY)
ALT: 18 U/L (ref 0–44)
AST: 14 U/L — ABNORMAL LOW (ref 15–41)
Albumin: 3.8 g/dL (ref 3.5–5.0)
Alkaline Phosphatase: 145 U/L — ABNORMAL HIGH (ref 38–126)
Anion gap: 8 (ref 5–15)
BUN: 12 mg/dL (ref 8–23)
CO2: 26 mmol/L (ref 22–32)
Calcium: 8.7 mg/dL — ABNORMAL LOW (ref 8.9–10.3)
Chloride: 106 mmol/L (ref 98–111)
Creatinine: 0.71 mg/dL (ref 0.61–1.24)
GFR, Estimated: >60 mL/min (ref >60)
Glucose, Bld: 94 mg/dL (ref 70–99)
Potassium: 4 mmol/L (ref 3.5–5.1)
Sodium: 140 mmol/L (ref 135–145)
Total Bilirubin: 0.4 mg/dL (ref 0.3–1.2)
Total Protein: 6.7 g/dL (ref 6.5–8.1)

## 2021-08-21 LAB — T4, FREE: Free T4: 0.88 ng/dL (ref 0.61–1.12)

## 2021-08-21 LAB — MAGNESIUM: Magnesium: 2.1 mg/dL (ref 1.7–2.4)

## 2021-08-21 LAB — TSH: TSH: 1.01 u[IU]/mL (ref 0.320–4.118)

## 2021-08-21 MED FILL — Dexamethasone Sodium Phosphate Inj 100 MG/10ML: INTRAMUSCULAR | Qty: 1 | Status: AC

## 2021-08-21 MED FILL — Fosaprepitant Dimeglumine For IV Infusion 150 MG (Base Eq): INTRAVENOUS | Qty: 5 | Status: AC

## 2021-08-21 NOTE — Assessment & Plan Note (Signed)
His blood work and imaging studies showed excellent response with therapy Per previous discussion, due to pancytopenia, with spacing out his treatment to every other week He is still mildly pancytopenic today but we will proceed with treatment as scheduled I recommend 3 more months of treatment before repeating imaging study, next will be due in January 2023 He is in agreement

## 2021-08-21 NOTE — Progress Notes (Signed)
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   All questions were answered. The patient knows to call the clinic with any problems, questions or concerns. The total time spent in the appointment was 20 minutes encounter with patients including review of chart and various tests results, discussions about plan of care and coordination of care plan   Heath Lark, MD 08/21/2021 5:08 PM  INTERVAL HISTORY: Please see below for problem oriented charting. he returns for treatment follow-up with his son Interpreter is present He is doing well His chronic back pain is stable and he only takes pain medicine once or twice a week No bleeding complications from anticoagulation therapy Denies nausea or peripheral neuropathy  REVIEW OF SYSTEMS:   Constitutional: Denies fevers, chills or abnormal weight loss Eyes: Denies blurriness of vision Ears, nose, mouth, throat, and face: Denies mucositis or sore throat Respiratory: Denies cough, dyspnea or wheezes Cardiovascular: Denies palpitation, chest discomfort or lower extremity swelling Gastrointestinal:   Denies nausea, heartburn or change in bowel habits Skin: Denies abnormal skin rashes Lymphatics: Denies new lymphadenopathy or easy bruising Neurological:Denies numbness, tingling or new weaknesses Behavioral/Psych: Mood is stable, no new changes  All other systems were reviewed with the patient and are negative.  I have reviewed the past medical history, past surgical history, social history and family history with the patient and they are unchanged from previous note.  ALLERGIES:  has No Known Allergies.  MEDICATIONS:  Current Outpatient Medications  Medication Sig Dispense Refill   apixaban (ELIQUIS) 5 MG TABS tablet Take 1 tablet (5 mg total) by mouth 2 (two) times daily. 60 tablet 6   lidocaine-prilocaine (EMLA) cream Apply to affected area once 30 g 3   ondansetron (ZOFRAN) 8 MG tablet Take 1 tablet by mouth every 8 hours as needed. 30 tablet 1   oxyCODONE (OXY IR/ROXICODONE) 5 MG immediate release tablet Take 1 tablet (5 mg total) by mouth every 4 hours as needed for severe pain. 60 tablet 0   prochlorperazine (COMPAZINE) 10 MG tablet Take 1 tablet by mouth every 6 hours as needed (Nausea or vomiting). 30 tablet 1   senna-docusate (SENOKOT-S) 8.6-50 MG tablet Take 1 tablet by mouth 2 (two) times daily as needed for mild constipation or moderate constipation.     No current facility-administered medications for this visit.    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 He underwent liver biopsy   05/14/2021 Imaging   RIGHT:  - There is no evidence of deep vein thrombosis in  the lower extremity.     - No cystic structure found in the popliteal fossa.     LEFT:  - There is no evidence of deep vein thrombosis in the lower extremity.     - No cystic structure found in the popliteal fossa.    05/14/2021 Imaging   1.  Pathologic split type fracture of T4 with approximately 25% height loss, likely due to metastatic disease. 2. No other evidence of metastatic disease of the cervical, thoracic or lumbar spine. 3. Moderate left L5-S1 neural foraminal stenosis. 4. Left-greater-than-right lateral recess narrowing at L4-5 and L5-S1.     05/14/2021 Imaging   1. 5 cm ill-defined irregular low-density lesion in the anterior right liver inferiorly is highly suspicious for primary or metastatic neoplasm. The lesion obliterates bile ducts in the central right liver resulting in right hepatic lobe biliary dilatation. Similarly, portal venous anatomy in the anterior right liver is involved along the superomedial margin of the lesion. Lesion should be amenable to tissue sampling. 2. Gallbladder wall is contracted and irregular in appearance adjacent to the lesion. Imaging features suggest that gallbladder changes are secondary although primary gallbladder neoplasm not excluded. 3. 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 No evidence for additional metastatic disease in the abdomen or pelvis. 4. Prostatomegaly. 5. Trace free fluid in the pelvis. 6. Aortic Atherosclerosis (ICD10-I70.0).   05/15/2021 Pathology Results   Clinical History: Right hepatic tumor and evidence for bone metastasis ([facility])    FINAL MICROSCOPIC DIAGNOSIS:   A. LIVER, RIGHT, BIOPSY:  -  Adenocarcinoma  -  See comment   COMMENT:   By immunohistochemistry, the neoplastic cells are positive for cytokeratin 7, monoclonal and polyclonal CEA with patchy positivity for cytokeratin 20.  The tumor cells are negative for TTF-1, AFP, prostein, PSA and CDX2.  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     05/15/2021 Procedure   Ultrasound-guided core biopsies of a right hepatic lesion.   05/16/2021 Imaging   MRI  brain  Unremarkable appearance of the brain for age. No evidence of intracranial metastases.     05/16/2021 Imaging   Ct chest  1. Study is once again positive for subsegmental sized emboli in the right lower lobe. No larger central, lobar or segmental sized emboli are noted. 2. Lytic lesion in T4 with pathologic fracture, similar to the prior study. This was better depicted on recent thoracic spine MRI. 3. Aortic atherosclerosis, in addition to left main and 3 vessel coronary artery disease. There is also mild ectasia of the ascending thoracic aorta (4.0 cm in diameter).    05/19/2021 Procedure   Ultrasound and fluoroscopically guided right internal jugular single lumen power port catheter insertion. Tip in the SVC/RA junction. Catheter ready for use.   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 Results of the tumor marker test revealed 1627.   07/17/2021 Tumor Marker   Patient's tumor was tested for the following markers: CA19-9. Results of the  tumor marker test revealed 910.   07/30/2021 Imaging   Decreased size of mass lesion with biliary duct dilation the capsular retraction with imaging features of cholangiocarcinoma.   Soft tissue along the capsular margin also with decreased conspicuity.   T4 metastatic lesion with increasing sclerosis, potentially reflecting some healing in the interval of this pathologic fracture and associated metastatic disease.   Aortic atherosclerosis.       PHYSICAL EXAMINATION: ECOG PERFORMANCE STATUS: 1 -  Symptomatic but completely ambulatory  Vitals:   08/21/21 1132  BP: 128/68  Pulse: 66  Resp: 18  Temp: 98 F (36.7 C)  SpO2: 100%   Filed Weights   08/21/21 1132  Weight: 147 lb (66.7 kg)    GENERAL:alert, no distress and comfortable SKIN: skin color, texture, turgor are normal, no rashes or significant lesions EYES: normal, Conjunctiva are pink and non-injected, sclera clear OROPHARYNX:no exudate, no erythema and lips, buccal mucosa, and tongue normal  NECK: supple, thyroid normal size, non-tender, without nodularity LYMPH:  no palpable lymphadenopathy in the cervical, axillary or inguinal LUNGS: clear to auscultation and percussion with normal breathing effort HEART: regular rate & rhythm and no murmurs and no lower extremity edema ABDOMEN:abdomen soft, non-tender and normal bowel sounds Musculoskeletal:no cyanosis of digits and no clubbing  NEURO: alert & oriented x 3 with fluent speech, no focal motor/sensory deficits  LABORATORY DATA:  I have reviewed the data as listed    Component Value Date/Time   NA 140 08/21/2021 1109   NA 145 (H) 04/28/2018 1054   K 4.0 08/21/2021 1109   CL 106 08/21/2021 1109   CO2 26 08/21/2021 1109   GLUCOSE 94 08/21/2021 1109   BUN 12 08/21/2021 1109   BUN 13 04/28/2018 1054   CREATININE 0.71 08/21/2021 1109   CALCIUM 8.7 (L) 08/21/2021 1109   PROT 6.7 08/21/2021 1109   PROT 6.8 04/28/2018 1054   ALBUMIN 3.8 08/21/2021 1109   ALBUMIN 4.4 04/28/2018 1054   AST 14 (L) 08/21/2021 1109   ALT 18 08/21/2021 1109   ALKPHOS 145 (H) 08/21/2021 1109   BILITOT 0.4 08/21/2021 1109   GFRNONAA >60 08/21/2021 1109   GFRAA >60 08/22/2018 0904    No results found for: SPEP, UPEP  Lab Results  Component Value Date   WBC 5.2 08/21/2021   NEUTROABS 3.4 08/21/2021   HGB 11.3 (L) 08/21/2021   HCT 32.9 (L) 08/21/2021   MCV 95.9 08/21/2021   PLT 97 (L) 08/21/2021      Chemistry      Component Value Date/Time   NA 140 08/21/2021 1109   NA  145 (H) 04/28/2018 1054   K 4.0 08/21/2021 1109   CL 106 08/21/2021 1109   CO2 26 08/21/2021 1109   BUN 12 08/21/2021 1109   BUN 13 04/28/2018 1054   CREATININE 0.71 08/21/2021 1109      Component Value Date/Time   CALCIUM 8.7 (L) 08/21/2021 1109   ALKPHOS 145 (H) 08/21/2021 1109   AST 14 (L) 08/21/2021 1109   ALT 18 08/21/2021 1109   BILITOT 0.4 08/21/2021 1109

## 2021-08-21 NOTE — Assessment & Plan Note (Signed)
The plan will be to continue anticoagulation therapy for 1 year He has no bleeding complications

## 2021-08-21 NOTE — Assessment & Plan Note (Signed)
He tolerated treatment fairly well except for mild pancytopenia He does not need transfusion support We will proceed with treatment as scheduled next week

## 2021-08-22 LAB — CANCER ANTIGEN 19-9: CA 19-9: 535 U/mL — ABNORMAL HIGH (ref 0–35)

## 2021-08-24 ENCOUNTER — Other Ambulatory Visit: Payer: Self-pay

## 2021-08-24 ENCOUNTER — Inpatient Hospital Stay: Payer: Managed Care, Other (non HMO)

## 2021-08-24 VITALS — BP 141/89 | HR 62 | Temp 97.8°F | Resp 16

## 2021-08-24 DIAGNOSIS — Z5111 Encounter for antineoplastic chemotherapy: Secondary | ICD-10-CM | POA: Diagnosis not present

## 2021-08-24 DIAGNOSIS — C249 Malignant neoplasm of biliary tract, unspecified: Secondary | ICD-10-CM

## 2021-08-24 MED ORDER — HEPARIN SOD (PORK) LOCK FLUSH 100 UNIT/ML IV SOLN
500.0000 [IU] | Freq: Once | INTRAVENOUS | Status: AC | PRN
  Administered 2021-08-24: 500 [IU]

## 2021-08-24 MED ORDER — POTASSIUM CHLORIDE IN NACL 20-0.9 MEQ/L-% IV SOLN
Freq: Once | INTRAVENOUS | Status: AC
  Filled 2021-08-24: qty 1000

## 2021-08-24 MED ORDER — PALONOSETRON HCL INJECTION 0.25 MG/5ML
0.2500 mg | Freq: Once | INTRAVENOUS | Status: AC
  Administered 2021-08-24: 0.25 mg via INTRAVENOUS
  Filled 2021-08-24: qty 5

## 2021-08-24 MED ORDER — SODIUM CHLORIDE 0.9 % IV SOLN
150.0000 mg | Freq: Once | INTRAVENOUS | Status: AC
  Administered 2021-08-24: 150 mg via INTRAVENOUS
  Filled 2021-08-24: qty 150

## 2021-08-24 MED ORDER — SODIUM CHLORIDE 0.9 % IV SOLN
1000.0000 mg/m2 | Freq: Once | INTRAVENOUS | Status: AC
  Administered 2021-08-24: 1710 mg via INTRAVENOUS
  Filled 2021-08-24: qty 44.97

## 2021-08-24 MED ORDER — SODIUM CHLORIDE 0.9 % IV SOLN
10.0000 mg | Freq: Once | INTRAVENOUS | Status: AC
  Administered 2021-08-24: 10 mg via INTRAVENOUS
  Filled 2021-08-24: qty 10

## 2021-08-24 MED ORDER — MAGNESIUM SULFATE 2 GM/50ML IV SOLN
2.0000 g | Freq: Once | INTRAVENOUS | Status: AC
  Administered 2021-08-24: 2 g via INTRAVENOUS
  Filled 2021-08-24: qty 50

## 2021-08-24 MED ORDER — FAMOTIDINE 20 MG IN NS 100 ML IVPB
20.0000 mg | Freq: Once | INTRAVENOUS | Status: AC
  Administered 2021-08-24: 20 mg via INTRAVENOUS
  Filled 2021-08-24: qty 100

## 2021-08-24 MED ORDER — SODIUM CHLORIDE 0.9 % IV SOLN
25.0000 mg/m2 | Freq: Once | INTRAVENOUS | Status: AC
  Administered 2021-08-24: 43 mg via INTRAVENOUS
  Filled 2021-08-24: qty 43

## 2021-08-24 MED ORDER — SODIUM CHLORIDE 0.9 % IV SOLN: Freq: Once | INTRAVENOUS | Status: AC

## 2021-08-24 MED ORDER — SODIUM CHLORIDE 0.9% FLUSH
10.0000 mL | INTRAVENOUS | Status: DC | PRN
  Administered 2021-08-24: 10 mL

## 2021-08-24 NOTE — Patient Instructions (Signed)
[facility] ONCOLOGY  Discharge Instructions: Thank you for choosing [facility] to provide your oncology and hematology care.   If you have a lab appointment with the [facility], please go directly to the [facility] and check in at the registration area.   Wear comfortable clothing and clothing appropriate for easy access to any Portacath or PICC line.   We strive to give you quality time with your provider. You may need to reschedule your appointment if you arrive late (15 or more minutes).  Arriving late affects you and other patients whose appointments are after yours.  Also, if you miss three or more appointments without notifying the office, you may be dismissed from the clinic at the provider's discretion.      For prescription refill requests, have your pharmacy contact our office and allow 72 hours for refills to be completed.    Today you received the following chemotherapy and/or immunotherapy agents Gemzar & Cisplatin      To help prevent nausea and vomiting after your treatment, we encourage you to take your nausea medication as directed.  BELOW ARE SYMPTOMS THAT SHOULD BE REPORTED IMMEDIATELY: *FEVER GREATER THAN 100.4 F (38 C) OR HIGHER *CHILLS OR SWEATING *NAUSEA AND VOMITING THAT IS NOT CONTROLLED WITH YOUR NAUSEA MEDICATION *UNUSUAL SHORTNESS OF BREATH *UNUSUAL BRUISING OR BLEEDING *URINARY PROBLEMS (pain or burning when urinating, or frequent urination) *BOWEL PROBLEMS (unusual diarrhea, constipation, pain near the anus) TENDERNESS IN MOUTH AND THROAT WITH OR WITHOUT PRESENCE OF ULCERS (sore throat, sores in mouth, or a toothache) UNUSUAL RASH, SWELLING OR PAIN  UNUSUAL VAGINAL DISCHARGE OR ITCHING   Items with * indicate a potential emergency and should be followed up as soon as possible or go to the Emergency Department if any problems should occur.  Please show the CHEMOTHERAPY ALERT CARD or IMMUNOTHERAPY ALERT CARD at  check-in to the Emergency Department and triage nurse.  Should you have questions after your visit or need to cancel or reschedule your appointment, please contact [facility]  Dept: [phone]  and follow the prompts.  Office hours are 8:00 a.m. to 4:30 p.m. Monday - Friday. Please note that voicemails left after 4:00 p.m. may not be returned until the following business day.  We are closed weekends and major holidays. You have access to a nurse at all times for urgent questions. Please call the main number to the clinic Dept: [phone] and follow the prompts.   For any non-urgent questions, you may also contact your provider using MyChart. We now offer e-Visits for anyone 3 and older to request care online for non-urgent symptoms. For details visit [email or web address].   Also download the MyChart app! Go to the app store, search "MyChart", open the app, select [facility], and log in with your MyChart username and password.  Due to Covid, a mask is required upon entering the hospital/clinic. If you do not have a mask, one will be given to you upon arrival. For doctor visits, patients may have 1 support person aged 78 or older with them. For treatment visits, patients cannot have anyone with them due to current Covid guidelines and our immunocompromised population.   Gemcitabine injection ?y l thu?c g? GEMCITABINE l thu?c ha tr? li?u. Thu?c ny ???c dng ?? ?i?u tr? nhi?u lo?i ung th?, ch?ng h?n nh? ung th? v, ung th? ph?i, ung th? tuy?n t?y, v ung th? bu?ng tr?ng. Thu?c ny c th? ???c dng  cho nh?ng m?c ?ch khc; hy h?i ng??i cung c?p d?ch v? y t? ho?c d??c s? c?a mnh, n?u qu v? c th?c m?c. (CC) NHN HI?U PH? BI?N: Gemzar, Infugem Ti c?n ph?i bo cho ng??i cung c?p d?ch v? y t? c?a mnh ?i?u g tr??c khi dng thu?c ny? H? c?n bi?t li?u qu v? c b?t k? tnh tr?ng no sau ?y khng: cc r?i lo?n v? mu nhi?m trng b?nh th?n b?nh  gan b?nh ph?i ho??c h h?p, ch??ng ha?n nh? hen suy?n m?i x? tr? g?n ?y ho?c ?ang x? tr? pha?n ??ng b?t th???ng ho??c di? ??ng v??i gemcitabine ho?c thu?c ha tr? li?u khc pha?n ??ng b?t th???ng ho??c di? ??ng v??i ca?c d??c ph?m kha?c, th?c ph?m, thu?c nhu?m, ho??c ch?t ba?o qua?n ?ang c thai ho??c ??nh co? thai ?ang cho con bu? Ti nn s? d?ng thu?c ny nh? th? no? Thu?c ny ?? truy?n vo t?nh m?ch. Thu?c ny ???c cho trong b?nh vi?n ho?c phng m?ch b?i chuyn vin y t? ???c hu?n luy?n ??c bi?t. Hy bn v?i bc s? nhi khoa c?a qu v? v? vi?c dng thu?c ny ? tr? em. C th? c?n ch?m [place] ??c bi?t. Qu li?u: N?u qu v? cho r?ng mnh ? dng qu nhi?u thu?c ny, th hy lin l?c v?i trung tm ki?m sot ch?t ??c ho?c phng c?p c?u ngay l?p t?c. L?U : Thu?c ny ch? dnh ring cho qu v?. Khng chia s? thu?c ny v?i nh?ng ng??i khc. N?u ti l? qun m?t li?u th sao? ?i?u quan tr?ng l khng nn b? l? li?u thu?c no. Hy lin l?c v?i bc s? ho?c [place] vin y t? c?a mnh, n?u qu v? khng th? gi? ?ng cu?c h?n khm. Nh?ng g c th? t??ng tc v?i thu?c ny? m?t s? thu?c lm t?ng s? l??ng t? bo mu, ch?ng h?n nh? filgrastim, pegfilgrastim, sargramostim m?t s? thu?c ha tr? li?u khc, ch?ng h?n nh? cisplatin cc thu?c ch?ng ng?a Hy bo cho bc s? ho?c chuyn vin y t? c?a mnh tr??c khi dng b?t k? thu?c no trong s? nh?ng thu?c ny: acetaminophen aspirin ibuprofen ketoprofen naproxen Danh sch ny c th? khng m t? ?? h?t cc t??ng tc c th? x?y ra. Hy ??a cho ng??i cung c?p d?ch v? y t? c?a mnh danh sch t?t c? cc thu?c, th?o d??c, cc thu?c khng c?n toa, ho?c cc ch? ph?m b? sung m qu v? dng. C?ng nn bo cho h? bi?t r?ng qu v? c ht thu?c, u?ng r??u, ho?c c s? d?ng ma ty tri php hay khng. Vi th? c th? t??ng tc v?i thu?c c?a qu v?. Ti c?n ph?i theo di ?i?u g trong khi dng thu?c ny? Hy ?i g?p bc s? ho?c [place] vin y t? ?? theo di ??nh k? s? c?i thi?n c?a  qu v?. Thu?c ny c th? lm cho qu v? c?m th?y khng ???c kh?e nh? th??ng l?. ?i?u ny khng ph?i khng ph? bi?n, b?i v thu?c ha tr? li?u c th? ?nh h??ng ??n c? t? bo lnh l?n t? bo ung th?. Hy t??ng trnh m?i tc d?ng ph?. Hy ti?p t?c ??t ?i?u tr? c?a mnh ngay c? khi qu v? c?m th?y m?t, tr? khi bc s? yu c?u qu v? ng?ng ?i?u tr?[place] m?t s? tr??ng h?p, qu v? c th? ???c cho dng cc thu?c ph? thm ?? gip gi?m tc d?ng ph?. Hy lm theo t?t c? cc h??ng d?n v? vi?c s? d?ng  chng. Hy h?i  ki?n bc s? ho?c chuyn vin y t?, n?u qu v? b? s?t, ?n l?nh ho?c ?au h?ng, ho?c c cc tri?u ch?ng khc c?a c?m l?nh ho?c cm. Khng ???c t? ?i?u tr? cho mnh. Thu?c ny c th? lm gi?m kh? n?ng ch?ng l?i cc b?nh nhi?m trng c?a c? th?. Hy c? trnh ? g?n nh?ng ng??i b? b?nh. Thu?c ny c th? lm t?ng nguy c? b? b?m tm ho?c ch?y mu. Hy lin l?c v?i bc s? ho?c chuyn vin y t?, n?u qu v? th?y ch?y mu b?t th??ng. Hy c?n th?n khi ?nh r?ng ho?c x?a r?ng b?ng ch? nha khoa ho?c b?ng t?m, b?i v qu v? c th? d? b? nhi?m trng ho?c d? b? ch?y mu h?n. N?u qu v? c ?i lm r?ng, th hy bo v?i nha s? r?ng qu v? ?ang dng thu?c ny. Trnh dng cc thu?c c ch?a aspirin, acetaminophen, ibuprofen, naproxen, ho?c ketoprofen, tr? khi ? ???c bc s? ch? d?n. Cc thu?c ny c th? che l?p tri?u ch?ng s?t. Ph? n? dng thu?c ny ph?i trnh c thai trong khi dng thu?c v 6 thng sau khi ng?ng dng thu?c. Ph? n? c?n ph?i thng bo cho bc s? c?a mnh, n?u mu?n c thai ho?c ngh? r?ng c th? mnh ? c [place]. Nam gi??i khng nn gy thu? thai trong khi du?ng thu?c na?y va? trong vng 3 tha?ng sau khi ng?ng thu?c. C nguy c? v? cc tc d?ng ph? nghim tr?ng ??i v?i [place] nhi. Hy th?o lu?n v?i bc s? ho?c chuyn vin y t? ho?c d??c s? ?? bi?t thm thng tin. Khng ???c cho tr? b m? trong khi ?ang dng thu?c ny ho?c t?i thi?u 1 tu?n sau khi ng?ng dng thu?c. Nam gi?i c?n ph?i thng bo cho bc s? c?a mnh, n?u h?  mu?n ???c lm b?. Thu?c ny c th? lm gi?m s? l??ng tinh trng. Hy th?o lu?n v?i bc s? ho?c [place] vin y t? c?a mnh, n?u qu v? lo l??ng v? kha? n?ng thu? tinh c?a mnh. Ti c th? nh?n th?y nh?ng tc d?ng ph? no khi dng thu?c ny? Nh?ng tc d?ng ph? qu v? c?n ph?i bo cho bc s? ho?c chuyn vin y t? cng s?m cng t?t: cc ph?n ?ng d? ?ng, ch?ng h?n nh? da b? m?n ??, ng?a, n?i my ?ay, s?ng ? m?t, mi, ho?c l??i kh th? ?au, ??, ng?a, ho?c kch ?ng ? ch? tim cc d?u hi?u v tri?u ch?ng c?a s? thay ??i nguy hi?m c?a nh?p tim, ch?ng h?n nh? ?au ng?c, chng m?t, nh?p tim nhanh ho?c khng ??u, ?nh tr?ng ng?c; c?m th?y chong vng ho?c ng?t x?u, b? t; cc v?n ?? v? h h?p cc d?u hi?u gi?m ti?u c?u ho?c xu?t huy?t - b?m tm, cc n?t l?m t?m ?? trn da, phn c mu ?en, mu h?c n, c mu trong n??c ti?u cc d?u hi?u gi?m s? l??ng t? bo h?ng c?u - c?m th?y y?u ?t ho?c m?t m?i m?t cch b?t th??ng, cc c?n ng?t x?u, chong vng cc d?u hi?u nhi?m trng - s?t ho?c ?n l?nh, ho, ?au h?ng, kh ?i ti?u ho?c ?i ti?u ?au cc d?u hi?u v tri?u ch?ng t?n th??ng th?n, nh? kh ?i ti?u ho?c thay ??i l??ng n??c ti?u cc d?u hi?u v tri?u ch?ng t?n th??ng gan, ch?ng h?n nh? n??c ti?u mu nu ho?c vng s?m; c?m gic b? b?nh ki?u chung chung ho?c cc tri?u ch?ng gi?ng nh? cm;  phn b?c mu; m?t c?m gic ngon mi?ng; bu?n i; ?au vng b?ng trn; y?u ?t ho?c m?t m?i khc th??ng; vng da ho?c m?t s?ng ? m?t c chn, bn chn, ho?c bn tay Cc tc d?ng ph? khng c?n ph?i ch?m [place] y t? (hy bo cho bc s? ho?c chuyn vin y t?, n?u cc tc d?ng ph? ny ti?p di?n ho?c gy phi?n toi): to bn tiu ch?y r?ng tc m?t c?m gic ngon mi?ng bu?n i n?i ban i m?a Danh sch ny c th? khng m t? ?? h?t cc tc d?ng ph? c th? x?y ra. Xin g?i t?i bc s? c?a mnh ?? ???c c? v?n chuyn mn v? cc tc d?ng ph?Sander Nephew v? c th? t??ng trnh cc tc d?ng ph? cho FDA theo s? [phone]. Ti nn c?t gi? thu?c c?a mnh ?  ?u? Thu?c ny ???c s? d?ng b?i chuyn vin y t? ? b?nh vi?n ho?c ? phng m?ch. Qu v? s? khng ???c c?p thu?c ny ?? c?t gi? t?i nh. L?U : ?y l b?n tm t?t. N c th? khng bao hm t?t c? thng tin c th? c. N?u qu v? th?c m?c v? thu?c ny, xin trao ??i v?i bc s?, d??c s?, ho?c ng??i cung c?p d?ch v? y t? c?a mnh.  2022 Elsevier/Gold Standard (2018-02-07 00:00:00)  Cisplatin injection ?y l thu?c g? CISPLATIN l thu?c ha tr? li?u. N nh?m ??n cc t? bo phn chia nhanh, nh? cc t? bo ung th?, v lm cho cc t? bo ? ch?t. Thu?c ny ???c dng ?? ?i?u tr? nhi?u lo?i ung th?, ch?ng h?n nh? ung th? b?ng ?i, ung th? bu?ng tr?ng, v [place] th? tinh hon. Thu?c ny c th? ???c dng cho nh?ng m?c ?ch khc; hy h?i ng??i cung c?p d?ch v? y t? ho?c d??c s? c?a mnh, n?u qu v? c th?c m?c. (CC) NHN HI?U PH? BI?N: Platinol, Platinol -AQ Ti c?n ph?i bo cho ng??i cung c?p d?ch v? y t? c?a mnh ?i?u g tr??c khi dng thu?c ny? H? c?n bi?t li?u qu v? c b?t k? tnh tr?ng no sau ?y khng: b?nh v? m?t, cc v?n ?? v? th? l?c lng tai b?nh th?n c s? l??ng t? bo mu th?p, ch?ng h?n nh? s? l??ng b?ch c?u, ti?u c?u ho?c h?ng c?u th?p c?m gic nh? b? ki?n b ? ngn tay ho?c ngn chn, ho?c cc r?i lo?n th?n kinh khc pha?n ??ng b?t th???ng ho??c di? ??ng v??i cisplatin, carboplatin, ho?c oxaliplatin pha?n ??ng b?t th???ng ho??c di? ??ng v??i ca?c d??c ph?m kha?c, th?c ph?m, thu?c nhu?m, ho??c ch?t ba?o qua?n ?ang c thai ho??c ??nh co? thai ?ang cho con bu? Ti nn s? d?ng thu?c ny nh? th? no? Thu?c ny ?? truy?n vo t?nh m?ch. Thu?c ny ???c cho trong b?nh vi?n ho?c phng m?ch b?i chuyn vin y t? ???c hu?n luy?n ??c bi?t. Hy bn v?i bc s? nhi khoa c?a qu v? v? vi?c dng thu?c ny ? tr? em. C th? c?n ch?m [place] ??c bi?t. Qu li?u: N?u qu v? cho r?ng mnh ? dng qu nhi?u thu?c ny, th hy lin l?c v?i trung tm ki?m sot ch?t ??c ho?c phng c?p c?u ngay l?p t?c. L?U : Thu?c  ny ch? dnh ring cho qu v?. Khng chia s? thu?c ny v?i nh?ng ng??i khc. N?u ti l? qun m?t li?u th sao? ?i?u quan tr?ng l khng nn b? l? li?u thu?c no. Hy lin l?c v?i bc s? ho?c  chuyn vin y t? c?a mnh, n?u qu v? khng th? gi? ?ng cu?c h?n khm. Nh?ng g c th? t??ng tc v?i thu?c ny? Thu?c ny c th? t??ng tc v?i cc thu?c sau ?y: foscarnet m?t s? thu?c khng sinh, ch?ng h?n nh? amikacin, gentamicin, neomycin, polymyxin B, streptomycin, tobramycin, vancomycin Danh sch ny c th? khng m t? ?? h?t cc t??ng tc c th? x?y ra. Hy ??a cho ng??i cung c?p d?ch v? y t? c?a mnh danh sch t?t c? cc thu?c, th?o d??c, cc thu?c khng c?n toa, ho?c cc ch? ph?m b? sung m qu v? dng. C?ng nn bo cho h? bi?t r?ng qu v? c ht thu?c, u?ng r??u, ho?c c s? d?ng ma ty tri php hay khng. Vi th? c th? t??ng tc v?i thu?c c?a qu v?. Ti c?n ph?i theo di ?i?u g trong khi dng thu?c ny? Qu v? s? ???c theo di ch?t ch? trong khi dng thu?c ny. Qu v? s? c?n ph?i ?i lm cc xt nghi?m mu quan tr?ng trong th?i gian dng thu?c ny. Thu?c ny c th? lm cho qu v? c?m th?y khng ???c kh?e nh? th??ng l?. ?i?u ny khng ph?i khng ph? bi?n, b?i v thu?c ha tr? li?u c th? ?nh h??ng ??n c? t? bo lnh l?n t? bo ung th?. Hy t??ng trnh m?i tc d?ng ph?. Hy ti?p t?c ??t ?i?u tr? c?a mnh ngay c? khi qu v? c?m th?y m?t, tr? khi bc s? yu c?u qu v? ng?ng ?i?u tr?. Thu?c ny c th? lm t?ng nguy c? b? nhi?m trng. Hy h?i  ki?n bc s? ho?c [place] vin y t? c?a mnh, n?u qu v? b? s?t, ?n l?nh, ho?c b? ?au h?ng, hay c cc tri?u ch?ng khc c?a cm hay c?m l?nh. Khng ???c t? ?i?u tr? cho mnh. Hy c? trnh ? g?n nh?ng ng??i b? b?nh. Trnh dng cc thu?c c ch?a aspirin, acetaminophen, ibuprofen, naproxen ho?c ketoprofen, tr? khi ???c bc s? ho?c chuyn vin y t? c?a mnh ch? d?n. Cc thu?c ny c th? che l?p tri?u ch?ng s?t. Thu?c ny c th? lm t?ng nguy c? b? b?m tm ho?c ch?y mu. Hy  lin l?c v?i bc s? ho?c chuyn vin y t?, n?u qu v? th?y ch?y mu b?t th??ng. Hy c?n th?n khi ?nh r?ng ho?c x?a r?ng b?ng ch? nha khoa ho?c b?ng t?m, b?i v qu v? c th? d? b? nhi?m trng ho?c d? b? ch?y mu h?n. N?u qu v? c ?i lm r?ng, th hy bo v?i nha s? r?ng qu v? ?ang dng thu?c ny. Khng ???c c thai khi ?ang dng thu?c ny, ho?c trong vng 14 thng sau khi ng?ng dng thu?c. Ph? n? nn bo cho [place] vin y t? c?a mnh bi?t, n?u mu?n c thai ho?c ngh? r?ng c th? mnh ? c [place]. Nam gi??i khng nn gy thu? thai trong khi du?ng thu?c na?y va? trong vng 11 tha?ng sau khi ng?ng dng thu?c. C nguy c? v? cc tc d?ng ph? nghim tr?ng ??i v?i [place] nhi. Hy th?o lu?n v?i bc s? ho?c chuyn vin y t? ?? bi?t thm thng tin. Khng ???c nui con b?ng s?a m? trong khi dng thu?c ny. Thu?c ny ?a? gy suy bu?ng tr??ng ?? m?t s? phu? n??. Thu?c ny c th? lm kh ??u thai h?n. Hy th?o lu?n v?i bc s? ho?c [place] vin y t? c?a mnh, n?u qu v? lo l??ng v? kha? n?ng thu? tinh c?a mnh. Thu?c  ny ? gy gi?m s? l??ng tinh trng ? m?t s? v? nam gi?i. Thu?c ny c th? lm kh gy th? thai h?n. Hy th?o lu?n v?i bc s? ho?c [place] vin y t? c?a mnh, n?u qu v? lo l??ng v? kha? n?ng thu? tinh c?a mnh. Hy u?ng cc ch?t l?ng nh? ? ???c ch? d?n trong khi qu v? dng thu?c ny. ?i?u ny s? gip b?o v? th?n. Hy lin l?c v?i bc s? ho?c chuyn vin y t? n?u qu v? b? tiu ch?y. Khng ???c t? ?i?u tr? cho mnh. Ti c th? nh?n th?y nh?ng tc d?ng ph? no khi dng thu?c ny? Nh?ng tc d?ng ph? qu v? c?n ph?i bo cho bc s? ho?c chuyn vin y t? cng s?m cng t?t: cc ph?n ?ng d? ?ng, ch?ng h?n nh? da b? m?n ??, ng?a, n?i my ?ay, s?ng ? m?t, mi, ho?c l??i m? m?t thay ??i th? l?c b? gi?m thnh l?c ho?c  tai bu?n i ho?c i m?a ?au, ??, ng?a, ho?c kch ?ng ? ch? tim ?au, t ho?c c?m gic nh? b? ki?n b ? bn tay ho?c bn chn c cc d?u hi?u v tri?u ch?ng c?a tnh tr?ng xu?t huy?t, nh? l  phn c mu ho?c c mu ?en, mu h?c n; n??c ti?u mu ?? ho?c mu nu s?m; kh?c ra mu ho?c ra ch?t mu nu gi?ng nh? b?t c ph; cc ??m ?? trn da, cc v?t b?m tm ho?c ch?y mu b?t th??ng ? m?t; n??u r?ng ho?c m?i c cc d?u hi?u v tri?u ch?ng c?a nhi?m trng, ch?ng h?n nh? b? s?t; ?n l?nh; ho; ?au h?ng; kh ?i ti?u ho?c ?i ti?u ?au cc d?u hi?u v tri?u ch?ng t?n th??ng th?n, nh? kh ?i ti?u ho?c thay ??i l??ng n??c ti?u c cc d?u hi?u v tri?u ch?ng b? th?p t? bo h?ng c?u ho?c thi?u mu, ch?ng h?n nh? c?m th?y y?u ?t ho?c m?t m?i khc th??ng; b? ng?t x?u ho?c c?m th?y chong vng; b? ng; c cc v?n ?? v? h h?p Cc tc d?ng ph? khng c?n ph?i ch?m [place] y t? (hy bo cho bc s? ho?c chuyn vin y t?, n?u cc tc d?ng ph? ny ti?p di?n ho?c gy phi?n toi): m?t c?m gic ngon mi?ng lot mi?ng co th?t c? b?p ho?c v?p b? Danh sch ny c th? khng m t? ?? h?t cc tc d?ng ph? c th? x?y ra. Xin g?i t?i bc s? c?a mnh ?? ???c c? v?n chuyn mn v? cc tc d?ng ph?Sander Nephew v? c th? t??ng trnh cc tc d?ng ph? cho FDA theo s? [phone]. Ti nn c?t gi? thu?c c?a mnh ? ?u? Thu?c ny ???c s? d?ng b?i chuyn vin y t? ? b?nh vi?n ho?c ? phng m?ch. Qu v? s? khng ???c c?p thu?c ny ?? c?t gi? t?i nh. L?U : ?y l b?n tm t?t. N c th? khng bao hm t?t c? thng tin c th? c. N?u qu v? th?c m?c v? thu?c ny, xin trao ??i v?i bc s?, d??c s?, ho?c ng??i cung c?p d?ch v? y t? c?a mnh.  2022 Elsevier/Gold Standard (2020-01-31 00:00:00)

## 2021-08-27 ENCOUNTER — Other Ambulatory Visit: Payer: Self-pay

## 2021-08-27 DIAGNOSIS — C7951 Secondary malignant neoplasm of bone: Secondary | ICD-10-CM

## 2021-09-04 ENCOUNTER — Other Ambulatory Visit: Payer: Self-pay

## 2021-09-04 ENCOUNTER — Inpatient Hospital Stay: Payer: Managed Care, Other (non HMO)

## 2021-09-04 DIAGNOSIS — Z5111 Encounter for antineoplastic chemotherapy: Secondary | ICD-10-CM | POA: Diagnosis not present

## 2021-09-04 DIAGNOSIS — C249 Malignant neoplasm of biliary tract, unspecified: Secondary | ICD-10-CM

## 2021-09-04 LAB — CMP (CANCER CENTER ONLY)
ALT: 22 U/L (ref 0–44)
AST: 15 U/L (ref 15–41)
Albumin: 3.8 g/dL (ref 3.5–5.0)
Alkaline Phosphatase: 123 U/L (ref 38–126)
Anion gap: 9 (ref 5–15)
BUN: 20 mg/dL (ref 8–23)
CO2: 26 mmol/L (ref 22–32)
Calcium: 8.7 mg/dL — ABNORMAL LOW (ref 8.9–10.3)
Chloride: 109 mmol/L (ref 98–111)
Creatinine: 0.78 mg/dL (ref 0.61–1.24)
GFR, Estimated: >60 mL/min (ref >60)
Glucose, Bld: 131 mg/dL — ABNORMAL HIGH (ref 70–99)
Potassium: 4.5 mmol/L (ref 3.5–5.1)
Sodium: 144 mmol/L (ref 135–145)
Total Bilirubin: 0.3 mg/dL (ref 0.3–1.2)
Total Protein: 6.6 g/dL (ref 6.5–8.1)

## 2021-09-04 LAB — CBC WITH DIFFERENTIAL (CANCER CENTER ONLY)
Abs Immature Granulocytes: 0 10*3/uL (ref 0.00–0.07)
Basophils Absolute: 0 10*3/uL (ref 0.0–0.1)
Basophils Relative: 0 %
Eosinophils Absolute: 0.1 10*3/uL (ref 0.0–0.5)
Eosinophils Relative: 2 %
HCT: 34 % — ABNORMAL LOW (ref 39.0–52.0)
Hemoglobin: 11.2 g/dL — ABNORMAL LOW (ref 13.0–17.0)
Immature Granulocytes: 0 %
Lymphocytes Relative: 26 %
Lymphs Abs: 0.9 10*3/uL (ref 0.7–4.0)
MCH: 33.3 pg (ref 26.0–34.0)
MCHC: 32.9 g/dL (ref 30.0–36.0)
MCV: 101.2 fL — ABNORMAL HIGH (ref 80.0–100.0)
Monocytes Absolute: 0.5 10*3/uL (ref 0.1–1.0)
Monocytes Relative: 16 %
Neutro Abs: 1.9 10*3/uL (ref 1.7–7.7)
Neutrophils Relative %: 56 %
Platelet Count: 107 10*3/uL — ABNORMAL LOW (ref 150–400)
RBC: 3.36 MIL/uL — ABNORMAL LOW (ref 4.22–5.81)
RDW: 17.8 % — ABNORMAL HIGH (ref 11.5–15.5)
WBC Count: 3.4 10*3/uL — ABNORMAL LOW (ref 4.0–10.5)
nRBC: 0.6 % — ABNORMAL HIGH (ref 0.0–0.2)

## 2021-09-04 LAB — MAGNESIUM: Magnesium: 2.1 mg/dL (ref 1.7–2.4)

## 2021-09-04 MED FILL — Dexamethasone Sodium Phosphate Inj 100 MG/10ML: INTRAMUSCULAR | Qty: 1 | Status: AC

## 2021-09-04 MED FILL — Fosaprepitant Dimeglumine For IV Infusion 150 MG (Base Eq): INTRAVENOUS | Qty: 5 | Status: AC

## 2021-09-07 ENCOUNTER — Other Ambulatory Visit: Payer: Self-pay

## 2021-09-07 ENCOUNTER — Inpatient Hospital Stay: Payer: Managed Care, Other (non HMO)

## 2021-09-07 VITALS — BP 152/89 | HR 71 | Temp 98.1°F | Resp 17 | Wt 150.2 lb

## 2021-09-07 DIAGNOSIS — C249 Malignant neoplasm of biliary tract, unspecified: Secondary | ICD-10-CM

## 2021-09-07 DIAGNOSIS — Z5111 Encounter for antineoplastic chemotherapy: Secondary | ICD-10-CM | POA: Diagnosis not present

## 2021-09-07 MED ORDER — SODIUM CHLORIDE 0.9 % IV SOLN
25.0000 mg/m2 | Freq: Once | INTRAVENOUS | Status: AC
  Administered 2021-09-07: 14:00:00 43 mg via INTRAVENOUS
  Filled 2021-09-07: qty 43

## 2021-09-07 MED ORDER — SODIUM CHLORIDE 0.9 % IV SOLN
1500.0000 mg | Freq: Once | INTRAVENOUS | Status: AC
  Administered 2021-09-07: 12:00:00 1500 mg via INTRAVENOUS
  Filled 2021-09-07: qty 30

## 2021-09-07 MED ORDER — SODIUM CHLORIDE 0.9 % IV SOLN
1000.0000 mg/m2 | Freq: Once | INTRAVENOUS | Status: AC
  Administered 2021-09-07: 13:00:00 1710 mg via INTRAVENOUS
  Filled 2021-09-07: qty 44.97

## 2021-09-07 MED ORDER — MAGNESIUM SULFATE 2 GM/50ML IV SOLN
2.0000 g | Freq: Once | INTRAVENOUS | Status: AC
  Administered 2021-09-07: 09:00:00 2 g via INTRAVENOUS
  Filled 2021-09-07: qty 50

## 2021-09-07 MED ORDER — FAMOTIDINE 20 MG IN NS 100 ML IVPB
20.0000 mg | Freq: Once | INTRAVENOUS | Status: AC
  Administered 2021-09-07: 11:00:00 20 mg via INTRAVENOUS
  Filled 2021-09-07: qty 100

## 2021-09-07 MED ORDER — SODIUM CHLORIDE 0.9% FLUSH
10.0000 mL | INTRAVENOUS | Status: DC | PRN
  Administered 2021-09-07: 17:00:00 10 mL

## 2021-09-07 MED ORDER — SODIUM CHLORIDE 0.9 % IV SOLN
150.0000 mg | Freq: Once | INTRAVENOUS | Status: AC
  Administered 2021-09-07: 12:00:00 150 mg via INTRAVENOUS
  Filled 2021-09-07: qty 150

## 2021-09-07 MED ORDER — HEPARIN SOD (PORK) LOCK FLUSH 100 UNIT/ML IV SOLN
500.0000 [IU] | Freq: Once | INTRAVENOUS | Status: AC | PRN
  Administered 2021-09-07: 17:00:00 500 [IU]

## 2021-09-07 MED ORDER — POTASSIUM CHLORIDE IN NACL 20-0.9 MEQ/L-% IV SOLN
Freq: Once | INTRAVENOUS | Status: AC
  Filled 2021-09-07: qty 1000

## 2021-09-07 MED ORDER — SODIUM CHLORIDE 0.9 % IV SOLN
10.0000 mg | Freq: Once | INTRAVENOUS | Status: AC
  Administered 2021-09-07: 12:00:00 10 mg via INTRAVENOUS
  Filled 2021-09-07: qty 10

## 2021-09-07 MED ORDER — PALONOSETRON HCL INJECTION 0.25 MG/5ML
0.2500 mg | Freq: Once | INTRAVENOUS | Status: AC
  Administered 2021-09-07: 11:00:00 0.25 mg via INTRAVENOUS
  Filled 2021-09-07: qty 5

## 2021-09-07 MED ORDER — SODIUM CHLORIDE 0.9 % IV SOLN: Freq: Once | INTRAVENOUS | Status: AC

## 2021-09-07 NOTE — Patient Instructions (Signed)
[facility] ONCOLOGY  Discharge Instructions: Thank you for choosing [facility] to provide your oncology and hematology care.   If you have a lab appointment with the [facility], please go directly to the [facility] and check in at the registration area.   Wear comfortable clothing and clothing appropriate for easy access to any Portacath or PICC line.   We strive to give you quality time with your provider. You may need to reschedule your appointment if you arrive late (15 or more minutes).  Arriving late affects you and other patients whose appointments are after yours.  Also, if you miss three or more appointments without notifying the office, you may be dismissed from the clinic at the provider's discretion.      For prescription refill requests, have your pharmacy contact our office and allow 72 hours for refills to be completed.    Today you received the following chemotherapy and/or immunotherapy agents: Imfinzi, Gemzar, & Cisplatin     To help prevent nausea and vomiting after your treatment, we encourage you to take your nausea medication as directed.  BELOW ARE SYMPTOMS THAT SHOULD BE REPORTED IMMEDIATELY: *FEVER GREATER THAN 100.4 F (38 C) OR HIGHER *CHILLS OR SWEATING *NAUSEA AND VOMITING THAT IS NOT CONTROLLED WITH YOUR NAUSEA MEDICATION *UNUSUAL SHORTNESS OF BREATH *UNUSUAL BRUISING OR BLEEDING *URINARY PROBLEMS (pain or burning when urinating, or frequent urination) *BOWEL PROBLEMS (unusual diarrhea, constipation, pain near the anus) TENDERNESS IN MOUTH AND THROAT WITH OR WITHOUT PRESENCE OF ULCERS (sore throat, sores in mouth, or a toothache) UNUSUAL RASH, SWELLING OR PAIN  UNUSUAL VAGINAL DISCHARGE OR ITCHING   Items with * indicate a potential emergency and should be followed up as soon as possible or go to the Emergency Department if any problems should occur.  Please show the CHEMOTHERAPY ALERT CARD or IMMUNOTHERAPY ALERT  CARD at check-in to the Emergency Department and triage nurse.  Should you have questions after your visit or need to cancel or reschedule your appointment, please contact [facility]  Dept: [phone]  and follow the prompts.  Office hours are 8:00 a.m. to 4:30 p.m. Monday - Friday. Please note that voicemails left after 4:00 p.m. may not be returned until the following business day.  We are closed weekends and major holidays. You have access to a nurse at all times for urgent questions. Please call the main number to the clinic Dept: [phone] and follow the prompts.   For any non-urgent questions, you may also contact your provider using MyChart. We now offer e-Visits for anyone 66 and older to request care online for non-urgent symptoms. For details visit [email or web address].   Also download the MyChart app! Go to the app store, search "MyChart", open the app, select [facility], and log in with your MyChart username and password.  Due to Covid, a mask is required upon entering the hospital/clinic. If you do not have a mask, one will be given to you upon arrival. For doctor visits, patients may have 1 support person aged 48 or older with them. For treatment visits, patients cannot have anyone with them due to current Covid guidelines and our immunocompromised population.

## 2021-09-14 ENCOUNTER — Ambulatory Visit: Payer: Self-pay | Admitting: Radiation Oncology

## 2021-09-17 ENCOUNTER — Inpatient Hospital Stay: Admission: RE | Admit: 2021-09-17 | Payer: Managed Care, Other (non HMO) | Source: Ambulatory Visit

## 2021-09-18 ENCOUNTER — Inpatient Hospital Stay (HOSPITAL_BASED_OUTPATIENT_CLINIC_OR_DEPARTMENT_OTHER): Payer: Managed Care, Other (non HMO) | Admitting: Hematology and Oncology

## 2021-09-18 ENCOUNTER — Inpatient Hospital Stay: Payer: Managed Care, Other (non HMO) | Attending: Radiation Oncology

## 2021-09-18 ENCOUNTER — Telehealth: Payer: Self-pay

## 2021-09-18 ENCOUNTER — Encounter: Payer: Self-pay | Admitting: Hematology and Oncology

## 2021-09-18 ENCOUNTER — Other Ambulatory Visit (HOSPITAL_COMMUNITY): Payer: Self-pay

## 2021-09-18 DIAGNOSIS — C7951 Secondary malignant neoplasm of bone: Secondary | ICD-10-CM | POA: Diagnosis present

## 2021-09-18 DIAGNOSIS — C249 Malignant neoplasm of biliary tract, unspecified: Secondary | ICD-10-CM

## 2021-09-18 DIAGNOSIS — Z79899 Other long term (current) drug therapy: Secondary | ICD-10-CM | POA: Diagnosis not present

## 2021-09-18 DIAGNOSIS — M8458XA Pathological fracture in neoplastic disease, other specified site, initial encounter for fracture: Secondary | ICD-10-CM

## 2021-09-18 DIAGNOSIS — K5909 Other constipation: Secondary | ICD-10-CM

## 2021-09-18 DIAGNOSIS — Z86711 Personal history of pulmonary embolism: Secondary | ICD-10-CM | POA: Insufficient documentation

## 2021-09-18 DIAGNOSIS — Z5111 Encounter for antineoplastic chemotherapy: Secondary | ICD-10-CM | POA: Diagnosis not present

## 2021-09-18 DIAGNOSIS — C221 Intrahepatic bile duct carcinoma: Secondary | ICD-10-CM | POA: Diagnosis present

## 2021-09-18 DIAGNOSIS — Z7901 Long term (current) use of anticoagulants: Secondary | ICD-10-CM | POA: Insufficient documentation

## 2021-09-18 DIAGNOSIS — I2699 Other pulmonary embolism without acute cor pulmonale: Secondary | ICD-10-CM

## 2021-09-18 DIAGNOSIS — D61818 Other pancytopenia: Secondary | ICD-10-CM | POA: Diagnosis not present

## 2021-09-18 DIAGNOSIS — Z7952 Long term (current) use of systemic steroids: Secondary | ICD-10-CM | POA: Insufficient documentation

## 2021-09-18 DIAGNOSIS — R7989 Other specified abnormal findings of blood chemistry: Secondary | ICD-10-CM | POA: Diagnosis not present

## 2021-09-18 LAB — CMP (CANCER CENTER ONLY)
ALT: 36 U/L (ref 0–44)
AST: 24 U/L (ref 15–41)
Albumin: 3.7 g/dL (ref 3.5–5.0)
Alkaline Phosphatase: 135 U/L — ABNORMAL HIGH (ref 38–126)
Anion gap: 9 (ref 5–15)
BUN: 16 mg/dL (ref 8–23)
CO2: 24 mmol/L (ref 22–32)
Calcium: 8.8 mg/dL — ABNORMAL LOW (ref 8.9–10.3)
Chloride: 110 mmol/L (ref 98–111)
Creatinine: 0.73 mg/dL (ref 0.61–1.24)
GFR, Estimated: >60 mL/min (ref >60)
Glucose, Bld: 112 mg/dL — ABNORMAL HIGH (ref 70–99)
Potassium: 4.2 mmol/L (ref 3.5–5.1)
Sodium: 143 mmol/L (ref 135–145)
Total Bilirubin: 0.2 mg/dL — ABNORMAL LOW (ref 0.3–1.2)
Total Protein: 6.8 g/dL (ref 6.5–8.1)

## 2021-09-18 LAB — CBC WITH DIFFERENTIAL (CANCER CENTER ONLY)
Abs Immature Granulocytes: 0.01 10*3/uL (ref 0.00–0.07)
Basophils Absolute: 0 10*3/uL (ref 0.0–0.1)
Basophils Relative: 0 %
Eosinophils Absolute: 0 10*3/uL (ref 0.0–0.5)
Eosinophils Relative: 0 %
HCT: 33.8 % — ABNORMAL LOW (ref 39.0–52.0)
Hemoglobin: 11.4 g/dL — ABNORMAL LOW (ref 13.0–17.0)
Immature Granulocytes: 0 %
Lymphocytes Relative: 30 %
Lymphs Abs: 0.9 10*3/uL (ref 0.7–4.0)
MCH: 34.4 pg — ABNORMAL HIGH (ref 26.0–34.0)
MCHC: 33.7 g/dL (ref 30.0–36.0)
MCV: 102.1 fL — ABNORMAL HIGH (ref 80.0–100.0)
Monocytes Absolute: 0.5 10*3/uL (ref 0.1–1.0)
Monocytes Relative: 16 %
Neutro Abs: 1.7 10*3/uL (ref 1.7–7.7)
Neutrophils Relative %: 54 %
Platelet Count: 80 10*3/uL — ABNORMAL LOW (ref 150–400)
RBC: 3.31 MIL/uL — ABNORMAL LOW (ref 4.22–5.81)
RDW: 16.3 % — ABNORMAL HIGH (ref 11.5–15.5)
WBC Count: 3.1 10*3/uL — ABNORMAL LOW (ref 4.0–10.5)
nRBC: 0.6 % — ABNORMAL HIGH (ref 0.0–0.2)

## 2021-09-18 LAB — T4, FREE: Free T4: 0.8 ng/dL (ref 0.61–1.12)

## 2021-09-18 LAB — TSH: TSH: 1.35 u[IU]/mL (ref 0.320–4.118)

## 2021-09-18 LAB — MAGNESIUM: Magnesium: 2.1 mg/dL (ref 1.7–2.4)

## 2021-09-18 MED ORDER — APIXABAN 5 MG PO TABS
5.0000 mg | ORAL_TABLET | Freq: Two times a day (BID) | ORAL | 6 refills | Status: DC
  Filled 2021-09-18: qty 60, 30d supply, fill #0

## 2021-09-18 MED FILL — Fosaprepitant Dimeglumine For IV Infusion 150 MG (Base Eq): INTRAVENOUS | Qty: 5 | Status: AC

## 2021-09-18 MED FILL — Dexamethasone Sodium Phosphate Inj 100 MG/10ML: INTRAMUSCULAR | Qty: 1 | Status: AC

## 2021-09-18 NOTE — Progress Notes (Signed)
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   All questions were answered. The patient knows to call the clinic with any problems, questions or concerns. The total time spent in the appointment was 30 minutes encounter with patients including review of chart and various tests results, discussions about plan of care and coordination of care plan   Heath Lark, MD 09/18/2021 4:14 PM  INTERVAL HISTORY: Please see below for problem oriented charting. he returns for treatment follow-up with his son Interpreter is not present The patient forgot his appointment and show up several hours late He also missed his MRI appointment that was arranged by radiation oncologist to follow-up on history of pathological fracture on his back He has not needed any pain medicine recently Denies nausea or vomiting No peripheral neuropathy The patient denies any recent signs or symptoms of bleeding such as spontaneous epistaxis, hematuria or hematochezia.   REVIEW OF SYSTEMS:   Constitutional: Denies fevers, chills  or abnormal weight loss Eyes: Denies blurriness of vision Ears, nose, mouth, throat, and face: Denies mucositis or sore throat Respiratory: Denies cough, dyspnea or wheezes Cardiovascular: Denies palpitation, chest discomfort or lower extremity swelling Gastrointestinal:  Denies nausea, heartburn or change in bowel habits Skin: Denies abnormal skin rashes Lymphatics: Denies new lymphadenopathy or easy bruising Neurological:Denies numbness, tingling or new weaknesses Behavioral/Psych: Mood is stable, no new changes  All other systems were reviewed with the patient and are negative.  I have reviewed the past medical history, past surgical history, social history and family history with the patient and they are unchanged from previous note.  ALLERGIES:  has No Known Allergies.  MEDICATIONS:  Current Outpatient Medications  Medication Sig Dispense Refill   apixaban (ELIQUIS) 5 MG TABS tablet Take 1 tablet by mouth 2 times daily. 60 tablet 6   lidocaine-prilocaine (EMLA) cream Apply to affected area once 30 g 3   ondansetron (ZOFRAN) 8 MG tablet Take 1 tablet by mouth every 8 hours as needed. 30 tablet 1   oxyCODONE (OXY IR/ROXICODONE) 5 MG immediate release tablet Take 1 tablet (5 mg total) by mouth every 4 hours as needed for severe pain. 60 tablet 0   prochlorperazine (COMPAZINE) 10 MG tablet Take 1 tablet by mouth every 6 hours as needed (Nausea or vomiting). 30 tablet 1   senna-docusate (SENOKOT-S) 8.6-50 MG tablet Take 1 tablet by mouth 2 (two) times daily as needed for mild constipation or moderate constipation.     No current facility-administered medications for this visit.    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 He underwent liver biopsy   05/14/2021 Imaging   RIGHT:  - There is no evidence of deep vein  thrombosis in the lower extremity.     - No cystic structure found in the popliteal fossa.     LEFT:  - There is no evidence of deep vein thrombosis in the lower extremity.     - No cystic structure found in the popliteal fossa.    05/14/2021 Imaging   1. Pathologic split type fracture of T4 with approximately 25% height loss, likely due to metastatic disease. 2. No other evidence of metastatic disease of the cervical, thoracic or lumbar spine. 3. Moderate left L5-S1 neural foraminal stenosis. 4. Left-greater-than-right lateral recess narrowing at L4-5 and L5-S1.     05/14/2021 Imaging   1. 5 cm ill-defined irregular low-density lesion in the anterior right liver inferiorly is highly suspicious for primary or metastatic neoplasm. The lesion obliterates bile ducts in the central right liver resulting in right hepatic lobe biliary dilatation. Similarly, portal venous anatomy in the anterior right liver is involved along the superomedial margin of the lesion. Lesion should be amenable to tissue sampling. 2. Gallbladder wall is contracted and irregular in appearance adjacent to the lesion. Imaging features suggest that gallbladder changes are secondary although primary gallbladder neoplasm not excluded. 3. 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 No evidence for additional metastatic disease in the abdomen or pelvis. 4. Prostatomegaly. 5. Trace free fluid in the pelvis. 6. Aortic Atherosclerosis (ICD10-I70.0).   05/15/2021 Pathology Results   Clinical History: Right hepatic tumor and evidence for bone metastasis (crm)    FINAL MICROSCOPIC DIAGNOSIS:   A. LIVER, RIGHT, BIOPSY:  -  Adenocarcinoma  -  See comment   COMMENT:   By immunohistochemistry, the neoplastic cells are positive for cytokeratin 7, monoclonal and polyclonal CEA with patchy positivity for cytokeratin 20.  The tumor cells are negative for TTF-1, AFP, prostein, PSA and CDX2.  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     05/15/2021 Procedure   Ultrasound-guided core biopsies of a right hepatic lesion.   05/16/2021 Imaging   MRI brain  Unremarkable appearance of the brain for age. No evidence of intracranial metastases.     05/16/2021 Imaging   Ct chest  1. Study is once again positive for subsegmental sized emboli in the right lower lobe. No larger central, lobar or segmental sized emboli are noted. 2. Lytic lesion in T4 with pathologic fracture, similar to the prior study. This was better depicted on recent thoracic spine MRI. 3. Aortic atherosclerosis, in addition to left main and 3 vessel coronary artery disease. There is also mild ectasia of the ascending thoracic aorta (4.0 cm in diameter).    05/19/2021 Procedure   Ultrasound and fluoroscopically guided right internal jugular single lumen power port catheter insertion. Tip in the SVC/RA junction. Catheter ready for use.   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 Results of the tumor marker test revealed 1627.   07/17/2021 Tumor Marker   Patient's tumor was tested for the following markers: CA19-9. Results of the tumor marker test revealed 910.   07/30/2021 Imaging   Decreased size of mass lesion with biliary duct dilation the capsular retraction with imaging features of cholangiocarcinoma.   Soft tissue along the capsular margin also with decreased conspicuity.   T4  metastatic lesion with increasing sclerosis, potentially reflecting some healing in the interval of this pathologic fracture and associated metastatic disease.   Aortic atherosclerosis.     08/21/2021 Tumor Marker   Patient's tumor was tested for the following markers: CA-19-9. Results of the tumor marker test revealed 535.     PHYSICAL EXAMINATION: ECOG PERFORMANCE STATUS: 1 - Symptomatic but completely ambulatory  Vitals:   09/18/21 1215  BP: (!) 135/92  Pulse: 92  Resp: 19  Temp: 97.7 F (36.5 C)  SpO2: 99%   Filed Weights   09/18/21 1215  Weight: 149 lb 9.6 oz (67.9 kg)    GENERAL:alert, no distress and comfortable SKIN: skin color, texture, turgor are normal, no rashes or significant lesions EYES: normal, Conjunctiva are pink and non-injected, sclera clear OROPHARYNX:no exudate, no erythema and lips, buccal mucosa, and tongue normal  NECK: supple, thyroid normal size, non-tender, without nodularity LYMPH:  no palpable lymphadenopathy in the cervical, axillary or inguinal LUNGS: clear to auscultation and percussion with normal breathing effort HEART: regular rate & rhythm and no murmurs and no lower extremity edema ABDOMEN:abdomen soft, non-tender and normal bowel sounds Musculoskeletal:no cyanosis of digits and no clubbing  NEURO: alert & oriented x 3 with fluent speech, no focal motor/sensory deficits  LABORATORY DATA:  I have reviewed the data as listed    Component Value Date/Time   NA 143 09/18/2021 1207   NA 145 (H) 04/28/2018 1054   K 4.2 09/18/2021 1207   CL 110 09/18/2021 1207   CO2 24 09/18/2021 1207   GLUCOSE 112 (H) 09/18/2021 1207   BUN 16 09/18/2021 1207   BUN 13 04/28/2018 1054   CREATININE 0.73 09/18/2021 1207   CALCIUM 8.8 (L) 09/18/2021 1207   PROT 6.8 09/18/2021 1207   PROT 6.8 04/28/2018 1054   ALBUMIN 3.7 09/18/2021 1207   ALBUMIN 4.4 04/28/2018 1054   AST 24 09/18/2021 1207   ALT 36 09/18/2021 1207   ALKPHOS 135 (H) 09/18/2021 1207    BILITOT 0.2 (L) 09/18/2021 1207   GFRNONAA >60 09/18/2021 1207   GFRAA >60 08/22/2018 0904    No results found for: SPEP, UPEP  Lab Results  Component Value Date   WBC 3.1 (L) 09/18/2021   NEUTROABS 1.7 09/18/2021   HGB 11.4 (L) 09/18/2021   HCT 33.8 (L) 09/18/2021   MCV 102.1 (H) 09/18/2021   PLT 80 (L) 09/18/2021      Chemistry      Component Value Date/Time   NA 143 09/18/2021 1207   NA 145 (H) 04/28/2018 1054   K 4.2 09/18/2021 1207   CL 110 09/18/2021 1207   CO2 24 09/18/2021 1207   BUN 16 09/18/2021 1207   BUN 13 04/28/2018 1054   CREATININE 0.73 09/18/2021 1207      Component Value Date/Time   CALCIUM 8.8 (L) 09/18/2021 1207   ALKPHOS 135 (H) 09/18/2021 1207   AST 24 09/18/2021 1207   ALT 36 09/18/2021 1207   BILITOT 0.2 (L) 09/18/2021 1207

## 2021-09-18 NOTE — Assessment & Plan Note (Signed)
The plan will be to continue anticoagulation therapy for 1 year He has no bleeding complications

## 2021-09-18 NOTE — Assessment & Plan Note (Signed)
He is not symptomatic He has progressive pancytopenia due to cumulative bone marrow suppression As above, we will delay his treatment by 1 week

## 2021-09-18 NOTE — Telephone Encounter (Signed)
Called and spoke with son regarding missed appts. They had a scheduling mixup and will come in to [facility] as soon as possible. Dr. Alvy Bimler aware.

## 2021-09-18 NOTE — Assessment & Plan Note (Signed)
I think it is prudent to try to reschedule the MRI He does not need pain medicine recently Observe closely

## 2021-09-18 NOTE — Telephone Encounter (Signed)
Returned sons call. Per Dr. Alvy Bimler, keep appts as scheduled, she will look at MRI results before ordering any other scan. Son verbalized understanding.

## 2021-09-18 NOTE — Assessment & Plan Note (Signed)
His last set of imaging studies show excellent response to treatment He is scheduled for MRI but missed his appointment He has significant progressive pancytopenia  I recommend we hold treatment next week and reschedule and delay his treatment by 1 week He is in agreement

## 2021-09-18 NOTE — Telephone Encounter (Signed)
Left message reminder for patient regarding 3:00pm-09/21/21 telephone ONLY appointment w/ Shona Simpson PA-C. I left my extension [phone] for patient to return call before appointment time so that I may complete the "nursing' portion of the appointment. I will attempt to reach patient once more prior to appointment time.

## 2021-09-19 LAB — CANCER ANTIGEN 19-9: CA 19-9: 689 U/mL — ABNORMAL HIGH (ref 0–35)

## 2021-09-21 ENCOUNTER — Ambulatory Visit: Payer: Managed Care, Other (non HMO)

## 2021-09-21 ENCOUNTER — Ambulatory Visit: Payer: Managed Care, Other (non HMO) | Admitting: Radiation Oncology

## 2021-09-23 ENCOUNTER — Telehealth: Payer: Self-pay

## 2021-09-23 NOTE — Telephone Encounter (Signed)
Returned sons call. Requested letters from Dr. Alvy Bimler is available to pick up. Left at the front desk and he will pick up today.

## 2021-09-28 ENCOUNTER — Inpatient Hospital Stay (HOSPITAL_BASED_OUTPATIENT_CLINIC_OR_DEPARTMENT_OTHER): Payer: Managed Care, Other (non HMO) | Admitting: Hematology and Oncology

## 2021-09-28 ENCOUNTER — Encounter: Payer: Self-pay | Admitting: Hematology and Oncology

## 2021-09-28 ENCOUNTER — Other Ambulatory Visit: Payer: Self-pay

## 2021-09-28 ENCOUNTER — Inpatient Hospital Stay: Payer: Managed Care, Other (non HMO)

## 2021-09-28 DIAGNOSIS — C249 Malignant neoplasm of biliary tract, unspecified: Secondary | ICD-10-CM | POA: Diagnosis not present

## 2021-09-28 DIAGNOSIS — D61818 Other pancytopenia: Secondary | ICD-10-CM | POA: Diagnosis not present

## 2021-09-28 DIAGNOSIS — M8458XA Pathological fracture in neoplastic disease, other specified site, initial encounter for fracture: Secondary | ICD-10-CM | POA: Diagnosis not present

## 2021-09-28 DIAGNOSIS — I2699 Other pulmonary embolism without acute cor pulmonale: Secondary | ICD-10-CM | POA: Diagnosis not present

## 2021-09-28 DIAGNOSIS — R7989 Other specified abnormal findings of blood chemistry: Secondary | ICD-10-CM

## 2021-09-28 DIAGNOSIS — Z5111 Encounter for antineoplastic chemotherapy: Secondary | ICD-10-CM | POA: Diagnosis not present

## 2021-09-28 LAB — CMP (CANCER CENTER ONLY)
ALT: 66 U/L — ABNORMAL HIGH (ref 0–44)
AST: 40 U/L (ref 15–41)
Albumin: 3.6 g/dL (ref 3.5–5.0)
Alkaline Phosphatase: 164 U/L — ABNORMAL HIGH (ref 38–126)
Anion gap: 7 (ref 5–15)
BUN: 17 mg/dL (ref 8–23)
CO2: 27 mmol/L (ref 22–32)
Calcium: 8.8 mg/dL — ABNORMAL LOW (ref 8.9–10.3)
Chloride: 110 mmol/L (ref 98–111)
Creatinine: 0.86 mg/dL (ref 0.61–1.24)
GFR, Estimated: >60 mL/min (ref >60)
Glucose, Bld: 111 mg/dL — ABNORMAL HIGH (ref 70–99)
Potassium: 4.2 mmol/L (ref 3.5–5.1)
Sodium: 144 mmol/L (ref 135–145)
Total Bilirubin: 0.3 mg/dL (ref 0.3–1.2)
Total Protein: 6.5 g/dL (ref 6.5–8.1)

## 2021-09-28 LAB — CBC WITH DIFFERENTIAL (CANCER CENTER ONLY)
Abs Immature Granulocytes: 0.01 10*3/uL (ref 0.00–0.07)
Basophils Absolute: 0 10*3/uL (ref 0.0–0.1)
Basophils Relative: 0 %
Eosinophils Absolute: 0.1 10*3/uL (ref 0.0–0.5)
Eosinophils Relative: 2 %
HCT: 37.1 % — ABNORMAL LOW (ref 39.0–52.0)
Hemoglobin: 12.1 g/dL — ABNORMAL LOW (ref 13.0–17.0)
Immature Granulocytes: 0 %
Lymphocytes Relative: 32 %
Lymphs Abs: 1 10*3/uL (ref 0.7–4.0)
MCH: 34.1 pg — ABNORMAL HIGH (ref 26.0–34.0)
MCHC: 32.6 g/dL (ref 30.0–36.0)
MCV: 104.5 fL — ABNORMAL HIGH (ref 80.0–100.0)
Monocytes Absolute: 0.7 10*3/uL (ref 0.1–1.0)
Monocytes Relative: 24 %
Neutro Abs: 1.2 10*3/uL — ABNORMAL LOW (ref 1.7–7.7)
Neutrophils Relative %: 42 %
Platelet Count: 208 10*3/uL (ref 150–400)
RBC: 3.55 MIL/uL — ABNORMAL LOW (ref 4.22–5.81)
RDW: 17 % — ABNORMAL HIGH (ref 11.5–15.5)
WBC Count: 3 10*3/uL — ABNORMAL LOW (ref 4.0–10.5)
nRBC: 0 % (ref 0.0–0.2)

## 2021-09-28 LAB — MAGNESIUM: Magnesium: 2.1 mg/dL (ref 1.7–2.4)

## 2021-09-28 NOTE — Assessment & Plan Note (Signed)
He denies the need to take oxycodone His is scheduled MRI spine to follow

## 2021-09-28 NOTE — Assessment & Plan Note (Signed)
The plan will be to continue anticoagulation therapy for 1 year He has no bleeding complications

## 2021-09-28 NOTE — Progress Notes (Signed)
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[place]) Clinically, he tolerated treatment well except for worsening pancytopenia I will proceed with treatment tomorrow with dose adjustment of gemcitabine He will be due for repeat imaging study sometime next month He is scheduled for MRI of the thoracic spine for review He is not symptomatic from back pain anymore   Pancytopenia, acquired ([place]) He is not symptomatic He has progressive pancytopenia due to cumulative bone marrow suppression His treatment was recently delayed I plan to reduce gemcitabine to 800 mg per metered squared moving forward  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   All questions were answered. The patient knows to call the clinic with any problems, questions or concerns. The total time spent in the appointment was 30 minutes encounter with patients including review of chart and various tests results, discussions about plan of care and coordination of care plan   Heath Lark, MD 09/29/2021 10:18 AM  INTERVAL HISTORY: Please see below for problem oriented charting. he returns for treatment follow-up with his son and [place] interpreter He is doing well Denies peripheral neuropathy, nausea or changes in bowel habits No bleeding complications from anticoagulation therapy He rarely uses oxycodone anymore  REVIEW OF SYSTEMS:   Constitutional: Denies fevers, chills or abnormal weight loss Eyes: Denies blurriness of vision Ears, nose, mouth, throat, and face: Denies  mucositis or sore throat Respiratory: Denies cough, dyspnea or wheezes Cardiovascular: Denies palpitation, chest discomfort or lower extremity swelling Gastrointestinal:  Denies nausea, heartburn or change in bowel habits Skin: Denies abnormal skin rashes Lymphatics: Denies new lymphadenopathy or easy bruising Neurological:Denies numbness, tingling or new weaknesses Behavioral/Psych: Mood is stable, no new changes  All other systems were reviewed with the patient and are negative.  I have reviewed the past medical history, past surgical history, social history and family history with the patient and they are unchanged from previous note.  ALLERGIES:  has No Known Allergies.  MEDICATIONS:  Current Outpatient Medications  Medication Sig Dispense Refill   apixaban (ELIQUIS) 5 MG TABS tablet Take 1 tablet by mouth 2 times daily. 60 tablet 6   lidocaine-prilocaine (EMLA) cream Apply to affected area once 30 g 3   ondansetron (ZOFRAN) 8 MG tablet Take 1 tablet by mouth every 8 hours as needed. 30 tablet 1   oxyCODONE (OXY IR/ROXICODONE) 5 MG immediate release tablet Take 1 tablet (5 mg total) by mouth every 4 hours as needed for severe pain. 60 tablet 0   prochlorperazine (COMPAZINE) 10 MG tablet Take 1 tablet by mouth every 6 hours as needed (Nausea or vomiting). 30 tablet 1   No current facility-administered medications for this visit.   Facility-Administered Medications Ordered in Other Visits  Medication Dose Route Frequency Provider Last Rate Last Admin   CISplatin (PLATINOL) 44 mg in sodium chloride 0.9 % 250 mL chemo infusion  25 mg/m2 (Treatment Plan Recorded) Intravenous Once Alvy Bimler, [place]ene [place], MD       dexamethasone (DECADRON) 10 mg in sodium chloride 0.9 % 50 mL IVPB  10 mg Intravenous Once  Heath Lark, MD       famotidine (PEPCID) IVPB 20 mg in NS 100 mL IVPB  20 mg Intravenous Once Alvy Bimler, Allyse Fregeau, MD       fosaprepitant (EMEND) 150 mg in sodium chloride 0.9 % 145 mL IVPB  150 mg  Intravenous Once Alvy Bimler, Alayshia Marini, MD       gemcitabine (GEMZAR) 1,406 mg in sodium chloride 0.9 % 250 mL chemo infusion  800 mg/m2 (Treatment Plan Recorded) Intravenous Once Alvy Bimler, Demarea Lorey, MD       heparin lock flush 100 unit/mL  500 Units Intracatheter Once PRN Alvy Bimler, Elmond Poehlman, MD       palonosetron (ALOXI) injection 0.25 mg  0.25 mg Intravenous Once Cohen Doleman, MD       sodium chloride flush (NS) 0.9 % injection 10 mL  10 mL Intracatheter PRN Heath Lark, MD        SUMMARY OF ONCOLOGIC HISTORY: Oncology History  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 He underwent liver biopsy   05/14/2021 Imaging   RIGHT:  - There is no evidence of deep vein thrombosis in the lower extremity.     - No cystic structure found in the popliteal fossa.     LEFT:  - There is no evidence of deep vein thrombosis in the lower extremity.     - No cystic structure found in the popliteal fossa.    05/14/2021 Imaging   1. Pathologic split type fracture of T4 with approximately 25% height loss, likely due to metastatic disease. 2. No other evidence of metastatic disease of the cervical, thoracic or lumbar spine. 3. Moderate left L5-S1 neural foraminal stenosis. 4. Left-greater-than-right lateral recess narrowing at L4-5 and L5-S1.     05/14/2021 Imaging   1. 5 cm ill-defined irregular low-density lesion in the anterior right liver inferiorly is highly suspicious for primary or metastatic neoplasm. The lesion obliterates bile ducts in the central right liver resulting in right hepatic lobe biliary dilatation. Similarly, portal venous anatomy in the anterior right liver is involved along the superomedial margin of the lesion. Lesion should be amenable to tissue sampling. 2. Gallbladder wall is contracted and irregular in appearance adjacent to the lesion. Imaging features suggest that gallbladder changes are  secondary although primary gallbladder neoplasm not excluded. 3. 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 No evidence for additional metastatic disease in the abdomen or pelvis. 4. Prostatomegaly. 5. Trace free fluid in the pelvis. 6. Aortic Atherosclerosis (ICD10-I70.0).   05/15/2021 Pathology Results   Clinical History: Right hepatic tumor and evidence for bone metastasis (crm)    FINAL MICROSCOPIC DIAGNOSIS:   A. LIVER, RIGHT, BIOPSY:  -  Adenocarcinoma  -  See comment   COMMENT:   By immunohistochemistry, the neoplastic cells are positive for cytokeratin 7, monoclonal and polyclonal CEA with patchy positivity for cytokeratin 20.  The tumor cells are negative for TTF-1, AFP, prostein, PSA and CDX2.  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     05/15/2021 Procedure   Ultrasound-guided core biopsies of a right hepatic lesion.   05/16/2021 Imaging   MRI brain  Unremarkable appearance of the brain for age. No evidence of intracranial metastases.     05/16/2021 Imaging   Ct chest  1. Study is once again positive for subsegmental sized emboli in  the right lower lobe. No larger central, lobar or segmental sized emboli are noted. 2. Lytic lesion in T4 with pathologic fracture, similar to the prior study. This was better depicted on recent thoracic spine MRI. 3. Aortic atherosclerosis, in addition to left main and 3 vessel coronary artery disease. There is also mild ectasia of the ascending thoracic aorta (4.0 cm in diameter).    05/19/2021 Procedure   Ultrasound and fluoroscopically guided right internal jugular single lumen power port catheter insertion. Tip in the SVC/RA junction. Catheter ready for use.   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 Results of the tumor marker test revealed 1627.   07/17/2021 Tumor Marker   Patient's tumor was tested for the following markers: CA19-9. Results of the tumor marker test revealed 910.   07/30/2021 Imaging   Decreased size of mass lesion with biliary duct dilation the capsular retraction with imaging features of cholangiocarcinoma.   Soft tissue along the capsular margin also with decreased conspicuity.   T4 metastatic lesion with increasing sclerosis, potentially reflecting some healing in the interval of this pathologic fracture and associated metastatic disease.   Aortic atherosclerosis.     08/21/2021 Tumor Marker   Patient's tumor was tested for the following markers: CA-19-9. Results of the tumor marker test revealed 535.     PHYSICAL EXAMINATION: ECOG PERFORMANCE STATUS: 1 - Symptomatic but completely ambulatory  Vitals:   09/28/21 1445  BP: (!) 153/90  Pulse: 90  Resp: 18  Temp: 98.5 F (36.9 C)  SpO2: 98%   Filed Weights   09/28/21 1445  Weight: 152 lb 3.2 oz (69 kg)    GENERAL:alert, no distress and comfortable SKIN: skin color, texture, turgor are normal, no rashes or significant lesions EYES: normal, Conjunctiva are pink and non-injected, sclera clear OROPHARYNX:no exudate, no erythema and lips, buccal mucosa, and tongue normal  NECK: supple, thyroid normal size, non-tender, without nodularity LYMPH:  no palpable lymphadenopathy in the cervical, axillary or inguinal LUNGS: clear to auscultation and percussion with normal breathing effort HEART: regular rate & rhythm and no murmurs and no lower extremity  edema ABDOMEN:abdomen soft, non-tender and normal bowel sounds Musculoskeletal:no cyanosis of digits and no clubbing  NEURO: alert & oriented x 3 with fluent speech, no focal motor/sensory deficits  LABORATORY DATA:  I have reviewed the data as listed    Component Value Date/Time   NA 144 09/28/2021 1431   NA 145 (H) 04/28/2018 1054   K 4.2 09/28/2021 1431   CL 110 09/28/2021 1431   CO2 27 09/28/2021 1431   GLUCOSE 111 (H) 09/28/2021 1431   BUN 17 09/28/2021 1431   BUN 13 04/28/2018 1054   CREATININE 0.86 09/28/2021 1431   CALCIUM 8.8 (L) 09/28/2021 1431   PROT 6.5 09/28/2021 1431   PROT 6.8 04/28/2018 1054   ALBUMIN 3.6 09/28/2021 1431   ALBUMIN 4.4 04/28/2018 1054   AST 40 09/28/2021 1431   ALT 66 (H) 09/28/2021 1431   ALKPHOS 164 (H) 09/28/2021 1431   BILITOT 0.3 09/28/2021  [place] 09/28/2021 1431   GFRAA >60 08/22/2018 0904    No results found for: SPEP, UPEP  Lab Results  Component Value Date   WBC 3.0 (L) 09/28/2021   NEUTROABS 1.2 (L) 09/28/2021   HGB 12.1 (L) 09/28/2021   HCT 37.1 (L) 09/28/2021   MCV 104.5 (H) 09/28/2021   PLT 208 09/28/2021      Chemistry      Component Value Date/Time   NA 144 09/28/2021 1431   NA 145 (H) 04/28/2018 1054   K 4.2 09/28/2021 1431   CL 110 09/28/2021 1431   CO2 27 09/28/2021 1431   BUN 17 09/28/2021 1431   BUN 13 04/28/2018 1054   CREATININE 0.86 09/28/2021 1431      Component Value Date/Time   CALCIUM 8.8 (L) 09/28/2021 1431   ALKPHOS 164 (H) 09/28/2021 1431   AST 40 09/28/2021 1431   ALT 66 (H) 09/28/2021 1431   BILITOT 0.3 09/28/2021 1431

## 2021-09-28 NOTE — Assessment & Plan Note (Signed)
He is not symptomatic He has progressive pancytopenia due to cumulative bone marrow suppression His treatment was recently delayed I plan to reduce gemcitabine to 800 mg per metered squared moving forward

## 2021-09-28 NOTE — Assessment & Plan Note (Signed)
Clinically, he tolerated treatment well except for worsening pancytopenia I will proceed with treatment tomorrow with dose adjustment of gemcitabine He will be due for repeat imaging study sometime next month He is scheduled for MRI of the thoracic spine for review He is not symptomatic from back pain anymore

## 2021-09-29 ENCOUNTER — Inpatient Hospital Stay: Payer: Managed Care, Other (non HMO)

## 2021-09-29 ENCOUNTER — Encounter: Payer: Self-pay | Admitting: Hematology and Oncology

## 2021-09-29 VITALS — BP 141/78 | HR 73 | Temp 98.4°F | Resp 16 | Wt 152.1 lb

## 2021-09-29 DIAGNOSIS — Z5111 Encounter for antineoplastic chemotherapy: Secondary | ICD-10-CM | POA: Diagnosis not present

## 2021-09-29 DIAGNOSIS — C249 Malignant neoplasm of biliary tract, unspecified: Secondary | ICD-10-CM

## 2021-09-29 MED ORDER — SODIUM CHLORIDE 0.9 % IV SOLN
25.0000 mg/m2 | Freq: Once | INTRAVENOUS | Status: AC
  Administered 2021-09-29: 13:00:00 44 mg via INTRAVENOUS
  Filled 2021-09-29: qty 44

## 2021-09-29 MED ORDER — FAMOTIDINE 20 MG IN NS 100 ML IVPB
20.0000 mg | Freq: Once | INTRAVENOUS | Status: AC
  Administered 2021-09-29: 11:00:00 20 mg via INTRAVENOUS
  Filled 2021-09-29: qty 100

## 2021-09-29 MED ORDER — MAGNESIUM SULFATE 2 GM/50ML IV SOLN
2.0000 g | Freq: Once | INTRAVENOUS | Status: AC
  Administered 2021-09-29: 09:00:00 2 g via INTRAVENOUS
  Filled 2021-09-29: qty 50

## 2021-09-29 MED ORDER — HEPARIN SOD (PORK) LOCK FLUSH 100 UNIT/ML IV SOLN
500.0000 [IU] | Freq: Once | INTRAVENOUS | Status: AC | PRN
  Administered 2021-09-29: 16:00:00 500 [IU]

## 2021-09-29 MED ORDER — SODIUM CHLORIDE 0.9 % IV SOLN
150.0000 mg | Freq: Once | INTRAVENOUS | Status: AC
  Administered 2021-09-29: 11:00:00 150 mg via INTRAVENOUS
  Filled 2021-09-29: qty 150
  Filled 2021-09-29: qty 5

## 2021-09-29 MED ORDER — SODIUM CHLORIDE 0.9 % IV SOLN
800.0000 mg/m2 | Freq: Once | INTRAVENOUS | Status: AC
  Administered 2021-09-29: 12:00:00 1406 mg via INTRAVENOUS
  Filled 2021-09-29: qty 36.98

## 2021-09-29 MED ORDER — SODIUM CHLORIDE 0.9 % IV SOLN: Freq: Once | INTRAVENOUS | Status: AC

## 2021-09-29 MED ORDER — SODIUM CHLORIDE 0.9% FLUSH
10.0000 mL | INTRAVENOUS | Status: DC | PRN
  Administered 2021-09-29: 16:00:00 10 mL

## 2021-09-29 MED ORDER — PALONOSETRON HCL INJECTION 0.25 MG/5ML
0.2500 mg | Freq: Once | INTRAVENOUS | Status: AC
  Administered 2021-09-29: 12:00:00 0.25 mg via INTRAVENOUS
  Filled 2021-09-29: qty 5

## 2021-09-29 MED ORDER — POTASSIUM CHLORIDE IN NACL 20-0.9 MEQ/L-% IV SOLN
Freq: Once | INTRAVENOUS | Status: AC
  Filled 2021-09-29: qty 1000

## 2021-09-29 MED ORDER — SODIUM CHLORIDE 0.9 % IV SOLN
10.0000 mg | Freq: Once | INTRAVENOUS | Status: AC
  Administered 2021-09-29: 11:00:00 10 mg via INTRAVENOUS
  Filled 2021-09-29: qty 10
  Filled 2021-09-29: qty 1

## 2021-09-29 NOTE — Patient Instructions (Signed)
[facility]  Discharge Instructions: Thank you for choosing [facility] to provide your oncology and hematology care.   If you have a lab appointment with the [facility], please go directly to the [facility] and check in at the registration area.   Wear comfortable clothing and clothing appropriate for easy access to any Portacath or PICC line.   We strive to give you quality time with your provider. You may need to reschedule your appointment if you arrive late (15 or more minutes).  Arriving late affects you and other patients whose appointments are after yours.  Also, if you miss three or more appointments without notifying the office, you may be dismissed from the clinic at the providers discretion.      For prescription refill requests, have your pharmacy contact our office and allow 72 hours for refills to be completed.    Today you received the following chemotherapy, gemzar, cisplatin.   To help prevent nausea and vomiting after your treatment, we encourage you to take your nausea medication as directed.  BELOW ARE SYMPTOMS THAT SHOULD BE REPORTED IMMEDIATELY: *FEVER GREATER THAN 100.4 F (38 C) OR HIGHER *CHILLS OR SWEATING *NAUSEA AND VOMITING THAT IS NOT CONTROLLED WITH YOUR NAUSEA MEDICATION *UNUSUAL SHORTNESS OF BREATH *UNUSUAL BRUISING OR BLEEDING *URINARY PROBLEMS (pain or burning when urinating, or frequent urination) *BOWEL PROBLEMS (unusual diarrhea, constipation, pain near the anus) TENDERNESS IN MOUTH AND THROAT WITH OR WITHOUT PRESENCE OF ULCERS (sore throat, sores in mouth, or a toothache) UNUSUAL RASH, SWELLING OR PAIN  UNUSUAL VAGINAL DISCHARGE OR ITCHING   Items with * indicate a potential emergency and should be followed up as soon as possible or go to the Emergency Department if any problems should occur.  Please show the CHEMOTHERAPY ALERT CARD or IMMUNOTHERAPY ALERT CARD at check-in to the Emergency  Department and triage nurse.  Should you have questions after your visit or need to cancel or reschedule your appointment, please contact [facility]  Dept: [phone]  and follow the prompts.  Office hours are 8:00 a.m. to 4:30 p.m. Monday - Friday. Please note that voicemails left after 4:00 p.m. may not be returned until the following business day.  We are closed weekends and major holidays. You have access to a nurse at all times for urgent questions. Please call the main number to the clinic Dept: [phone] and follow the prompts.   For any non-urgent questions, you may also contact your provider using MyChart. We now offer e-Visits for anyone 74 and older to request care online for non-urgent symptoms. For details visit [email or web address].   Also download the MyChart app! Go to the app store, search "MyChart", open the app, select [facility], and log in with your MyChart username and password.  Due to Covid, a mask is required upon entering the hospital/clinic. If you do not have a mask, one will be given to you upon arrival. For doctor visits, patients may have 1 support person aged 62 or older with them. For treatment visits, patients cannot have anyone with them due to current Covid guidelines and our immunocompromised population.

## 2021-09-29 NOTE — Progress Notes (Signed)
[employer] [place] interpreter used today. Patient saw MD yesterday, denies any changes since yesterday.  Will proceed with treatment today per MD despite Rison is low.  Voided 200 mls of urine. 1116  Treatment given per orders. Patient tolerated it well without problems. Vitals stable and discharged home from clinic ambulatory. Follow up as scheduled.

## 2021-09-29 NOTE — Assessment & Plan Note (Signed)
Overall, this is stable We will proceed with treatment

## 2021-09-30 ENCOUNTER — Other Ambulatory Visit: Payer: Managed Care, Other (non HMO)

## 2021-09-30 ENCOUNTER — Telehealth: Payer: Self-pay

## 2021-09-30 NOTE — Telephone Encounter (Signed)
Received call from pt son regarding MRI appointment today 09/30/21. Due to no interpreter available, pt scan has been rescheduled for 10/14/2021 at [facility].

## 2021-10-05 ENCOUNTER — Other Ambulatory Visit: Payer: Managed Care, Other (non HMO)

## 2021-10-14 ENCOUNTER — Other Ambulatory Visit: Payer: Self-pay

## 2021-10-14 ENCOUNTER — Ambulatory Visit
Admission: RE | Admit: 2021-10-14 | Discharge: 2021-10-14 | Disposition: A | Discharge status: Home / Self Care | Payer: Managed Care, Other (non HMO) | Source: Ambulatory Visit | Attending: Radiation Oncology | Admitting: Radiation Oncology

## 2021-10-14 DIAGNOSIS — C7951 Secondary malignant neoplasm of bone: Secondary | ICD-10-CM

## 2021-10-14 MED ORDER — GADOBENATE DIMEGLUMINE 529 MG/ML IV SOLN
13.0000 mL | Freq: Once | INTRAVENOUS | Status: AC | PRN
  Administered 2021-10-14: 13 mL via INTRAVENOUS

## 2021-10-16 ENCOUNTER — Inpatient Hospital Stay: Payer: Managed Care, Other (non HMO) | Attending: Hematology and Oncology

## 2021-10-16 ENCOUNTER — Other Ambulatory Visit (HOSPITAL_COMMUNITY): Payer: Self-pay

## 2021-10-16 ENCOUNTER — Inpatient Hospital Stay (HOSPITAL_BASED_OUTPATIENT_CLINIC_OR_DEPARTMENT_OTHER): Payer: Managed Care, Other (non HMO) | Admitting: Hematology and Oncology

## 2021-10-16 ENCOUNTER — Other Ambulatory Visit: Payer: Self-pay

## 2021-10-16 ENCOUNTER — Other Ambulatory Visit: Payer: Self-pay | Admitting: Hematology and Oncology

## 2021-10-16 ENCOUNTER — Encounter: Payer: Self-pay | Admitting: Hematology and Oncology

## 2021-10-16 VITALS — BP 145/90 | HR 90 | Temp 98.2°F | Resp 18 | Ht 62.0 in | Wt 155.2 lb

## 2021-10-16 DIAGNOSIS — Z86711 Personal history of pulmonary embolism: Secondary | ICD-10-CM | POA: Insufficient documentation

## 2021-10-16 DIAGNOSIS — K5909 Other constipation: Secondary | ICD-10-CM

## 2021-10-16 DIAGNOSIS — Z5112 Encounter for antineoplastic immunotherapy: Secondary | ICD-10-CM | POA: Insufficient documentation

## 2021-10-16 DIAGNOSIS — C249 Malignant neoplasm of biliary tract, unspecified: Secondary | ICD-10-CM

## 2021-10-16 DIAGNOSIS — M8458XA Pathological fracture in neoplastic disease, other specified site, initial encounter for fracture: Secondary | ICD-10-CM

## 2021-10-16 DIAGNOSIS — Z599 Problem related to housing and economic circumstances, unspecified: Secondary | ICD-10-CM

## 2021-10-16 DIAGNOSIS — C221 Intrahepatic bile duct carcinoma: Secondary | ICD-10-CM | POA: Insufficient documentation

## 2021-10-16 DIAGNOSIS — C7951 Secondary malignant neoplasm of bone: Secondary | ICD-10-CM | POA: Diagnosis not present

## 2021-10-16 DIAGNOSIS — Z79899 Other long term (current) drug therapy: Secondary | ICD-10-CM | POA: Insufficient documentation

## 2021-10-16 DIAGNOSIS — I2699 Other pulmonary embolism without acute cor pulmonale: Secondary | ICD-10-CM

## 2021-10-16 DIAGNOSIS — N400 Enlarged prostate without lower urinary tract symptoms: Secondary | ICD-10-CM | POA: Insufficient documentation

## 2021-10-16 DIAGNOSIS — Z5111 Encounter for antineoplastic chemotherapy: Secondary | ICD-10-CM | POA: Insufficient documentation

## 2021-10-16 DIAGNOSIS — Z7901 Long term (current) use of anticoagulants: Secondary | ICD-10-CM | POA: Diagnosis not present

## 2021-10-16 LAB — CBC WITH DIFFERENTIAL (CANCER CENTER ONLY)
Abs Immature Granulocytes: 0.01 10*3/uL (ref 0.00–0.07)
Basophils Absolute: 0 10*3/uL (ref 0.0–0.1)
Basophils Relative: 0 %
Eosinophils Absolute: 0 10*3/uL (ref 0.0–0.5)
Eosinophils Relative: 0 %
HCT: 38.5 % — ABNORMAL LOW (ref 39.0–52.0)
Hemoglobin: 12.9 g/dL — ABNORMAL LOW (ref 13.0–17.0)
Immature Granulocytes: 0 %
Lymphocytes Relative: 22 %
Lymphs Abs: 1 10*3/uL (ref 0.7–4.0)
MCH: 34.1 pg — ABNORMAL HIGH (ref 26.0–34.0)
MCHC: 33.5 g/dL (ref 30.0–36.0)
MCV: 101.9 fL — ABNORMAL HIGH (ref 80.0–100.0)
Monocytes Absolute: 0.7 10*3/uL (ref 0.1–1.0)
Monocytes Relative: 16 %
Neutro Abs: 2.7 10*3/uL (ref 1.7–7.7)
Neutrophils Relative %: 62 %
Platelet Count: 220 10*3/uL (ref 150–400)
RBC: 3.78 MIL/uL — ABNORMAL LOW (ref 4.22–5.81)
RDW: 16 % — ABNORMAL HIGH (ref 11.5–15.5)
WBC Count: 4.5 10*3/uL (ref 4.0–10.5)
nRBC: 0 % (ref 0.0–0.2)

## 2021-10-16 LAB — CMP (CANCER CENTER ONLY)
ALT: 48 U/L — ABNORMAL HIGH (ref 0–44)
AST: 36 U/L (ref 15–41)
Albumin: 4.2 g/dL (ref 3.5–5.0)
Alkaline Phosphatase: 249 U/L — ABNORMAL HIGH (ref 38–126)
Anion gap: 7 (ref 5–15)
BUN: 13 mg/dL (ref 8–23)
CO2: 27 mmol/L (ref 22–32)
Calcium: 9.5 mg/dL (ref 8.9–10.3)
Chloride: 105 mmol/L (ref 98–111)
Creatinine: 0.75 mg/dL (ref 0.61–1.24)
GFR, Estimated: >60 mL/min (ref >60)
Glucose, Bld: 101 mg/dL — ABNORMAL HIGH (ref 70–99)
Potassium: 4.4 mmol/L (ref 3.5–5.1)
Sodium: 139 mmol/L (ref 135–145)
Total Bilirubin: 0.3 mg/dL (ref 0.3–1.2)
Total Protein: 7.2 g/dL (ref 6.5–8.1)

## 2021-10-16 LAB — MAGNESIUM: Magnesium: 2.4 mg/dL (ref 1.7–2.4)

## 2021-10-16 LAB — TSH: TSH: 1.295 u[IU]/mL (ref 0.320–4.118)

## 2021-10-16 LAB — T4, FREE: Free T4: 0.62 ng/dL (ref 0.61–1.12)

## 2021-10-16 MED ORDER — LIDOCAINE-PRILOCAINE 2.5-2.5 % EX CREA
TOPICAL_CREAM | CUTANEOUS | 3 refills | Status: DC
  Filled 2021-10-16: qty 30, 30d supply, fill #0

## 2021-10-16 MED FILL — Fosaprepitant Dimeglumine For IV Infusion 150 MG (Base Eq): INTRAVENOUS | Qty: 5 | Status: AC

## 2021-10-16 MED FILL — Dexamethasone Sodium Phosphate Inj 100 MG/10ML: INTRAMUSCULAR | Qty: 1 | Status: AC

## 2021-10-16 NOTE — Assessment & Plan Note (Signed)
The plan will be to continue anticoagulation therapy for 1 year He has no bleeding complications

## 2021-10-16 NOTE — Progress Notes (Signed)
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[facility]) I have reviewed his MRI This was compared with his baseline imaging from the hospital before treatment was started He is completely asymptomatic Recommend calcium with vitamin D supplement  Pulmonary embolism (H[facility]) The plan will be to continue anticoagulation therapy for 1 year He has no bleeding complications  Financial difficulties I have a long discussion with the patient and his son We reviewed his Medicaid application His application has not been approved yet The patient has lost insurance since end of last month I will get help and recommendation from financial assistants to help pay for his treatment  Orders Placed This Encounter  Procedures   CT CHEST ABDOMEN PELVIS W CONTRAST    Standing Status:   Future    Standing Expiration Date:   10/16/2022    Order Specific Question:   Preferred imaging location?    Answer:   [facility]    Order Specific Question:   Radiology Contrast Protocol - do NOT remove file path    Answer:   [email or web address]    All questions were answered. The patient knows to call the clinic with any problems, questions or concerns. The total time spent in the appointment was 30 minutes encounter with patients including review of chart and various tests results, discussions about plan of care and coordination of care plan   Jason Lark, MD 10/16/2021 2:39 PM  INTERVAL HISTORY: Please see below for problem oriented charting. he returns for treatment follow-up with his son He has been receiving gemcitabine, cisplatin and Imfinzi for biliary tract cancer He had recent MRI of his back The patient  is currently uninsured and his son brought several different application/paperwork from Medicaid to show me He have no side effects from treatment so far No recent bleeding He denies back pain  REVIEW OF SYSTEMS:   Constitutional: Denies fevers, chills or abnormal weight loss Eyes: Denies blurriness of vision Ears, nose, mouth, throat, and face: Denies mucositis or sore throat Respiratory: Denies cough, dyspnea or wheezes Cardiovascular: Denies palpitation, chest discomfort or lower extremity swelling Gastrointestinal:  Denies nausea, heartburn or change in bowel habits Skin: Denies abnormal skin rashes Lymphatics: Denies new lymphadenopathy or easy bruising Neurological:Denies numbness, tingling or new weaknesses Behavioral/Psych: Mood is stable, no new changes  All other systems were reviewed with the patient and are negative.  I have reviewed the past medical history, past surgical history, social history and family history with the patient and they are unchanged from previous note.  ALLERGIES:  has No Known Allergies.  MEDICATIONS:  Current Outpatient Medications  Medication Sig Dispense Refill   apixaban (ELIQUIS) 5 MG TABS tablet Take 1 tablet by mouth 2 times daily. 60 tablet 6   lidocaine-prilocaine (EMLA) cream Apply to affected area once daily 30 g 3   ondansetron (ZOFRAN) 8 MG tablet Take 1 tablet by mouth every 8 hours as needed. 30 tablet 1   oxyCODONE (OXY IR/ROXICODONE) 5 MG immediate release tablet Take 1 tablet (5 mg total) by mouth every 4 hours as needed for severe pain. 60 tablet 0   prochlorperazine (COMPAZINE) 10 MG tablet Take 1 tablet by mouth every 6 hours as needed (Nausea or vomiting). 30 tablet  1   No current facility-administered medications for this visit.    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 He underwent liver biopsy   05/14/2021 Imaging   RIGHT:  - There is no evidence of deep vein thrombosis in the lower extremity.     - No cystic structure found in the popliteal fossa.     LEFT:  - There is no evidence of deep vein thrombosis in the lower extremity.     - No cystic structure found in the popliteal fossa.    05/14/2021 Imaging   1. Pathologic split type fracture of T4 with approximately 25% height loss, likely due to metastatic disease. 2. No other evidence of metastatic disease of the cervical, thoracic or lumbar spine. 3. Moderate left L5-S1 neural foraminal stenosis. 4. Left-greater-than-right lateral recess narrowing at L4-5 and L5-S1.     05/14/2021 Imaging   1. 5 cm ill-defined irregular low-density lesion in the anterior right liver inferiorly is highly suspicious for primary or metastatic neoplasm. The lesion obliterates bile ducts in the central right liver resulting in right hepatic lobe biliary dilatation. Similarly, portal venous anatomy in the anterior right liver is involved along the superomedial margin of the lesion. Lesion should be amenable to tissue sampling. 2. Gallbladder wall is contracted and irregular in appearance adjacent to the lesion. Imaging features suggest that gallbladder changes are secondary although primary gallbladder neoplasm not excluded. 3. 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 No evidence for additional metastatic disease in the abdomen or pelvis. 4. Prostatomegaly. 5. Trace free fluid in the pelvis. 6. Aortic Atherosclerosis (ICD10-I70.0).   05/15/2021 Pathology Results   Clinical History: Right hepatic tumor and evidence for bone metastasis ([facility])    FINAL MICROSCOPIC DIAGNOSIS:   A. LIVER, RIGHT, BIOPSY:  -  Adenocarcinoma  -  See comment   COMMENT:   By immunohistochemistry, the neoplastic cells are positive for cytokeratin 7, monoclonal and polyclonal CEA with patchy positivity for cytokeratin  20.  The tumor cells are negative for TTF-1, AFP, prostein, PSA and CDX2.  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     05/15/2021 Procedure   Ultrasound-guided core biopsies of a right hepatic lesion.   05/16/2021 Imaging   MRI brain  Unremarkable appearance of the brain for age. No evidence of intracranial metastases.     05/16/2021 Imaging   Ct chest  1. Study is once again positive for subsegmental sized emboli in the right lower lobe. No larger central, lobar or segmental sized emboli are noted. 2. Lytic lesion in T4 with pathologic fracture, similar to the prior study. This was better depicted on recent thoracic spine MRI. 3. Aortic atherosclerosis, in addition to left main and 3 vessel coronary artery disease. There is also mild ectasia of the ascending thoracic aorta (4.0 cm in diameter).    05/19/2021 Procedure   Ultrasound and fluoroscopically guided right internal jugular single lumen power port catheter insertion. Tip in the SVC/RA junction. Catheter ready for use.   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 Results of the tumor marker test revealed 1627.   07/17/2021 Tumor Marker   Patient's tumor was tested for the following markers: CA19-9. Results of the tumor marker test revealed 910.   07/30/2021 Imaging   Decreased size of mass lesion with biliary duct dilation the capsular retraction with imaging features of  cholangiocarcinoma.   Soft tissue along the capsular margin also with decreased conspicuity.   T4 metastatic lesion with increasing sclerosis, potentially reflecting some healing in the interval of this pathologic fracture and associated metastatic disease.   Aortic atherosclerosis.     08/21/2021 Tumor Marker   Patient's tumor was tested for the following markers: CA-19-9. Results of the tumor marker test revealed 535.   10/14/2021 Imaging   IMPRESSION: 1. Mild interval progression of T4 of pathologic vertebral body collapse now with approximately 55% loss of vertebral body height. 2. New T3 inferior endplate fracture and T5 superior endplate fracture since prior MRI.     PHYSICAL EXAMINATION: ECOG PERFORMANCE STATUS: 0 - Asymptomatic  Vitals:   10/16/21 1353  BP: (!) 145/90  Pulse: 90  Resp: 18  Temp: 98.2 F (36.8 C)  SpO2: 97%   Filed Weights   10/16/21 1353  Weight: 155 lb 3.2 oz (70.4 kg)    GENERAL:alert, no distress and comfortable NEURO: alert & oriented x 3 with fluent speech, no focal motor/sensory deficits  LABORATORY DATA:  I have reviewed the data as listed    Component Value Date/Time   NA 139 10/16/2021 1340   NA 145 (H) 04/28/2018 1054   K 4.4 10/16/2021 1340   CL 105 10/16/2021 1340   CO2 27 10/16/2021 1340   GLUCOSE 101 (H) 10/16/2021 1340   BUN 13 10/16/2021 1340   BUN 13 04/28/2018 1054   CREATININE 0.75 10/16/2021 1340   CALCIUM 9.5 10/16/2021 1340   PROT 7.2 10/16/2021 1340   PROT 6.8 04/28/2018 1054   ALBUMIN 4.2 10/16/2021 1340   ALBUMIN 4.4 04/28/2018 1054   AST 36 10/16/2021 1340   ALT 48 (H) 10/16/2021 1340   ALKPHOS 249 (H) 10/16/2021 1340   BILITOT 0.3 10/16/2021 1340   GFRNONAA >60 10/16/2021 1340   GFRAA >60 08/22/2018 0904    No results found for: SPEP, UPEP  Lab Results  Component Value Date   WBC 4.5 10/16/2021   NEUTROABS 2.7 10/16/2021   HGB 12.9 (L) 10/16/2021   HCT 38.5 (L) 10/16/2021   MCV 101.9 (H)  10/16/2021   PLT 220 10/16/2021      Chemistry      Component Value Date/Time   NA 139 10/16/2021 1340   NA 145 (H) 04/28/2018 1054   K 4.4 10/16/2021 1340   CL 105 10/16/2021 1340   CO2 27 10/16/2021 1340   BUN 13 10/16/2021 1340   BUN 13 04/28/2018 1054   CREATININE 0.75 10/16/2021 1340      Component Value Date/Time   CALCIUM 9.5 10/16/2021 1340   ALKPHOS 249 (H) 10/16/2021 1340   AST 36 10/16/2021 1340   ALT 48 (H) 10/16/2021 1340   BILITOT 0.3 10/16/2021 1340       RADIOGRAPHIC STUDIES: I have personally reviewed the radiological images as listed and agreed with the findings in the report. 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 Metastatic disease evaluation; SRS Treatment planning Spine scan. EXAM: MRI THORACIC WITHOUT AND WITH CONTRAST TECHNIQUE: Multiplanar and multiecho pulse sequences of the thoracic spine were obtained without and with intravenous contrast. CONTRAST:  74mL MULTIHANCE GADOBENATE DIMEGLUMINE 529 MG/ML IV SOLN COMPARISON:  MRI of the thoracic spinal June 03, 2021. FINDINGS: Alignment:  Normal anteroposterior alignment. Vertebrae: Redemonstrated pathologic fracture of the T4 vertebral body resulting in approximately 55% vertebral body height loss. The vertebral body is mildly expanded with extension of tumor into the bilateral pedicles and mild retropulsion resulting in mild spinal canal stenosis. There is new fracture of the inferior endplate with associated marrow edema and approximately 15% loss of vertebral body height. Minimal compression fracture of the superior endplate of T5 is also seen. Cord:  Normal signal and morphology. Paraspinal and other soft tissues: A 1.1 cm T2 hyperintense right hepatic lesion and biliary dilatation are better evaluated on CT of the abdomen performed July 29, 2021. Disc levels: Mild spinal canal stenosis, mild right and moderate left neural  foraminal narrowing at C4-5 related to the T4 vertebral body above described lesion. No significant disc bulge or herniation at any level. No significant spinal canal or neural foraminal stenosis in the remainder thoracic spine. IMPRESSION: 1. Mild interval progression of T4 of pathologic vertebral body collapse now with approximately 55% loss of vertebral body height. 2. New T3 inferior endplate fracture and T5 superior endplate fracture since prior MRI. Electronically Signed   By: Pedro Earls M.D.   On: 10/14/2021 16:22

## 2021-10-16 NOTE — Assessment & Plan Note (Signed)
I have reviewed his MRI This was compared with his baseline imaging from the hospital before treatment was started He is completely asymptomatic Recommend calcium with vitamin D supplement

## 2021-10-16 NOTE — Assessment & Plan Note (Signed)
From the chemotherapy perspective, he tolerated treatment well I plan to order CT imaging in 2 weeks for further follow-up

## 2021-10-16 NOTE — Assessment & Plan Note (Signed)
I have a long discussion with the patient and his son We reviewed his Medicaid application His application has not been approved yet The patient has lost insurance since end of last month I will get help and recommendation from financial assistants to help pay for his treatment

## 2021-10-17 LAB — CANCER ANTIGEN 19-9: CA 19-9: 1115 U/mL — ABNORMAL HIGH (ref 0–35)

## 2021-10-19 ENCOUNTER — Inpatient Hospital Stay: Payer: Managed Care, Other (non HMO)

## 2021-10-19 ENCOUNTER — Telehealth: Payer: Self-pay

## 2021-10-19 ENCOUNTER — Encounter: Payer: Self-pay | Admitting: Radiation Oncology

## 2021-10-19 ENCOUNTER — Ambulatory Visit: Payer: Managed Care, Other (non HMO)

## 2021-10-19 ENCOUNTER — Ambulatory Visit
Admission: RE | Admit: 2021-10-19 | Discharge: 2021-10-19 | Disposition: A | Discharge status: Home / Self Care | Payer: Managed Care, Other (non HMO) | Source: Ambulatory Visit | Attending: Radiation Oncology | Admitting: Radiation Oncology

## 2021-10-19 ENCOUNTER — Other Ambulatory Visit: Payer: Self-pay

## 2021-10-19 VITALS — BP 151/88 | HR 84 | Temp 98.5°F | Resp 16 | Wt 155.8 lb

## 2021-10-19 DIAGNOSIS — C249 Malignant neoplasm of biliary tract, unspecified: Secondary | ICD-10-CM

## 2021-10-19 DIAGNOSIS — Z5112 Encounter for antineoplastic immunotherapy: Secondary | ICD-10-CM | POA: Diagnosis not present

## 2021-10-19 DIAGNOSIS — C7951 Secondary malignant neoplasm of bone: Secondary | ICD-10-CM

## 2021-10-19 MED ORDER — SODIUM CHLORIDE 0.9 % IV SOLN
25.0000 mg/m2 | Freq: Once | INTRAVENOUS | Status: AC
  Administered 2021-10-19: 44 mg via INTRAVENOUS
  Filled 2021-10-19: qty 44

## 2021-10-19 MED ORDER — PALONOSETRON HCL INJECTION 0.25 MG/5ML
0.2500 mg | Freq: Once | INTRAVENOUS | Status: AC
  Administered 2021-10-19: 0.25 mg via INTRAVENOUS
  Filled 2021-10-19: qty 5

## 2021-10-19 MED ORDER — FAMOTIDINE 20 MG IN NS 100 ML IVPB
20.0000 mg | Freq: Once | INTRAVENOUS | Status: AC
  Administered 2021-10-19: 20 mg via INTRAVENOUS
  Filled 2021-10-19: qty 100

## 2021-10-19 MED ORDER — MAGNESIUM SULFATE 2 GM/50ML IV SOLN
2.0000 g | Freq: Once | INTRAVENOUS | Status: AC
  Administered 2021-10-19: 2 g via INTRAVENOUS
  Filled 2021-10-19: qty 50

## 2021-10-19 MED ORDER — SODIUM CHLORIDE 0.9 % IV SOLN
800.0000 mg/m2 | Freq: Once | INTRAVENOUS | Status: AC
  Administered 2021-10-19: 1406 mg via INTRAVENOUS
  Filled 2021-10-19: qty 36.98

## 2021-10-19 MED ORDER — SODIUM CHLORIDE 0.9 % IV SOLN: Freq: Once | INTRAVENOUS | Status: AC

## 2021-10-19 MED ORDER — POTASSIUM CHLORIDE IN NACL 20-0.9 MEQ/L-% IV SOLN
Freq: Once | INTRAVENOUS | Status: AC
  Filled 2021-10-19: qty 1000

## 2021-10-19 MED ORDER — SODIUM CHLORIDE 0.9 % IV SOLN
150.0000 mg | Freq: Once | INTRAVENOUS | Status: AC
  Administered 2021-10-19: 150 mg via INTRAVENOUS
  Filled 2021-10-19: qty 150

## 2021-10-19 MED ORDER — SODIUM CHLORIDE 0.9 % IV SOLN
10.0000 mg | Freq: Once | INTRAVENOUS | Status: AC
  Administered 2021-10-19: 10 mg via INTRAVENOUS
  Filled 2021-10-19: qty 10

## 2021-10-19 MED ORDER — HEPARIN SOD (PORK) LOCK FLUSH 100 UNIT/ML IV SOLN
500.0000 [IU] | Freq: Once | INTRAVENOUS | Status: AC | PRN
  Administered 2021-10-19: 500 [IU]

## 2021-10-19 MED ORDER — SODIUM CHLORIDE 0.9% FLUSH
10.0000 mL | INTRAVENOUS | Status: DC | PRN
  Administered 2021-10-19: 10 mL

## 2021-10-19 MED ORDER — SODIUM CHLORIDE 0.9 % IV SOLN
1500.0000 mg | Freq: Once | INTRAVENOUS | Status: AC
  Administered 2021-10-19: 1500 mg via INTRAVENOUS
  Filled 2021-10-19: qty 30

## 2021-10-19 NOTE — Telephone Encounter (Signed)
Called patient x2 w/ Midwife # [phone]. Left message in reference to patient's 3:00pm telephone appointment w/ Shona Simpson, PA-C. This call is in regards to completing patient's nursing portion of appointment. I left my extension # [phone] for patient to return call, if possible before appointment time. I will attempt to call patient again before appointment time.

## 2021-10-19 NOTE — Progress Notes (Signed)
Radiation Oncology         [phone] ________________________________  Outpatient Follow Up - Conducted via telephone due to current COVID-19 concerns for limiting patient exposure  I spoke with the patient to conduct this consult visit via telephone to spare the patient unnecessary potential exposure in the healthcare setting during the current COVID-19 pandemic. The patient was notified in advance and was offered a [place] meeting to allow for face to face communication but unfortunately reported that they did not have the appropriate resources/technology to support such a visit and instead preferred to proceed with a telephone visit.   ________________________________  Name: Jason Lyons        MRN: [id number]  Date of Service: 10/19/2021 DOB: May 02, 1956  CC:Pcp, No  Heath Lark, MD     REFERRING PHYSICIAN: Heath Lark, MD   DIAGNOSIS: The primary encounter diagnosis was 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[facility]). A diagnosis of Cancer of biliary tract [facility]) was also pertinent to this visit.   HISTORY OF PRESENT ILLNESS: Jason Lyons is a 66 y.o. male seen at the request of Dr. 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 Trace free fluid and known T4 lesion was seen. An ultrasound biopsy of the liver on 05/15/21 showed adenocarcinoma consistent with pancreatobiliary primary. Repeat CTPA on 05/16/21 confirmed the PE seen on prior imaging and an MRI brain on 05/16/21 showed no metastatic disease. He began chemotherapy on 05/29/21. Given his  T4 disease, he was treated with stereotactic radiosurgery.   He continues systemic therapy and is receiving chemotherapy today. He went for surveillance MRI of the thoracic spine on 10/14/21 that showed post treatment changes in the area of T4 spine. He does have a 55% height loss in the T4 vertebral body from fracture with new T3 inferior endplate fracture and a T5 superior endplate fracture. He's contacted today to review these results.   PREVIOUS RADIATION THERAPY:   06/09/2021 through 06/09/2021 Site Technique Total Dose (Gy) Dose per Fx (Gy) Completed Fx Beam Energies  Thoracic Spine: Spine_T4 IMRT 18/18 18 1/1 6XFFF     PAST MEDICAL HISTORY: No past medical history on file.     PAST SURGICAL HISTORY: Past Surgical History:  Procedure Laterality Date   IR IMAGING GUIDED PORT INSERTION  05/19/2021     FAMILY HISTORY:  Family History  Problem Relation Age of Onset   Cancer Father        throat cancer     SOCIAL HISTORY:  reports that he quit smoking about 5 months ago. His smoking use included cigarettes. He has a 20.00 pack-year smoking history. He has been exposed to tobacco smoke. He has never used smokeless tobacco. He reports current alcohol use of about 1.0 - 2.0 standard drink per week. He reports that he does not use drugs. The patient is married and lives in [place]. He will be planning to enroll in disability. He is accompanied by his son Jason Lyons.    ALLERGIES: Patient has no known allergies.   MEDICATIONS:  Current Outpatient Medications  Medication  Sig Dispense Refill   apixaban (ELIQUIS) 5 MG TABS tablet Take 1 tablet by mouth 2 times daily. 60 tablet 6   lidocaine-prilocaine (EMLA) cream Apply to affected area once daily 30 g 3   ondansetron (ZOFRAN) 8 MG tablet Take 1 tablet by mouth every 8 hours as needed. 30 tablet 1   oxyCODONE (OXY IR/ROXICODONE) 5 MG immediate release tablet Take 1 tablet (5 mg total) by mouth every 4 hours as needed for severe pain.  60 tablet 0   prochlorperazine (COMPAZINE) 10 MG tablet Take 1 tablet by mouth every 6 hours as needed (Nausea or vomiting). 30 tablet 1   No current facility-administered medications for this encounter.   Facility-Administered Medications Ordered in Other Encounters  Medication Dose Route Frequency Provider Last Rate Last Admin   CISplatin (PLATINOL) 44 mg in sodium chloride 0.9 % 250 mL chemo infusion  25 mg/m2 (Treatment Plan Recorded) Intravenous Once Alvy Bimler, Ni, MD       durvalumab (IMFINZI) 1,500 mg in sodium chloride 0.9 % 100 mL chemo infusion  1,500 mg Intravenous Once Alvy Bimler, Ni, MD 130 mL/hr at 10/19/21 1214 1,500 mg at 10/19/21 1214   gemcitabine (GEMZAR) 1,406 mg in sodium chloride 0.9 % 250 mL chemo infusion  800 mg/m2 (Treatment Plan Recorded) Intravenous Once Alvy Bimler, Ni, MD       heparin lock flush 100 unit/mL  500 Units Intracatheter Once PRN Alvy Bimler, Ni, MD       sodium chloride flush (NS) 0.9 % injection 10 mL  10 mL Intracatheter PRN Alvy Bimler, Ni, MD         REVIEW OF SYSTEMS: On review of systems, the patient states he is not experiencing any pain in his back.  Occasionally he has some heaviness but denies any changes in posture.  He seems to be doing well overall with systemic therapy and is currently in the infusion room at the cancer center receiving treatment from Dr. Alvy Bimler.  No specific complaints are otherwise verbalized.     PHYSICAL EXAM:  Unable to assess given encounter type.   ECOG = 1  0 - Asymptomatic (Fully active, able to carry on all predisease activities without restriction)  1 - Symptomatic but completely ambulatory (Restricted in physically strenuous activity but ambulatory and able to carry out work of a light or sedentary nature. For example, light housework, office work)  2 - Symptomatic, <50% in bed during the day (Ambulatory and capable of all self care but unable to carry out any work activities. Up and about more than 50% of waking  hours)  3 - Symptomatic, >50% in bed, but not bedbound (Capable of only limited self-care, confined to bed or chair 50% or more of waking hours)  4 - Bedbound (Completely disabled. Cannot carry on any self-care. Totally confined to bed or chair)  5 - Death   Eustace Pen MM, Creech RH, Tormey DC, et al. [id number]). "Toxicity and response criteria of the [facility]". [place] Oncol. 5 (6): 649-55    LABORATORY DATA:  Lab Results  Component Value Date   WBC 4.5 10/16/2021   HGB 12.9 (L) 10/16/2021   HCT 38.5 (L) 10/16/2021   MCV 101.9 (H) 10/16/2021   PLT 220 10/16/2021   Lab Results  Component Value Date   NA 139 10/16/2021   K 4.4 10/16/2021   CL 105 10/16/2021   CO2 27 10/16/2021   Lab Results  Component Value Date   ALT 48 (H) 10/16/2021  AST 36 10/16/2021   GGT 687 (H) 05/15/2021   ALKPHOS 249 (H) 10/16/2021   BILITOT 0.3 10/16/2021      RADIOGRAPHY: MR THORACIC SPINE W WO CONTRAST  Result Date: 10/14/2021 CLINICAL DATA:  Spine metastasis ([place]) C79.51 (ICD-10-CM). Metastatic disease evaluation; SRS Treatment planning Spine scan. EXAM: MRI THORACIC WITHOUT AND WITH CONTRAST TECHNIQUE: Multiplanar and multiecho pulse sequences of the thoracic spine were obtained without and with intravenous contrast. CONTRAST:  33mL MULTIHANCE GADOBENATE DIMEGLUMINE 529 MG/ML IV SOLN COMPARISON:  MRI of the thoracic spinal June 03, 2021. FINDINGS: Alignment:  Normal anteroposterior alignment. Vertebrae: Redemonstrated pathologic fracture of the T4 vertebral body resulting in approximately 55% vertebral body height loss. The vertebral body is mildly expanded with extension of tumor into the bilateral pedicles and mild retropulsion resulting in mild spinal canal stenosis. There is new fracture of the inferior endplate with associated marrow edema and approximately 15% loss of vertebral body height. Minimal compression fracture of the superior endplate of T5 is also seen. Cord:   Normal signal and morphology. Paraspinal and other soft tissues: A 1.1 cm T2 hyperintense right hepatic lesion and biliary dilatation are better evaluated on CT of the abdomen performed July 29, 2021. Disc levels: Mild spinal canal stenosis, mild right and moderate left neural foraminal narrowing at C4-5 related to the T4 vertebral body above described lesion. No significant disc bulge or herniation at any level. No significant spinal canal or neural foraminal stenosis in the remainder thoracic spine. IMPRESSION: 1. Mild interval progression of T4 of pathologic vertebral body collapse now with approximately 55% loss of vertebral body height. 2. New T3 inferior endplate fracture and T5 superior endplate fracture since prior MRI. Electronically Signed   By: Lyons Earls M.D.   On: 10/14/2021 16:22       IMPRESSION/PLAN: 1. 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  The patient appears to be doing radiographically well as well as clinically.  Despite his fractures, the patient states that he is asymptomatic.  We discussed that if he became symptomatic with pain or neuropathic symptoms that we would be interested in knowing so that we could coordinate with neurosurgery.  He is in agreement.  We will plan repeat MRI in 3 months and annually entire spine screening.  He is in agreement with this plan.  Given current concerns for patient exposure during the COVID-19 pandemic, this encounter was conducted via telephone.  The patient has provided two factor identification and has given verbal consent for this type of encounter and has been advised to only accept a meeting of this type in a secure network environment. The time spent during this encounter was 30 minutes including preparation, discussion, and coordination of the patient's care. The attendants for this meeting include  Jason Lyons  and Jason Lyons. And the pacific  interpretor.  During the encounter,  Jason Lyons was located remotely at home. Jason Lyons was located at the cancer center in the infusion.     Carola Rhine, [facility]   **Disclaimer: This note was dictated with voice recognition software. Similar sounding words can inadvertently be transcribed and this note may contain transcription errors which may not have been corrected upon publication of note.**

## 2021-10-19 NOTE — Patient Instructions (Signed)
[facility] [place] [facility]  Discharge Instructions: Thank you for choosing [facility] to provide your oncology and hematology care.   If you have a lab appointment with the [place], please go directly to the [facility] and check in at the registration area.   Wear comfortable clothing and clothing appropriate for easy access to any Portacath or PICC line.   We strive to give you quality time with your provider. You may need to reschedule your appointment if you arrive late (15 or more minutes).  Arriving late affects you and other patients whose appointments are after yours.  Also, if you miss three or more appointments without notifying the office, you may be dismissed from the clinic at the providers discretion.      For prescription refill requests, have your pharmacy contact our office and allow 72 hours for refills to be completed.    Today you received the following chemotherapy and/or immunotherapy agents: Imfinzi, Gemzar, & Cisplatin   To help prevent nausea and vomiting after your treatment, we encourage you to take your nausea medication as directed.  BELOW ARE SYMPTOMS THAT SHOULD BE REPORTED IMMEDIATELY: *FEVER GREATER THAN 100.4 F (38 C) OR HIGHER *CHILLS OR SWEATING *NAUSEA AND VOMITING THAT IS NOT CONTROLLED WITH YOUR NAUSEA MEDICATION *UNUSUAL SHORTNESS OF BREATH *UNUSUAL BRUISING OR BLEEDING *URINARY PROBLEMS (pain or burning when urinating, or frequent urination) *BOWEL PROBLEMS (unusual diarrhea, constipation, pain near the anus) TENDERNESS IN MOUTH AND THROAT WITH OR WITHOUT PRESENCE OF ULCERS (sore throat, sores in mouth, or a toothache) UNUSUAL RASH, SWELLING OR PAIN  UNUSUAL VAGINAL DISCHARGE OR ITCHING   Items with * indicate a potential emergency and should be followed up as soon as possible or go to the Emergency Department if any problems should occur.  Please show the CHEMOTHERAPY ALERT CARD or IMMUNOTHERAPY ALERT CARD  at check-in to the Emergency Department and triage nurse.  Should you have questions after your visit or need to cancel or reschedule your appointment, please contact [facility] [place]  Dept: [phone]  and follow the prompts.  Office hours are 8:00 a.m. to 4:30 p.m. Monday - Friday. Please note that voicemails left after 4:00 p.m. may not be returned until the following business day.  We are closed weekends and major holidays. You have access to a nurse at all times for urgent questions. Please call the main number to the clinic Dept: [phone] and follow the prompts.   For any non-urgent questions, you may also contact your provider using MyChart. We now offer e-Visits for anyone 60 and older to request care online for non-urgent symptoms. For details visit [email or web address].   Also download the MyChart app! Go to the app store, search "MyChart", open the app, select [place], and log in with your MyChart username and password.  Due to Covid, a mask is required upon entering the hospital/clinic. If you do not have a mask, one will be given to you upon arrival. For doctor visits, patients may have 1 support person aged 23 or older with them. For treatment visits, patients cannot have anyone with them due to current Covid guidelines and our immunocompromised population.

## 2021-10-19 NOTE — Progress Notes (Signed)
Spoke w/ patient's son "Saman Umstead", who is cleared to speak on his dad's behalf. I verified his identity and begin the nursing interview. Son states "Dad is doing well. No symptoms to report at this time."  Meaningful use complete.  I notified Mr. Delahoussaye son of his dad's 3:00pm -10/19/21 telephone appointment w/ Shona Simpson, PA-C and he verbalized understanding.   Patient preferred contact # (Son) [phone] -English Speaking

## 2021-10-20 ENCOUNTER — Telehealth: Payer: Self-pay

## 2021-10-20 NOTE — Telephone Encounter (Signed)
-----   Message from Heath Lark, MD sent at 10/19/2021  5:33 PM EST ----- His son is supposed to call and schedule CT Can you call and help remind him?

## 2021-10-20 NOTE — Telephone Encounter (Signed)
Called and instructed to call and schedule CT. Given phone #. He will call and schedule.

## 2021-10-26 ENCOUNTER — Other Ambulatory Visit: Payer: Self-pay

## 2021-10-26 ENCOUNTER — Ambulatory Visit (HOSPITAL_COMMUNITY)
Admission: RE | Admit: 2021-10-26 | Discharge: 2021-10-26 | Disposition: A | Discharge status: Home / Self Care | Payer: Managed Care, Other (non HMO) | Source: Ambulatory Visit | Attending: Hematology and Oncology | Admitting: Hematology and Oncology

## 2021-10-26 ENCOUNTER — Encounter: Payer: Self-pay | Admitting: Hematology and Oncology

## 2021-10-26 DIAGNOSIS — C249 Malignant neoplasm of biliary tract, unspecified: Secondary | ICD-10-CM | POA: Insufficient documentation

## 2021-10-26 MED ORDER — IOHEXOL 300 MG/ML  SOLN
100.0000 mL | Freq: Once | INTRAMUSCULAR | Status: AC | PRN
  Administered 2021-10-26: 100 mL via INTRAVENOUS

## 2021-10-30 ENCOUNTER — Other Ambulatory Visit: Payer: Managed Care, Other (non HMO)

## 2021-10-30 ENCOUNTER — Inpatient Hospital Stay (HOSPITAL_BASED_OUTPATIENT_CLINIC_OR_DEPARTMENT_OTHER): Payer: Managed Care, Other (non HMO) | Admitting: Hematology and Oncology

## 2021-10-30 ENCOUNTER — Other Ambulatory Visit: Payer: Self-pay

## 2021-10-30 DIAGNOSIS — Z7189 Other specified counseling: Secondary | ICD-10-CM

## 2021-10-30 DIAGNOSIS — C249 Malignant neoplasm of biliary tract, unspecified: Secondary | ICD-10-CM | POA: Diagnosis not present

## 2021-10-30 DIAGNOSIS — Z5112 Encounter for antineoplastic immunotherapy: Secondary | ICD-10-CM | POA: Diagnosis not present

## 2021-11-01 ENCOUNTER — Encounter: Payer: Self-pay | Admitting: Hematology and Oncology

## 2021-11-01 NOTE — Assessment & Plan Note (Signed)
Unfortunately he has stopped responding to palliative chemo His overall prognosis is very poor We discussed role of second line treatment such as FOLFOX or consideration for sending his tissue for [facility] testing for molecular targets versus second opinion Ultimately he declined further treatment

## 2021-11-01 NOTE — Assessment & Plan Note (Signed)
We have significant goals of care discussion today We discussed prognosis without treatment in less than 6 months; with chemo, in the best case scenario might only extend his life a few more months up to extra 1 year at the most We discussed what to expect without further treatment I suggest he has further discussion with family this weekend and we will call his son next week for final decision of how to proceed

## 2021-11-01 NOTE — Progress Notes (Signed)
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 His overall prognosis is very poor We discussed role of second line treatment such as FOLFOX or consideration for sending his tissue for [facility] testing for molecular targets versus second opinion Ultimately he declined further treatment  Goals of care, counseling/discussion We have significant goals of care discussion today We discussed prognosis without treatment in less than 6 months; with chemo, in the best case scenario might only extend his life a few more months up to extra 1 year at the most We discussed what to expect without further treatment I suggest he has further discussion with family this weekend and we will call his son next week for final decision of how to proceed  No orders of the defined types were placed in this encounter.   All questions were answered. The patient knows to call the clinic with any problems, questions or concerns. The total time spent in the appointment was 55 minutes encounter with patients including review of chart and various tests results, discussions about plan of care and coordination of care plan   Heath Lark, MD 11/01/2021 1:34 PM  INTERVAL HISTORY: Please see below for problem oriented charting. he returns for treatment follow-up with his son  Interpreter is present He denies side-effects from recent treatment  REVIEW OF SYSTEMS:   Constitutional: Denies fevers, chills or abnormal weight loss Eyes: Denies blurriness of vision Ears, nose, mouth, throat, and face: Denies mucositis or sore throat Respiratory: Denies cough, dyspnea or wheezes Cardiovascular: Denies palpitation, chest discomfort or lower extremity swelling Gastrointestinal:  Denies nausea, heartburn or change in bowel habits Skin: Denies abnormal skin  rashes Lymphatics: Denies new lymphadenopathy or easy bruising Neurological:Denies numbness, tingling or new weaknesses Behavioral/Psych: Mood is stable, no new changes  All other systems were reviewed with the patient and are negative.  I have reviewed the past medical history, past surgical history, social history and family history with the patient and they are unchanged from previous note.  ALLERGIES:  has No Known Allergies.  MEDICATIONS:  Current Outpatient Medications  Medication Sig Dispense Refill   apixaban (ELIQUIS) 5 MG TABS tablet Take 1 tablet by mouth 2 times daily. 60 tablet 6   cholecalciferol (VITAMIN D3) 25 MCG (1000 UNIT) tablet Take 1,000 Units by mouth daily.     oxyCODONE (OXY IR/ROXICODONE) 5 MG immediate release tablet Take 1 tablet (5 mg total) by mouth every 4 hours as needed for severe pain. 60 tablet 0   No current facility-administered medications for this visit.    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 He underwent liver biopsy   05/14/2021 Imaging   RIGHT:  - There is no evidence of deep vein thrombosis in the lower extremity.     - No cystic structure found in the popliteal fossa.     LEFT:  - There is no evidence of deep vein thrombosis in the lower extremity.     - No cystic structure found in the popliteal fossa.    05/14/2021 Imaging   1. Pathologic split type fracture of T4 with approximately 25% height loss, likely due to metastatic disease. 2. No other evidence of metastatic  disease of the cervical, thoracic or lumbar spine. 3. Moderate left L5-S1 neural foraminal stenosis. 4. Left-greater-than-right lateral recess narrowing at L4-5 and L5-S1.     05/14/2021 Imaging   1. 5 cm ill-defined irregular low-density lesion in the anterior right liver inferiorly is highly suspicious  for primary or metastatic neoplasm. The lesion obliterates bile ducts in the central right liver resulting in right hepatic lobe biliary dilatation. Similarly, portal venous anatomy in the anterior right liver is involved along the superomedial margin of the lesion. Lesion should be amenable to tissue sampling. 2. Gallbladder wall is contracted and irregular in appearance adjacent to the lesion. Imaging features suggest that gallbladder changes are secondary although primary gallbladder neoplasm not excluded. 3. 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 No evidence for additional metastatic disease in the abdomen or pelvis. 4. Prostatomegaly. 5. Trace free fluid in the pelvis. 6. Aortic Atherosclerosis (ICD10-I70.0).   05/15/2021 Pathology Results   Clinical History: Right hepatic tumor and evidence for bone metastasis ([facility])    FINAL MICROSCOPIC DIAGNOSIS:   A. LIVER, RIGHT, BIOPSY:  -  Adenocarcinoma  -  See comment   COMMENT:   By immunohistochemistry, the neoplastic cells are positive for cytokeratin 7, monoclonal and polyclonal CEA with patchy positivity for cytokeratin 20.  The tumor cells are negative for TTF-1, AFP, prostein, PSA and CDX2.  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     05/15/2021 Procedure   Ultrasound-guided core biopsies of a right hepatic lesion.   05/16/2021 Imaging   MRI brain  Unremarkable appearance of the brain for age. No evidence of intracranial metastases.     05/16/2021 Imaging   Ct chest  1. Study is once again positive for subsegmental sized emboli in the right lower lobe. No larger central, lobar or segmental sized emboli are noted. 2. Lytic lesion in T4 with pathologic fracture, similar to the prior study. This was better depicted on recent thoracic spine MRI. 3. Aortic atherosclerosis, in addition to left main and  3 vessel coronary artery disease. There is also mild ectasia of the ascending thoracic aorta (4.0 cm in diameter).    05/19/2021 Procedure   Ultrasound and fluoroscopically guided right internal jugular single lumen power port catheter insertion. Tip in the SVC/RA junction. Catheter ready for use.   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 Results of the tumor marker test revealed 1627.   07/17/2021 Tumor Marker   Patient's tumor was tested for the following markers: CA19-9. Results of the tumor marker test revealed 910.   07/30/2021 Imaging   Decreased size of mass lesion with biliary duct dilation the capsular retraction with imaging features of cholangiocarcinoma.   Soft tissue along the capsular margin also with decreased conspicuity.   T4 metastatic lesion with increasing sclerosis, potentially reflecting some healing in the interval of this pathologic fracture and associated metastatic disease.   Aortic atherosclerosis.     08/21/2021 Tumor Marker   Patient's tumor  was tested for the following markers: CA-19-9. Results of the tumor marker test revealed 535.   10/14/2021 Imaging   IMPRESSION: 1. Mild interval progression of T4 of pathologic vertebral body collapse now with approximately 55% loss of vertebral body height. 2. New T3 inferior endplate fracture and T5 superior endplate fracture since prior MRI.   10/16/2021 Tumor Marker   Patient's tumor was tested for the following markers: CA19-9. Results of the tumor marker test  revealed 1115.   10/27/2021 Imaging   1. Interval slight increase in intrahepatic biliary duct dilatation involving the right liver. Peripheral ill-defined mass lesion measures slightly larger in the interval. 2. No evidence for soft tissue metastatic disease in the chest, abdomen, or pelvis. 3. Compression deformity at T4 with increasing sclerosis. Again, this may reflect some healing of this pathologic fracture. 4. Aortic Atherosclerosis (ICD10-I70.0).     PHYSICAL EXAMINATION: ECOG PERFORMANCE STATUS: 0 - Asymptomatic  Vitals:   10/30/21 1059  BP: (!) 146/92  Pulse: 76  Resp: 18  Temp: (!) 97.4 F (36.3 C)  SpO2: 97%   Filed Weights   10/30/21 1059  Weight: 150 lb 6.4 oz (68.2 kg)    GENERAL:alert, no distress and comfortable   NEURO: alert & oriented x 3 with fluent speech, no focal motor/sensory deficits  LABORATORY DATA:  I have reviewed the data as listed    Component Value Date/Time   NA 139 10/16/2021 1340   NA 145 (H) 04/28/2018 1054   K 4.4 10/16/2021 1340   CL 105 10/16/2021 1340   CO2 27 10/16/2021 1340   GLUCOSE 101 (H) 10/16/2021 1340   BUN 13 10/16/2021 1340   BUN 13 04/28/2018 1054   CREATININE 0.75 10/16/2021 1340   CALCIUM 9.5 10/16/2021 1340   PROT 7.2 10/16/2021 1340   PROT 6.8 04/28/2018 1054   ALBUMIN 4.2 10/16/2021 1340   ALBUMIN 4.4 04/28/2018 1054   AST 36 10/16/2021 1340   ALT 48 (H) 10/16/2021 1340   ALKPHOS 249 (H) 10/16/2021 1340   BILITOT 0.3 10/16/2021 1340   GFRNONAA >60 10/16/2021 1340   GFRAA >60 08/22/2018 0904    No results found for: SPEP, UPEP  Lab Results  Component Value Date   WBC 4.5 10/16/2021   NEUTROABS 2.7 10/16/2021   HGB 12.9 (L) 10/16/2021   HCT 38.5 (L) 10/16/2021   MCV 101.9 (H) 10/16/2021   PLT 220 10/16/2021      Chemistry      Component Value Date/Time   NA 139 10/16/2021 1340   NA 145 (H) 04/28/2018 1054   K 4.4 10/16/2021 1340   CL 105 10/16/2021 1340   CO2 27 10/16/2021 1340   BUN 13  10/16/2021 1340   BUN 13 04/28/2018 1054   CREATININE 0.75 10/16/2021 1340      Component Value Date/Time   CALCIUM 9.5 10/16/2021 1340   ALKPHOS 249 (H) 10/16/2021 1340   AST 36 10/16/2021 1340   ALT 48 (H) 10/16/2021 1340   BILITOT 0.3 10/16/2021 1340       RADIOGRAPHIC STUDIES: I have personally reviewed the radiological images as listed and agreed with the findings in the report. 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 Metastatic disease evaluation; SRS Treatment planning Spine scan. EXAM: MRI THORACIC WITHOUT AND WITH CONTRAST TECHNIQUE: Multiplanar and multiecho pulse sequences of the thoracic spine were obtained without and with intravenous contrast. CONTRAST:  90mL MULTIHANCE GADOBENATE DIMEGLUMINE 529 MG/ML  IV SOLN COMPARISON:  MRI of the thoracic spinal June 03, 2021. FINDINGS: Alignment:  Normal anteroposterior alignment. Vertebrae: Redemonstrated pathologic fracture of the T4 vertebral body resulting in approximately 55% vertebral body height loss. The vertebral body is mildly expanded with extension of tumor into the bilateral pedicles and mild retropulsion resulting in mild spinal canal stenosis. There is new fracture of the inferior endplate with associated marrow edema and approximately 15% loss of vertebral body height. Minimal compression fracture of the superior endplate of T5 is also seen. Cord:  Normal signal and morphology. Paraspinal and other soft tissues: A 1.1 cm T2 hyperintense right hepatic lesion and biliary dilatation are better evaluated on CT of the abdomen performed July 29, 2021. Disc levels: Mild spinal canal stenosis, mild right and moderate left neural foraminal narrowing at C4-5 related to the T4 vertebral body above described lesion. No significant disc bulge or herniation at any level. No significant spinal canal or neural foraminal stenosis in the remainder thoracic spine.  IMPRESSION: 1. Mild interval progression of T4 of pathologic vertebral body collapse now with approximately 55% loss of vertebral body height. 2. New T3 inferior endplate fracture and T5 superior endplate fracture since prior MRI. Electronically Signed   By: Pedro Earls M.D.   On: 10/14/2021 16:22   CT CHEST ABDOMEN PELVIS W CONTRAST  Result Date: 10/26/2021 CLINICAL DATA:  Cancer of the biliary tract.  Restaging. EXAM: CT CHEST, ABDOMEN, AND PELVIS WITH CONTRAST TECHNIQUE: Multidetector CT imaging of the chest, abdomen and pelvis was performed following the standard protocol during bolus administration of intravenous contrast. RADIATION DOSE REDUCTION: This exam was performed according to the departmental dose-optimization program which includes automated exposure control, adjustment of the mA and/or kV according to patient size and/or use of iterative reconstruction technique. CONTRAST:  172mL OMNIPAQUE IOHEXOL 300 MG/ML  SOLN COMPARISON:  07/29/2021 FINDINGS: CT CHEST FINDINGS Cardiovascular: The heart size is normal. No substantial pericardial effusion. Coronary artery calcification is evident. Mild atherosclerotic calcification is noted in the wall of the thoracic aorta. Ascending thoracic aorta measures 4.0 cm diameter. Right Port-A-Cath tip is positioned at the SVC/RA junction. Mediastinum/Nodes: No mediastinal lymphadenopathy. There is no hilar lymphadenopathy. The esophagus has normal imaging features. There is no axillary lymphadenopathy. Lungs/Pleura: No suspicious pulmonary nodule or mass. No focal airspace consolidation. No pleural effusion. Musculoskeletal: Compression deformity again noted at T4 with increasing sclerosis. CT ABDOMEN PELVIS FINDINGS Hepatobiliary: Interval slight increase in intrahepatic biliary duct dilatation involving the right liver. Ill-defined mass lesion measured previously at 4.7 x 3.5 cm is now 5.3 x 3.5 cm. Posterior component of this lesion measuring 2  cm today on image 55/2 was approximally 1.2 cm (remeasured) previously. Gallbladder is decompressed. No dilatation of the extrahepatic common duct. Pancreas: No focal mass lesion. No dilatation of the main duct. No intraparenchymal cyst. No peripancreatic edema. Spleen: No splenomegaly. No focal mass lesion. Adrenals/Urinary Tract: No adrenal nodule or mass. Kidneys unremarkable. No evidence for hydroureter. The urinary bladder appears normal for the degree of distention. Stomach/Bowel: Stomach is unremarkable. No gastric wall thickening. No evidence of outlet obstruction. Duodenum is normally positioned as is the ligament of Treitz. No small bowel wall thickening. No small bowel dilatation. The terminal ileum is normal. The appendix is normal. No gross colonic mass. No colonic wall thickening. Vascular/Lymphatic: There is mild atherosclerotic calcification of the abdominal aorta without aneurysm. There is no gastrohepatic or hepatoduodenal ligament lymphadenopathy. No retroperitoneal or mesenteric lymphadenopathy. No pelvic sidewall lymphadenopathy. Reproductive:  The prostate gland and seminal vesicles are unremarkable. Other: No intraperitoneal free fluid. Musculoskeletal: No worrisome lytic or sclerotic osseous abnormality. IMPRESSION: 1. Interval slight increase in intrahepatic biliary duct dilatation involving the right liver. Peripheral ill-defined mass lesion measures slightly larger in the interval. 2. No evidence for soft tissue metastatic disease in the chest, abdomen, or pelvis. 3. Compression deformity at T4 with increasing sclerosis. Again, this may reflect some healing of this pathologic fracture. 4. Aortic Atherosclerosis (ICD10-I70.0). Electronically Signed   By: Misty Stanley M.D.   On: 10/26/2021 18:12

## 2021-11-02 ENCOUNTER — Ambulatory Visit: Payer: Managed Care, Other (non HMO)

## 2021-11-04 ENCOUNTER — Telehealth: Payer: Self-pay | Admitting: Hematology and Oncology

## 2021-11-04 NOTE — Telephone Encounter (Signed)
I spoke with his son, Otho Ket, for final decision Overall, the patient and family is undecided about next step One of the patient's son is traveling abroad and will return early next month The family think it might be a great idea to have another meeting with me to discuss plan of care, prognosis and others At this point in time, since the patient/son is unaware when would be the best time to meet, I will defer to his son, doing to call us back He expressed understanding

## 2021-11-19 ENCOUNTER — Other Ambulatory Visit: Payer: Self-pay | Admitting: Radiation Therapy

## 2021-11-19 DIAGNOSIS — C7951 Secondary malignant neoplasm of bone: Secondary | ICD-10-CM

## 2021-11-25 ENCOUNTER — Encounter: Payer: Self-pay | Admitting: Radiation Therapy

## 2021-11-25 ENCOUNTER — Encounter: Payer: Self-pay | Admitting: Hematology and Oncology

## 2021-11-25 NOTE — Progress Notes (Signed)
Written reminder card including my contact information was mailed out to pt. regarding his April Thoracic Spine MRI and telephone follow-up with Shona Simpson PA-C, to review the results.   Mont Dutton R.T.(R)(T) Radiation Special Procedures Navigator

## 2021-12-01 ENCOUNTER — Encounter: Payer: Self-pay | Admitting: Hematology and Oncology

## 2021-12-09 ENCOUNTER — Encounter: Payer: Self-pay | Admitting: Hematology and Oncology

## 2021-12-10 ENCOUNTER — Telehealth: Payer: Self-pay

## 2021-12-10 NOTE — Telephone Encounter (Signed)
-----   Message from Heath Lark, MD sent at 12/10/2021  3:22 PM EST ----- ?Can you call his son and ask what is his plan? ?He should have hospice referral if he decides not to return to cancer center ? ?

## 2021-12-10 NOTE — Telephone Encounter (Signed)
Called and left below message. Ask son to call the office back. ?

## 2021-12-11 ENCOUNTER — Encounter: Payer: Self-pay | Admitting: Hematology and Oncology

## 2021-12-31 ENCOUNTER — Encounter: Payer: Self-pay | Admitting: Hematology and Oncology

## 2022-01-12 ENCOUNTER — Encounter: Payer: Self-pay | Admitting: Hematology and Oncology

## 2022-01-15 ENCOUNTER — Inpatient Hospital Stay: Admission: RE | Admit: 2022-01-15 | Payer: Managed Care, Other (non HMO) | Source: Ambulatory Visit

## 2022-01-18 ENCOUNTER — Telehealth: Payer: Self-pay | Admitting: Radiation Therapy

## 2022-01-18 ENCOUNTER — Inpatient Hospital Stay: Payer: MEDICAID

## 2022-01-18 NOTE — Telephone Encounter (Signed)
Called the Pt.'s son, Kareen Hitsman, and left a detailed message about the missed thoracic spine MRI. I informed him that I will cancel the follow-up with Shona Simpson, but would like to know Mr. Strawser desires about future follow-up. Does he want to reschedule the missed MRI and follow-up with Radiation Oncology provider, Shona Simpson?  ? ?I have included my contact information and requested a call back.  ? ?Mont Dutton R.T.(R)(T) ?Radiation Special Procedures Navigator  ?

## 2022-01-19 ENCOUNTER — Encounter (HOSPITAL_COMMUNITY): Payer: Self-pay

## 2022-01-19 ENCOUNTER — Ambulatory Visit: Payer: MEDICAID | Admitting: Radiation Oncology

## 2022-02-05 ENCOUNTER — Other Ambulatory Visit: Payer: Self-pay | Admitting: Radiation Therapy

## 2022-02-17 ENCOUNTER — Other Ambulatory Visit: Payer: Medicare Other

## 2022-02-22 ENCOUNTER — Ambulatory Visit: Payer: MEDICAID | Admitting: Radiation Oncology

## 2022-02-22 ENCOUNTER — Inpatient Hospital Stay: Payer: Medicare Other | Attending: Hematology and Oncology

## 2022-03-24 IMAGING — CT CT ABD-PEL WO/W CM
3 of 15 series · 10 of 46 positions shown, 16 images · IV contrast (omnipaque)
Comparison: CTA Chest 05/13/2021

CLINICAL DATA: Masslike area identified in the liver on recent CTA
Chest.

EXAM:
CT ABDOMEN AND PELVIS WITHOUT AND WITH CONTRAST
TECHNIQUE: Multidetector CT imaging of the abdomen and pelvis was performed
following the standard protocol before and following the bolus
administration of intravenous contrast.
CONTRAST:  100mL OMNIPAQUE IOHEXOL 350 MG/ML SOLN

[Series 12: axial venous · axial · portal-venous · 0.78mm/px · z∈[+1059,+1392]mm · 6 of 157 slices shown, 11 images]
[im 23/157  soft-tissue]
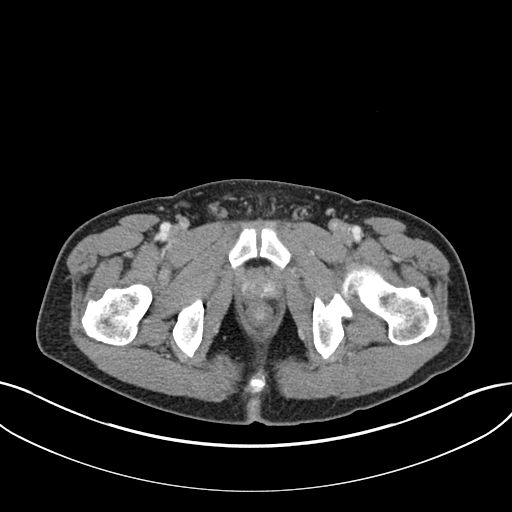
[im 23/157  bone]
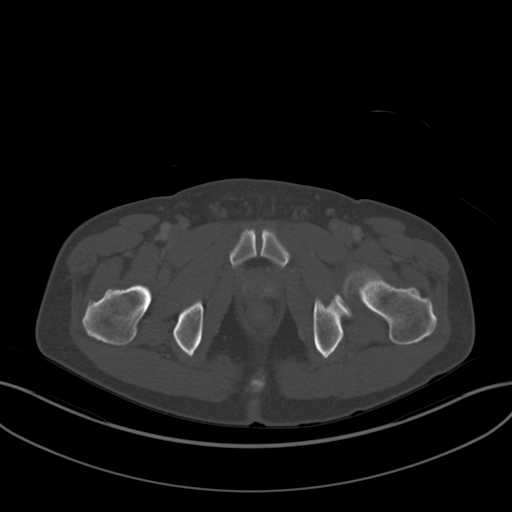
[im 45/157  soft-tissue]
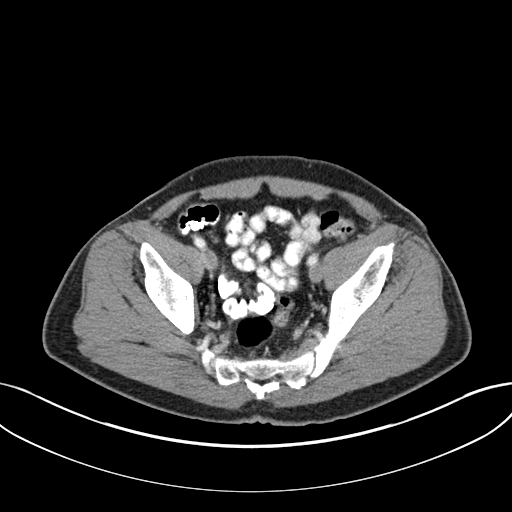
[im 67/157  soft-tissue]
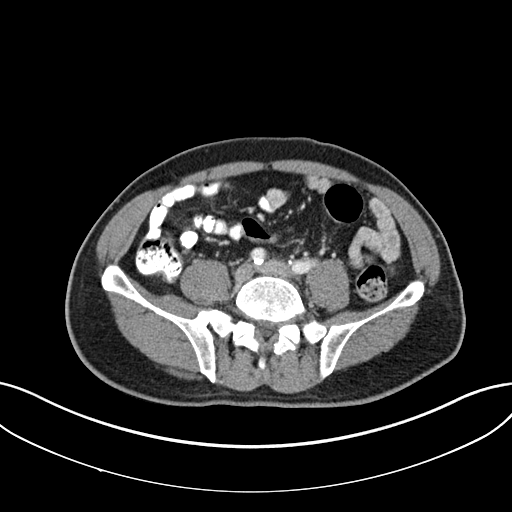
[im 67/157  lung]
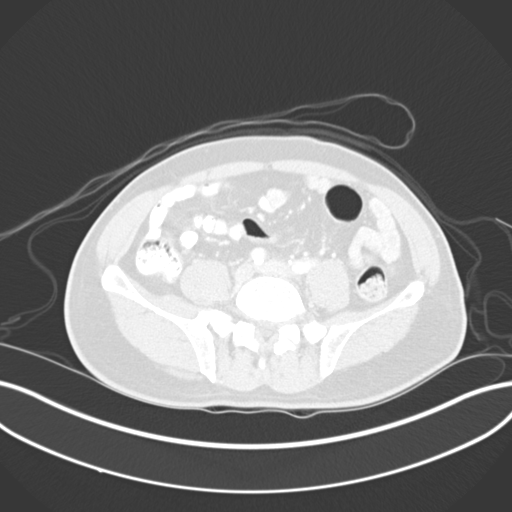
[im 90/157  soft-tissue]
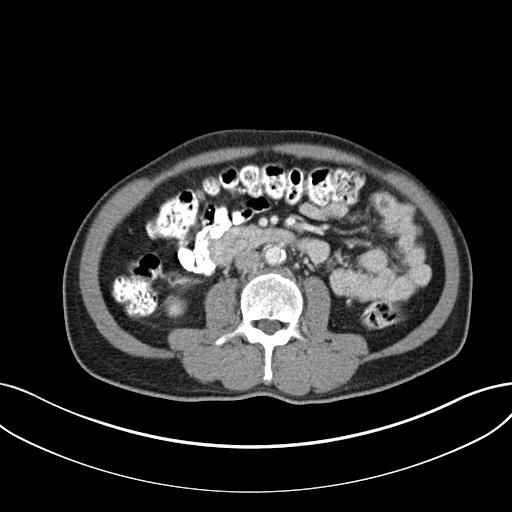
[im 90/157  lung]
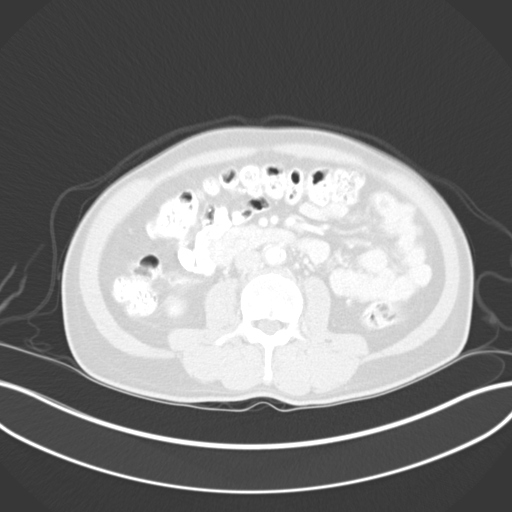
[im 112/157  soft-tissue]
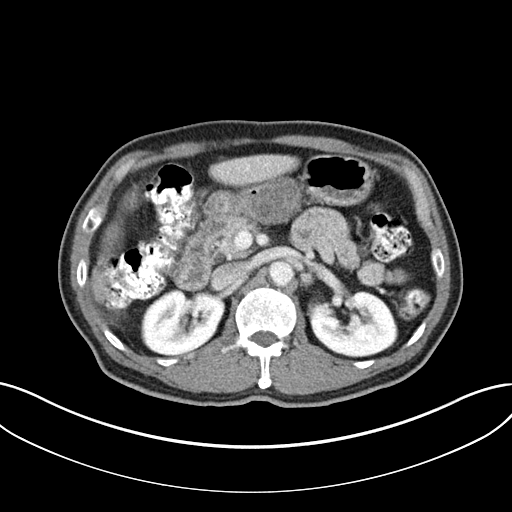
[im 112/157  lung]
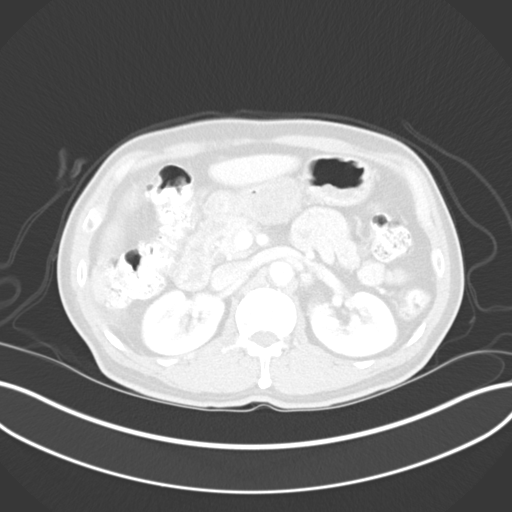
[im 134/157  soft-tissue]
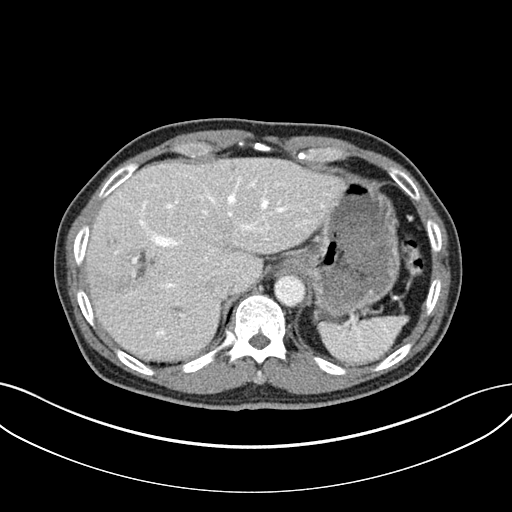
[im 134/157  lung]
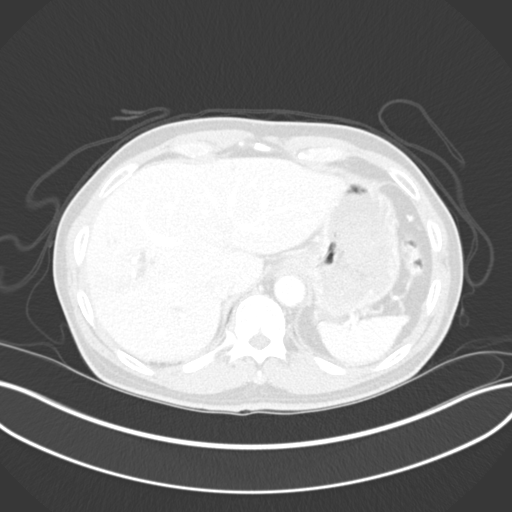

[Series 13: coronal venous · coronal · portal-venous · 0.79mm/px · 1 of 78 slices shown, 2 images]
[im 39/78  soft-tissue]
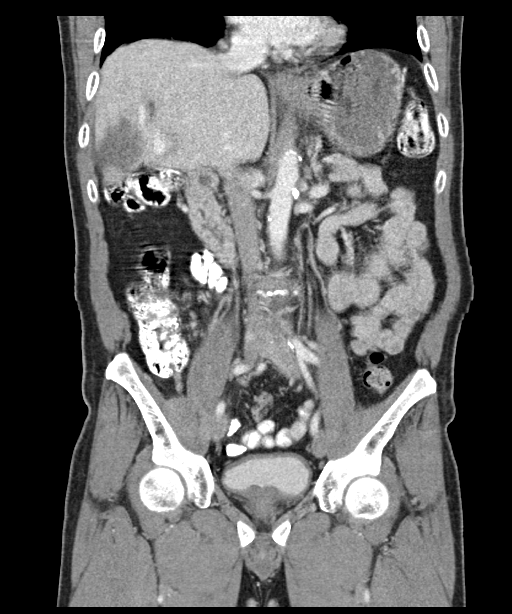
[im 39/78  bone]
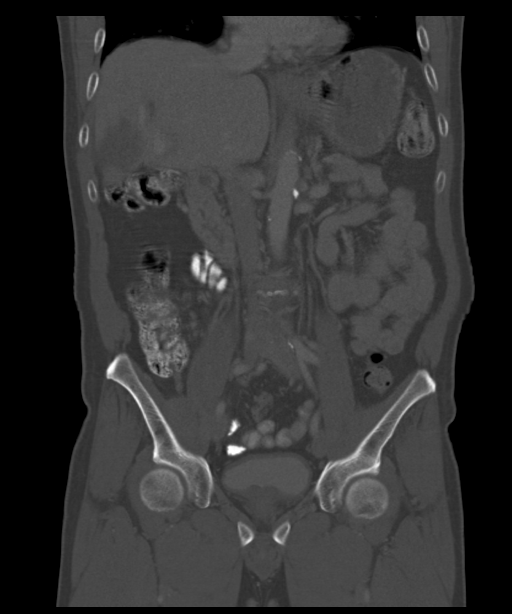

[Series 17: axial delay · axial · delayed · 0.78mm/px · z∈[+1111,+1341]mm · 3 of 94 slices shown]
[im 24/94  soft-tissue]
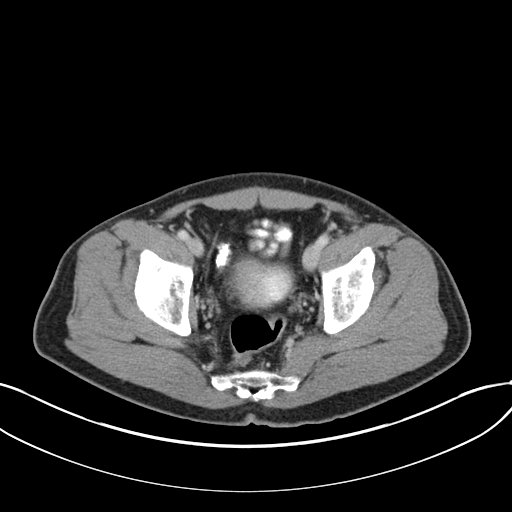
[im 47/94  soft-tissue]
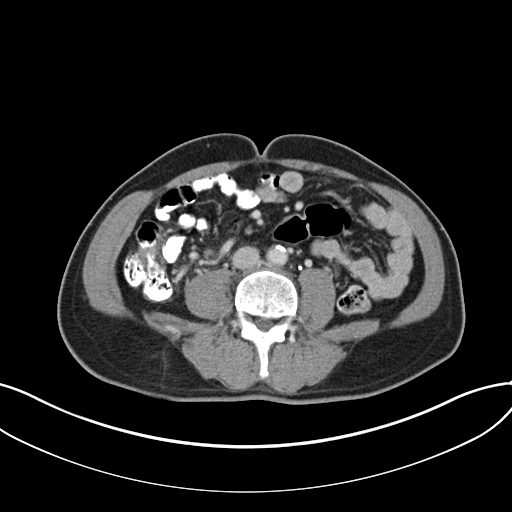
[im 70/94  soft-tissue]
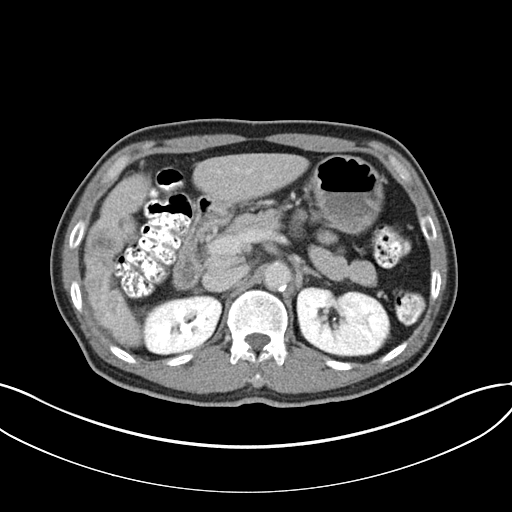

[10 of 46 positions shown; findings below may reference images not displayed]

FINDINGS: Lower chest: Dependent atelectasis in both lower lobes.

Hepatobiliary: 5.0 x 3.3 x 4.5 cm ill-defined irregular low-density
lesion is identified in the anterior right liver inferiorly.
Arterial phase imaging shows subtle irregular peripheral enhancement
with heterogeneous enhancement in the lesion inferiorly. Lesion
takes on a more infiltrative appearance at the inferior tip of the
liver extending more posteriorly. The mass tracks along the
gallbladder fossa towards the fundus in the gallbladder is
contracted and irregular in appearance at this location.

Focal biliary dilatation is seen in the anterior right liver
(segment VIII) into a lesser degree in the posterior right liver
(segments VI and VII). There is abrupt cut off of the dilated bile
ducts along the medial aspect of the right hepatic lobe lesion.
Similarly, portal venous anatomy appears obliterated centrally in
the anterior right liver along the superomedial margin of the
lesion.

As above, gallbladder appears irregular with diffuse wall
thickening. No extrahepatic biliary duct dilatation.

Pancreas: No focal mass lesion. No dilatation of the main duct. No
intraparenchymal cyst. No peripancreatic edema.

Spleen: No splenomegaly. No focal mass lesion.

Adrenals/Urinary Tract: No adrenal nodule or mass. Kidneys
unremarkable. No evidence for hydroureter. The urinary bladder
appears normal for the degree of distention.

Stomach/Bowel: Stomach is unremarkable. No gastric wall thickening.
No evidence of outlet obstruction. Duodenum is normally positioned
as is the ligament of Treitz. No small bowel wall thickening. No
small bowel dilatation. The terminal ileum is normal. The appendix
is normal. No gross colonic mass. No colonic wall thickening.

Vascular/Lymphatic: Insert calcium aorta upper normal lymph nodes
are seen in the hepatoduodenal ligament. No retroperitoneal
lymphadenopathy No pelvic sidewall lymphadenopathy.

Reproductive: Prostate gland is enlarged with heterogeneous
enhancement and dystrophic calcification.

Other: Trace free fluid is noted in the pelvis.

Musculoskeletal: No worrisome lytic or sclerotic osseous
abnormality.
IMPRESSION: 1. 5 cm ill-defined irregular low-density lesion in the anterior
right liver inferiorly is highly suspicious for primary or
metastatic neoplasm. The lesion obliterates bile ducts in the
central right liver resulting in right hepatic lobe biliary
dilatation. Similarly, portal venous anatomy in the anterior right
liver is involved along the superomedial margin of the lesion.
Lesion should be amenable to tissue sampling.
2. Gallbladder wall is contracted and irregular in appearance
adjacent to the lesion. Imaging features suggest that gallbladder
changes are secondary although primary gallbladder neoplasm not
excluded.
3. Known T4 metastatic lesion from yesterday's chest CT. No evidence
for additional metastatic disease in the abdomen or pelvis.
4. Prostatomegaly.
5. Trace free fluid in the pelvis.
6. Aortic Atherosclerosis ([id number]).

## 2022-09-05 IMAGING — CT CT CHEST-ABD-PELV W/ CM
2 of 5 series · 13 of 36 positions shown, 15 images · IV contrast (APPLIED)
Comparison: 07/29/2021

CLINICAL DATA: Cancer of the biliary tract.  Restaging.

EXAM:
CT CHEST, ABDOMEN, AND PELVIS WITH CONTRAST
TECHNIQUE: Multidetector CT imaging of the chest, abdomen and pelvis was
performed following the standard protocol during bolus
administration of intravenous contrast.

[Series 2: cap with · axial · 0.72mm/px · z∈[-634,-124]mm · 10 of 126 slices shown, 12 images]
[im 12/126  mediastinal]
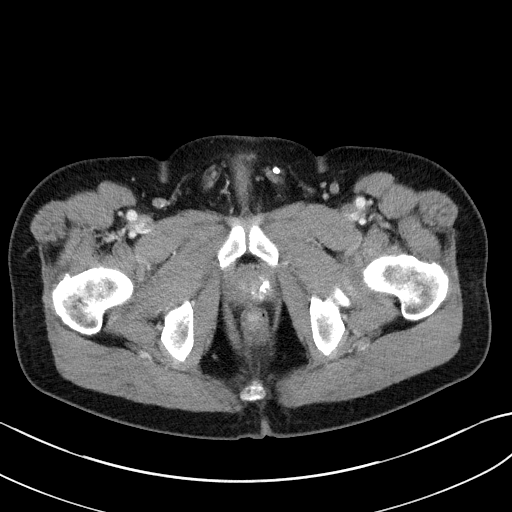
[im 12/126  bone]
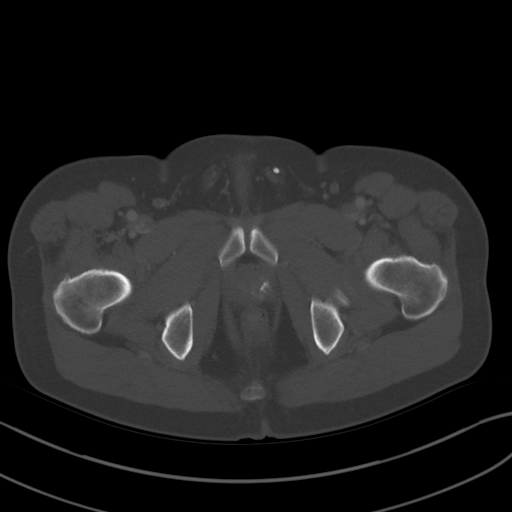
[im 23/126  mediastinal]
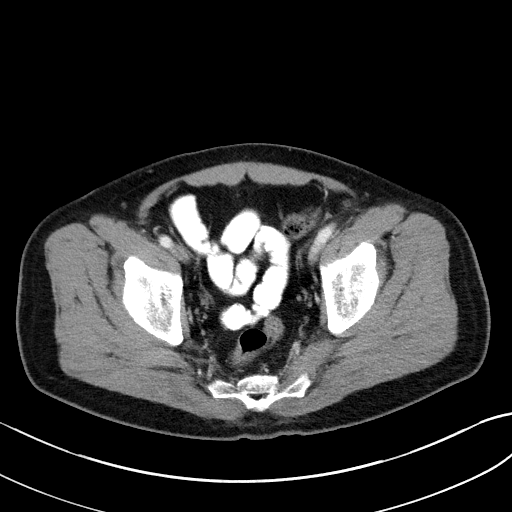
[im 35/126  mediastinal]
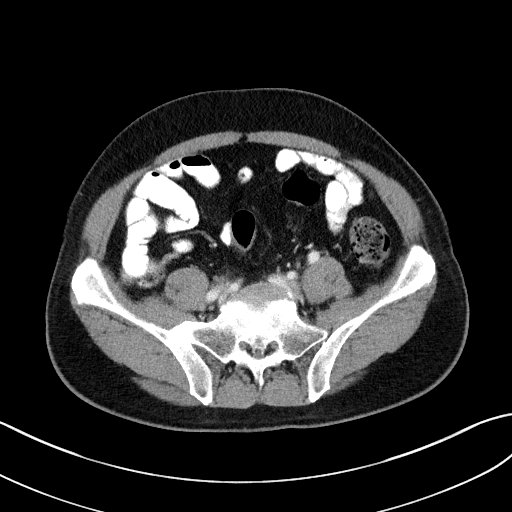
[im 46/126  mediastinal]
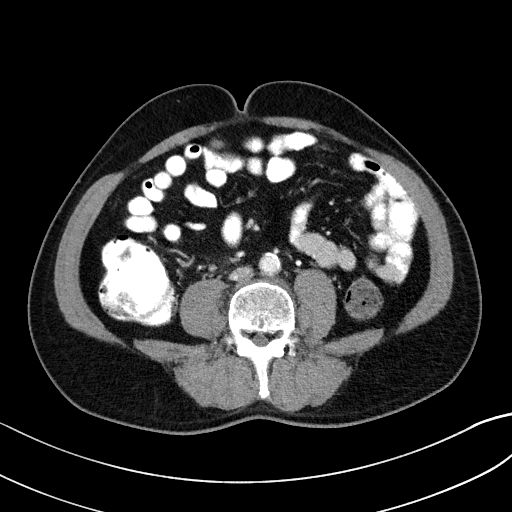
[im 57/126  mediastinal]
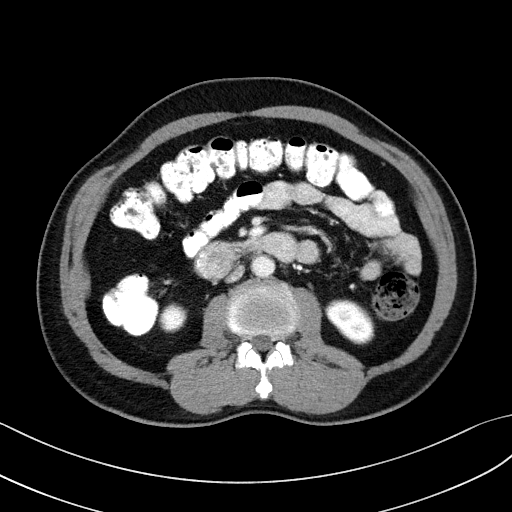
[im 69/126  mediastinal]
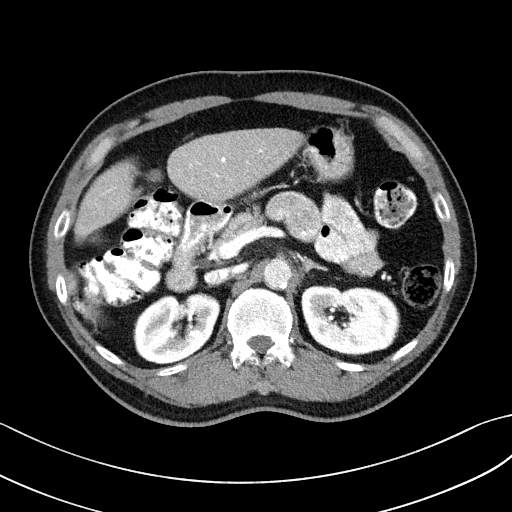
[im 80/126  mediastinal]
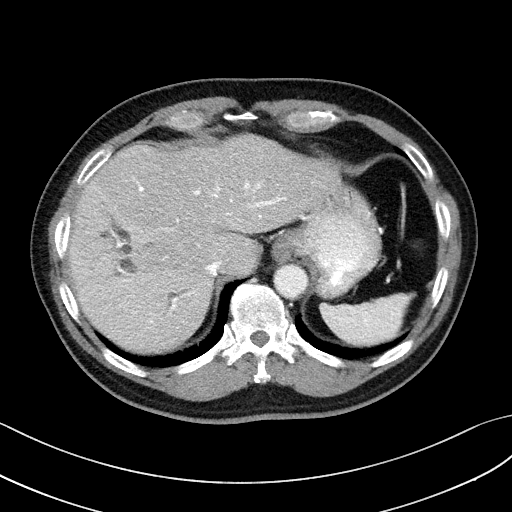
[im 91/126  mediastinal]
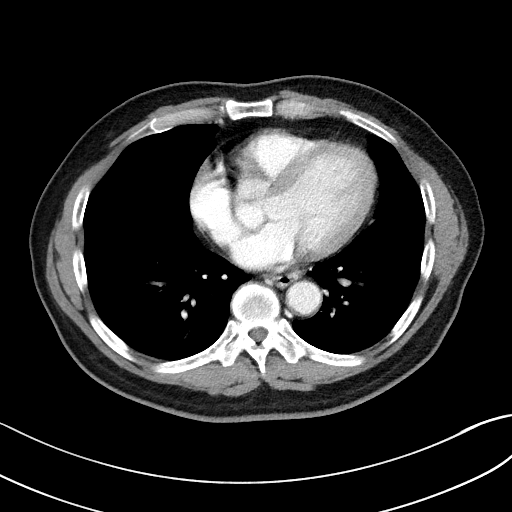
[im 103/126  mediastinal]
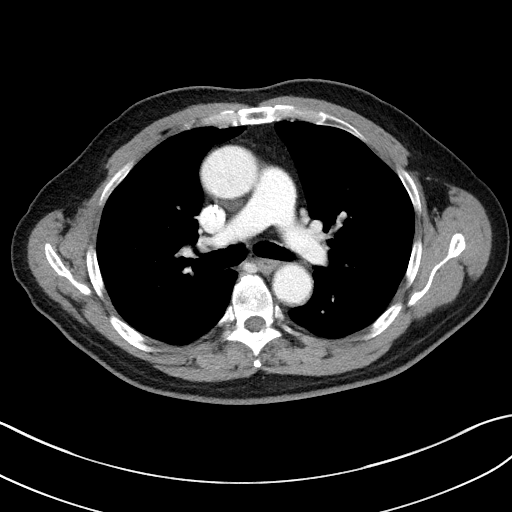
[im 103/126  bone]
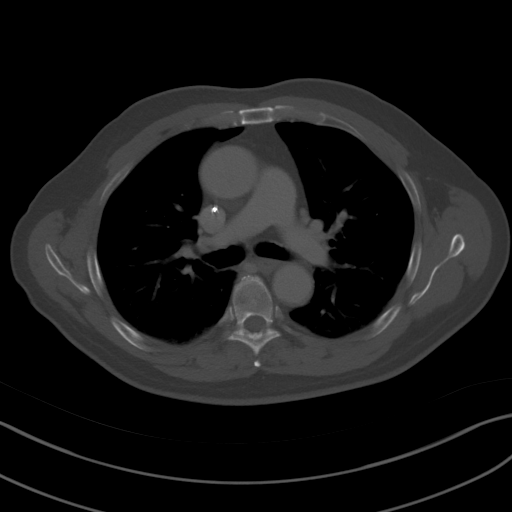
[im 114/126  mediastinal]
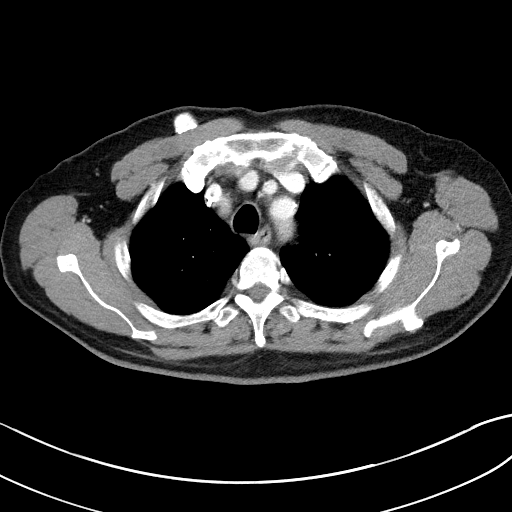

[Series 5: coronals · coronal · 0.71mm/px · 3 of 137 slices shown]
[im 28/137  mediastinal]
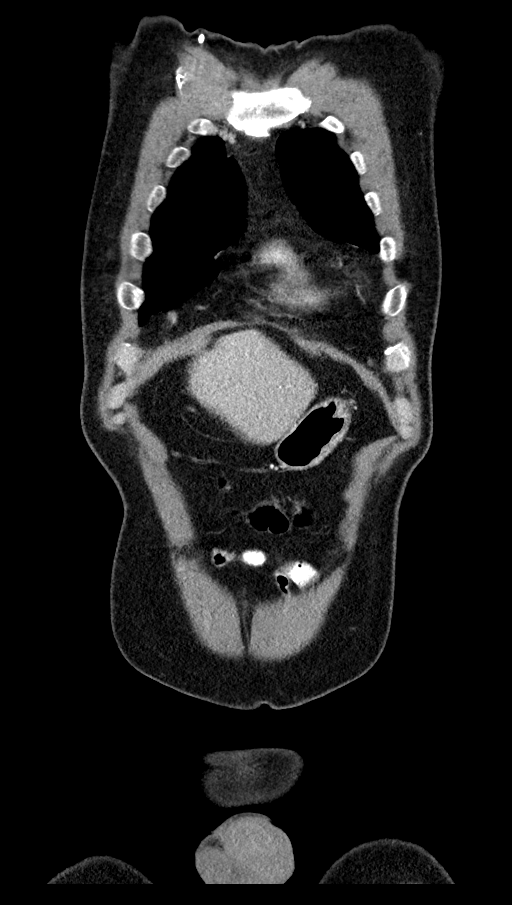
[im 55/137  mediastinal]
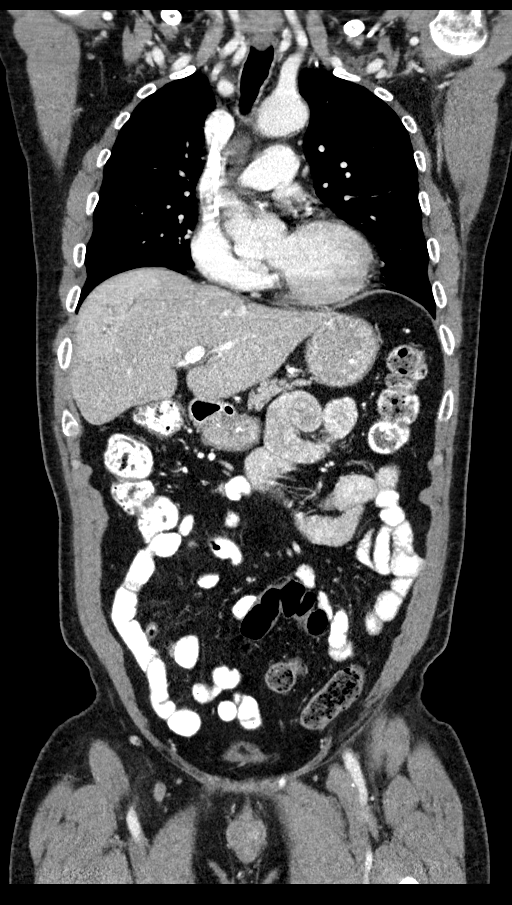
[im 82/137  mediastinal]
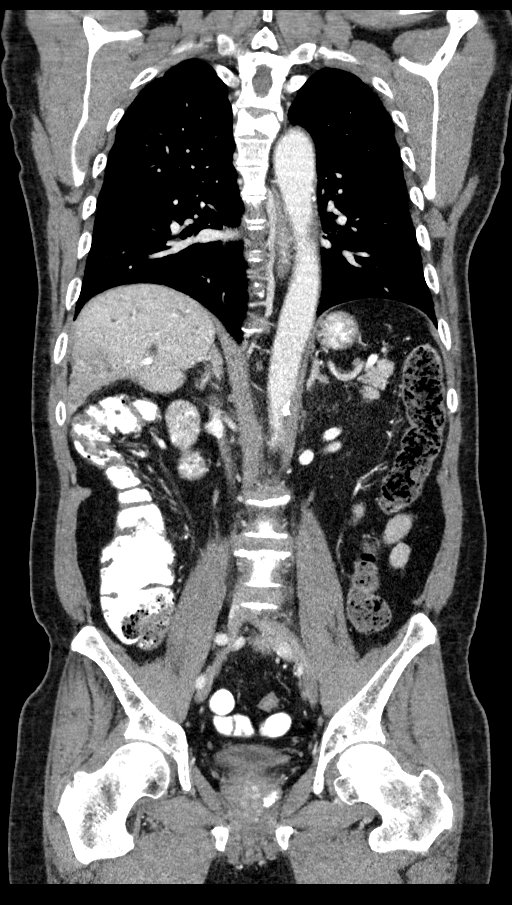

[13 of 36 positions shown; findings below may reference images not displayed]

RADIATION DOSE REDUCTION: This exam was performed according to the
departmental dose-optimization program which includes automated
exposure control, adjustment of the mA and/or kV according to
patient size and/or use of iterative reconstruction technique.

CONTRAST:  100mL OMNIPAQUE IOHEXOL 300 MG/ML  SOLN
FINDINGS: CT CHEST FINDINGS

Cardiovascular: The heart size is normal. No substantial pericardial
effusion. Coronary artery calcification is evident. Mild
atherosclerotic calcification is noted in the wall of the thoracic
aorta. Ascending thoracic aorta measures 4.0 cm diameter. Right
Port-A-Cath tip is positioned at the SVC/RA junction.

Mediastinum/Nodes: No mediastinal lymphadenopathy. There is no hilar
lymphadenopathy. The esophagus has normal imaging features. There is
no axillary lymphadenopathy.

Lungs/Pleura: No suspicious pulmonary nodule or mass. No focal
airspace consolidation. No pleural effusion.

Musculoskeletal: Compression deformity again noted at T4 with
increasing sclerosis.

CT ABDOMEN PELVIS FINDINGS

Hepatobiliary: Interval slight increase in intrahepatic biliary duct
dilatation involving the right liver. Ill-defined mass lesion
measured previously at 4.7 x 3.5 cm is now 5.3 x 3.5 cm. Posterior
component of this lesion measuring 2 cm today on image 55/2 was
approximally 1.2 cm (remeasured) previously. Gallbladder is
decompressed. No dilatation of the extrahepatic common duct.

Pancreas: No focal mass lesion. No dilatation of the main duct. No
intraparenchymal cyst. No peripancreatic edema.

Spleen: No splenomegaly. No focal mass lesion.

Adrenals/Urinary Tract: No adrenal nodule or mass. Kidneys
unremarkable. No evidence for hydroureter. The urinary bladder
appears normal for the degree of distention.

Stomach/Bowel: Stomach is unremarkable. No gastric wall thickening.
No evidence of outlet obstruction. Duodenum is normally positioned
as is the ligament of Treitz. No small bowel wall thickening. No
small bowel dilatation. The terminal ileum is normal. The appendix
is normal. No gross colonic mass. No colonic wall thickening.

Vascular/Lymphatic: There is mild atherosclerotic calcification of
the abdominal aorta without aneurysm. There is no gastrohepatic or
hepatoduodenal ligament lymphadenopathy. No retroperitoneal or
mesenteric lymphadenopathy. No pelvic sidewall lymphadenopathy.

Reproductive: The prostate gland and seminal vesicles are
unremarkable.

Other: No intraperitoneal free fluid.

Musculoskeletal: No worrisome lytic or sclerotic osseous
abnormality.
IMPRESSION: 1. Interval slight increase in intrahepatic biliary duct dilatation
involving the right liver. Peripheral ill-defined mass lesion
measures slightly larger in the interval.
2. No evidence for soft tissue metastatic disease in the chest,
abdomen, or pelvis.
3. Compression deformity at T4 with increasing sclerosis. Again,
this may reflect some healing of this pathologic fracture.
4. Aortic Atherosclerosis ([id number]).

## 2022-10-12 ENCOUNTER — Inpatient Hospital Stay (HOSPITAL_COMMUNITY)
Admission: EM | Admit: 2022-10-12 | Discharge: 2022-10-29 | DRG: 092 | Disposition: A | Discharge status: Skilled Nursing Facility | Payer: Medicare Other | Attending: Internal Medicine | Admitting: Internal Medicine

## 2022-10-12 ENCOUNTER — Emergency Department (HOSPITAL_COMMUNITY): Payer: Medicare Other

## 2022-10-12 DIAGNOSIS — R0789 Other chest pain: Secondary | ICD-10-CM | POA: Diagnosis present

## 2022-10-12 DIAGNOSIS — D72829 Elevated white blood cell count, unspecified: Secondary | ICD-10-CM

## 2022-10-12 DIAGNOSIS — G8220 Paraplegia, unspecified: Secondary | ICD-10-CM | POA: Diagnosis not present

## 2022-10-12 DIAGNOSIS — Z87891 Personal history of nicotine dependence: Secondary | ICD-10-CM

## 2022-10-12 DIAGNOSIS — C221 Intrahepatic bile duct carcinoma: Secondary | ICD-10-CM | POA: Diagnosis present

## 2022-10-12 DIAGNOSIS — G9529 Other cord compression: Principal | ICD-10-CM | POA: Diagnosis present

## 2022-10-12 DIAGNOSIS — E8809 Other disorders of plasma-protein metabolism, not elsewhere classified: Secondary | ICD-10-CM | POA: Diagnosis present

## 2022-10-12 DIAGNOSIS — G992 Myelopathy in diseases classified elsewhere: Secondary | ICD-10-CM | POA: Diagnosis present

## 2022-10-12 DIAGNOSIS — D7589 Other specified diseases of blood and blood-forming organs: Secondary | ICD-10-CM

## 2022-10-12 DIAGNOSIS — I2699 Other pulmonary embolism without acute cor pulmonale: Secondary | ICD-10-CM | POA: Diagnosis present

## 2022-10-12 DIAGNOSIS — R64 Cachexia: Secondary | ICD-10-CM | POA: Diagnosis present

## 2022-10-12 DIAGNOSIS — Z923 Personal history of irradiation: Secondary | ICD-10-CM

## 2022-10-12 DIAGNOSIS — C249 Malignant neoplasm of biliary tract, unspecified: Secondary | ICD-10-CM | POA: Diagnosis present

## 2022-10-12 DIAGNOSIS — C7951 Secondary malignant neoplasm of bone: Secondary | ICD-10-CM | POA: Diagnosis present

## 2022-10-12 DIAGNOSIS — Z6823 Body mass index (BMI) 23.0-23.9, adult: Secondary | ICD-10-CM

## 2022-10-12 DIAGNOSIS — Y92239 Unspecified place in hospital as the place of occurrence of the external cause: Secondary | ICD-10-CM | POA: Diagnosis not present

## 2022-10-12 DIAGNOSIS — G9520 Unspecified cord compression: Secondary | ICD-10-CM

## 2022-10-12 DIAGNOSIS — R7309 Other abnormal glucose: Secondary | ICD-10-CM

## 2022-10-12 DIAGNOSIS — R338 Other retention of urine: Secondary | ICD-10-CM

## 2022-10-12 DIAGNOSIS — R918 Other nonspecific abnormal finding of lung field: Secondary | ICD-10-CM

## 2022-10-12 DIAGNOSIS — E440 Moderate protein-calorie malnutrition: Secondary | ICD-10-CM | POA: Diagnosis present

## 2022-10-12 DIAGNOSIS — D61818 Other pancytopenia: Secondary | ICD-10-CM | POA: Diagnosis present

## 2022-10-12 DIAGNOSIS — G834 Cauda equina syndrome: Secondary | ICD-10-CM | POA: Diagnosis present

## 2022-10-12 DIAGNOSIS — T380X5A Adverse effect of glucocorticoids and synthetic analogues, initial encounter: Secondary | ICD-10-CM | POA: Diagnosis not present

## 2022-10-12 DIAGNOSIS — Z9049 Acquired absence of other specified parts of digestive tract: Secondary | ICD-10-CM

## 2022-10-12 DIAGNOSIS — R339 Retention of urine, unspecified: Secondary | ICD-10-CM | POA: Diagnosis present

## 2022-10-12 DIAGNOSIS — Z808 Family history of malignant neoplasm of other organs or systems: Secondary | ICD-10-CM

## 2022-10-12 DIAGNOSIS — R29898 Other symptoms and signs involving the musculoskeletal system: Secondary | ICD-10-CM | POA: Diagnosis present

## 2022-10-12 DIAGNOSIS — R160 Hepatomegaly, not elsewhere classified: Secondary | ICD-10-CM | POA: Diagnosis present

## 2022-10-12 DIAGNOSIS — Z7901 Long term (current) use of anticoagulants: Secondary | ICD-10-CM

## 2022-10-12 DIAGNOSIS — R7989 Other specified abnormal findings of blood chemistry: Secondary | ICD-10-CM | POA: Diagnosis present

## 2022-10-12 DIAGNOSIS — N179 Acute kidney failure, unspecified: Secondary | ICD-10-CM

## 2022-10-12 DIAGNOSIS — Z86711 Personal history of pulmonary embolism: Secondary | ICD-10-CM

## 2022-10-12 DIAGNOSIS — R7401 Elevation of levels of liver transaminase levels: Secondary | ICD-10-CM | POA: Diagnosis present

## 2022-10-12 DIAGNOSIS — R079 Chest pain, unspecified: Secondary | ICD-10-CM

## 2022-10-12 DIAGNOSIS — K5900 Constipation, unspecified: Secondary | ICD-10-CM | POA: Diagnosis present

## 2022-10-12 LAB — BASIC METABOLIC PANEL
Anion gap: 10 (ref 5–15)
BUN: 13 mg/dL (ref 8–23)
CO2: 27 mmol/L (ref 22–32)
Calcium: 9.3 mg/dL (ref 8.9–10.3)
Chloride: 102 mmol/L (ref 98–111)
Creatinine, Ser: 0.75 mg/dL (ref 0.61–1.24)
GFR, Estimated: >60 mL/min (ref >60)
Glucose, Bld: 118 mg/dL — ABNORMAL HIGH (ref 70–99)
Potassium: 4.1 mmol/L (ref 3.5–5.1)
Sodium: 139 mmol/L (ref 135–145)

## 2022-10-12 LAB — CBC WITH DIFFERENTIAL/PLATELET
Abs Immature Granulocytes: 0.02 10*3/uL (ref 0.00–0.07)
Basophils Absolute: 0 10*3/uL (ref 0.0–0.1)
Basophils Relative: 0 %
Eosinophils Absolute: 0.2 10*3/uL (ref 0.0–0.5)
Eosinophils Relative: 3 %
HCT: 43.3 % (ref 39.0–52.0)
Hemoglobin: 14.3 g/dL (ref 13.0–17.0)
Immature Granulocytes: 0 %
Lymphocytes Relative: 17 %
Lymphs Abs: 0.9 10*3/uL (ref 0.7–4.0)
MCH: 33.3 pg (ref 26.0–34.0)
MCHC: 33 g/dL (ref 30.0–36.0)
MCV: 100.7 fL — ABNORMAL HIGH (ref 80.0–100.0)
Monocytes Absolute: 0.8 10*3/uL (ref 0.1–1.0)
Monocytes Relative: 13 %
Neutro Abs: 3.7 10*3/uL (ref 1.7–7.7)
Neutrophils Relative %: 67 %
Platelets: 132 10*3/uL — ABNORMAL LOW (ref 150–400)
RBC: 4.3 MIL/uL (ref 4.22–5.81)
RDW: 15.4 % (ref 11.5–15.5)
WBC: 5.6 10*3/uL (ref 4.0–10.5)
nRBC: 0 % (ref 0.0–0.2)

## 2022-10-12 LAB — TROPONIN I (HIGH SENSITIVITY)
Troponin I (High Sensitivity): 4 ng/L (ref <18)
Troponin I (High Sensitivity): 5 ng/L (ref <18)

## 2022-10-12 MED ORDER — HYDROMORPHONE HCL 1 MG/ML IJ SOLN
1.0000 mg | Freq: Once | INTRAMUSCULAR | Status: AC
  Administered 2022-10-12: 1 mg via INTRAVENOUS
  Filled 2022-10-12: qty 1

## 2022-10-12 MED ORDER — OXYCODONE HCL 5 MG PO TABS
5.0000 mg | ORAL_TABLET | Freq: Once | ORAL | Status: AC
  Administered 2022-10-12: 5 mg via ORAL
  Filled 2022-10-12: qty 1

## 2022-10-12 MED ORDER — IOHEXOL 350 MG/ML SOLN
75.0000 mL | Freq: Once | INTRAVENOUS | Status: AC | PRN
  Administered 2022-10-12: 75 mL via INTRAVENOUS

## 2022-10-12 MED ORDER — GADOBUTROL 1 MMOL/ML IV SOLN
6.0000 mL | Freq: Once | INTRAVENOUS | Status: AC | PRN
  Administered 2022-10-12: 6 mL via INTRAVENOUS

## 2022-10-12 NOTE — ED Provider Notes (Signed)
Care assumed from [facility], patient  with known metastatic cholangiocarcinoma, atypical chest pain with negative workup, difficulty walking pending MRI scans of the spine.  MRI shows multiple expansile, contrast-enhancing masses of the upper thoracic spine with epidural extension causing encroachment on the thecal sac.  Moderate mass effect on the spinal cord at the T6 level.  I have independently viewed the images, and agree with radiologist's interpretation.  I have reviewed his office record from 09/21/2022 where it states that he had radiation treatment of the thoracic spine because of a T4 lesion.  Because of this, I am not certain that his T6 lesion would be able to be treated with radiation.  On my exam, he has flaccid paralysis of both legs but intact sensation.  He has been unable to walk for a week.  I am consulting neurosurgery to review the images to see if there is any need for emergent treatment.  I have discussed the case with Dr. Reatha Armour of neurosurgery service who has reviewed the images and records and is concerned that the T6 lesion is new and he needs to be evaluated as an inpatien with formal neurosurgery consultation as well as consultation from oncology and radiation oncology.  His radiation treatments had been done here, and radiation oncologist can assess whether the T6 lesion can be safely irradiated.  I had an extensive discussion with the patient and family with use of an interpreter and they wishes admission to be done here where he received his radiation therapy as opposed to [facility] [place] where he has been getting his chemotherapy I have discussed the case with Dr. Claria Dice of [facility], who agrees to admit the patient.  Results for orders placed or performed during the hospital encounter of 38/18/29  Basic metabolic panel  Result Value Ref Range   Sodium 139 135 - 145 mmol/L   Potassium 4.1 3.5 - 5.1 mmol/L   Chloride 102 98 - 111  mmol/L   CO2 27 22 - 32 mmol/L   Glucose, Bld 118 (H) 70 - 99 mg/dL   BUN 13 8 - 23 mg/dL   Creatinine, Ser 0.75 0.61 - 1.24 mg/dL   Calcium 9.3 8.9 - 10.3 mg/dL   GFR, Estimated >60 >60 mL/min   Anion gap 10 5 - 15  CBC with Differential  Result Value Ref Range   WBC 5.6 4.0 - 10.5 K/uL   RBC 4.30 4.22 - 5.81 MIL/uL   Hemoglobin 14.3 13.0 - 17.0 g/dL   HCT 43.3 39.0 - 52.0 %   MCV 100.7 (H) 80.0 - 100.0 fL   MCH 33.3 26.0 - 34.0 pg   MCHC 33.0 30.0 - 36.0 g/dL   RDW 15.4 11.5 - 15.5 %   Platelets 132 (L) 150 - 400 K/uL   nRBC 0.0 0.0 - 0.2 %   Neutrophils Relative % 67 %   Neutro Abs 3.7 1.7 - 7.7 K/uL   Lymphocytes Relative 17 %   Lymphs Abs 0.9 0.7 - 4.0 K/uL   Monocytes Relative 13 %   Monocytes Absolute 0.8 0.1 - 1.0 K/uL   Eosinophils Relative 3 %   Eosinophils Absolute 0.2 0.0 - 0.5 K/uL   Basophils Relative 0 %   Basophils Absolute 0.0 0.0 - 0.1 K/uL   Immature Granulocytes 0 %   Abs Immature Granulocytes 0.02 0.00 - 0.07 K/uL  Troponin I (High Sensitivity)  Result Value Ref Range   Troponin I (High Sensitivity) 4 <18 ng/L  Troponin I (High Sensitivity)  Result Value Ref Range   Troponin I (High Sensitivity) 5 <18 ng/L   MR THORACIC SPINE W WO CONTRAST  Result Date: 10/13/2022 CLINICAL DATA:  Osseous metastatic disease EXAM: MRI CERVICAL, THORACIC AND LUMBAR SPINE WITHOUT AND WITH CONTRAST TECHNIQUE: Multiplanar and multiecho pulse sequences of the cervical spine, to include the craniocervical junction and cervicothoracic junction, and thoracic and lumbar spine, were obtained without and with intravenous contrast. CONTRAST:  82m GADAVIST GADOBUTROL 1 MMOL/ML IV SOLN COMPARISON:  None available FINDINGS: MRI CERVICAL SPINE FINDINGS Alignment: Normal Vertebrae: No fracture, evidence of discitis, or bone lesion. Cord: Normal signal and morphology. Posterior Fossa, vertebral arteries, paraspinal tissues: Negative Disc levels: C1-2: Unremarkable. C2-3: Small central disc  protrusion. There is no spinal canal stenosis. No neural foraminal stenosis. C3-4: Normal disc space and facet joints. There is no spinal canal stenosis. No neural foraminal stenosis. C4-5: Normal disc space and facet joints. There is no spinal canal stenosis. No neural foraminal stenosis. C5-6: Small disc bulge. There is no spinal canal stenosis. Mild left neural foraminal stenosis. C6-7: Normal disc space and facet joints. There is no spinal canal stenosis. No neural foraminal stenosis. C7-T1: Normal disc space and facet joints. There is no spinal canal stenosis. No neural foraminal stenosis. MRI THORACIC SPINE FINDINGS Alignment:  Physiologic. Vertebrae: There are multiple expansile, contrast-enhancing masses. There are circumferential lesions at T2 and T3 that encroach on the spinal canal, particularly at right T3. There is a similar lesion of T6 that also encroaches on the spinal canal and causes severe stenosis of the thecal sac. Mild T4 compression deformity. Edema at T4 has resolved. Cord:  Normal signal and morphology. Paraspinal and other soft tissues: Pulmonary nodules are better assessed on the earlier CT. Disc levels: The thecal sac is effaced at the T2, T3 and T6 levels. There is moderate mass effect on the spinal cord at the T6 level. MRI LUMBAR SPINE FINDINGS Segmentation:  Standard. Alignment:  Normal Vertebrae: No fracture, evidence of discitis, or bone lesion. L1 hemangioma. Conus medullaris and cauda equina: Conus extends to the L1 level. Conus and cauda equina appear normal. Paraspinal and other soft tissues: Negative Disc levels: L1-L2: Normal disc space and facet joints. No spinal canal stenosis. No neural foraminal stenosis. L2-L3: Normal disc space and facet joints. No spinal canal stenosis. No neural foraminal stenosis. L3-L4: Normal disc space and facet joints. No spinal canal stenosis. No neural foraminal stenosis. L4-L5: Left asymmetric disc bulge with narrowing of left lateral recess  and posterior displacement of the left L5 nerve root. No central spinal canal stenosis. No neural foraminal stenosis. L5-S1: Left asymmetric disc bulge with mild left lateral recess narrowing and posterior displacement of the left S1 nerve root. No spinal canal stenosis. No neural foraminal stenosis. Visualized sacrum: Normal. IMPRESSION: 1. Multiple expansile, contrast-enhancing masses of the upper thoracic spine with epidural extension causing encroachment on the thecal sac. Moderate mass effect on the spinal cord at the T6 level 2. No metastatic disease of the cervical or lumbar spine. 3. Left lateral recess narrowing at L4-5 and L5-S1 with posterior displacement of the left L5 and S1 nerve roots. 4. Resolution of edema at T4 without progression of height loss. Electronically Signed   By: KUlyses JarredM.D.   On: 10/13/2022 00:20   MR Lumbar Spine W Wo Contrast  Result Date: 10/13/2022 CLINICAL DATA:  Osseous metastatic disease EXAM: MRI CERVICAL, THORACIC AND LUMBAR SPINE WITHOUT AND WITH CONTRAST TECHNIQUE: Multiplanar  and multiecho pulse sequences of the cervical spine, to include the craniocervical junction and cervicothoracic junction, and thoracic and lumbar spine, were obtained without and with intravenous contrast. CONTRAST:  79m GADAVIST GADOBUTROL 1 MMOL/ML IV SOLN COMPARISON:  None available FINDINGS: MRI CERVICAL SPINE FINDINGS Alignment: Normal Vertebrae: No fracture, evidence of discitis, or bone lesion. Cord: Normal signal and morphology. Posterior Fossa, vertebral arteries, paraspinal tissues: Negative Disc levels: C1-2: Unremarkable. C2-3: Small central disc protrusion. There is no spinal canal stenosis. No neural foraminal stenosis. C3-4: Normal disc space and facet joints. There is no spinal canal stenosis. No neural foraminal stenosis. C4-5: Normal disc space and facet joints. There is no spinal canal stenosis. No neural foraminal stenosis. C5-6: Small disc bulge. There is no spinal canal  stenosis. Mild left neural foraminal stenosis. C6-7: Normal disc space and facet joints. There is no spinal canal stenosis. No neural foraminal stenosis. C7-T1: Normal disc space and facet joints. There is no spinal canal stenosis. No neural foraminal stenosis. MRI THORACIC SPINE FINDINGS Alignment:  Physiologic. Vertebrae: There are multiple expansile, contrast-enhancing masses. There are circumferential lesions at T2 and T3 that encroach on the spinal canal, particularly at right T3. There is a similar lesion of T6 that also encroaches on the spinal canal and causes severe stenosis of the thecal sac. Mild T4 compression deformity. Edema at T4 has resolved. Cord:  Normal signal and morphology. Paraspinal and other soft tissues: Pulmonary nodules are better assessed on the earlier CT. Disc levels: The thecal sac is effaced at the T2, T3 and T6 levels. There is moderate mass effect on the spinal cord at the T6 level. MRI LUMBAR SPINE FINDINGS Segmentation:  Standard. Alignment:  Normal Vertebrae: No fracture, evidence of discitis, or bone lesion. L1 hemangioma. Conus medullaris and cauda equina: Conus extends to the L1 level. Conus and cauda equina appear normal. Paraspinal and other soft tissues: Negative Disc levels: L1-L2: Normal disc space and facet joints. No spinal canal stenosis. No neural foraminal stenosis. L2-L3: Normal disc space and facet joints. No spinal canal stenosis. No neural foraminal stenosis. L3-L4: Normal disc space and facet joints. No spinal canal stenosis. No neural foraminal stenosis. L4-L5: Left asymmetric disc bulge with narrowing of left lateral recess and posterior displacement of the left L5 nerve root. No central spinal canal stenosis. No neural foraminal stenosis. L5-S1: Left asymmetric disc bulge with mild left lateral recess narrowing and posterior displacement of the left S1 nerve root. No spinal canal stenosis. No neural foraminal stenosis. Visualized sacrum: Normal. IMPRESSION:  1. Multiple expansile, contrast-enhancing masses of the upper thoracic spine with epidural extension causing encroachment on the thecal sac. Moderate mass effect on the spinal cord at the T6 level 2. No metastatic disease of the cervical or lumbar spine. 3. Left lateral recess narrowing at L4-5 and L5-S1 with posterior displacement of the left L5 and S1 nerve roots. 4. Resolution of edema at T4 without progression of height loss. Electronically Signed   By: KUlyses JarredM.D.   On: 10/13/2022 00:20   MR CERVICAL SPINE W WO CONTRAST  Result Date: 10/13/2022 CLINICAL DATA:  Osseous metastatic disease EXAM: MRI CERVICAL, THORACIC AND LUMBAR SPINE WITHOUT AND WITH CONTRAST TECHNIQUE: Multiplanar and multiecho pulse sequences of the cervical spine, to include the craniocervical junction and cervicothoracic junction, and thoracic and lumbar spine, were obtained without and with intravenous contrast. CONTRAST:  678mGADAVIST GADOBUTROL 1 MMOL/ML IV SOLN COMPARISON:  None available FINDINGS: MRI CERVICAL SPINE FINDINGS Alignment: Normal Vertebrae:  No fracture, evidence of discitis, or bone lesion. Cord: Normal signal and morphology. Posterior Fossa, vertebral arteries, paraspinal tissues: Negative Disc levels: C1-2: Unremarkable. C2-3: Small central disc protrusion. There is no spinal canal stenosis. No neural foraminal stenosis. C3-4: Normal disc space and facet joints. There is no spinal canal stenosis. No neural foraminal stenosis. C4-5: Normal disc space and facet joints. There is no spinal canal stenosis. No neural foraminal stenosis. C5-6: Small disc bulge. There is no spinal canal stenosis. Mild left neural foraminal stenosis. C6-7: Normal disc space and facet joints. There is no spinal canal stenosis. No neural foraminal stenosis. C7-T1: Normal disc space and facet joints. There is no spinal canal stenosis. No neural foraminal stenosis. MRI THORACIC SPINE FINDINGS Alignment:  Physiologic. Vertebrae: There are  multiple expansile, contrast-enhancing masses. There are circumferential lesions at T2 and T3 that encroach on the spinal canal, particularly at right T3. There is a similar lesion of T6 that also encroaches on the spinal canal and causes severe stenosis of the thecal sac. Mild T4 compression deformity. Edema at T4 has resolved. Cord:  Normal signal and morphology. Paraspinal and other soft tissues: Pulmonary nodules are better assessed on the earlier CT. Disc levels: The thecal sac is effaced at the T2, T3 and T6 levels. There is moderate mass effect on the spinal cord at the T6 level. MRI LUMBAR SPINE FINDINGS Segmentation:  Standard. Alignment:  Normal Vertebrae: No fracture, evidence of discitis, or bone lesion. L1 hemangioma. Conus medullaris and cauda equina: Conus extends to the L1 level. Conus and cauda equina appear normal. Paraspinal and other soft tissues: Negative Disc levels: L1-L2: Normal disc space and facet joints. No spinal canal stenosis. No neural foraminal stenosis. L2-L3: Normal disc space and facet joints. No spinal canal stenosis. No neural foraminal stenosis. L3-L4: Normal disc space and facet joints. No spinal canal stenosis. No neural foraminal stenosis. L4-L5: Left asymmetric disc bulge with narrowing of left lateral recess and posterior displacement of the left L5 nerve root. No central spinal canal stenosis. No neural foraminal stenosis. L5-S1: Left asymmetric disc bulge with mild left lateral recess narrowing and posterior displacement of the left S1 nerve root. No spinal canal stenosis. No neural foraminal stenosis. Visualized sacrum: Normal. IMPRESSION: 1. Multiple expansile, contrast-enhancing masses of the upper thoracic spine with epidural extension causing encroachment on the thecal sac. Moderate mass effect on the spinal cord at the T6 level 2. No metastatic disease of the cervical or lumbar spine. 3. Left lateral recess narrowing at L4-5 and L5-S1 with posterior displacement of  the left L5 and S1 nerve roots. 4. Resolution of edema at T4 without progression of height loss. Electronically Signed   By: Ulyses Jarred M.D.   On: 10/13/2022 00:20   CT Angio Chest PE W/Cm &/Or Wo Cm  Result Date: 10/12/2022 CLINICAL DATA:  History of cholangiocarcinoma with right-sided chest pain radiating into the back EXAM: CT ANGIOGRAPHY CHEST WITH CONTRAST TECHNIQUE: Multidetector CT imaging of the chest was performed using the standard protocol during bolus administration of intravenous contrast. Multiplanar CT image reconstructions and MIPs were obtained to evaluate the vascular anatomy. RADIATION DOSE REDUCTION: This exam was performed according to the departmental dose-optimization program which includes automated exposure control, adjustment of the mA and/or kV according to patient size and/or use of iterative reconstruction technique. CONTRAST:  59m OMNIPAQUE IOHEXOL 350 MG/ML SOLN COMPARISON:  CT chest dated 10/26/2021 FINDINGS: Cardiovascular: The study is high quality for the evaluation of pulmonary embolism. There are  no filling defects in the central, lobar, segmental or subsegmental pulmonary artery branches to suggest acute pulmonary embolism. Ascending aorta measures 4.1 cm, unchanged. Right chest wall port terminates at the superior cavoatrial junction. Normal heart size. No significant pericardial fluid/thickening. Coronary artery calcifications and aortic atherosclerosis. Mediastinum/Nodes: Imaged thyroid gland without nodules meeting criteria for imaging follow-up by size. Normal esophagus. Unchanged 10 mm right hilar lymph node (5:62). Lungs/Pleura: The central airways are patent. Scattered pulmonary nodules are new or slightly increased in size, for example left upper lobe 6 x 5 mm nodule (6:29), previously 2 mm, subpleural left upper lobe nodule measuring 5 x 4 mm (6:67), previously 2 mm, and subpleural 4 x 4 mm right lower lobe nodule (6:60). New 2 mm subpleural left upper lobe  ground-glass nodule (6:36) and 3 mm left lower lobe nodule (6:89). Unchanged left lower lobe 3 mm perifissural nodule (6:74). No focal consolidation. No pneumothorax. No pleural effusion. Upper abdomen: Increased atrophic appearance of the right hepatic lobe. Additional multifocal hypoattenuating foci measuring up to 3.4 cm in segment 2/3 (5:128) are new. Musculoskeletal: Lytic and sclerotic appearance of T2 and T6 and right transverse process of T3 are new. Similar compression deformity of T4 with increased lytic appearance. Review of the MIP images confirms the above findings. IMPRESSION: 1. No acute pulmonary embolism. 2. New and increased size of scattered pulmonary nodules, suspicious for metastatic disease. 3. New multifocal hypoattenuating foci in the liver, suspicious for metastatic disease. 4. New lytic and sclerotic appearance of T2 and T6 and right transverse process of T3, suspicious for osseous metastatic disease. Similar compression deformity of T4 with increased lytic appearance. 5. Coronary artery calcifications. Aortic Atherosclerosis (ICD10-I70.0). Electronically Signed   By: Darrin Nipper M.D.   On: 10/12/2022 14:21   DG Chest 2 View  Result Date: 10/12/2022 CLINICAL DATA:  Chest pain. EXAM: CHEST - 2 VIEW COMPARISON:  May 13, 2021. FINDINGS: The heart size and mediastinal contours are within normal limits. Both lungs are clear. Right internal jugular Port-A-Cath is noted with distal tip in expected position of the SVC. The visualized skeletal structures are unremarkable. IMPRESSION: No active cardiopulmonary disease. Electronically Signed   By: Marijo Conception M.D.   On: 54/62/7035 00:93        Delora Fuel, MD 81/82/99 [phone]

## 2022-10-12 NOTE — ED Triage Notes (Addendum)
Pt BIB [facility] from home d/t Rt sided CP that radiates into his back. This has been coming & going for the past month but today it worsened to the point of calling EMS. 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 EMS reports 12 L was good, 324 ASA given PO, 160/88, 68 bpm, Resp 16, 98% on RA, CBG 106.

## 2022-10-12 NOTE — ED Provider Notes (Signed)
[facility] EMERGENCY DEPARTMENT Provider Note   CSN: [id number] Arrival date & time: 10/12/22  1042     History  Chief Complaint  Patient presents with   Chest Pain   CA    Jason Lyons is a 66 y.o. male.   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  Patient states the chest pain has been going on for about a month, it is intermittent.  Unable to defy provoking features.  There is no nausea, vomiting, mopped assist.  He is on Eliquis due to a previous PE, he has not missed any doses.  He has been seen for this twice previously in December with a negative PE study on 09/21/2022.    Home Medications Prior to Admission medications   Medication Sig Start Date End Date Taking? Authorizing Provider  apixaban (ELIQUIS) 5 MG TABS tablet Take 1 tablet by mouth 2 times daily. 09/18/21   Heath Lark, MD  cholecalciferol (VITAMIN D3) 25 MCG (1000 UNIT) tablet Take 1,000 Units by mouth daily.    [provider]  oxyCODONE (OXY IR/ROXICODONE) 5 MG immediate release tablet Take 1 tablet (5 mg total) by mouth every 4 hours as needed for severe pain. 07/17/21   Heath Lark, MD  prochlorperazine (COMPAZINE) 10 MG tablet Take 1 tablet by mouth every 6 hours as needed (Nausea or vomiting). 05/21/21 11/01/21  Heath Lark, MD      Allergies    Patient has no known allergies.    Review of Systems   Review of Systems  Cardiovascular:  Positive for chest pain.    Physical Exam Updated Vital Signs BP (!) 143/105   Pulse 66   Temp 98 F (36.7 C) (Oral)   Resp 17   SpO2 100%  Physical Exam Vitals and nursing note reviewed. Exam conducted with a chaperone present.  Constitutional:      Appearance: Normal appearance.     Comments: Cachectic  HENT:     Head: Normocephalic and atraumatic.  Eyes:     General: No  scleral icterus.       Right eye: No discharge.        Left eye: No discharge.     Extraocular Movements: Extraocular movements intact.     Pupils: Pupils are equal, round, and reactive to light.  Cardiovascular:     Rate and Rhythm: Normal rate and regular rhythm.     Pulses: Normal pulses.     Heart sounds: Normal heart sounds.     No friction rub. No gallop.  Pulmonary:     Effort: Pulmonary effort is normal. No respiratory distress.     Breath sounds: Normal breath sounds.  Abdominal:     General: Abdomen is flat. Bowel sounds are normal. There is no distension.     Palpations: Abdomen is soft.     Tenderness: There is no abdominal tenderness.  Skin:    General: Skin is warm and dry.     Coloration: Skin is not jaundiced.  Neurological:     Mental Status: He is alert. Mental status is at baseline.     Coordination: Coordination normal.     Comments: Cranial nerves II through XII are grossly intact.  Grip strength is symmetric bilaterally.  Patient is unable to raise either lower extremity against gravity, unable to plantarflex or dorsiflex.  ED Results / Procedures / Treatments   Labs (all labs ordered are listed, but only abnormal results are displayed) Labs Reviewed  BASIC METABOLIC PANEL - Abnormal; Notable for the following components:      Result Value   Glucose, Bld 118 (*)    All other components within normal limits  CBC WITH DIFFERENTIAL/PLATELET - Abnormal; Notable for the following components:   MCV 100.7 (*)    Platelets 132 (*)    All other components within normal limits  TROPONIN I (HIGH SENSITIVITY)  TROPONIN I (HIGH SENSITIVITY)    EKG EKG Interpretation  Date/Time:  Tuesday October 12 2022 11:00:33 EST Ventricular Rate:  65 PR Interval:  160 QRS Duration: 86 QT Interval:  390 QTC Calculation: 405 R Axis:   2 Text Interpretation: Normal sinus rhythm Normal ECG When compared with ECG of 13-May-2021 11:13, PREVIOUS ECG IS PRESENT since last  tracing no significant change Confirmed by Malvin Johns [phone]) on 10/12/2022 11:31:54 AM  Radiology CT Angio Chest PE W/Cm &/Or Wo Cm  Result Date: 10/12/2022 CLINICAL DATA:  History of cholangiocarcinoma with right-sided chest pain radiating into the back EXAM: CT ANGIOGRAPHY CHEST WITH CONTRAST TECHNIQUE: Multidetector CT imaging of the chest was performed using the standard protocol during bolus administration of intravenous contrast. Multiplanar CT image reconstructions and MIPs were obtained to evaluate the vascular anatomy. RADIATION DOSE REDUCTION: This exam was performed according to the departmental dose-optimization program which includes automated exposure control, adjustment of the mA and/or kV according to patient size and/or use of iterative reconstruction technique. CONTRAST:  80m OMNIPAQUE IOHEXOL 350 MG/ML SOLN COMPARISON:  CT chest dated 10/26/2021 FINDINGS: Cardiovascular: The study is high quality for the evaluation of pulmonary embolism. There are no filling defects in the central, lobar, segmental or subsegmental pulmonary artery branches to suggest acute pulmonary embolism. Ascending aorta measures 4.1 cm, unchanged. Right chest wall port terminates at the superior cavoatrial junction. Normal heart size. No significant pericardial fluid/thickening. Coronary artery calcifications and aortic atherosclerosis. Mediastinum/Nodes: Imaged thyroid gland without nodules meeting criteria for imaging follow-up by size. Normal esophagus. Unchanged 10 mm right hilar lymph node (5:62). Lungs/Pleura: The central airways are patent. Scattered pulmonary nodules are new or slightly increased in size, for example left upper lobe 6 x 5 mm nodule (6:29), previously 2 mm, subpleural left upper lobe nodule measuring 5 x 4 mm (6:67), previously 2 mm, and subpleural 4 x 4 mm right lower lobe nodule (6:60). New 2 mm subpleural left upper lobe ground-glass nodule (6:36) and 3 mm left lower lobe nodule (6:89).  Unchanged left lower lobe 3 mm perifissural nodule (6:74). No focal consolidation. No pneumothorax. No pleural effusion. Upper abdomen: Increased atrophic appearance of the right hepatic lobe. Additional multifocal hypoattenuating foci measuring up to 3.4 cm in segment 2/3 (5:128) are new. Musculoskeletal: Lytic and sclerotic appearance of T2 and T6 and right transverse process of T3 are new. Similar compression deformity of T4 with increased lytic appearance. Review of the MIP images confirms the above findings. IMPRESSION: 1. No acute pulmonary embolism. 2. New and increased size of scattered pulmonary nodules, suspicious for metastatic disease. 3. New multifocal hypoattenuating foci in the liver, suspicious for metastatic disease. 4. New lytic and sclerotic appearance of T2 and T6 and right transverse process of T3, suspicious for osseous metastatic disease. Similar compression deformity of T4 with increased lytic appearance. 5. Coronary artery calcifications. Aortic Atherosclerosis (ICD10-I70.0). Electronically Signed   By: LDarrin NipperM.D.   On: 10/12/2022 14:21  DG Chest 2 View  Result Date: 10/12/2022 CLINICAL DATA:  Chest pain. EXAM: CHEST - 2 VIEW COMPARISON:  May 13, 2021. FINDINGS: The heart size and mediastinal contours are within normal limits. Both lungs are clear. Right internal jugular Port-A-Cath is noted with distal tip in expected position of the SVC. The visualized skeletal structures are unremarkable. IMPRESSION: No active cardiopulmonary disease. Electronically Signed   By: Marijo Conception M.D.   On: 10/12/2022 11:48    Procedures Procedures    Medications Ordered in ED Medications  oxyCODONE (Oxy IR/ROXICODONE) immediate release tablet 5 mg (5 mg Oral Given 10/12/22 1300)  iohexol (OMNIPAQUE) 350 MG/ML injection 75 mL (75 mLs Intravenous Contrast Given 10/12/22 1354)    ED Course/ Medical Decision Making/ A&P Clinical Course as of 10/12/22 1505  Tue Oct 12, 2022  1203 CBC with  Differential(!) No leukocytosis.  Mildly thrombocytopenic with a platelet count of 132.  MCV slightly increased.  Not anemic. [HS]  1203 ED EKG Normal sinus rhythm.  No specific ischemic changes.  Patient is in sinus rhythm on cardiac monitoring. [HS]  0354 Basic metabolic panel(!) No gross electrolyte derangement, mildly hyperglycemic at 118 [HS]  1409 Troponin I (High Sensitivity) Flat troponin 4---5 [HS]  1409 DG Chest 2 View No acute process  [HS]  1409 I reevaluated patient after pain medicine, pain mildly improved. [HS]  6568 I reevaluated the patient, patient is now saying he is having weakness and numbness to his upper and lower extremities and for the last 3 to 4 days does not feel like he can walk. The lower extremities feel weak and he can't raise them. Given concerns for metastasis on CT PE study and oncologic history we will proceed with MRI cervical spine, thoracic spine and lumbar spine. [HS]    Clinical Course User Index [HS] Sherrill Raring, PA-C                           Medical Decision Making Amount and/or Complexity of Data Reviewed Labs: ordered. Decision-making details documented in ED Course. Radiology: ordered. Decision-making details documented in ED Course. ECG/medicine tests:  Decision-making details documented in ED Course.  Risk Prescription drug management.   Patient presents due to chest pain.  Differential includes but limited to ACS, PE, pneumonia, pneumothorax, metastasis.  I ordered, viewed and interpreted laboratory workup as documented ED course.  Given no ischemic change on EKG, negative delta troponin I do not think this is ACS.  Patient CT PSA any is negative for PE.  Heart score is 3, from a cardiac standpoint I do not think he needs additional evaluation.  Patient discussed with attending who also evaluate the patient.  He verbalized numbness and tingling and weakness to his upper and lower extremities with bilateral lower extremity weakness  worsening over the last 3 to 4 days to the point where he is unable to ambulate.  Also having low back pain.  This was not addressed prior to the cardiac workup being initiated.  Given this and the worsening metastatic disease we will proceed with spinal imaging with and without contrast to evaluate for cord compression.  Case discussed with oncoming team PA [place]. See their note for disposition..  Discussed HPI, physical exam and plan of care for this patient with attending Malvin Johns. The attending physician evaluated this patient as part of a shared visit and agrees with plan of care.  Final Clinical Impression(s) / ED Diagnoses Final diagnoses:  Nonspecific chest pain  Pulmonary nodules    Rx / DC Orders ED Discharge Orders     None         Sherrill Raring, PA-C 10/12/22 1505    Malvin Johns, MD 10/12/22 1559

## 2022-10-12 NOTE — Discharge Instructions (Addendum)
     1. Khng thuyn t?c ph?i c?p. 2. Kch th??c m?i v t?ng c?a cc n?t ph?i r?i rc, nghi ng? ??i v?i b?nh di c?n. 3. Cc ? gi?m suy gi?m ?a ? m?i trong gan, nghi ng? cho b?nh di c?n. 4. S? xu?t hi?n lytic v x? c?ng m?i c?a T2 v T6 v ph?i qu trnh ngang c?a T3, nghi ng? b?nh di c?n x??ng. Bi?n d?ng nn t??ng t? c?a T4 v?i s? xu?t hi?n lytic t?ng ln. 5. Vi ha ??ng m?ch vnh. X? v?a ??ng m?ch ch?    IMPRESSION:  1. No acute pulmonary embolism.  2. New and increased size of scattered pulmonary nodules, suspicious  for metastatic disease.  3. New multifocal hypoattenuating foci in the liver, suspicious for  metastatic disease.  4. New lytic and sclerotic appearance of T2 and T6 and right  transverse process of T3, suspicious for osseous metastatic disease.  Similar compression deformity of T4 with increased lytic appearance.  5. Coronary artery calcifications. Aortic Atherosclerosis

## 2022-10-12 NOTE — ED Provider Notes (Signed)
Patient care taken over from [place], [place] at shift handoff  In short, 67 year old male patient with history of cholangiocarcinoma with metastasis to bone and liver currently on chemotherapy and followed by [facility] presenting to the emergency department due to chest pain.  The chest pain was reportedly ongoing for over a month and is intermittent in nature.  Patient was unable to define any aggravating or alleviating features.  He denies any nausea, vomiting, shortness of breath.  The patient is on Eliquis due to previous PE and denies missing any doses.  He has been seen twice for the same pain over the course of the month of December with a negative PE study on December 12 of last year.  Workup prior to me taking on patient care included CT PE study which was negative for PE, low heart score of 3, negative delta troponins, no ischemic changes on EKG.  No further plans for cardiac workup in the emergent setting  Patient also endorsed some numbness and tingling in the upper and lower extremities with some bilateral lower extremity weakness which is existed for the past month but has worsened over the last 3 to 4 days to the point where the patient has difficulty with ambulation.  This was brought up after the initial workup was complete.  Spinal imaging, MR, ordered. Physical Exam  BP 116/67   Pulse 60   Temp 98.1 F (36.7 C) (Oral)   Resp 18   SpO2 100%   Physical Exam  Procedures  Procedures  ED Course / MDM   Clinical Course as of 10/12/22 2032  Tue Oct 12, 2022  1203 CBC with Differential(!) No leukocytosis.  Mildly thrombocytopenic with a platelet count of 132.  MCV slightly increased.  Not anemic. [HS]  1203 ED EKG Normal sinus rhythm.  No specific ischemic changes.  Patient is in sinus rhythm on cardiac monitoring. [HS]  5465 Basic metabolic panel(!) No gross electrolyte derangement, mildly hyperglycemic at 118 [HS]  1409 Troponin I (High Sensitivity) Flat troponin 4---5 [HS]   1409 DG Chest 2 View No acute process  [HS]  1409 I reevaluated patient after pain medicine, pain mildly improved. [HS]  6812 I reevaluated the patient, patient is now saying he is having weakness and numbness to his upper and lower extremities and for the last 3 to 4 days does not feel like he can walk. The lower extremities feel weak and he can't raise them. Given concerns for metastasis on CT PE study and oncologic history we will proceed with MRI cervical spine, thoracic spine and lumbar spine. [HS]    Clinical Course User Index [HS] Sherrill Raring, PA-C   Medical Decision Making Amount and/or Complexity of Data Reviewed Labs: ordered. Decision-making details documented in ED Course. Radiology: ordered. Decision-making details documented in ED Course. ECG/medicine tests:  Decision-making details documented in ED Course.  Risk Prescription drug management.   Patient disposition continuing to pend MRI results. Patient care being transferred to [facility] at shift handoff       Ronny Bacon 10/12/22 2321    Sherwood Gambler, MD 10/13/22 1534

## 2022-10-13 ENCOUNTER — Ambulatory Visit: Payer: Medicare Other | Admitting: Radiation Oncology

## 2022-10-13 ENCOUNTER — Other Ambulatory Visit: Payer: Self-pay

## 2022-10-13 ENCOUNTER — Ambulatory Visit
Admit: 2022-10-13 | Discharge: 2022-10-13 | Disposition: A | Discharge status: Home / Self Care | Payer: Medicare Other | Attending: Radiation Oncology | Admitting: Radiation Oncology

## 2022-10-13 DIAGNOSIS — Z87891 Personal history of nicotine dependence: Secondary | ICD-10-CM | POA: Diagnosis not present

## 2022-10-13 DIAGNOSIS — Z6823 Body mass index (BMI) 23.0-23.9, adult: Secondary | ICD-10-CM | POA: Diagnosis not present

## 2022-10-13 DIAGNOSIS — G834 Cauda equina syndrome: Secondary | ICD-10-CM | POA: Diagnosis present

## 2022-10-13 DIAGNOSIS — Y92239 Unspecified place in hospital as the place of occurrence of the external cause: Secondary | ICD-10-CM | POA: Diagnosis not present

## 2022-10-13 DIAGNOSIS — D7589 Other specified diseases of blood and blood-forming organs: Secondary | ICD-10-CM | POA: Diagnosis not present

## 2022-10-13 DIAGNOSIS — R29898 Other symptoms and signs involving the musculoskeletal system: Secondary | ICD-10-CM | POA: Diagnosis not present

## 2022-10-13 DIAGNOSIS — R079 Chest pain, unspecified: Secondary | ICD-10-CM | POA: Diagnosis not present

## 2022-10-13 DIAGNOSIS — C7951 Secondary malignant neoplasm of bone: Secondary | ICD-10-CM | POA: Diagnosis present

## 2022-10-13 DIAGNOSIS — T380X5A Adverse effect of glucocorticoids and synthetic analogues, initial encounter: Secondary | ICD-10-CM | POA: Diagnosis not present

## 2022-10-13 DIAGNOSIS — R918 Other nonspecific abnormal finding of lung field: Secondary | ICD-10-CM | POA: Diagnosis not present

## 2022-10-13 DIAGNOSIS — R0789 Other chest pain: Secondary | ICD-10-CM | POA: Diagnosis present

## 2022-10-13 DIAGNOSIS — R64 Cachexia: Secondary | ICD-10-CM | POA: Diagnosis present

## 2022-10-13 DIAGNOSIS — Z9049 Acquired absence of other specified parts of digestive tract: Secondary | ICD-10-CM | POA: Diagnosis not present

## 2022-10-13 DIAGNOSIS — G9520 Unspecified cord compression: Secondary | ICD-10-CM | POA: Diagnosis not present

## 2022-10-13 DIAGNOSIS — R160 Hepatomegaly, not elsewhere classified: Secondary | ICD-10-CM | POA: Diagnosis not present

## 2022-10-13 DIAGNOSIS — N179 Acute kidney failure, unspecified: Secondary | ICD-10-CM | POA: Diagnosis not present

## 2022-10-13 DIAGNOSIS — R7401 Elevation of levels of liver transaminase levels: Secondary | ICD-10-CM | POA: Diagnosis present

## 2022-10-13 DIAGNOSIS — R338 Other retention of urine: Secondary | ICD-10-CM | POA: Diagnosis not present

## 2022-10-13 DIAGNOSIS — C221 Intrahepatic bile duct carcinoma: Secondary | ICD-10-CM | POA: Diagnosis present

## 2022-10-13 DIAGNOSIS — D72829 Elevated white blood cell count, unspecified: Secondary | ICD-10-CM | POA: Diagnosis not present

## 2022-10-13 DIAGNOSIS — Z86711 Personal history of pulmonary embolism: Secondary | ICD-10-CM | POA: Diagnosis not present

## 2022-10-13 DIAGNOSIS — E440 Moderate protein-calorie malnutrition: Secondary | ICD-10-CM | POA: Diagnosis present

## 2022-10-13 DIAGNOSIS — R339 Retention of urine, unspecified: Secondary | ICD-10-CM | POA: Diagnosis present

## 2022-10-13 DIAGNOSIS — K5900 Constipation, unspecified: Secondary | ICD-10-CM | POA: Diagnosis present

## 2022-10-13 DIAGNOSIS — G992 Myelopathy in diseases classified elsewhere: Secondary | ICD-10-CM | POA: Diagnosis present

## 2022-10-13 DIAGNOSIS — E8809 Other disorders of plasma-protein metabolism, not elsewhere classified: Secondary | ICD-10-CM | POA: Diagnosis present

## 2022-10-13 DIAGNOSIS — R7309 Other abnormal glucose: Secondary | ICD-10-CM | POA: Diagnosis not present

## 2022-10-13 DIAGNOSIS — Z923 Personal history of irradiation: Secondary | ICD-10-CM | POA: Diagnosis not present

## 2022-10-13 DIAGNOSIS — C249 Malignant neoplasm of biliary tract, unspecified: Secondary | ICD-10-CM | POA: Diagnosis not present

## 2022-10-13 DIAGNOSIS — Z7901 Long term (current) use of anticoagulants: Secondary | ICD-10-CM | POA: Diagnosis not present

## 2022-10-13 DIAGNOSIS — D61818 Other pancytopenia: Secondary | ICD-10-CM | POA: Diagnosis present

## 2022-10-13 DIAGNOSIS — R7989 Other specified abnormal findings of blood chemistry: Secondary | ICD-10-CM | POA: Diagnosis present

## 2022-10-13 DIAGNOSIS — G9529 Other cord compression: Secondary | ICD-10-CM | POA: Diagnosis present

## 2022-10-13 DIAGNOSIS — G8220 Paraplegia, unspecified: Secondary | ICD-10-CM | POA: Diagnosis present

## 2022-10-13 LAB — HEPARIN LEVEL (UNFRACTIONATED)
Heparin Unfractionated: 0.46 IU/mL (ref 0.30–0.70)
Heparin Unfractionated: 0.2 IU/mL — ABNORMAL LOW (ref 0.30–0.70)

## 2022-10-13 LAB — HIV ANTIBODY (ROUTINE TESTING W REFLEX): HIV Screen 4th Generation wRfx: NONREACTIVE

## 2022-10-13 LAB — APTT: aPTT: 66 seconds — ABNORMAL HIGH (ref 24–36)

## 2022-10-13 MED ORDER — SENNA 8.6 MG PO TABS
1.0000 | ORAL_TABLET | Freq: Two times a day (BID) | ORAL | Status: DC
  Administered 2022-10-14 – 2022-10-27 (×26): 8.6 mg via ORAL
  Filled 2022-10-13 (×27): qty 1

## 2022-10-13 MED ORDER — OXYCODONE HCL 5 MG PO TABS
5.0000 mg | ORAL_TABLET | ORAL | Status: DC | PRN
  Administered 2022-10-13 – 2022-10-29 (×33): 5 mg via ORAL
  Filled 2022-10-13 (×33): qty 1

## 2022-10-13 MED ORDER — ACETAMINOPHEN 650 MG RE SUPP: 650.0000 mg | Freq: Four times a day (QID) | RECTAL | Status: DC | PRN

## 2022-10-13 MED ORDER — MORPHINE SULFATE (PF) 2 MG/ML IV SOLN
2.0000 mg | Freq: Once | INTRAVENOUS | Status: AC
  Administered 2022-10-13: 2 mg via INTRAVENOUS
  Filled 2022-10-13: qty 1

## 2022-10-13 MED ORDER — ONDANSETRON HCL 4 MG PO TABS
4.0000 mg | ORAL_TABLET | Freq: Four times a day (QID) | ORAL | Status: DC | PRN
  Administered 2022-10-25: 4 mg via ORAL
  Filled 2022-10-13: qty 1

## 2022-10-13 MED ORDER — MORPHINE SULFATE (PF) 2 MG/ML IV SOLN: 2.0000 mg | INTRAVENOUS | Status: DC | PRN

## 2022-10-13 MED ORDER — ONDANSETRON HCL 4 MG/2ML IJ SOLN
4.0000 mg | Freq: Four times a day (QID) | INTRAMUSCULAR | Status: DC | PRN
  Administered 2022-10-15: 4 mg via INTRAVENOUS
  Filled 2022-10-13: qty 2

## 2022-10-13 MED ORDER — HYDROMORPHONE HCL 1 MG/ML IJ SOLN
1.0000 mg | INTRAMUSCULAR | Status: DC | PRN
  Administered 2022-10-13 – 2022-10-29 (×59): 1 mg via INTRAVENOUS
  Filled 2022-10-13 (×59): qty 1

## 2022-10-13 MED ORDER — DEXAMETHASONE 4 MG PO TABS
4.0000 mg | ORAL_TABLET | Freq: Three times a day (TID) | ORAL | Status: DC
  Administered 2022-10-13 (×2): 4 mg via ORAL
  Filled 2022-10-13 (×3): qty 1

## 2022-10-13 MED ORDER — HEPARIN (PORCINE) 25000 UT/250ML-% IV SOLN
850.0000 [IU]/h | INTRAVENOUS | Status: AC
  Administered 2022-10-13: 850 [IU]/h via INTRAVENOUS
  Filled 2022-10-13 (×2): qty 250

## 2022-10-13 MED ORDER — DEXAMETHASONE SODIUM PHOSPHATE 10 MG/ML IJ SOLN
10.0000 mg | Freq: Once | INTRAMUSCULAR | Status: AC
  Administered 2022-10-13: 10 mg via INTRAVENOUS
  Filled 2022-10-13: qty 1

## 2022-10-13 MED ORDER — FENTANYL 12 MCG/HR TD PT72: 1.0000 | MEDICATED_PATCH | TRANSDERMAL | Status: DC

## 2022-10-13 MED ORDER — FENTANYL 12 MCG/HR TD PT72
1.0000 | MEDICATED_PATCH | Freq: Once | TRANSDERMAL | Status: DC
  Filled 2022-10-13: qty 1

## 2022-10-13 MED ORDER — ACETAMINOPHEN 325 MG PO TABS
650.0000 mg | ORAL_TABLET | Freq: Four times a day (QID) | ORAL | Status: DC | PRN
  Administered 2022-10-27 – 2022-10-28 (×2): 650 mg via ORAL
  Filled 2022-10-13 (×2): qty 2

## 2022-10-13 MED ORDER — DEXAMETHASONE 4 MG PO TABS
4.0000 mg | ORAL_TABLET | Freq: Four times a day (QID) | ORAL | Status: DC
  Administered 2022-10-14 – 2022-10-29 (×60): 4 mg via ORAL
  Filled 2022-10-13 (×61): qty 1

## 2022-10-13 MED ORDER — DEXAMETHASONE SODIUM PHOSPHATE 4 MG/ML IJ SOLN: 4.0000 mg | Freq: Two times a day (BID) | INTRAMUSCULAR | Status: DC

## 2022-10-13 NOTE — Consult Note (Signed)
   Providing Compassionate, Quality Care - Together  Neurosurgery Consult  Referring physician: ED MD Reason for referral: T Spine metastasis and weakness  Chief Complaint: chest pain and BLE weakness  History of Present Illness: This is a 67 year old male, minimi speaking, with a history of PE, on apixaban, recent metastatic cholangiocarcinoma to the spine and liver, status post SBRT to T4 lesion (August 2022), with complaints of progressive worsening chest pain and bilateral lower extremity weakness.  He states his weakness in the lower extremities has been significant over the past 3 to 4 weeks causing him unable to move his legs or walk.  He denies any abdominal pain, nausea or vomiting.  He denies any vision changes or headaches.  He has been undergoing systemic therapy at [facility].   Medications: I have reviewed the patient's current medications. Allergies: No Known Allergies  History reviewed. No pertinent family history. Social History:  has no history on file for tobacco use, alcohol use, and drug use.  ROS: All pertinent positives and negatives are listed above  Physical Exam:  Vital signs in last 24 hours: Temp:  [98 F (36.7 C)-98.3 F (36.8 C)] 98 F (36.7 C) (07/25 1814) Pulse Rate:  [58-128] 65 (07/26 0746) Resp:  [11-18] 14 (07/26 0217) BP: (138-182)/(65-125) 153/88 (07/26 0700) SpO2:  [91 %-98 %] 96 % (07/26 0746) PE:  Awake alert, viewed to me speaking PERRLA Speech fluent and appropriate Upper extremities symmetric full strength Bilateral lower extremities 0/5, flaccid Face symmetric Nonlabored breathing  Imaging: MRI T-spine, C-spine, lumbar spine reviewed.  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  I do not believe he is a surgical candidate given his  continued failure of systemic treatment and the length of time he has had lower extremity weakness. -He would benefit from palliative radiation therefore recommend evaluation from oncology and radiation teams. -I do not recommend any acute neurosurgical intervention -Continue Decadron 4 mg every 6 hours -I explained this via translator on the phone with the patient, I attempted to reach his son via phone without success.  Thank you for allowing me to participate in this patient's care.  Please do not hesitate to call with questions or concerns.   Elwin Sleight, [facility] Cell: [phone]

## 2022-10-13 NOTE — Progress Notes (Signed)
ANTICOAGULATION CONSULT NOTE - Initial Consult  Pharmacy Consult for Heparin (Apixaban on hold) Indication: History of PE  No Known Allergies  Vital Signs: Temp: 98 F (36.7 C) (01/03 0503) Temp Source: Oral (01/03 0503) BP: 122/75 (01/03 0503) Pulse Rate: 65 (01/03 0503)  Labs: Recent Labs    10/12/22 1103 10/12/22 1254  HGB 14.3  --   HCT 43.3  --   PLT 132*  --   CREATININE 0.75  --   TROPONINIHS 4 5    CrCl cannot be calculated (Unknown ideal weight.).   Medical History: No past medical history on file.  Assessment: 67 y/o M on apixaban PTA for history of PE, needing neurosurgery evaluation, holding apixaban and starting heparin, last dose of apixaban was 1/2 AM, ok to start heparin now. Anticipate using aPTT to dose for now.   Goal of Therapy:  Heparin level 0.3-0.7 units/ml aPTT 66-102 seconds Monitor platelets by anticoagulation protocol: Yes   Plan:  Start heparin drip at 850 units/hr 1400 heparin level and aPTT Daily CBC, heparin level, and aPTT Monitor for bleeding  Narda Bonds, PharmD, BCPS Clinical Pharmacist Phone: [phone]

## 2022-10-13 NOTE — H&P (Addendum)
PCP:   Pcp, No   Chief Complaint:  Paraplegia  HPI: This is an unfortunate 67 year old [place] speaking only gentleman with history of cholangiocarcinoma, metastatic to bone .  He has been on chemotherapy every 3 weeks for at least the past 2 years.  He has had prior mets to T4 spine which was treated with radiation therapy.  The patient's chemotherapy is not at [facility].  He presents with complaint of inability to walk.  He states he has had progressive lower extremity weakness for the past 4 weeks which has not resulted in his inability to ambulate.  Per patient he states his sensations intact.  He states he has control his urination but does not believe he is poop for most 3 weeks.  He denies any abdominal pain, nausea or vomiting.  He additionally endorses left-sided chest pain.  He states this is also been present for the past 4 weeks.  He was recently inpatient at [facility] for chest pains.  He was seen by cardiology and told it was cancer related.    In the ER CT shows a new T6 spinal mets as well as scattered new pulmonary nodules.  History provided by patient.  Language line used  Review of Systems:  The patient denies anorexia, fever, weight loss,, vision loss, decreased hearing, hoarseness, syncope, dyspnea on exertion, peripheral edema, balance deficits, hemoptysis, abdominal pain, melena, hematochezia, severe indigestion/heartburn, hematuria, incontinence, genital sores, muscle weakness, suspicious skin lesions, transient blindness, depression, unusual weight change, abnormal bleeding, enlarged lymph nodes, angioedema, and breast masses. Positives: Lower extremity weakness, inability to ambulate, chest pain  Past Medical History: Pulmonary embolism, cholangiography carcinoma Past Surgical History:  Procedure Laterality Date   IR IMAGING GUIDED PORT INSERTION  05/19/2021    Medications: Prior to Admission medications   Medication Sig Start Date End Date Taking?  Authorizing Provider  apixaban (ELIQUIS) 5 MG TABS tablet Take 1 tablet by mouth 2 times daily. 09/18/21   Heath Lark, MD  cholecalciferol (VITAMIN D3) 25 MCG (1000 UNIT) tablet Take 1,000 Units by mouth daily.    [provider]  oxyCODONE (OXY IR/ROXICODONE) 5 MG immediate release tablet Take 1 tablet (5 mg total) by mouth every 4 hours as needed for severe pain. 07/17/21   Heath Lark, MD  prochlorperazine (COMPAZINE) 10 MG tablet Take 1 tablet by mouth every 6 hours as needed (Nausea or vomiting). 05/21/21 11/01/21  Heath Lark, MD    Allergies:  No Known Allergies  Social History:  reports that he quit smoking about 17 months ago. His smoking use included cigarettes. He has a 20.00 pack-year smoking history. He has been exposed to tobacco smoke. He has never used smokeless tobacco. He reports current alcohol use of about 1.0 - 2.0 standard drink of alcohol per week. He reports that he does not use drugs.  Family History: Family History  Problem Relation Age of Onset   Cancer Father        throat cancer    Physical Exam: Vitals:   10/12/22 1840 10/12/22 2143 10/13/22 0015 10/13/22 0159  BP: 116/67 118/72 122/70 118/65  Pulse: 60 62 65 62  Resp: '18 18 14 16  '$ Temp:  98.1 F (36.7 C) 98.1 F (36.7 C) 98 F (36.7 C)  TempSrc:  Oral Oral Oral  SpO2: 100% 98% 98% 97%    General:  Alert and oriented times three, well developed and nourished, in pain left upper extremity Eyes: PERRLA, pink conjunctiva, no scleral icterus ENT:  Moist oral mucosa, neck supple, no thyromegaly Lungs: clear to ascultation, no wheeze, no crackles, no use of accessory muscles Cardiovascular: regular rate and rhythm, no regurgitation, no gallops, no murmurs. No carotid bruits, no JVD Abdomen: Firm lower abdomen, with TTP greater in the lower abdomen.  Positive bowel sounds, not an acute abdomen GU: not examined Neuro: CN II - XII grossly intact, sensation intact Musculoskeletal: strength 5/5 all  extremities, no clubbing, cyanosis or edema Skin: no rash, no subcutaneous crepitation, no decubitus Psych: appropriate patient   Labs on Admission:  Recent Labs    10/12/22 1103  NA 139  K 4.1  CL 102  CO2 27  GLUCOSE 118*  BUN 13  CREATININE 0.75  CALCIUM 9.3    Recent Labs    10/12/22 1103  WBC 5.6  NEUTROABS 3.7  HGB 14.3  HCT 43.3  MCV 100.7*  PLT 132*    Radiological Exams on Admission: MR THORACIC SPINE W WO CONTRAST  Result Date: 10/13/2022 CLINICAL DATA:  Osseous metastatic disease EXAM: MRI CERVICAL, THORACIC AND LUMBAR SPINE WITHOUT AND WITH CONTRAST TECHNIQUE: Multiplanar and multiecho pulse sequences of the cervical spine, to include the craniocervical junction and cervicothoracic junction, and thoracic and lumbar spine, were obtained without and with intravenous contrast. CONTRAST:  18m GADAVIST GADOBUTROL 1 MMOL/ML IV SOLN COMPARISON:  None available FINDINGS: MRI CERVICAL SPINE FINDINGS Alignment: Normal Vertebrae: No fracture, evidence of discitis, or bone lesion. Cord: Normal signal and morphology. Posterior Fossa, vertebral arteries, paraspinal tissues: Negative Disc levels: C1-2: Unremarkable. C2-3: Small central disc protrusion. There is no spinal canal stenosis. No neural foraminal stenosis. C3-4: Normal disc space and facet joints. There is no spinal canal stenosis. No neural foraminal stenosis. C4-5: Normal disc space and facet joints. There is no spinal canal stenosis. No neural foraminal stenosis. C5-6: Small disc bulge. There is no spinal canal stenosis. Mild left neural foraminal stenosis. C6-7: Normal disc space and facet joints. There is no spinal canal stenosis. No neural foraminal stenosis. C7-T1: Normal disc space and facet joints. There is no spinal canal stenosis. No neural foraminal stenosis. MRI THORACIC SPINE FINDINGS Alignment:  Physiologic. Vertebrae: There are multiple expansile, contrast-enhancing masses. There are circumferential lesions at  T2 and T3 that encroach on the spinal canal, particularly at right T3. There is a similar lesion of T6 that also encroaches on the spinal canal and causes severe stenosis of the thecal sac. Mild T4 compression deformity. Edema at T4 has resolved. Cord:  Normal signal and morphology. Paraspinal and other soft tissues: Pulmonary nodules are better assessed on the earlier CT. Disc levels: The thecal sac is effaced at the T2, T3 and T6 levels. There is moderate mass effect on the spinal cord at the T6 level. MRI LUMBAR SPINE FINDINGS Segmentation:  Standard. Alignment:  Normal Vertebrae: No fracture, evidence of discitis, or bone lesion. L1 hemangioma. Conus medullaris and cauda equina: Conus extends to the L1 level. Conus and cauda equina appear normal. Paraspinal and other soft tissues: Negative Disc levels: L1-L2: Normal disc space and facet joints. No spinal canal stenosis. No neural foraminal stenosis. L2-L3: Normal disc space and facet joints. No spinal canal stenosis. No neural foraminal stenosis. L3-L4: Normal disc space and facet joints. No spinal canal stenosis. No neural foraminal stenosis. L4-L5: Left asymmetric disc bulge with narrowing of left lateral recess and posterior displacement of the left L5 nerve root. No central spinal canal stenosis. No neural foraminal stenosis. L5-S1: Left asymmetric disc bulge with mild left  lateral recess narrowing and posterior displacement of the left S1 nerve root. No spinal canal stenosis. No neural foraminal stenosis. Visualized sacrum: Normal. IMPRESSION: 1. Multiple expansile, contrast-enhancing masses of the upper thoracic spine with epidural extension causing encroachment on the thecal sac. Moderate mass effect on the spinal cord at the T6 level 2. No metastatic disease of the cervical or lumbar spine. 3. Left lateral recess narrowing at L4-5 and L5-S1 with posterior displacement of the left L5 and S1 nerve roots. 4. Resolution of edema at T4 without progression of  height loss. Electronically Signed   By: Ulyses Jarred M.D.   On: 10/13/2022 00:20   MR Lumbar Spine W Wo Contrast  Result Date: 10/13/2022 CLINICAL DATA:  Osseous metastatic disease EXAM: MRI CERVICAL, THORACIC AND LUMBAR SPINE WITHOUT AND WITH CONTRAST TECHNIQUE: Multiplanar and multiecho pulse sequences of the cervical spine, to include the craniocervical junction and cervicothoracic junction, and thoracic and lumbar spine, were obtained without and with intravenous contrast. CONTRAST:  71m GADAVIST GADOBUTROL 1 MMOL/ML IV SOLN COMPARISON:  None available FINDINGS: MRI CERVICAL SPINE FINDINGS Alignment: Normal Vertebrae: No fracture, evidence of discitis, or bone lesion. Cord: Normal signal and morphology. Posterior Fossa, vertebral arteries, paraspinal tissues: Negative Disc levels: C1-2: Unremarkable. C2-3: Small central disc protrusion. There is no spinal canal stenosis. No neural foraminal stenosis. C3-4: Normal disc space and facet joints. There is no spinal canal stenosis. No neural foraminal stenosis. C4-5: Normal disc space and facet joints. There is no spinal canal stenosis. No neural foraminal stenosis. C5-6: Small disc bulge. There is no spinal canal stenosis. Mild left neural foraminal stenosis. C6-7: Normal disc space and facet joints. There is no spinal canal stenosis. No neural foraminal stenosis. C7-T1: Normal disc space and facet joints. There is no spinal canal stenosis. No neural foraminal stenosis. MRI THORACIC SPINE FINDINGS Alignment:  Physiologic. Vertebrae: There are multiple expansile, contrast-enhancing masses. There are circumferential lesions at T2 and T3 that encroach on the spinal canal, particularly at right T3. There is a similar lesion of T6 that also encroaches on the spinal canal and causes severe stenosis of the thecal sac. Mild T4 compression deformity. Edema at T4 has resolved. Cord:  Normal signal and morphology. Paraspinal and other soft tissues: Pulmonary nodules are  better assessed on the earlier CT. Disc levels: The thecal sac is effaced at the T2, T3 and T6 levels. There is moderate mass effect on the spinal cord at the T6 level. MRI LUMBAR SPINE FINDINGS Segmentation:  Standard. Alignment:  Normal Vertebrae: No fracture, evidence of discitis, or bone lesion. L1 hemangioma. Conus medullaris and cauda equina: Conus extends to the L1 level. Conus and cauda equina appear normal. Paraspinal and other soft tissues: Negative Disc levels: L1-L2: Normal disc space and facet joints. No spinal canal stenosis. No neural foraminal stenosis. L2-L3: Normal disc space and facet joints. No spinal canal stenosis. No neural foraminal stenosis. L3-L4: Normal disc space and facet joints. No spinal canal stenosis. No neural foraminal stenosis. L4-L5: Left asymmetric disc bulge with narrowing of left lateral recess and posterior displacement of the left L5 nerve root. No central spinal canal stenosis. No neural foraminal stenosis. L5-S1: Left asymmetric disc bulge with mild left lateral recess narrowing and posterior displacement of the left S1 nerve root. No spinal canal stenosis. No neural foraminal stenosis. Visualized sacrum: Normal. IMPRESSION: 1. Multiple expansile, contrast-enhancing masses of the upper thoracic spine with epidural extension causing encroachment on the thecal sac. Moderate mass effect on the spinal  cord at the T6 level 2. No metastatic disease of the cervical or lumbar spine. 3. Left lateral recess narrowing at L4-5 and L5-S1 with posterior displacement of the left L5 and S1 nerve roots. 4. Resolution of edema at T4 without progression of height loss. Electronically Signed   By: Ulyses Jarred M.D.   On: 10/13/2022 00:20   MR CERVICAL SPINE W WO CONTRAST  Result Date: 10/13/2022 CLINICAL DATA:  Osseous metastatic disease EXAM: MRI CERVICAL, THORACIC AND LUMBAR SPINE WITHOUT AND WITH CONTRAST TECHNIQUE: Multiplanar and multiecho pulse sequences of the cervical spine, to  include the craniocervical junction and cervicothoracic junction, and thoracic and lumbar spine, were obtained without and with intravenous contrast. CONTRAST:  58m GADAVIST GADOBUTROL 1 MMOL/ML IV SOLN COMPARISON:  None available FINDINGS: MRI CERVICAL SPINE FINDINGS Alignment: Normal Vertebrae: No fracture, evidence of discitis, or bone lesion. Cord: Normal signal and morphology. Posterior Fossa, vertebral arteries, paraspinal tissues: Negative Disc levels: C1-2: Unremarkable. C2-3: Small central disc protrusion. There is no spinal canal stenosis. No neural foraminal stenosis. C3-4: Normal disc space and facet joints. There is no spinal canal stenosis. No neural foraminal stenosis. C4-5: Normal disc space and facet joints. There is no spinal canal stenosis. No neural foraminal stenosis. C5-6: Small disc bulge. There is no spinal canal stenosis. Mild left neural foraminal stenosis. C6-7: Normal disc space and facet joints. There is no spinal canal stenosis. No neural foraminal stenosis. C7-T1: Normal disc space and facet joints. There is no spinal canal stenosis. No neural foraminal stenosis. MRI THORACIC SPINE FINDINGS Alignment:  Physiologic. Vertebrae: There are multiple expansile, contrast-enhancing masses. There are circumferential lesions at T2 and T3 that encroach on the spinal canal, particularly at right T3. There is a similar lesion of T6 that also encroaches on the spinal canal and causes severe stenosis of the thecal sac. Mild T4 compression deformity. Edema at T4 has resolved. Cord:  Normal signal and morphology. Paraspinal and other soft tissues: Pulmonary nodules are better assessed on the earlier CT. Disc levels: The thecal sac is effaced at the T2, T3 and T6 levels. There is moderate mass effect on the spinal cord at the T6 level. MRI LUMBAR SPINE FINDINGS Segmentation:  Standard. Alignment:  Normal Vertebrae: No fracture, evidence of discitis, or bone lesion. L1 hemangioma. Conus medullaris and  cauda equina: Conus extends to the L1 level. Conus and cauda equina appear normal. Paraspinal and other soft tissues: Negative Disc levels: L1-L2: Normal disc space and facet joints. No spinal canal stenosis. No neural foraminal stenosis. L2-L3: Normal disc space and facet joints. No spinal canal stenosis. No neural foraminal stenosis. L3-L4: Normal disc space and facet joints. No spinal canal stenosis. No neural foraminal stenosis. L4-L5: Left asymmetric disc bulge with narrowing of left lateral recess and posterior displacement of the left L5 nerve root. No central spinal canal stenosis. No neural foraminal stenosis. L5-S1: Left asymmetric disc bulge with mild left lateral recess narrowing and posterior displacement of the left S1 nerve root. No spinal canal stenosis. No neural foraminal stenosis. Visualized sacrum: Normal. IMPRESSION: 1. Multiple expansile, contrast-enhancing masses of the upper thoracic spine with epidural extension causing encroachment on the thecal sac. Moderate mass effect on the spinal cord at the T6 level 2. No metastatic disease of the cervical or lumbar spine. 3. Left lateral recess narrowing at L4-5 and L5-S1 with posterior displacement of the left L5 and S1 nerve roots. 4. Resolution of edema at T4 without progression of height loss. Electronically Signed  By: Ulyses Jarred M.D.   On: 10/13/2022 00:20   CT Angio Chest PE W/Cm &/Or Wo Cm  Result Date: 10/12/2022 CLINICAL DATA:  History of cholangiocarcinoma with right-sided chest pain radiating into the back EXAM: CT ANGIOGRAPHY CHEST WITH CONTRAST TECHNIQUE: Multidetector CT imaging of the chest was performed using the standard protocol during bolus administration of intravenous contrast. Multiplanar CT image reconstructions and MIPs were obtained to evaluate the vascular anatomy. RADIATION DOSE REDUCTION: This exam was performed according to the departmental dose-optimization program which includes automated exposure control,  adjustment of the mA and/or kV according to patient size and/or use of iterative reconstruction technique. CONTRAST:  76m OMNIPAQUE IOHEXOL 350 MG/ML SOLN COMPARISON:  CT chest dated 10/26/2021 FINDINGS: Cardiovascular: The study is high quality for the evaluation of pulmonary embolism. There are no filling defects in the central, lobar, segmental or subsegmental pulmonary artery branches to suggest acute pulmonary embolism. Ascending aorta measures 4.1 cm, unchanged. Right chest wall port terminates at the superior cavoatrial junction. Normal heart size. No significant pericardial fluid/thickening. Coronary artery calcifications and aortic atherosclerosis. Mediastinum/Nodes: Imaged thyroid gland without nodules meeting criteria for imaging follow-up by size. Normal esophagus. Unchanged 10 mm right hilar lymph node (5:62). Lungs/Pleura: The central airways are patent. Scattered pulmonary nodules are new or slightly increased in size, for example left upper lobe 6 x 5 mm nodule (6:29), previously 2 mm, subpleural left upper lobe nodule measuring 5 x 4 mm (6:67), previously 2 mm, and subpleural 4 x 4 mm right lower lobe nodule (6:60). New 2 mm subpleural left upper lobe ground-glass nodule (6:36) and 3 mm left lower lobe nodule (6:89). Unchanged left lower lobe 3 mm perifissural nodule (6:74). No focal consolidation. No pneumothorax. No pleural effusion. Upper abdomen: Increased atrophic appearance of the right hepatic lobe. Additional multifocal hypoattenuating foci measuring up to 3.4 cm in segment 2/3 (5:128) are new. Musculoskeletal: Lytic and sclerotic appearance of T2 and T6 and right transverse process of T3 are new. Similar compression deformity of T4 with increased lytic appearance. Review of the MIP images confirms the above findings. IMPRESSION: 1. No acute pulmonary embolism. 2. New and increased size of scattered pulmonary nodules, suspicious for metastatic disease. 3. New multifocal hypoattenuating foci  in the liver, suspicious for metastatic disease. 4. New lytic and sclerotic appearance of T2 and T6 and right transverse process of T3, suspicious for osseous metastatic disease. Similar compression deformity of T4 with increased lytic appearance. 5. Coronary artery calcifications. Aortic Atherosclerosis (ICD10-I70.0). Electronically Signed   By: LDarrin NipperM.D.   On: 10/12/2022 14:21   DG Chest 2 View  Result Date: 10/12/2022 CLINICAL DATA:  Chest pain. EXAM: CHEST - 2 VIEW COMPARISON:  May 13, 2021. FINDINGS: The heart size and mediastinal contours are within normal limits. Both lungs are clear. Right internal jugular Port-A-Cath is noted with distal tip in expected position of the SVC. The visualized skeletal structures are unremarkable. IMPRESSION: No active cardiopulmonary disease. Electronically Signed   By: JMarijo ConceptionM.D.   On: 10/12/2022 11:48    Assessment/Plan Present on Admission:  Paraplegia  Metastatic cancer T6 spine ([place]Neurosurgery consult [place].  Will see patient in ER. -Per EDP, neurosurgery recommended oncology and radiation oncology consult in AM to determine if radiation to T6 and debulking is a possibility versus hospice.  There appears to be concerned that given his T4 radiation, T6 radiation may lead to repeat radiation of T4 that could cause further deterioration  neurological status/debility. -Decadron 10 mg IV x 1 given in ER.  Continue 4 mg IV every 12 -Pain medications as needed  Chest pain -Most likely cancer related.  However will monitor on telemetry.  EKG done normal sinus rhythm no ischemic changes.  Will order a single troponin. -Dilaudid as needed -Stoic gentleman, unlikely to ask for pain medication until severe.  Pain patch not started as patient not having consistent bowel function due to T6 mets. -IV fluid hydration   Cholangiocarcinoma  -On chemotherapy.  Liver mets with mets to bone and lung.   Pulmonary embolism  ([place]) -Maintained on Eliquis.  Will transition to heparin while decision for intervention underway   Madelon Welsch 10/13/2022, 3:05 AM

## 2022-10-13 NOTE — Progress Notes (Signed)
This is a very pleasant but unfortunate 67 year old with numbness gentleman with history of cholangiocarcinoma metastasis to the bone presented to the ED with lower extremity weakness lasting for about 2 weeks.  He sees oncologist at [facility] and has been receiving chemotherapy for last couple of years.  Has a history of T4 metastatic lesion in the past received radiation for that.  Patient was discussed with neurosurgery who recommended possible radiation to the T6.  Patient was admitted to hospital service.  I saw patient in the ED. Marland Kitchen  He can feel [employer] it in [place].  He complains of back pain.  Still unable to move his lower extremities or wiggle his toes.  I had consulted oncology who had consulted radiation oncology and they have decided to start radiation on this gentleman.  Radiation oncology has requested transfer to [facility] long which has been initiated.  He continues to be on Decadron.  Will defer further management to oncology and neurosurgery.  I spent total about 23 minutes.

## 2022-10-13 NOTE — Progress Notes (Signed)
[facility] for Heparin (Apixaban on hold) Indication: History of PE  No Known Allergies Last weight 10/07/22  - 59 kg Vital Signs: Temp: 98.4 F (36.9 C) (01/03 1753) Temp Source: Oral (01/03 2024) BP: 148/88 (01/03 2024) Pulse Rate: 88 (01/03 2024)  Labs: Recent Labs    10/12/22 1103 10/12/22 1254 10/13/22 1430 10/13/22 1450 10/13/22 2123  HGB 14.3  --   --   --   --   HCT 43.3  --   --   --   --   PLT 132*  --   --   --   --   APTT  --   --  66*  --   --   HEPARINUNFRC  --   --   --  0.46 0.20*  CREATININE 0.75  --   --   --   --   TROPONINIHS 4 5  --   --   --     CrCl cannot be calculated (Unknown ideal weight.).  Assessment: 67 y/o M on apixaban PTA for history of PE, needing neurosurgery evaluation, holding apixaban and starting heparin, last dose of apixaban was 12/31, ok to start heparin now.  PM update: - Heparin level dropped to 0.20 --No issues with infusion.  - No plan for acute neurosurgical intervention per progress note of Dr. Reatha Armour today  Goal of Therapy:  Heparin level 0.3-0.7 units/ml Monitor platelets by anticoagulation protocol: Yes   Plan:  Increase heparin drip to 950 units/hr (no bolus) Repeat heparin level in 6 hrs Daily CBC, heparin level Monitor for bleeding F/u ability to transition back to [place], PharmD, BCPS, BCCCP Please refer to [facility] for [place] numbers 10/13/2022 10:17 PM

## 2022-10-13 NOTE — Consult Note (Signed)
Radiation Oncology         (336) [phone] ________________________________  Name: Jason Lyons        MRN: [id number]  Date of Service: 10/13/22  DOB: October 16, 1955  CC:Pcp, No     REFERRING PHYSICIAN: Dr. Lorenso Courier   DIAGNOSIS: The primary encounter diagnosis was Nonspecific chest pain. 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   HISTORY OF PRESENT ILLNESS: Jason Lyons is a 67 y.o. male seen at the request of Dr. Lorenso Courier in medical oncology for a diagnosis of metastatic cholangiocarcioma. 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  Given his isolated findings in the spine, he was offered stereotactic radiosurgery which she completed on 06/09/2021.  He was followed briefly in surveillance but ultimately was lost to follow-up after no showing his MRI appointments in January and April 2023.  It appears that he relocated his care to [facility] under the care of Dr. Nelda Marseille.  It appears that the patient has been receiving systemic FOLFOX including dose reductions as of late with his most recent infusions being in October 2023.  It also appeared that he had to embolization procedures to the liver, he had no showed several appointments and represented in mid December to the medical oncology clinic there and plans were to resume systemic FOLFOX with dose reduction which she was due for yesterday.  He developed acute onset of chest pain and weakness in his lower extremities and presented on 10/12/2022 to the emergency department at [facility].  He had a CT angiogram that showed no evidence of pulmonary embolism but an increase in his known pulmonary disease and focal lesion in  the liver that was new.  In addition there was lytic and sclerotic changes of T2 and T6 along the right T3 transverse process as well and similar compression deformity with increased lytic appearance of T4.  He underwent an MRI of the complete spine which showed circumferential lesions at T2 and T3 encroaching the spinal canal particularly at the right aspect of the T3 lesion a similar finding was also seen at T6 encroaching on the spinal canal and causing severe stenosis of the thecal sac.  Mild T4 compression deformity and edema at T4 had resolved in the interval and the cord retains normal signal and morphology.  Left lateral recess narrowing at L4-5 and 5 through S1 with posterior displacement of the left L5 and S1 nerve roots related to disc bulging.  No other evidence of metastatic disease is identified in those films.  He was seen by Dr. Mallie Mussel and neurosurgery who does not feel he is a good candidate for any decompressive surgery.  We have been asked to see him to consider palliative radiotherapy to the thoracic spine.  The patient was started on dexamethasone 10 mg in the ER, he is due for his next dose of steroid at 10 AM.    PREVIOUS RADIATION THERAPY:   06/09/2021 through 06/09/2021 SRS Treatment Site Technique Total Dose (Gy) Dose per Fx (Gy) Completed Fx Beam Energies  Thoracic Spine: SRS Spine_T4 IMRT 18/18 18 1/1 [id number]     PAST MEDICAL HISTORY: No past medical history on file.     PAST SURGICAL HISTORY: Past Surgical  History:  Procedure Laterality Date   IR IMAGING GUIDED PORT INSERTION  05/19/2021     FAMILY HISTORY:  Family History  Problem Relation Age of Onset   Cancer Father        throat cancer     SOCIAL HISTORY:  reports that he quit smoking about 17 months ago. His smoking use included cigarettes. He has a 20.00 pack-year smoking history. He has been exposed to tobacco smoke. He has never used smokeless tobacco. He reports current alcohol use of about 1.0 -  2.0 standard drink of alcohol per week. He reports that he does not use drugs.  The patient resides in [place].  His son Jason Lyons is his translator as he speaks a a specific dialect where he is from [place].   ALLERGIES: Patient has no known allergies.   MEDICATIONS:  Current Facility-Administered Medications  Medication Dose Route Frequency Provider Last Rate Last Admin   acetaminophen (TYLENOL) tablet 650 mg  650 mg Oral Q6H PRN Crosley, Debby, MD       Or   acetaminophen (TYLENOL) suppository 650 mg  650 mg Rectal Q6H PRN Crosley, Debby, MD       dexamethasone (DECADRON) injection 4 mg  4 mg Intravenous Q12H Crosley, Debby, MD       heparin ADULT infusion 100 units/mL (25000 units/258m)  850 Units/hr Intravenous Continuous LErenest Blank RPH 8.5 mL/hr at 10/13/22 0551 850 Units/hr at 10/13/22 0551   HYDROmorphone (DILAUDID) injection 1 mg  1 mg Intravenous Q4H PRN Crosley, Debby, MD   1 mg at 10/13/22 0421   ondansetron (ZOFRAN) tablet 4 mg  4 mg Oral Q6H PRN Crosley, Debby, MD       Or   ondansetron (ZOFRAN) injection 4 mg  4 mg Intravenous Q6H PRN Crosley, Debby, MD       oxyCODONE (Oxy IR/ROXICODONE) immediate release tablet 5 mg  5 mg Oral [id number] GDelora Fuel MD   5 mg at 10/13/22 0248   senna (SENOKOT) tablet 8.6 mg  1 tablet Oral BID CQuintella Baton MD       Current Outpatient Medications  Medication Sig Dispense Refill   apixaban (ELIQUIS) 5 MG TABS tablet Take 1 tablet by mouth 2 times daily. 60 tablet 6   cholecalciferol (VITAMIN D3) 25 MCG (1000 UNIT) tablet Take 1,000 Units by mouth daily.     oxyCODONE (OXY IR/ROXICODONE) 5 MG immediate release tablet Take 1 tablet (5 mg total) by mouth every 4 hours as needed for severe pain. 60 tablet 0     REVIEW OF SYSTEMS: On review of systems, the patient's son states it has been several days that he has been several weeks since he was able to walk regularly and has gradually lost ability to walk and stant. He has had  progressively worsening chest pain, and is still having some difficulties with his symptoms. No other symptoms are noted by his son Jason Lyons     PHYSICAL EXAM:  Wt Readings from Last 3 Encounters:  10/30/21 150 lb 6.4 oz (68.2 kg)  10/19/21 155 lb 12 oz (70.6 kg)  10/16/21 155 lb 3.2 oz (70.4 kg)   Temp Readings from Last 3 Encounters:  10/13/22 98 F (36.7 C) (Oral)  10/30/21 (!) 97.4 F (36.3 C) (Tympanic)  10/19/21 98.5 F (36.9 C) (Oral)   BP Readings from Last 3 Encounters:  10/13/22 122/75  10/30/21 (!) 146/92  10/19/21 (!) 151/88   Pulse Readings from Last 3 Encounters:  10/13/22  65  10/30/21 76  10/19/21 84   Pain Assessment Pain Score: 4 /10  Unable to assess given encounter type.    ECOG = 4  0 - Asymptomatic (Fully active, able to carry on all predisease activities without restriction)  1 - Symptomatic but completely ambulatory (Restricted in physically strenuous activity but ambulatory and able to carry out work of a light or sedentary nature. For example, light housework, office work)  2 - Symptomatic, <50% in bed during the day (Ambulatory and capable of all self care but unable to carry out any work activities. Up and about more than 50% of waking hours)  3 - Symptomatic, >50% in bed, but not bedbound (Capable of only limited self-care, confined to bed or chair 50% or more of waking hours)  4 - Bedbound (Completely disabled. Cannot carry on any self-care. Totally confined to bed or chair)  5 - Death   Eustace Pen MM, Creech RH, Tormey DC, et al. [phone]). "Toxicity and response criteria of the [facility] Group". [place] Oncol. 5 (6): 649-55    LABORATORY DATA:  Lab Results  Component Value Date   WBC 5.6 10/12/2022   HGB 14.3 10/12/2022   HCT 43.3 10/12/2022   MCV 100.7 (H) 10/12/2022   PLT 132 (L) 10/12/2022   Lab Results  Component Value Date   NA 139 10/12/2022   K 4.1 10/12/2022   CL 102 10/12/2022   CO2 27  10/12/2022   Lab Results  Component Value Date   ALT 48 (H) 10/16/2021   AST 36 10/16/2021   GGT 687 (H) 05/15/2021   ALKPHOS 249 (H) 10/16/2021   BILITOT 0.3 10/16/2021      RADIOGRAPHY: MR THORACIC SPINE W WO CONTRAST  Result Date: 10/13/2022 CLINICAL DATA:  Osseous metastatic disease EXAM: MRI CERVICAL, THORACIC AND LUMBAR SPINE WITHOUT AND WITH CONTRAST TECHNIQUE: Multiplanar and multiecho pulse sequences of the cervical spine, to include the craniocervical junction and cervicothoracic junction, and thoracic and lumbar spine, were obtained without and with intravenous contrast. CONTRAST:  34m GADAVIST GADOBUTROL 1 MMOL/ML IV SOLN COMPARISON:  None available FINDINGS: MRI CERVICAL SPINE FINDINGS Alignment: Normal Vertebrae: No fracture, evidence of discitis, or bone lesion. Cord: Normal signal and morphology. Posterior Fossa, vertebral arteries, paraspinal tissues: Negative Disc levels: C1-2: Unremarkable. C2-3: Small central disc protrusion. There is no spinal canal stenosis. No neural foraminal stenosis. C3-4: Normal disc space and facet joints. There is no spinal canal stenosis. No neural foraminal stenosis. C4-5: Normal disc space and facet joints. There is no spinal canal stenosis. No neural foraminal stenosis. C5-6: Small disc bulge. There is no spinal canal stenosis. Mild left neural foraminal stenosis. C6-7: Normal disc space and facet joints. There is no spinal canal stenosis. No neural foraminal stenosis. C7-T1: Normal disc space and facet joints. There is no spinal canal stenosis. No neural foraminal stenosis. MRI THORACIC SPINE FINDINGS Alignment:  Physiologic. Vertebrae: There are multiple expansile, contrast-enhancing masses. There are circumferential lesions at T2 and T3 that encroach on the spinal canal, particularly at right T3. There is a similar lesion of T6 that also encroaches on the spinal canal and causes severe stenosis of the thecal sac. Mild T4 compression deformity. Edema  at T4 has resolved. Cord:  Normal signal and morphology. Paraspinal and other soft tissues: Pulmonary nodules are better assessed on the earlier CT. Disc levels: The thecal sac is effaced at the T2, T3 and T6 levels. There is moderate mass effect on the spinal  cord at the T6 level. MRI LUMBAR SPINE FINDINGS Segmentation:  Standard. Alignment:  Normal Vertebrae: No fracture, evidence of discitis, or bone lesion. L1 hemangioma. Conus medullaris and cauda equina: Conus extends to the L1 level. Conus and cauda equina appear normal. Paraspinal and other soft tissues: Negative Disc levels: L1-L2: Normal disc space and facet joints. No spinal canal stenosis. No neural foraminal stenosis. L2-L3: Normal disc space and facet joints. No spinal canal stenosis. No neural foraminal stenosis. L3-L4: Normal disc space and facet joints. No spinal canal stenosis. No neural foraminal stenosis. L4-L5: Left asymmetric disc bulge with narrowing of left lateral recess and posterior displacement of the left L5 nerve root. No central spinal canal stenosis. No neural foraminal stenosis. L5-S1: Left asymmetric disc bulge with mild left lateral recess narrowing and posterior displacement of the left S1 nerve root. No spinal canal stenosis. No neural foraminal stenosis. Visualized sacrum: Normal. IMPRESSION: 1. Multiple expansile, contrast-enhancing masses of the upper thoracic spine with epidural extension causing encroachment on the thecal sac. Moderate mass effect on the spinal cord at the T6 level 2. No metastatic disease of the cervical or lumbar spine. 3. Left lateral recess narrowing at L4-5 and L5-S1 with posterior displacement of the left L5 and S1 nerve roots. 4. Resolution of edema at T4 without progression of height loss. Electronically Signed   By: Ulyses Jarred M.D.   On: 10/13/2022 00:20   MR Lumbar Spine W Wo Contrast  Result Date: 10/13/2022 CLINICAL DATA:  Osseous metastatic disease EXAM: MRI CERVICAL, THORACIC AND LUMBAR  SPINE WITHOUT AND WITH CONTRAST TECHNIQUE: Multiplanar and multiecho pulse sequences of the cervical spine, to include the craniocervical junction and cervicothoracic junction, and thoracic and lumbar spine, were obtained without and with intravenous contrast. CONTRAST:  66m GADAVIST GADOBUTROL 1 MMOL/ML IV SOLN COMPARISON:  None available FINDINGS: MRI CERVICAL SPINE FINDINGS Alignment: Normal Vertebrae: No fracture, evidence of discitis, or bone lesion. Cord: Normal signal and morphology. Posterior Fossa, vertebral arteries, paraspinal tissues: Negative Disc levels: C1-2: Unremarkable. C2-3: Small central disc protrusion. There is no spinal canal stenosis. No neural foraminal stenosis. C3-4: Normal disc space and facet joints. There is no spinal canal stenosis. No neural foraminal stenosis. C4-5: Normal disc space and facet joints. There is no spinal canal stenosis. No neural foraminal stenosis. C5-6: Small disc bulge. There is no spinal canal stenosis. Mild left neural foraminal stenosis. C6-7: Normal disc space and facet joints. There is no spinal canal stenosis. No neural foraminal stenosis. C7-T1: Normal disc space and facet joints. There is no spinal canal stenosis. No neural foraminal stenosis. MRI THORACIC SPINE FINDINGS Alignment:  Physiologic. Vertebrae: There are multiple expansile, contrast-enhancing masses. There are circumferential lesions at T2 and T3 that encroach on the spinal canal, particularly at right T3. There is a similar lesion of T6 that also encroaches on the spinal canal and causes severe stenosis of the thecal sac. Mild T4 compression deformity. Edema at T4 has resolved. Cord:  Normal signal and morphology. Paraspinal and other soft tissues: Pulmonary nodules are better assessed on the earlier CT. Disc levels: The thecal sac is effaced at the T2, T3 and T6 levels. There is moderate mass effect on the spinal cord at the T6 level. MRI LUMBAR SPINE FINDINGS Segmentation:  Standard.  Alignment:  Normal Vertebrae: No fracture, evidence of discitis, or bone lesion. L1 hemangioma. Conus medullaris and cauda equina: Conus extends to the L1 level. Conus and cauda equina appear normal. Paraspinal and other soft tissues: Negative  Disc levels: L1-L2: Normal disc space and facet joints. No spinal canal stenosis. No neural foraminal stenosis. L2-L3: Normal disc space and facet joints. No spinal canal stenosis. No neural foraminal stenosis. L3-L4: Normal disc space and facet joints. No spinal canal stenosis. No neural foraminal stenosis. L4-L5: Left asymmetric disc bulge with narrowing of left lateral recess and posterior displacement of the left L5 nerve root. No central spinal canal stenosis. No neural foraminal stenosis. L5-S1: Left asymmetric disc bulge with mild left lateral recess narrowing and posterior displacement of the left S1 nerve root. No spinal canal stenosis. No neural foraminal stenosis. Visualized sacrum: Normal. IMPRESSION: 1. Multiple expansile, contrast-enhancing masses of the upper thoracic spine with epidural extension causing encroachment on the thecal sac. Moderate mass effect on the spinal cord at the T6 level 2. No metastatic disease of the cervical or lumbar spine. 3. Left lateral recess narrowing at L4-5 and L5-S1 with posterior displacement of the left L5 and S1 nerve roots. 4. Resolution of edema at T4 without progression of height loss. Electronically Signed   By: Ulyses Jarred M.D.   On: 10/13/2022 00:20   MR CERVICAL SPINE W WO CONTRAST  Result Date: 10/13/2022 CLINICAL DATA:  Osseous metastatic disease EXAM: MRI CERVICAL, THORACIC AND LUMBAR SPINE WITHOUT AND WITH CONTRAST TECHNIQUE: Multiplanar and multiecho pulse sequences of the cervical spine, to include the craniocervical junction and cervicothoracic junction, and thoracic and lumbar spine, were obtained without and with intravenous contrast. CONTRAST:  53m GADAVIST GADOBUTROL 1 MMOL/ML IV SOLN COMPARISON:  None  available FINDINGS: MRI CERVICAL SPINE FINDINGS Alignment: Normal Vertebrae: No fracture, evidence of discitis, or bone lesion. Cord: Normal signal and morphology. Posterior Fossa, vertebral arteries, paraspinal tissues: Negative Disc levels: C1-2: Unremarkable. C2-3: Small central disc protrusion. There is no spinal canal stenosis. No neural foraminal stenosis. C3-4: Normal disc space and facet joints. There is no spinal canal stenosis. No neural foraminal stenosis. C4-5: Normal disc space and facet joints. There is no spinal canal stenosis. No neural foraminal stenosis. C5-6: Small disc bulge. There is no spinal canal stenosis. Mild left neural foraminal stenosis. C6-7: Normal disc space and facet joints. There is no spinal canal stenosis. No neural foraminal stenosis. C7-T1: Normal disc space and facet joints. There is no spinal canal stenosis. No neural foraminal stenosis. MRI THORACIC SPINE FINDINGS Alignment:  Physiologic. Vertebrae: There are multiple expansile, contrast-enhancing masses. There are circumferential lesions at T2 and T3 that encroach on the spinal canal, particularly at right T3. There is a similar lesion of T6 that also encroaches on the spinal canal and causes severe stenosis of the thecal sac. Mild T4 compression deformity. Edema at T4 has resolved. Cord:  Normal signal and morphology. Paraspinal and other soft tissues: Pulmonary nodules are better assessed on the earlier CT. Disc levels: The thecal sac is effaced at the T2, T3 and T6 levels. There is moderate mass effect on the spinal cord at the T6 level. MRI LUMBAR SPINE FINDINGS Segmentation:  Standard. Alignment:  Normal Vertebrae: No fracture, evidence of discitis, or bone lesion. L1 hemangioma. Conus medullaris and cauda equina: Conus extends to the L1 level. Conus and cauda equina appear normal. Paraspinal and other soft tissues: Negative Disc levels: L1-L2: Normal disc space and facet joints. No spinal canal stenosis. No neural  foraminal stenosis. L2-L3: Normal disc space and facet joints. No spinal canal stenosis. No neural foraminal stenosis. L3-L4: Normal disc space and facet joints. No spinal canal stenosis. No neural foraminal stenosis. L4-L5: Left  asymmetric disc bulge with narrowing of left lateral recess and posterior displacement of the left L5 nerve root. No central spinal canal stenosis. No neural foraminal stenosis. L5-S1: Left asymmetric disc bulge with mild left lateral recess narrowing and posterior displacement of the left S1 nerve root. No spinal canal stenosis. No neural foraminal stenosis. Visualized sacrum: Normal. IMPRESSION: 1. Multiple expansile, contrast-enhancing masses of the upper thoracic spine with epidural extension causing encroachment on the thecal sac. Moderate mass effect on the spinal cord at the T6 level 2. No metastatic disease of the cervical or lumbar spine. 3. Left lateral recess narrowing at L4-5 and L5-S1 with posterior displacement of the left L5 and S1 nerve roots. 4. Resolution of edema at T4 without progression of height loss. Electronically Signed   By: Ulyses Jarred M.D.   On: 10/13/2022 00:20   CT Angio Chest PE W/Cm &/Or Wo Cm  Result Date: 10/12/2022 CLINICAL DATA:  History of cholangiocarcinoma with right-sided chest pain radiating into the back EXAM: CT ANGIOGRAPHY CHEST WITH CONTRAST TECHNIQUE: Multidetector CT imaging of the chest was performed using the standard protocol during bolus administration of intravenous contrast. Multiplanar CT image reconstructions and MIPs were obtained to evaluate the vascular anatomy. RADIATION DOSE REDUCTION: This exam was performed according to the departmental dose-optimization program which includes automated exposure control, adjustment of the mA and/or kV according to patient size and/or use of iterative reconstruction technique. CONTRAST:  44m OMNIPAQUE IOHEXOL 350 MG/ML SOLN COMPARISON:  CT chest dated 10/26/2021 FINDINGS: Cardiovascular: The  study is high quality for the evaluation of pulmonary embolism. There are no filling defects in the central, lobar, segmental or subsegmental pulmonary artery branches to suggest acute pulmonary embolism. Ascending aorta measures 4.1 cm, unchanged. Right chest wall port terminates at the superior cavoatrial junction. Normal heart size. No significant pericardial fluid/thickening. Coronary artery calcifications and aortic atherosclerosis. Mediastinum/Nodes: Imaged thyroid gland without nodules meeting criteria for imaging follow-up by size. Normal esophagus. Unchanged 10 mm right hilar lymph node (5:62). Lungs/Pleura: The central airways are patent. Scattered pulmonary nodules are new or slightly increased in size, for example left upper lobe 6 x 5 mm nodule (6:29), previously 2 mm, subpleural left upper lobe nodule measuring 5 x 4 mm (6:67), previously 2 mm, and subpleural 4 x 4 mm right lower lobe nodule (6:60). New 2 mm subpleural left upper lobe ground-glass nodule (6:36) and 3 mm left lower lobe nodule (6:89). Unchanged left lower lobe 3 mm perifissural nodule (6:74). No focal consolidation. No pneumothorax. No pleural effusion. Upper abdomen: Increased atrophic appearance of the right hepatic lobe. Additional multifocal hypoattenuating foci measuring up to 3.4 cm in segment 2/3 (5:128) are new. Musculoskeletal: Lytic and sclerotic appearance of T2 and T6 and right transverse process of T3 are new. Similar compression deformity of T4 with increased lytic appearance. Review of the MIP images confirms the above findings. IMPRESSION: 1. No acute pulmonary embolism. 2. New and increased size of scattered pulmonary nodules, suspicious for metastatic disease. 3. New multifocal hypoattenuating foci in the liver, suspicious for metastatic disease. 4. New lytic and sclerotic appearance of T2 and T6 and right transverse process of T3, suspicious for osseous metastatic disease. Similar compression deformity of T4 with  increased lytic appearance. 5. Coronary artery calcifications. Aortic Atherosclerosis (ICD10-I70.0). Electronically Signed   By: LDarrin NipperM.D.   On: 10/12/2022 14:21   DG Chest 2 View  Result Date: 10/12/2022 CLINICAL DATA:  Chest pain. EXAM: CHEST - 2 VIEW COMPARISON:  May 13, 2021. FINDINGS: The heart size and mediastinal contours are within normal limits. Both lungs are clear. Right internal jugular Port-A-Cath is noted with distal tip in expected position of the SVC. The visualized skeletal structures are unremarkable. IMPRESSION: No active cardiopulmonary disease. Electronically Signed   By: Marijo Conception M.D.   On: 10/12/2022 11:48       IMPRESSION/PLAN: 1. Metastatic Cholangiocarcinoma with multilevel disease in the thoracic spine resulting in spinal cord compression. Dr. Lisbeth Renshaw is aware of the patient's current situation and would offer a consideration of palliative radiotherapy. Since his last infusion of FOLFOX was in October, he would be a candidate for 2 weeks of palliative radiotherapy to the thoracic spine, however if he were to resume systemic therapy in the midst of starting radiation, we would prefer a 3 week course of radiation. The patient's son has been contacted to clarify whether they want transfer to [place], and has indicated  the patient would like to stay in [place] for radiation.  We discussed the risks, benefits, short, and long term effects of radiotherapy, as well as the palliative intent, and the patient is interested in proceeding. We reviewed the delivery and logistics of radiotherapy and will proceed with simulation today if he can transfer to [facility]. We agree with dexamethasone but will change his dose to Dexamethasone 4 mg, one tab PO TID, and we will organize taper at the appropriate time. We anticipate starting his 2 week course of therapy tomorrow. Jason Lyons is aware his father will not likely need to remain inpatient for his entire radiation.    In a  visit lasting 75 minutes, greater than 50% of the time was spent by phone and in floor time discussing the patient's condition, in preparation for the discussion, and coordinating the patient's care.        Carola Rhine, [facility]   **Disclaimer: This note was dictated with voice recognition software. Similar sounding words can inadvertently be transcribed and this note may contain transcription errors which may not have been corrected upon publication of note.**

## 2022-10-13 NOTE — Progress Notes (Addendum)
ANTICOAGULATION CONSULT NOTE - Initial Consult  Pharmacy Consult for Heparin (Apixaban on hold) Indication: History of PE  No Known Allergies  Vital Signs: Temp: 97.9 F (36.6 C) (01/03 1300) BP: 138/92 (01/03 1014) Pulse Rate: 79 (01/03 1014)  Labs: Recent Labs    10/12/22 1103 10/12/22 1254 10/13/22 1430 10/13/22 1450  HGB 14.3  --   --   --   HCT 43.3  --   --   --   PLT 132*  --   --   --   APTT  --   --  66*  --   HEPARINUNFRC  --   --   --  0.46  CREATININE 0.75  --   --   --   TROPONINIHS 4 5  --   --      CrCl cannot be calculated (Unknown ideal weight.).   Medical History: No past medical history on file.  Assessment: 67 y/o M on apixaban PTA for history of PE, needing neurosurgery evaluation, holding apixaban and starting heparin, last dose of apixaban was 12/31, ok to start heparin now.  PM update: - aPTT therapeutic at 66 seconds but at low end of goal range. Heparin level therapeutic at 0.46. Appears as aPTT and HL are correlating so will proceed with dosing per heparin levels only.  Goal of Therapy:  Heparin level 0.3-0.7 units/ml aPTT 66-102 seconds Monitor platelets by anticoagulation protocol: Yes   Plan:  Continue heparin drip at 850 units/hr Check 6h heparin level Daily CBC, heparin level Monitor for bleeding F/u ability to transition back to [place], PharmD, BCPS 10/13/2022 5:03 PM

## 2022-10-14 ENCOUNTER — Ambulatory Visit
Admit: 2022-10-14 | Discharge: 2022-10-14 | Disposition: A | Discharge status: Home / Self Care | Payer: Medicare Other | Attending: Radiation Oncology | Admitting: Radiation Oncology

## 2022-10-14 ENCOUNTER — Encounter (HOSPITAL_COMMUNITY): Payer: Self-pay | Admitting: Family Medicine

## 2022-10-14 ENCOUNTER — Other Ambulatory Visit: Payer: Self-pay

## 2022-10-14 VITALS — BP 115/73 | HR 74 | Resp 18

## 2022-10-14 DIAGNOSIS — G8220 Paraplegia, unspecified: Secondary | ICD-10-CM | POA: Diagnosis not present

## 2022-10-14 DIAGNOSIS — C7951 Secondary malignant neoplasm of bone: Secondary | ICD-10-CM

## 2022-10-14 LAB — RAD ONC ARIA SESSION SUMMARY
Course Elapsed Days: 0
Plan Fractions Treated to Date: 1
Plan Fractions Treated to Date: 1
Plan Prescribed Dose Per Fraction: 3 Gy
Plan Prescribed Dose Per Fraction: 3 Gy
Plan Total Fractions Prescribed: 10
Plan Total Fractions Prescribed: 6
Plan Total Prescribed Dose: 18 Gy
Plan Total Prescribed Dose: 30 Gy
Reference Point Dosage Given to Date: 3 Gy
Reference Point Dosage Given to Date: 3 Gy
Reference Point Session Dosage Given: 3 Gy
Reference Point Session Dosage Given: 3 Gy
Session Number: 1

## 2022-10-14 LAB — CBC
HCT: 39.8 % (ref 39.0–52.0)
Hemoglobin: 12.9 g/dL — ABNORMAL LOW (ref 13.0–17.0)
MCH: 32.6 pg (ref 26.0–34.0)
MCHC: 32.4 g/dL (ref 30.0–36.0)
MCV: 100.5 fL — ABNORMAL HIGH (ref 80.0–100.0)
Platelets: 143 10*3/uL — ABNORMAL LOW (ref 150–400)
RBC: 3.96 MIL/uL — ABNORMAL LOW (ref 4.22–5.81)
RDW: 15.2 % (ref 11.5–15.5)
WBC: 7 10*3/uL (ref 4.0–10.5)
nRBC: 0 % (ref 0.0–0.2)

## 2022-10-14 LAB — COMPREHENSIVE METABOLIC PANEL
ALT: 36 U/L (ref 0–44)
AST: 39 U/L (ref 15–41)
Albumin: 3.1 g/dL — ABNORMAL LOW (ref 3.5–5.0)
Alkaline Phosphatase: 238 U/L — ABNORMAL HIGH (ref 38–126)
Anion gap: 8 (ref 5–15)
BUN: 25 mg/dL — ABNORMAL HIGH (ref 8–23)
CO2: 25 mmol/L (ref 22–32)
Calcium: 9 mg/dL (ref 8.9–10.3)
Chloride: 103 mmol/L (ref 98–111)
Creatinine, Ser: 0.88 mg/dL (ref 0.61–1.24)
GFR, Estimated: >60 mL/min (ref >60)
Glucose, Bld: 157 mg/dL — ABNORMAL HIGH (ref 70–99)
Potassium: 4.1 mmol/L (ref 3.5–5.1)
Sodium: 136 mmol/L (ref 135–145)
Total Bilirubin: 0.2 mg/dL — ABNORMAL LOW (ref 0.3–1.2)
Total Protein: 6.2 g/dL — ABNORMAL LOW (ref 6.5–8.1)

## 2022-10-14 LAB — HEPARIN LEVEL (UNFRACTIONATED)
Heparin Unfractionated: 0.75 IU/mL — ABNORMAL HIGH (ref 0.30–0.70)
Heparin Unfractionated: 0.7 IU/mL (ref 0.30–0.70)

## 2022-10-14 LAB — APTT: aPTT: 68 seconds — ABNORMAL HIGH (ref 24–36)

## 2022-10-14 MED ORDER — APIXABAN 5 MG PO TABS
5.0000 mg | ORAL_TABLET | Freq: Two times a day (BID) | ORAL | Status: DC
  Administered 2022-10-14 – 2022-10-29 (×30): 5 mg via ORAL
  Filled 2022-10-14 (×30): qty 1

## 2022-10-14 MED ORDER — MORPHINE SULFATE (PF) 4 MG/ML IV SOLN
2.0000 mg | Freq: Once | INTRAVENOUS | Status: AC
  Administered 2022-10-14: 2 mg via INTRAVENOUS
  Filled 2022-10-14: qty 0.5

## 2022-10-14 NOTE — Progress Notes (Signed)
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 He sees oncologist at [facility] and has been receiving chemotherapy for last couple of years. Has a history of T4 metastatic lesion in the past received radiation for that. Patient was discussed with neurosurgery who recommended possible radiation to the T6. Patient was admitted to hospital service.  Oncology and radiation oncology was consulted, they recommended transfer to [facility] for radiation.  Neurosurgery saw patient as well.  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  Now being transferred to [facility] for radiation to the T6 lesion.  Anterior chest pain/scapular pain: Patient was complaining of scapular pain and left shoulder pain yesterday, likely secondary to metastasis.  He did not have any pain today.  Troponin completely normal.  ACS ruled out.  PE: He takes Eliquis at home but he is currently on heparin, will transition back to Eliquis when it is clear that there is no intervention planned.  DVT prophylaxis: SCDs Start: 10/13/22 0536 heparin drip   Code Status: Full Code  Family Communication:  None present at bedside.  Plan of care discussed with patient in length and he/she verbalized understanding and agreed with it.  Status is:  Inpatient Remains inpatient appropriate because: Patient to start radiation.   Estimated body mass index is 27.51 kg/m as calculated from the following:   Height as of 10/30/21: '5\' 2"'$  (1.575 m).   Weight as of 10/30/21: 68.2 kg.    Nutritional Assessment: There is no height or weight on file to calculate BMI.. Seen by dietician.  I agree with the assessment and plan as outlined below: Nutrition Status:        . Skin Assessment: I have examined the patient's skin and I agree with the wound assessment as performed by the wound care RN as outlined below:    Consultants:  Neurosurgery and radiation oncology  Procedures:  None  Antimicrobials:  Anti-infectives (From admission, onward)    None         Subjective: Patient seen and examined.  Still complains of significant weakness with no movement in bilateral lower extremities.  No other complaint.  No chest pain today.  Objective: Vitals:   10/14/22 0739 10/14/22 0740 10/14/22 0800 10/14/22 0849  BP: 108/68  98/71 115/71  Pulse: 74  78 81  Resp: '16  12 19  '$ Temp:  97.6 F (36.4 C)    TempSrc:  Oral    SpO2: 97%  95% 96%   No intake or output data in the 24 hours ending 10/14/22 0946 There were no vitals filed for this visit.  Examination:  General exam: Appears calm and comfortable  Respiratory system: Clear to auscultation. Respiratory effort normal. Cardiovascular system: S1 & S2 heard, RRR. No JVD, murmurs, rubs, gallops or clicks. No pedal edema. Gastrointestinal system: Abdomen  is nondistended, soft and nontender. No organomegaly or masses felt. Normal bowel sounds heard. Central nervous system: Alert and oriented.  Paraplegia. Extremities: Symmetric 5 x 5 power. Skin: No rashes, lesions or ulcers Psychiatry: Judgement and insight appear normal. Mood & affect appropriate.    Data Reviewed: I have personally reviewed following labs and imaging studies  CBC: Recent Labs  Lab 10/12/22 1103  10/14/22 0410  WBC 5.6 7.0  NEUTROABS 3.7  --   HGB 14.3 12.9*  HCT 43.3 39.8  MCV 100.7* 100.5*  PLT 132* 314*   Basic Metabolic Panel: Recent Labs  Lab 10/12/22 1103 10/14/22 0410  NA 139 136  K 4.1 4.1  CL 102 103  CO2 27 25  GLUCOSE 118* 157*  BUN 13 25*  CREATININE 0.75 0.88  CALCIUM 9.3 9.0   GFR: CrCl cannot be calculated (Unknown ideal weight.). Liver Function Tests: Recent Labs  Lab 10/14/22 0410  AST 39  ALT 36  ALKPHOS 238*  BILITOT 0.2*  PROT 6.2*  ALBUMIN 3.1*   No results for input(s): "LIPASE", "AMYLASE" in the last 168 hours. No results for input(s): "AMMONIA" in the last 168 hours. Coagulation Profile: No results for input(s): "INR", "PROTIME" in the last 168 hours. Cardiac Enzymes: No results for input(s): "CKTOTAL", "CKMB", "CKMBINDEX", "TROPONINI" in the last 168 hours. BNP (last 3 results) No results for input(s): "PROBNP" in the last 8760 hours. HbA1C: No results for input(s): "HGBA1C" in the last 72 hours. CBG: No results for input(s): "GLUCAP" in the last 168 hours. Lipid Profile: No results for input(s): "CHOL", "HDL", "LDLCALC", "TRIG", "CHOLHDL", "LDLDIRECT" in the last 72 hours. Thyroid Function Tests: No results for input(s): "TSH", "T4TOTAL", "FREET4", "T3FREE", "THYROIDAB" in the last 72 hours. Anemia Panel: No results for input(s): "VITAMINB12", "FOLATE", "FERRITIN", "TIBC", "IRON", "RETICCTPCT" in the last 72 hours. Sepsis Labs: No results for input(s): "PROCALCITON", "LATICACIDVEN" in the last 168 hours.  No results found for this or any previous visit (from the past 240 hour(s)).   Radiology Studies: MR THORACIC SPINE W WO CONTRAST  Result Date: 10/13/2022 CLINICAL DATA:  Osseous metastatic disease EXAM: MRI CERVICAL, THORACIC AND LUMBAR SPINE WITHOUT AND WITH CONTRAST TECHNIQUE: Multiplanar and multiecho pulse sequences of the cervical spine, to include the craniocervical junction and cervicothoracic junction, and  thoracic and lumbar spine, were obtained without and with intravenous contrast. CONTRAST:  92m GADAVIST GADOBUTROL 1 MMOL/ML IV SOLN COMPARISON:  None available FINDINGS: MRI CERVICAL SPINE FINDINGS Alignment: Normal Vertebrae: No fracture, evidence of discitis, or bone lesion. Cord: Normal signal and morphology. Posterior Fossa, vertebral arteries, paraspinal tissues: Negative Disc levels: C1-2: Unremarkable. C2-3: Small central disc protrusion. There is no spinal canal stenosis. No neural foraminal stenosis. C3-4: Normal disc space and facet joints. There is no spinal canal stenosis. No neural foraminal stenosis. C4-5: Normal disc space and facet joints. There is no spinal canal stenosis. No neural foraminal stenosis. C5-6: Small disc bulge. There is no spinal canal stenosis. Mild left neural foraminal stenosis. C6-7: Normal disc space and facet joints. There is no spinal canal stenosis. No neural foraminal stenosis. C7-T1: Normal disc space and facet joints. There is no spinal canal stenosis. No neural foraminal stenosis. MRI THORACIC SPINE FINDINGS Alignment:  Physiologic. Vertebrae: There are multiple expansile, contrast-enhancing masses. There are circumferential lesions at T2 and T3 that encroach on the spinal canal, particularly at right T3. There is a similar lesion of T6 that also encroaches on the spinal canal and causes severe stenosis of the  thecal sac. Mild T4 compression deformity. Edema at T4 has resolved. Cord:  Normal signal and morphology. Paraspinal and other soft tissues: Pulmonary nodules are better assessed on the earlier CT. Disc levels: The thecal sac is effaced at the T2, T3 and T6 levels. There is moderate mass effect on the spinal cord at the T6 level. MRI LUMBAR SPINE FINDINGS Segmentation:  Standard. Alignment:  Normal Vertebrae: No fracture, evidence of discitis, or bone lesion. L1 hemangioma. Conus medullaris and cauda equina: Conus extends to the L1 level. Conus and cauda equina  appear normal. Paraspinal and other soft tissues: Negative Disc levels: L1-L2: Normal disc space and facet joints. No spinal canal stenosis. No neural foraminal stenosis. L2-L3: Normal disc space and facet joints. No spinal canal stenosis. No neural foraminal stenosis. L3-L4: Normal disc space and facet joints. No spinal canal stenosis. No neural foraminal stenosis. L4-L5: Left asymmetric disc bulge with narrowing of left lateral recess and posterior displacement of the left L5 nerve root. No central spinal canal stenosis. No neural foraminal stenosis. L5-S1: Left asymmetric disc bulge with mild left lateral recess narrowing and posterior displacement of the left S1 nerve root. No spinal canal stenosis. No neural foraminal stenosis. Visualized sacrum: Normal. IMPRESSION: 1. Multiple expansile, contrast-enhancing masses of the upper thoracic spine with epidural extension causing encroachment on the thecal sac. Moderate mass effect on the spinal cord at the T6 level 2. No metastatic disease of the cervical or lumbar spine. 3. Left lateral recess narrowing at L4-5 and L5-S1 with posterior displacement of the left L5 and S1 nerve roots. 4. Resolution of edema at T4 without progression of height loss. Electronically Signed   By: Ulyses Jarred M.D.   On: 10/13/2022 00:20   MR Lumbar Spine W Wo Contrast  Result Date: 10/13/2022 CLINICAL DATA:  Osseous metastatic disease EXAM: MRI CERVICAL, THORACIC AND LUMBAR SPINE WITHOUT AND WITH CONTRAST TECHNIQUE: Multiplanar and multiecho pulse sequences of the cervical spine, to include the craniocervical junction and cervicothoracic junction, and thoracic and lumbar spine, were obtained without and with intravenous contrast. CONTRAST:  63m GADAVIST GADOBUTROL 1 MMOL/ML IV SOLN COMPARISON:  None available FINDINGS: MRI CERVICAL SPINE FINDINGS Alignment: Normal Vertebrae: No fracture, evidence of discitis, or bone lesion. Cord: Normal signal and morphology. Posterior Fossa,  vertebral arteries, paraspinal tissues: Negative Disc levels: C1-2: Unremarkable. C2-3: Small central disc protrusion. There is no spinal canal stenosis. No neural foraminal stenosis. C3-4: Normal disc space and facet joints. There is no spinal canal stenosis. No neural foraminal stenosis. C4-5: Normal disc space and facet joints. There is no spinal canal stenosis. No neural foraminal stenosis. C5-6: Small disc bulge. There is no spinal canal stenosis. Mild left neural foraminal stenosis. C6-7: Normal disc space and facet joints. There is no spinal canal stenosis. No neural foraminal stenosis. C7-T1: Normal disc space and facet joints. There is no spinal canal stenosis. No neural foraminal stenosis. MRI THORACIC SPINE FINDINGS Alignment:  Physiologic. Vertebrae: There are multiple expansile, contrast-enhancing masses. There are circumferential lesions at T2 and T3 that encroach on the spinal canal, particularly at right T3. There is a similar lesion of T6 that also encroaches on the spinal canal and causes severe stenosis of the thecal sac. Mild T4 compression deformity. Edema at T4 has resolved. Cord:  Normal signal and morphology. Paraspinal and other soft tissues: Pulmonary nodules are better assessed on the earlier CT. Disc levels: The thecal sac is effaced at the T2, T3 and T6 levels. There is moderate mass  effect on the spinal cord at the T6 level. MRI LUMBAR SPINE FINDINGS Segmentation:  Standard. Alignment:  Normal Vertebrae: No fracture, evidence of discitis, or bone lesion. L1 hemangioma. Conus medullaris and cauda equina: Conus extends to the L1 level. Conus and cauda equina appear normal. Paraspinal and other soft tissues: Negative Disc levels: L1-L2: Normal disc space and facet joints. No spinal canal stenosis. No neural foraminal stenosis. L2-L3: Normal disc space and facet joints. No spinal canal stenosis. No neural foraminal stenosis. L3-L4: Normal disc space and facet joints. No spinal canal  stenosis. No neural foraminal stenosis. L4-L5: Left asymmetric disc bulge with narrowing of left lateral recess and posterior displacement of the left L5 nerve root. No central spinal canal stenosis. No neural foraminal stenosis. L5-S1: Left asymmetric disc bulge with mild left lateral recess narrowing and posterior displacement of the left S1 nerve root. No spinal canal stenosis. No neural foraminal stenosis. Visualized sacrum: Normal. IMPRESSION: 1. Multiple expansile, contrast-enhancing masses of the upper thoracic spine with epidural extension causing encroachment on the thecal sac. Moderate mass effect on the spinal cord at the T6 level 2. No metastatic disease of the cervical or lumbar spine. 3. Left lateral recess narrowing at L4-5 and L5-S1 with posterior displacement of the left L5 and S1 nerve roots. 4. Resolution of edema at T4 without progression of height loss. Electronically Signed   By: Ulyses Jarred M.D.   On: 10/13/2022 00:20   MR CERVICAL SPINE W WO CONTRAST  Result Date: 10/13/2022 CLINICAL DATA:  Osseous metastatic disease EXAM: MRI CERVICAL, THORACIC AND LUMBAR SPINE WITHOUT AND WITH CONTRAST TECHNIQUE: Multiplanar and multiecho pulse sequences of the cervical spine, to include the craniocervical junction and cervicothoracic junction, and thoracic and lumbar spine, were obtained without and with intravenous contrast. CONTRAST:  58m GADAVIST GADOBUTROL 1 MMOL/ML IV SOLN COMPARISON:  None available FINDINGS: MRI CERVICAL SPINE FINDINGS Alignment: Normal Vertebrae: No fracture, evidence of discitis, or bone lesion. Cord: Normal signal and morphology. Posterior Fossa, vertebral arteries, paraspinal tissues: Negative Disc levels: C1-2: Unremarkable. C2-3: Small central disc protrusion. There is no spinal canal stenosis. No neural foraminal stenosis. C3-4: Normal disc space and facet joints. There is no spinal canal stenosis. No neural foraminal stenosis. C4-5: Normal disc space and facet joints.  There is no spinal canal stenosis. No neural foraminal stenosis. C5-6: Small disc bulge. There is no spinal canal stenosis. Mild left neural foraminal stenosis. C6-7: Normal disc space and facet joints. There is no spinal canal stenosis. No neural foraminal stenosis. C7-T1: Normal disc space and facet joints. There is no spinal canal stenosis. No neural foraminal stenosis. MRI THORACIC SPINE FINDINGS Alignment:  Physiologic. Vertebrae: There are multiple expansile, contrast-enhancing masses. There are circumferential lesions at T2 and T3 that encroach on the spinal canal, particularly at right T3. There is a similar lesion of T6 that also encroaches on the spinal canal and causes severe stenosis of the thecal sac. Mild T4 compression deformity. Edema at T4 has resolved. Cord:  Normal signal and morphology. Paraspinal and other soft tissues: Pulmonary nodules are better assessed on the earlier CT. Disc levels: The thecal sac is effaced at the T2, T3 and T6 levels. There is moderate mass effect on the spinal cord at the T6 level. MRI LUMBAR SPINE FINDINGS Segmentation:  Standard. Alignment:  Normal Vertebrae: No fracture, evidence of discitis, or bone lesion. L1 hemangioma. Conus medullaris and cauda equina: Conus extends to the L1 level. Conus and cauda equina appear normal. Paraspinal and  other soft tissues: Negative Disc levels: L1-L2: Normal disc space and facet joints. No spinal canal stenosis. No neural foraminal stenosis. L2-L3: Normal disc space and facet joints. No spinal canal stenosis. No neural foraminal stenosis. L3-L4: Normal disc space and facet joints. No spinal canal stenosis. No neural foraminal stenosis. L4-L5: Left asymmetric disc bulge with narrowing of left lateral recess and posterior displacement of the left L5 nerve root. No central spinal canal stenosis. No neural foraminal stenosis. L5-S1: Left asymmetric disc bulge with mild left lateral recess narrowing and posterior displacement of the  left S1 nerve root. No spinal canal stenosis. No neural foraminal stenosis. Visualized sacrum: Normal. IMPRESSION: 1. Multiple expansile, contrast-enhancing masses of the upper thoracic spine with epidural extension causing encroachment on the thecal sac. Moderate mass effect on the spinal cord at the T6 level 2. No metastatic disease of the cervical or lumbar spine. 3. Left lateral recess narrowing at L4-5 and L5-S1 with posterior displacement of the left L5 and S1 nerve roots. 4. Resolution of edema at T4 without progression of height loss. Electronically Signed   By: Ulyses Jarred M.D.   On: 10/13/2022 00:20   CT Angio Chest PE W/Cm &/Or Wo Cm  Result Date: 10/12/2022 CLINICAL DATA:  History of cholangiocarcinoma with right-sided chest pain radiating into the back EXAM: CT ANGIOGRAPHY CHEST WITH CONTRAST TECHNIQUE: Multidetector CT imaging of the chest was performed using the standard protocol during bolus administration of intravenous contrast. Multiplanar CT image reconstructions and MIPs were obtained to evaluate the vascular anatomy. RADIATION DOSE REDUCTION: This exam was performed according to the departmental dose-optimization program which includes automated exposure control, adjustment of the mA and/or kV according to patient size and/or use of iterative reconstruction technique. CONTRAST:  76m OMNIPAQUE IOHEXOL 350 MG/ML SOLN COMPARISON:  CT chest dated 10/26/2021 FINDINGS: Cardiovascular: The study is high quality for the evaluation of pulmonary embolism. There are no filling defects in the central, lobar, segmental or subsegmental pulmonary artery branches to suggest acute pulmonary embolism. Ascending aorta measures 4.1 cm, unchanged. Right chest wall port terminates at the superior cavoatrial junction. Normal heart size. No significant pericardial fluid/thickening. Coronary artery calcifications and aortic atherosclerosis. Mediastinum/Nodes: Imaged thyroid gland without nodules meeting criteria  for imaging follow-up by size. Normal esophagus. Unchanged 10 mm right hilar lymph node (5:62). Lungs/Pleura: The central airways are patent. Scattered pulmonary nodules are new or slightly increased in size, for example left upper lobe 6 x 5 mm nodule (6:29), previously 2 mm, subpleural left upper lobe nodule measuring 5 x 4 mm (6:67), previously 2 mm, and subpleural 4 x 4 mm right lower lobe nodule (6:60). New 2 mm subpleural left upper lobe ground-glass nodule (6:36) and 3 mm left lower lobe nodule (6:89). Unchanged left lower lobe 3 mm perifissural nodule (6:74). No focal consolidation. No pneumothorax. No pleural effusion. Upper abdomen: Increased atrophic appearance of the right hepatic lobe. Additional multifocal hypoattenuating foci measuring up to 3.4 cm in segment 2/3 (5:128) are new. Musculoskeletal: Lytic and sclerotic appearance of T2 and T6 and right transverse process of T3 are new. Similar compression deformity of T4 with increased lytic appearance. Review of the MIP images confirms the above findings. IMPRESSION: 1. No acute pulmonary embolism. 2. New and increased size of scattered pulmonary nodules, suspicious for metastatic disease. 3. New multifocal hypoattenuating foci in the liver, suspicious for metastatic disease. 4. New lytic and sclerotic appearance of T2 and T6 and right transverse process of T3, suspicious for osseous metastatic disease.  Similar compression deformity of T4 with increased lytic appearance. 5. Coronary artery calcifications. Aortic Atherosclerosis (ICD10-I70.0). Electronically Signed   By: Darrin Nipper M.D.   On: 10/12/2022 14:21   DG Chest 2 View  Result Date: 10/12/2022 CLINICAL DATA:  Chest pain. EXAM: CHEST - 2 VIEW COMPARISON:  May 13, 2021. FINDINGS: The heart size and mediastinal contours are within normal limits. Both lungs are clear. Right internal jugular Port-A-Cath is noted with distal tip in expected position of the SVC. The visualized skeletal structures  are unremarkable. IMPRESSION: No active cardiopulmonary disease. Electronically Signed   By: Marijo Conception M.D.   On: 10/12/2022 11:48    Scheduled Meds:  dexamethasone  4 mg Oral Q6H   senna  1 tablet Oral BID   Continuous Infusions:  heparin 850 Units/hr (10/14/22 0605)     LOS: 1 day   Darliss Cheney, MD [facility]  10/14/2022, 9:46 AM   *Please note that this is a verbal dictation therefore any spelling or grammatical errors are due to the "[employer]" system interpretation.  Please page via [place] and do not message via secure chat for urgent patient care matters. Secure chat can be used for non urgent patient care matters.  How to contact the [facility] Attending or Consulting provider [place] or covering provider during after hours [place], for this patient?  Check the care team in [facility] and look for a) attending/consulting [facility] provider listed and b) the [facility] team listed. Page or secure chat [phone]. Log into [email or web address] and use [place]'s universal password to access. If you do not have the password, please contact the hospital operator. Locate the [facility] provider you are looking for under [facility] and page to a number that you can be directly reached. If you still have difficulty reaching the provider, please page the [facility] (Director on Call) for the Hospitalists listed on amion for assistance.

## 2022-10-14 NOTE — Progress Notes (Signed)
Patient transported to the Emergency room, room 3, awaiting bed placement.  He will receive his first radiation treatment this afternoon.  All belongings transported with the patient.    Gloriajean Dell. Leonie Green, BSN

## 2022-10-14 NOTE — Progress Notes (Signed)
Pt was seen in the simulation department. His son Otho Ket listened in by speakerphone, and the encounter was with the assistance of medical interpretor services on the ipad in our clinic. We reviewed the findings of his imaging, the concerns about his paralysis which is notable with 0/5 strength and only intact sensation to light touch from the dorsal foot to mid thigh. He is having a good deal of pain in his mid-upper back and the patient was given Morphine 2 mg IV x1 in our clinic to tolerate simulation planning. Written consent is obtained and placed in the chart, a copy was provided to the patient. We are awaiting bed placement but the plan has been to either admit to [facility] directly from our department at the conclusion of simulation, or go to [facility] ED while awaiting bed placement. We intend to start his radiation today, but if not able to then BID treatment tomorrow.     Carola Rhine, PAC

## 2022-10-14 NOTE — ED Notes (Signed)
Report given to [facility] ED charge RN,  Lysbeth Galas.

## 2022-10-14 NOTE — ED Notes (Signed)
Patient transported to [facility] at this time.

## 2022-10-14 NOTE — Progress Notes (Signed)
ANTICOAGULATION CONSULT NOTE - Follow Up Consult  Pharmacy Consult for heparin Indication:  h/o PE  Labs: Recent Labs    10/12/22 1103 10/12/22 1254 10/13/22 1430 10/13/22 1450 10/13/22 2123 10/14/22 0410  HGB 14.3  --   --   --   --  12.9*  HCT 43.3  --   --   --   --  39.8  PLT 132*  --   --   --   --  143*  APTT  --   --  66*  --   --   --   HEPARINUNFRC  --   --   --  0.46 0.20* 0.75*  CREATININE 0.75  --   --   --   --  0.88  TROPONINIHS 4 5  --   --   --   --     Assessment: 67yo male supratherapeutic on heparin after one level at goal; no infusion issues or signs of bleeding per RN.  Goal of Therapy:  Heparin level 0.3-0.7 units/ml   Plan:  Will decrease heparin infusion back to 850 units/hr and check level in 8 hours.    Wynona Neat, PharmD, BCPS  10/14/2022,6:00 AM

## 2022-10-15 ENCOUNTER — Ambulatory Visit
Admit: 2022-10-15 | Discharge: 2022-10-15 | Disposition: A | Discharge status: Home / Self Care | Payer: Medicare Other | Attending: Radiation Oncology | Admitting: Radiation Oncology

## 2022-10-15 ENCOUNTER — Other Ambulatory Visit: Payer: Self-pay

## 2022-10-15 ENCOUNTER — Ambulatory Visit: Payer: Medicare Other

## 2022-10-15 DIAGNOSIS — G8220 Paraplegia, unspecified: Secondary | ICD-10-CM | POA: Diagnosis not present

## 2022-10-15 LAB — CBC
HCT: 36.8 % — ABNORMAL LOW (ref 39.0–52.0)
Hemoglobin: 12 g/dL — ABNORMAL LOW (ref 13.0–17.0)
MCH: 33 pg (ref 26.0–34.0)
MCHC: 32.6 g/dL (ref 30.0–36.0)
MCV: 101.1 fL — ABNORMAL HIGH (ref 80.0–100.0)
Platelets: 127 10*3/uL — ABNORMAL LOW (ref 150–400)
RBC: 3.64 MIL/uL — ABNORMAL LOW (ref 4.22–5.81)
RDW: 15.4 % (ref 11.5–15.5)
WBC: 7.5 10*3/uL (ref 4.0–10.5)
nRBC: 0 % (ref 0.0–0.2)

## 2022-10-15 LAB — RAD ONC ARIA SESSION SUMMARY
Course Elapsed Days: 1
Plan Fractions Treated to Date: 2
Plan Fractions Treated to Date: 2
Plan Prescribed Dose Per Fraction: 3 Gy
Plan Prescribed Dose Per Fraction: 3 Gy
Plan Total Fractions Prescribed: 10
Plan Total Fractions Prescribed: 6
Plan Total Prescribed Dose: 18 Gy
Plan Total Prescribed Dose: 30 Gy
Reference Point Dosage Given to Date: 6 Gy
Reference Point Dosage Given to Date: 6 Gy
Reference Point Session Dosage Given: 3 Gy
Reference Point Session Dosage Given: 3 Gy
Session Number: 2

## 2022-10-15 LAB — TROPONIN I (HIGH SENSITIVITY): Troponin I (High Sensitivity): 4 ng/L (ref <18)

## 2022-10-15 MED ORDER — ALUM & MAG HYDROXIDE-SIMETH 200-200-20 MG/5ML PO SUSP
30.0000 mL | Freq: Once | ORAL | Status: AC
  Administered 2022-10-15: 30 mL via ORAL
  Filled 2022-10-15: qty 30

## 2022-10-15 MED ORDER — POLYETHYLENE GLYCOL 3350 17 G PO PACK
17.0000 g | PACK | Freq: Every day | ORAL | Status: DC
  Administered 2022-10-15 – 2022-10-19 (×5): 17 g via ORAL
  Filled 2022-10-15 (×5): qty 1

## 2022-10-15 MED ORDER — ENSURE ENLIVE PO LIQD
237.0000 mL | Freq: Two times a day (BID) | ORAL | Status: DC
  Administered 2022-10-16 – 2022-10-22 (×10): 237 mL via ORAL

## 2022-10-15 NOTE — Progress Notes (Signed)
Initial Nutrition Assessment  DOCUMENTATION CODES:   Non-severe (moderate) malnutrition in context of chronic illness  INTERVENTION:  - Continue Regular diet. - Will change for patient to receive automatic house trays to ensure he receives 3 meals a day. - Ensure Plus High Protein po BID, each supplement provides 350 kcal and 20 grams of protein.  - Patient prefers chocolate. - Encourage intake at all meals and of supplements.  - Monitor weight trends.   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  GOAL:   Patient will meet greater than or equal to 90% of their needs  MONITOR:   PO intake, Supplement acceptance, Weight trends  REASON FOR ASSESSMENT:   Malnutrition Screening Tool    ASSESSMENT:   67 year old [place] speaking only gentleman with history of cholangiocarcinoma, metastatic to bone, who has been on chemotherapy every 3 weeks for at least the past 2 years. He has had prior mets to T4 spine which was treated with radiation therapy. 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   Met with patient at bedside. Used medical interpreter services on ipad to communicate with patient. He reports a UBW of 134# and notes his weight tends to fluctuate but but major changes in weight. Per EMR, weight stable.  Patient endorses eating small and frequent meals at home. Eats a lot of chicken and beef. Drinks Ensure once a day at home as well.  Current appetite is good although patient notes he did not have a breakfast today. Discussed importance of eating well during admission to avoid weight loss. Patient agreeable to receive Ensure to support intake, likes chocolate.  Will change in ordering system for patient to receive automatic house trays as he speaks vietnamese.    Medications reviewed and  include: Senokot  Labs reviewed:  -   NUTRITION - FOCUSED PHYSICAL EXAM:  Flowsheet Row Most Recent Value  Orbital Region Moderate depletion  Upper Arm Region Moderate depletion  Thoracic and Lumbar Region Moderate depletion  Buccal Region Moderate depletion  Temple Region Moderate depletion  Clavicle Bone Region Moderate depletion  Clavicle and Acromion Bone Region Moderate depletion  Scapular Bone Region Unable to assess  Dorsal Hand Mild depletion  Patellar Region Moderate depletion  Anterior Thigh Region Moderate depletion  Posterior Calf Region Moderate depletion  Edema (RD Assessment) None  Hair Reviewed  Eyes Reviewed  Mouth Reviewed  Skin Reviewed  Nails Reviewed       Diet Order:   Diet Order             Diet regular Room service appropriate? Yes; Fluid consistency: Thin  Diet effective now                   EDUCATION NEEDS:  Education needs have been addressed  Skin:  Skin Assessment: Reviewed RN Assessment  Last BM:  1/3  Height:  Ht Readings from Last 1 Encounters:  10/14/22 '5\' 5"'$  (1.651 m)   Weight:  Wt Readings from Last 1 Encounters:  10/14/22 61.8 kg    BMI:  Body mass index is 22.66 kg/m.  Estimated Nutritional Needs:  Kcal:  1850-2000 kcals Protein:  80-95 grams Fluid:  >/= 1.8L    Samson Frederic RD, LDN For contact information, refer to [facility].

## 2022-10-15 NOTE — Care Management Important Message (Signed)
Important Message  Patient Details IM Letter given. Name: Jason Lyons MRN: [id number] Date of Birth: 1955-12-23   Medicare Important Message Given:  Yes     Kerin Salen 10/15/2022, 11:05 AM

## 2022-10-15 NOTE — Progress Notes (Signed)
Inpatient Rehab Admissions Coordinator:   Per therapy recommendations pt was screened for CIR candidacy by Shann Medal, PT, DPT.  Do not feel pt would be able to tolerate CIR intensity at this time, given pain limiting therapy sessions.  Will follow distantly for progress and GOC.    Shann Medal, PT, DPT Admissions Coordinator [phone] 10/15/22  3:06 PM

## 2022-10-15 NOTE — Progress Notes (Signed)
PROGRESS NOTE   Jason Lyons  RJJ:[id number] DOB: 1956-07-02 DOA: 10/12/2022 PCP: Pcp, No  Brief Narrative:  67 year old [place] male with known adeno cancer of the biliary tract diagnosed 05/13/2021-followed by oncologist Dr. Rozetta Nunnery on cisplatin gemcitabine currently managed at [facility] on chemo there Known prior pulmonary embolism Pathological fracture T4 s/p XRt in the past as well  Seen at [facility] ED 10/07/2022 and because of pain in the back as well as numbness bilateral hands and feet was given oxycodone and discharge He has been unable to pass stool for 3 weeks he has control of his urine  Patient brought to [facility] ED 10/12/2022 with right-sided chest pain radiating to back-found to have T6 spinal mets and scattered new pulmonary nodules  Neurosurgery was consulted and did not feel that he was an operative candidate and he was given Decadron and patient was transferred over to [facility] for consideration of XRT which would be palliative  Hospital-Problem based course  Cauda equina syndrome at T6 level with encroachment of the thecal sac - Continue Decadron gram tablet every 6 hourly - XRT to be done as per radiation oncology - Will need physical therapy to see and assess and will last him to assess over we can - Discussed with patient's former oncologist-nothing further to add needs follow-up at [facility] - For pain oxycodone 5 every 4 as needed moderate, Dilaudid 1 mg every 4 as needed severe  Metastatic adenocarcinoma biliary tract diagnosed 05/30/2021 - Follows now at [place] follow-up  Severe constipation - Has not had a stool in over 3 weeks? - Start MiraLAX and senna scheduled - If no better add sorbitol  Anterior chest/scapular pain - None at this time  Previous pulmonary embolism -Continue Eliquis 5 twice daily  DVT prophylaxis: Eliquis Code Status: Full Family Communication: None Disposition:  Status is: Inpatient Remains inpatient  appropriate because:   Needs radiation therapy and therapy eval    Subjective: Awake coherent no distress looks comfortable Cannot sense when he passes urine Has not had a stool in 3 weeks   Objective: Vitals:   10/14/22 1432 10/14/22 1842 10/14/22 2205 10/15/22 0301  BP:  (!) 110/58 109/63 114/67  Pulse:  68 76 66  Resp:  '20 18 16  '$ Temp:  98 F (36.7 C) 98.1 F (36.7 C) 98 F (36.7 C)  TempSrc:  Oral Oral Oral  SpO2:  98% 98% 98%  Weight: 61.8 kg     Height: '5\' 5"'$  (1.651 m)       Intake/Output Summary (Last 24 hours) at 10/15/2022 0810 Last data filed at 10/15/2022 0300 Gross per 24 hour  Intake 627.13 ml  Output 200 ml  Net 427.13 ml   Filed Weights   10/14/22 1432  Weight: 61.8 kg    Examination:  Oriented coherent, cachectic S1-S2 no murmur Reflexes are brisk lower extremity he is able to sense feeling in his lower extremities CTAB no added sound ROM intact  Data Reviewed: personally reviewed   CBC    Component Value Date/Time   WBC 7.5 10/15/2022 0425   RBC 3.64 (L) 10/15/2022 0425   HGB 12.0 (L) 10/15/2022 0425   HGB 12.9 (L) 10/16/2021 1340   HGB 16.5 04/28/2018 1054   HCT 36.8 (L) 10/15/2022 0425   HCT 48.3 04/28/2018 1054   PLT 127 (L) 10/15/2022 0425   PLT 220 10/16/2021 1340   PLT 224 04/28/2018 1054   MCV 101.1 (H) 10/15/2022 0425   MCV 92  04/28/2018 1054   MCH 33.0 10/15/2022 0425   MCHC 32.6 10/15/2022 0425   RDW 15.4 10/15/2022 0425   RDW 14.8 04/28/2018 1054   LYMPHSABS 0.9 10/12/2022 1103   MONOABS 0.8 10/12/2022 1103   EOSABS 0.2 10/12/2022 1103   BASOSABS 0.0 10/12/2022 1103      Latest Ref Rng & Units 10/14/2022    4:10 AM 10/12/2022   11:03 AM 10/16/2021    1:40 PM  CMP  Glucose 70 - 99 mg/dL 157  118  101   BUN 8 - 23 mg/dL '25  13  13   '$ Creatinine 0.61 - 1.24 mg/dL 0.88  0.75  0.75   Sodium 135 - 145 mmol/L 136  139  139   Potassium 3.5 - 5.1 mmol/L 4.1  4.1  4.4   Chloride 98 - 111 mmol/L 103  102  105   CO2 22 - 32  mmol/L '25  27  27   '$ Calcium 8.9 - 10.3 mg/dL 9.0  9.3  9.5   Total Protein 6.5 - 8.1 g/dL 6.2   7.2   Total Bilirubin 0.3 - 1.2 mg/dL 0.2   0.3   Alkaline Phos 38 - 126 U/L 238   249   AST 15 - 41 U/L 39   36   ALT 0 - 44 U/L 36   48      Radiology Studies: No results found.   Scheduled Meds:  apixaban  5 mg Oral BID   dexamethasone  4 mg Oral Q6H   senna  1 tablet Oral BID   Continuous Infusions:   LOS: 2 days   Time spent: 9  Nita Sells, MD [facility] To contact the attending provider between 7A-7P or the covering provider during after hours 7P-7A, please log into the web site [email or web address] and access using universal [place] password for that web site. If you do not have the password, please call the hospital operator.  10/15/2022, 8:10 AM

## 2022-10-15 NOTE — Evaluation (Signed)
Physical Therapy Evaluation Patient Details Name: Jason Lyons MRN: [id number] DOB: [birth year] Today's Date: 10/15/2022  History of Present Illness  "67 year old male, speaks [place], with a history of PE, on apixaban, recent metastatic cholangiocarcinoma to the spine and liver, status post SBRT to T4 lesion (August 2022), with complaints of progressive worsening chest pain and bilateral lower extremity weakness.  He states his weakness in the lower extremities has been significant over the past 3 to 4 weeks causing him unable to move his legs or walk.  He denies any abdominal pain, nausea or vomiting.  He denies any vision changes or headaches.  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  (progressive metastatic disease at T4 and T6 with compressive lesions, nonsurgical, has started radiation treatment)  Clinical Impression  Pt admitted with above diagnosis.  Pt currently with functional limitations due to the deficits listed below (see PT Problem List). Pt will benefit from skilled PT to increase their independence and safety with mobility to allow discharge to the venue listed below.  Pt agreeable to attempt mobility however not able to tolerate sidelying to sit bed mobility due to pain.  Son present and assisted with translating.  Son reports pt quickly declined in mobility and he was having to assist pt with transfers.  Son reports being hesitant to assist most of the time due to pt's increased pain with mobility.  Pt would benefit from post acute rehab to assist with decreasing burden of care and education on pt positioning and transfers.  Pt may benefit from palliative care consult for goals of care and pain management as son reports pain is big concern at this time (RN notified).  Pt would also benefit from [facility] for foot positioning and floating heels (RN notified).         Recommendations for follow up therapy are one component of a multi-disciplinary discharge planning process, led by the attending physician.  Recommendations may be updated based on patient status, additional functional criteria and insurance authorization.  Follow Up Recommendations Acute inpatient rehab (3hours/day)      Assistance Recommended at Discharge Frequent or constant Supervision/Assistance  Patient can return home with the following  A lot of help with walking and/or transfers;A lot of help with bathing/dressing/bathroom    Equipment Recommendations Hospital bed  Recommendations for Other Services       Functional Status Assessment Patient has had a recent decline in their functional status and demonstrates the ability to make significant improvements in function in a reasonable and predictable amount of time.     Precautions / Restrictions Precautions Precautions: Fall;Back Precaution Comments: Thoracic lesions, paraplegia      Mobility  Bed Mobility Overal bed mobility: Needs Assistance Bed Mobility: Rolling, Sidelying to Sit Rolling: Mod assist         General bed mobility comments: assist for lower body, cues for utilizing rails to self assist upper body; pt reports too much pain once sidelying to complete sidelying to sit, pt able to assist with scooting up Crouch with upper body, repositioned pt with pillows under LEs and heels floating    Transfers                        Ambulation/Gait                  Stairs  Wheelchair Mobility    Modified Rankin (Stroke Patients Only)       Balance Overall balance assessment: Needs assistance     Sitting balance - Comments: unable to assess due to pain however pt reports not being able to control trunk once sitting                                     Pertinent Vitals/Pain Pain Assessment Pain Assessment: 0-10 Pain Score: 7  Pain Location: across T6-7  area Pain Descriptors / Indicators: Grimacing, Sharp Pain Intervention(s): Repositioned, Monitored during session, Patient requesting pain meds-RN notified    Home Living Family/patient expects to be discharged to:: Private residence Living Arrangements: Spouse/significant other;Children Available Help at Discharge: Family Type of Home: House Home Access: Level entry       Home Layout: One level Home Equipment: Wheelchair - manual      Prior Function Prior Level of Function : Needs assist             Mobility Comments: pt's son present and translated, pt's other son provided a w/c for pt just prior to admission, pt's sons were attempting to assist him with transfers (when he became unable to walk)(son states LE weakness was sudden and pain increased with attempts to assist pt mobilize)       Hand Dominance        Extremity/Trunk Assessment        Lower Extremity Assessment Lower Extremity Assessment: RLE deficits/detail;LLE deficits/detail RLE Deficits / Details: flaccid bil LEs, reports sensation intact       Communication   Communication: Prefers language other than English  Cognition Arousal/Alertness: Awake/alert Behavior During Therapy: Flat affect Overall Cognitive Status: Difficult to assess                                          General Comments      Exercises     Assessment/Plan    PT Assessment Patient needs continued PT services  PT Problem List Decreased strength;Decreased activity tolerance;Decreased balance;Decreased knowledge of use of DME;Decreased mobility;Pain;Decreased coordination       PT Treatment Interventions Balance training;Functional mobility training;Therapeutic activities;Patient/family education;Wheelchair mobility training;Therapeutic exercise;DME instruction;Neuromuscular re-education    PT Goals (Current goals can be found in the Care Plan section)  Acute Rehab PT Goals PT Goal Formulation: With  patient/family Time For Goal Achievement: 10/29/22 Potential to Achieve Goals: Fair    Frequency Min 3X/week     Co-evaluation               AM-PAC PT "6 Clicks" Mobility  Outcome Measure Help needed turning from your back to your side while in a flat bed without using bedrails?: A Lot Help needed moving from lying on your back to sitting on the side of a flat bed without using bedrails?: Total Help needed moving to and from a bed to a chair (including a wheelchair)?: Total Help needed standing up from a chair using your arms (e.g., wheelchair or bedside chair)?: Total Help needed to walk in hospital room?: Total Help needed climbing 3-5 steps with a railing? : Total 6 Click Score: 7    End of Session   Activity Tolerance: Patient limited by pain Patient left: in bed;with call bell/phone within reach;with family/visitor present Nurse Communication: Mobility status PT  Visit Diagnosis: Other abnormalities of gait and mobility (R26.89);Other symptoms and signs involving the nervous system (R29.898)    Time: 2863-8177 PT Time Calculation (min) (ACUTE ONLY): 21 min   Charges:   PT Evaluation $PT Eval Low Complexity: 1 Low        Kati PT, DPT Physical Therapist Acute Rehabilitation Services Preferred contact method: Secure Chat Weekend Pager Only: [phone] Office: [place] 10/15/2022, 2:47 PM

## 2022-10-15 NOTE — Progress Notes (Signed)
Upon primary RN assessment, interpreter utilized. the patient complained of left chest pain that radiates into his left back.The pain is intermittent and has been experienced over the last 3 weeks.  He denies SOB, palpitations, dizziness,headache, indigestion, or N/V. Primary RN contacted hospitalist provider to assist. EKG complete.    10/15/22 1630  Vitals  Temp 98.7 F (37.1 C)  Temp Source Oral  BP (!) 149/89  MAP (mmHg) 107  BP Location Right Arm  BP Method Automatic  Patient Position (if appropriate) Lying  Pulse Rate (!) 107  Pulse Rate Source Dinamap  MEWS COLOR  MEWS Score Color Green  Pain Assessment  Pain Scale 0-10  Pain Score 7  Pain Type Acute pain  Pain Location Chest  Pain Orientation Left;Mid  Pain Descriptors / Indicators Sharp  Pain Onset On-going  Patients Stated Pain Goal 2  Pain Intervention(s) MD notified (Comment)  Multiple Pain Sites Yes  2nd Pain Site  Pain Score 6  Pain Type Acute pain  Pain Location Back  Pain Orientation Left;Mid  Pain Descriptors / Indicators Sharp  Pain Frequency Intermittent  Patient's Stated Pain Goal 2  Pain Intervention(s) MD notified (Comment)  MEWS Score  MEWS Temp 0  MEWS Systolic 0  MEWS Pulse 1  MEWS RR 0  MEWS LOC 0  MEWS Score 1

## 2022-10-16 DIAGNOSIS — G8220 Paraplegia, unspecified: Secondary | ICD-10-CM | POA: Diagnosis not present

## 2022-10-16 DIAGNOSIS — E440 Moderate protein-calorie malnutrition: Secondary | ICD-10-CM | POA: Insufficient documentation

## 2022-10-16 LAB — BASIC METABOLIC PANEL
Anion gap: 6 (ref 5–15)
BUN: 32 mg/dL — ABNORMAL HIGH (ref 8–23)
CO2: 28 mmol/L (ref 22–32)
Calcium: 8.9 mg/dL (ref 8.9–10.3)
Chloride: 107 mmol/L (ref 98–111)
Creatinine, Ser: 0.81 mg/dL (ref 0.61–1.24)
GFR, Estimated: >60 mL/min (ref >60)
Glucose, Bld: 134 mg/dL — ABNORMAL HIGH (ref 70–99)
Potassium: 4.7 mmol/L (ref 3.5–5.1)
Sodium: 141 mmol/L (ref 135–145)

## 2022-10-16 MED ORDER — PANTOPRAZOLE SODIUM 40 MG PO TBEC
40.0000 mg | DELAYED_RELEASE_TABLET | Freq: Every day | ORAL | Status: DC
  Administered 2022-10-16 – 2022-10-29 (×14): 40 mg via ORAL
  Filled 2022-10-16 (×14): qty 1

## 2022-10-16 MED ORDER — ORAL CARE MOUTH RINSE: 15.0000 mL | OROMUCOSAL | Status: DC | PRN

## 2022-10-16 NOTE — TOC Initial Note (Signed)
Transition of Care [facility]) - Initial/Assessment Note    Patient Details  Name: Woodfin Kiss MRN: [id number] Date of Birth: 1956/09/03  [facility]) CM/SW Contact:    Rodney Booze, LCSW Phone Number: 10/16/2022, 4:57 PM  Clinical Narrative:                 The son is aware of the SNF process, CSW has sent the patient. The son also reports that the patient does have medicaid and will add that to the patient's chart. The son also wants help looking into LTC for his dad.   Expected Discharge Plan: Skilled Nursing Facility Barriers to Discharge: No Barriers Identified   Patient Goals and CMS Choice Patient states their goals for this hospitalization and ongoing recovery are:: Patient son would like the son to go into LTC eventually, patient son reports he has medicad.   Choice offered to / list presented to : Patient      Expected Discharge Plan and Services In-house Referral: Clinical Social Work     Living arrangements for the past 2 months: Single Family Home                                      Prior Living Arrangements/Services Living arrangements for the past 2 months: Single Family Home Lives with:: Adult Children Patient language and need for interpreter reviewed:: Yes Do you feel safe going back to the place where you live?: Yes      Need for Family Participation in Patient Care: Yes (Comment) Care giver support system in place?: Yes (comment)   Criminal Activity/Legal Involvement Pertinent to Current Situation/Hospitalization: No - Comment as needed  Activities of Daily Living Home Assistive Devices/Equipment: Cane (specify quad or straight) ADL Screening (condition at time of admission) Patient's cognitive ability adequate to safely complete daily activities?: Yes Is the patient deaf or have difficulty hearing?: No Does the patient have difficulty seeing, even when wearing glasses/contacts?: No Does the patient have difficulty concentrating,  remembering, or making decisions?: No Patient able to express need for assistance with ADLs?: Yes Does the patient have difficulty dressing or bathing?: Yes Independently performs ADLs?: No Communication: Needs assistance (interpreter) Is this a change from baseline?: Pre-admission baseline Dressing (OT): Needs assistance Is this a change from baseline?: Change from baseline, expected to last >3 days Grooming: Needs assistance Is this a change from baseline?: Change from baseline, expected to last >3 days Feeding: Independent Bathing: Needs assistance Is this a change from baseline?: Change from baseline, expected to last >3 days Toileting: Needs assistance Is this a change from baseline?: Change from baseline, expected to last >3days In/Out Bed: Dependent Is this a change from baseline?: Change from baseline, expected to last >3 days Walks in Home: Dependent Is this a change from baseline?: Change from baseline, expected to last >3 days Does the patient have difficulty walking or climbing stairs?: Yes Weakness of Legs: Both Weakness of Arms/Hands: None  Permission Sought/Granted Permission sought to share information with : Family Supports Permission granted to share information with : No (chart)  Share Information with NAME: Cira Rue           Emotional Assessment Appearance:: Well-Groomed, Appears stated age     Orientation: : Oriented to Self, Oriented to  Time, Oriented to Place, Oriented to Situation, Fluctuating Orientation (Suspected and/or reported Sundowners)   Psych Involvement: No (comment)  Admission  diagnosis:  Paraplegia ([place]) [G82.20] Spinal cord compression ([facility]) [G95.20] Pulmonary nodules [R91.8] Metastasis to spinal column ([facility]) [C79.51] Macrocytosis without anemia [D75.89] Lower extremity weakness [R29.898] Elevated random blood glucose level [R73.09] Nonspecific chest pain [R07.9] Weakness of lower extremity, unspecified laterality  [R29.898] Patient Active Problem List   Diagnosis Date Noted   Malnutrition of moderate degree 10/16/2022   Lower extremity weakness 10/13/2022   Paraplegia ([place]) 10/13/2022   Pancytopenia, acquired ([place]) 07/31/2021   Financial difficulties 06/02/2021   Goals of care, counseling/discussion 05/21/2021   Cancer of biliary tract ([place]) 05/20/2021   Metastatic cancer to spine [facility])    Other constipation    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

## 2022-10-16 NOTE — [place] FL2 (Signed)
[name] MEDICAID FL2 LEVEL OF CARE FORM     IDENTIFICATION  Patient Name: Jason Lyons Birthdate: 11-20-1955 Sex: male Admission Date (Current Location): 10/12/2022  [facility] [place] and [place] Number:  Herbalist and Address:  [facility],  [place] [address], [place]      Provider Number: [id number]  Attending Physician Name and Address:  Nita Sells, MD  Relative Name and Phone Number:       Current Level of Care: Hospital Recommended Level of Care: [place] Prior Approval Number:    Date Approved/Denied: 10/16/22 PASRR Number: [id number]  Discharge Plan: SNF    Current Diagnoses: Patient Active Problem List   Diagnosis Date Noted   Malnutrition of moderate degree 10/16/2022   Lower extremity weakness 10/13/2022   Paraplegia ([place]) 10/13/2022   Pancytopenia, acquired ([place]) 07/31/2021   Financial difficulties 06/02/2021   Goals of care, counseling/discussion 05/21/2021   Cancer of biliary tract ([place]) 05/20/2021   Metastatic cancer to spine [facility])    Other constipation    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    Pulmonary embolism ([place]) 05/13/2021   Liver mass, right lobe 05/13/2021   Elevated LFTs 05/13/2021    Orientation RESPIRATION BLADDER Height & Weight     Self, Time, Situation, Place    Continent Weight: 136 lb 3.2 oz (61.8 kg) Height:  '5\' 5"'$  (165.1 cm)  BEHAVIORAL SYMPTOMS/MOOD NEUROLOGICAL BOWEL NUTRITION STATUS      Continent    AMBULATORY STATUS COMMUNICATION OF NEEDS Skin   Supervision Verbally Normal                       Personal Care Assistance Level of Assistance  Bathing, Feeding, Dressing Bathing Assistance: Limited assistance Feeding assistance: Limited assistance Dressing Assistance: Limited assistance     Functional Limitations Info  Sight, Hearing, Speech Sight Info: Adequate Hearing Info: Adequate Speech Info: Adequate    SPECIAL CARE FACTORS FREQUENCY                        Contractures Contractures Info: Not present    Additional Factors Info  Code Status, Allergies Code Status Info: Full Allergies Info: N/A           Current Medications (10/16/2022):  This is the current hospital active medication list Current Facility-Administered Medications  Medication Dose Route Frequency Provider Last Rate Last Admin   acetaminophen (TYLENOL) tablet 650 mg  650 mg Oral Q6H PRN Crosley, Debby, MD       Or   acetaminophen (TYLENOL) suppository 650 mg  650 mg Rectal Q6H PRN Crosley, Debby, MD       apixaban (ELIQUIS) tablet 5 mg  5 mg Oral BID Darliss Cheney, MD   5 mg at 10/16/22 0920   dexamethasone (DECADRON) tablet 4 mg  4 mg Oral Q6H Pahwani, Ravi, MD   4 mg at 10/16/22 1108   feeding supplement (ENSURE ENLIVE / ENSURE PLUS) liquid 237 mL  237 mL Oral BID BM Samtani, Jai-Gurmukh, MD   237 mL at 10/16/22 1336   HYDROmorphone (DILAUDID) injection 1 mg  1 mg Intravenous Q4H PRN Crosley, Debby, MD   1 mg at 10/16/22 1336   ondansetron (ZOFRAN) tablet 4 mg  4 mg Oral Q6H PRN Crosley, Debby, MD       Or   ondansetron (ZOFRAN) injection 4 mg  4 mg Intravenous Q6H PRN Crosley, Debby, MD   4 mg  at 10/15/22 1401   Oral care mouth rinse  15 mL Mouth Rinse PRN Nita Sells, MD       oxyCODONE (Oxy IR/ROXICODONE) immediate release tablet 5 mg  5 mg Oral V9T PRN Delora Fuel, MD   5 mg at 10/15/22 1028   pantoprazole (PROTONIX) EC tablet 40 mg  40 mg Oral Daily Nita Sells, MD       polyethylene glycol (MIRALAX / GLYCOLAX) packet 17 g  17 g Oral Daily Nita Sells, MD   17 g at 10/16/22 0920   senna (SENOKOT) tablet 8.6 mg  1 tablet Oral BID [name], MD   8.6 mg at 10/16/22 0920     Discharge Medications: Please see discharge summary for a list of discharge medications.  Relevant Imaging Results:  Relevant Lab Results:   Additional Information [place] Jason Staples LCSW-A [id number]  Rodney Booze,  LCSW

## 2022-10-16 NOTE — Progress Notes (Signed)
PROGRESS NOTE   Jason Lyons  LDJ:[id number] DOB: 13-Feb-1956 DOA: 10/12/2022 PCP: Pcp, No  Brief Narrative:  67 year old [place] male with known adeno cancer of the biliary tract diagnosed 05/13/2021-followed by oncologist Dr. Rozetta Nunnery on cisplatin gemcitabine currently managed at [facility] on chemo there Known prior pulmonary embolism Pathological fracture T4 s/p XRt in the past as well  Seen at [facility] ED 10/07/2022 and because of pain in the back as well as numbness bilateral hands and feet was given oxycodone and discharge He has been unable to pass stool for 3 weeks he has control of his urine  Patient brought to [facility] ED 10/12/2022 with right-sided chest pain radiating to back-found to have T6 spinal mets and scattered new pulmonary nodules  Neurosurgery was consulted and did not feel that he was an operative candidate and he was given Decadron and patient was transferred over to [facility] for consideration of XRT which would be palliative  Hospital-Problem based course  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 - Start MiraLAX and senna scheduled - give tap water enema  Anterior chest/scapular pain - had CP relieved by GI cocktail on 1.5, Start protonix  Previous pulmonary embolism -Continue Eliquis 5 twice daily  DVT prophylaxis: Eliquis Code Status: Full Family Communication: None Disposition:  Status is: Inpatient Remains  inpatient appropriate because:   Needs radiation therapy and therapy eval    Subjective:  Regaining sensation in LE's some Can sense now when needs to use RR for urine or stool Still no stool Dilaudid helps pain No stool with laxatives   Objective: Vitals:   10/15/22 1632 10/15/22 2212 10/16/22 0700 10/16/22 1222  BP: (!) 143/80 135/77 133/78 (!) 155/83  Pulse: 63 65 62 (!) 57  Resp:  '16 16 15  '$ Temp:  98.5 F (36.9 C) 98.3 F (36.8 C) 98 F (36.7 C)  TempSrc:  Oral Oral Oral  SpO2:  97% 99% 97%  Weight:      Height:        Intake/Output Summary (Last 24 hours) at 10/16/2022 1500 Last data filed at 10/16/2022 1400 Gross per 24 hour  Intake 600 ml  Output 500 ml  Net 100 ml    Filed Weights   10/14/22 1432  Weight: 61.8 kg    Examination:  Oriented coherent, cachectic S1-S2 no murmur Reflexes brisk lower extremity he is able to sense feeling in his lower extremities, has increased heaviness on the L>R CTAB no added sound ROM intact   Data Reviewed: personally reviewed   CBC    Component Value Date/Time   WBC 7.5 10/15/2022 0425   RBC 3.64 (L) 10/15/2022 0425   HGB 12.0 (L) 10/15/2022 0425   HGB 12.9 (L) 10/16/2021 1340   HGB 16.5 04/28/2018 1054   HCT 36.8 (L) 10/15/2022 0425   HCT 48.3 04/28/2018 1054   PLT 127 (L) 10/15/2022 0425   PLT 220 10/16/2021 1340   PLT 224 04/28/2018  1054   MCV 101.1 (H) 10/15/2022 0425   MCV 92 04/28/2018 1054   MCH 33.0 10/15/2022 0425   MCHC 32.6 10/15/2022 0425   RDW 15.4 10/15/2022 0425   RDW 14.8 04/28/2018 1054   LYMPHSABS 0.9 10/12/2022 1103   MONOABS 0.8 10/12/2022 1103   EOSABS 0.2 10/12/2022 1103   BASOSABS 0.0 10/12/2022 1103      Latest Ref Rng & Units 10/16/2022    4:51 AM 10/14/2022    4:10 AM 10/12/2022   11:03 AM  CMP  Glucose 70 - 99 mg/dL 134  157  118   BUN 8 - 23 mg/dL 32  25  13   Creatinine 0.61 - 1.24 mg/dL 0.81  0.88  0.75   Sodium 135 - 145 mmol/L 141  136  139   Potassium 3.5 - 5.1  mmol/L 4.7  4.1  4.1   Chloride 98 - 111 mmol/L 107  103  102   CO2 22 - 32 mmol/L '28  25  27   '$ Calcium 8.9 - 10.3 mg/dL 8.9  9.0  9.3   Total Protein 6.5 - 8.1 g/dL  6.2    Total Bilirubin 0.3 - 1.2 mg/dL  0.2    Alkaline Phos 38 - 126 U/L  238    AST 15 - 41 U/L  39    ALT 0 - 44 U/L  36       Radiology Studies: No results found.   Scheduled Meds:  apixaban  5 mg Oral BID   dexamethasone  4 mg Oral Q6H   feeding supplement  237 mL Oral BID BM   polyethylene glycol  17 g Oral Daily   senna  1 tablet Oral BID   Continuous Infusions:   LOS: 3 days   Time spent: 64  Nita Sells, MD [facility] To contact the attending provider between 7A-7P or the covering provider during after hours 7P-7A, please log into the web site [email or web address] and access using universal [place] password for that web site. If you do not have the password, please call the hospital operator.  10/16/2022, 3:00 PM

## 2022-10-17 DIAGNOSIS — G8220 Paraplegia, unspecified: Secondary | ICD-10-CM | POA: Diagnosis not present

## 2022-10-17 NOTE — Progress Notes (Addendum)
This RN attempted to manually disimpact, per MD order. Was able to get equivalent of small bowel movement before stopping to prevent further pain. Will monitor for further BM.

## 2022-10-17 NOTE — Progress Notes (Signed)
PROGRESS NOTE   Jason Lyons  AQT:[id number] DOB: 24-May-1956 DOA: 10/12/2022 PCP: Pcp, No  Brief Narrative:  67 year old [place] male with known adeno cancer of the biliary tract diagnosed 05/13/2021-followed by oncologist Dr. Rozetta Nunnery on cisplatin gemcitabine currently managed at [facility] on chemo there Known prior pulmonary embolism Pathological fracture T4 s/p XRt in the past as well  Seen at [facility] ED 10/07/2022 and because of pain in the back as well as numbness bilateral hands and feet was given oxycodone and discharge He has been unable to pass stool for 3 weeks he has control of his urine  Patient brought to [facility] ED 10/12/2022 with right-sided chest pain radiating to back-found to have T6 spinal mets and scattered new pulmonary nodules  Neurosurgery was consulted and did not feel that he was an operative candidate and he was given Decadron and patient was transferred over to [facility] for consideration of XRT which would be palliative  Hospital-Problem based course  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 - Start MiraLAX and senna scheduled - Tapwater enema and effective patient will need disimpaction  Anterior chest/scapular pain - had CP relieved by GI cocktail on 1.5, Start protonix  Previous pulmonary embolism -Continue Eliquis 5 twice daily  DVT prophylaxis:  Eliquis Code Status: Full Family Communication: None Disposition:  Status is: Inpatient Remains inpatient appropriate because:   Needs radiation therapy and therapy eval    Subjective: Interviewed with interpreter  Cannot do straight leg raise no stool despite enema Eating and drinking okay Tells me he does not have adequate supervision at home No other specific complaints  Objective: Vitals:   10/16/22 0700 10/16/22 1222 10/16/22 2000 10/17/22 0423  BP: 133/78 (!) 155/83 (!) 146/90 (!) 143/87  Pulse: 62 (!) 57 66 65  Resp: '16 15 17 18  '$ Temp: 98.3 F (36.8 C) 98 F (36.7 C) 97.7 F (36.5 C) 98.2 F (36.8 C)  TempSrc: Oral Oral Oral Oral  SpO2: 99% 97% 98% 97%  Weight:      Height:        Intake/Output Summary (Last 24 hours) at 10/17/2022 0914 Last data filed at 10/17/2022 0300 Gross per 24 hour  Intake 720 ml  Output 1025 ml  Net -305 ml    Filed Weights   10/14/22 1432  Weight: 61.8 kg    Examination:  Oriented coherent, cachectic S1-S2 no murmur Reflexes brisk lower extremity he is able to sense feeling in his lower extremities, has increased heaviness on the L>R He is unable to straight leg raise cannot dorsi or plantarflex Abdomen is distended with large masslike area in the lower quadrant likely secondary to stool CTAB no added sound ROM intact   Data Reviewed: personally reviewed   CBC    Component Value Date/Time   WBC 7.5 10/15/2022 0425   RBC 3.64 (L) 10/15/2022 0425   HGB 12.0 (  L) 10/15/2022 0425   HGB 12.9 (L) 10/16/2021 1340   HGB 16.5 04/28/2018 1054   HCT 36.8 (L) 10/15/2022 0425   HCT 48.3 04/28/2018 1054   PLT 127 (L) 10/15/2022 0425   PLT 220 10/16/2021 1340   PLT 224 04/28/2018 1054   MCV 101.1 (H) 10/15/2022 0425   MCV 92 04/28/2018 1054   MCH 33.0 10/15/2022 0425   MCHC 32.6 10/15/2022 0425   RDW 15.4 10/15/2022 0425   RDW 14.8 04/28/2018 1054   LYMPHSABS 0.9 10/12/2022 1103   MONOABS 0.8 10/12/2022 1103   EOSABS 0.2  10/12/2022 1103   BASOSABS 0.0 10/12/2022 1103      Latest Ref Rng & Units 10/16/2022    4:51 AM 10/14/2022    4:10 AM 10/12/2022   11:03 AM  CMP  Glucose 70 - 99 mg/dL 134  157  118   BUN 8 - 23 mg/dL 32  25  13   Creatinine 0.61 - 1.24 mg/dL 0.81  0.88  0.75   Sodium 135 - 145 mmol/L 141  136  139   Potassium 3.5 - 5.1 mmol/L 4.7  4.1  4.1   Chloride 98 - 111 mmol/L 107  103  102   CO2 22 - 32 mmol/L '28  25  27   '$ Calcium 8.9 - 10.3 mg/dL 8.9  9.0  9.3   Total Protein 6.5 - 8.1 g/dL  6.2    Total Bilirubin 0.3 - 1.2 mg/dL  0.2    Alkaline Phos 38 - 126 U/L  238    AST 15 - 41 U/L  39    ALT 0 - 44 U/L  36       Radiology Studies: No results found.   Scheduled Meds:  apixaban  5 mg Oral BID   dexamethasone  4 mg Oral Q6H   feeding supplement  237 mL Oral BID BM   pantoprazole  40 mg Oral Daily   polyethylene glycol  17 g Oral Daily   senna  1 tablet Oral BID   Continuous Infusions:   LOS: 4 days   Time spent: 51  Nita Sells, MD [facility] To contact the attending provider between 7A-7P or the covering provider during after hours 7P-7A, please log into the web site [email or web address] and access using universal [place] password for that web site. If you do not have the password, please call the hospital operator.  10/17/2022, 9:14 AM

## 2022-10-18 ENCOUNTER — Other Ambulatory Visit: Payer: Self-pay

## 2022-10-18 ENCOUNTER — Ambulatory Visit
Admit: 2022-10-18 | Discharge: 2022-10-18 | Disposition: A | Discharge status: Home / Self Care | Payer: Medicare Other | Attending: Radiation Oncology | Admitting: Radiation Oncology

## 2022-10-18 ENCOUNTER — Ambulatory Visit: Payer: Medicare Other

## 2022-10-18 DIAGNOSIS — G8220 Paraplegia, unspecified: Secondary | ICD-10-CM | POA: Diagnosis not present

## 2022-10-18 LAB — RAD ONC ARIA SESSION SUMMARY
Course Elapsed Days: 4
Plan Fractions Treated to Date: 3
Plan Fractions Treated to Date: 3
Plan Prescribed Dose Per Fraction: 3 Gy
Plan Prescribed Dose Per Fraction: 3 Gy
Plan Total Fractions Prescribed: 10
Plan Total Fractions Prescribed: 6
Plan Total Prescribed Dose: 18 Gy
Plan Total Prescribed Dose: 30 Gy
Reference Point Dosage Given to Date: 9 Gy
Reference Point Dosage Given to Date: 9 Gy
Reference Point Session Dosage Given: 3 Gy
Reference Point Session Dosage Given: 3 Gy
Session Number: 3

## 2022-10-18 LAB — CBC WITH DIFFERENTIAL/PLATELET
Abs Immature Granulocytes: 0.09 K/uL — ABNORMAL HIGH (ref 0.00–0.07)
Basophils Absolute: 0 K/uL (ref 0.0–0.1)
Basophils Relative: 0 %
Eosinophils Absolute: 0 K/uL (ref 0.0–0.5)
Eosinophils Relative: 0 %
HCT: 40.4 % (ref 39.0–52.0)
Hemoglobin: 13.4 g/dL (ref 13.0–17.0)
Immature Granulocytes: 1 %
Lymphocytes Relative: 6 %
Lymphs Abs: 0.4 K/uL — ABNORMAL LOW (ref 0.7–4.0)
MCH: 32.6 pg (ref 26.0–34.0)
MCHC: 33.2 g/dL (ref 30.0–36.0)
MCV: 98.3 fL (ref 80.0–100.0)
Monocytes Absolute: 0.8 K/uL (ref 0.1–1.0)
Monocytes Relative: 12 %
Neutro Abs: 5.5 K/uL (ref 1.7–7.7)
Neutrophils Relative %: 81 %
Platelets: 152 K/uL (ref 150–400)
RBC: 4.11 MIL/uL — ABNORMAL LOW (ref 4.22–5.81)
RDW: 15.3 % (ref 11.5–15.5)
WBC: 6.8 K/uL (ref 4.0–10.5)
nRBC: 0.4 % — ABNORMAL HIGH (ref 0.0–0.2)

## 2022-10-18 LAB — BASIC METABOLIC PANEL
Anion gap: 7 (ref 5–15)
BUN: 57 mg/dL — ABNORMAL HIGH (ref 8–23)
CO2: 26 mmol/L (ref 22–32)
Calcium: 8.6 mg/dL — ABNORMAL LOW (ref 8.9–10.3)
Chloride: 104 mmol/L (ref 98–111)
Creatinine, Ser: 1.25 mg/dL — ABNORMAL HIGH (ref 0.61–1.24)
GFR, Estimated: >60 mL/min (ref >60)
Glucose, Bld: 146 mg/dL — ABNORMAL HIGH (ref 70–99)
Potassium: 4.9 mmol/L (ref 3.5–5.1)
Sodium: 137 mmol/L (ref 135–145)

## 2022-10-18 MED ORDER — SODIUM CHLORIDE 0.9 % IV SOLN: INTRAVENOUS | Status: DC

## 2022-10-18 NOTE — TOC Progression Note (Addendum)
Transition of Care [facility]) - Progression Note    Patient Details  Name: Jason Lyons MRN: [id number] Date of Birth: 28-Oct-1955  Transition of Care [facility]) CM/SW [place], LCSW Phone Number: 10/18/2022, 10:59 AM  Clinical Narrative:    CSW attempted to contact pt's son Maximilian Tallo regarding SNF beds. No response left HIPAA complaint VM. TOC to follow.  2:30pm CSW spoke with pt who reported he will call his son to speak with this writer about SNF beds TOC to follow.   3:40pm CSW presented bed offers to [name] , he is requesting time to review. Pt's son inquired about in home care. CSW informed pt's son about Private Duty Care. Pt's son reported pt has Medicaid in place, CSW suggested pt's son speak with pt's medicaid worker to see if pt qualify for the CAP program that provides in home aide services. Pt's son reported he will work on this as pt is in rehab. TOC to follow.    Expected Discharge Plan: [place] Barriers to Discharge: No Barriers Identified  Expected Discharge Plan and Services In-house Referral: Clinical Social Work     Living arrangements for the past 2 months: Single Family Home                                       Social Determinants of Health (SDOH) Interventions SDOH Screenings   Food Insecurity: No Food Insecurity (10/14/2022)  Housing: Low Risk  (10/14/2022)  Transportation Needs: No Transportation Needs (10/14/2022)  Utilities: Not At Risk (10/14/2022)  Tobacco Use: Medium Risk (10/14/2022)    Readmission Risk Interventions     No data to display

## 2022-10-18 NOTE — Progress Notes (Addendum)
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  He states his weakness in the lower extremities has been significant over the past 3 to 4 weeks causing him unable to move his legs or walk.  He denies any abdominal pain, nausea or vomiting.  He denies any vision changes or headaches.  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  (progressive metastatic disease at T4 and T6 with compressive lesions, nonsurgical, has started radiation treatment)    PT Comments    Pt assisted with log roll technique to come to sitting EOB.  Pt reports constant 7/10 pain spanning around chest and back T6-7 area.  RN into room and gave IV pain meds.  Utilized ipad interpreter, [employer], for assist translating.  Pt continues to present with flaccid lower body and repositioned in supine with pillows for floating heels.  Uncertain if current pain regimen will assist pt with safely mobilizing upon d/c, so pt may benefit from palliative consult for goals of care and pain management.  Would also recommend PRAFOs to help with foot positioning and offloading heels.  AIR uncertain of ability for pt to tolerate due to pain, so if not AIR, pt will likely need SNF to assist pt and family with education on safe positioning and transfers.   Recommendations for follow up therapy are one component of a multi-disciplinary discharge planning process, led by the attending physician.  Recommendations may be updated based on patient status, additional functional  criteria and insurance authorization.  Follow Up Recommendations  Acute inpatient rehab (3hours/day) (vs SNF if AIR not possible)     Assistance Recommended at Discharge Frequent or constant Supervision/Assistance  Patient can return home with the following A lot of help with walking and/or transfers;A lot of help with bathing/dressing/bathroom   Equipment Recommendations  Hospital bed    Recommendations for Other Services       Precautions / Restrictions Precautions Precautions: Fall;Back Precaution Comments: Thoracic lesions, paraplegia     Mobility  Bed Mobility Overal bed mobility: Needs Assistance Bed Mobility: Rolling, Sidelying to Sit Rolling: Mod assist, +2 for physical assistance Sidelying to sit: Max assist, +2 for physical assistance       General bed mobility comments: assist for lower body, cues for utilizing rails to self assist upper body; requiring increased assist for upper and lower body sidelying <-> sit due to pain and lower body weakness/flaccid    Transfers                        Ambulation/Gait                   Stairs             Wheelchair Mobility    Modified Rankin (Stroke Patients Only)       Balance Overall balance assessment: Needs assistance Sitting-balance support: Bilateral upper extremity supported, Feet supported Sitting balance-Leahy Scale: Poor Sitting balance - Comments: reliant on UE support, constant min/guard-min assist for trunk (hx T4 lesion, new T6 lesion) able to sit EOB for approx 2-3 minutes take medication and  assisted back to bed due to pain                                    Cognition Arousal/Alertness: Awake/alert Behavior During Therapy: Flat affect Overall Cognitive Status: Difficult to assess                                 General Comments: follows commands as able, flat affect, appears possibly depressed?        Exercises      General  Comments        Pertinent Vitals/Pain Pain Assessment Pain Assessment: 0-10 Pain Score: 7  Pain Location: across T6-7 area Pain Descriptors / Indicators: Grimacing, Sharp Pain Intervention(s): Monitored during session, Repositioned, RN gave pain meds during session    Home Living                          Prior Function            PT Goals (current goals can now be found in the care plan section) Progress towards PT goals: Progressing toward goals    Frequency    Min 3X/week      PT Plan Current plan remains appropriate    Co-evaluation              AM-PAC PT "6 Clicks" Mobility   Outcome Measure  Help needed turning from your back to your side while in a flat bed without using bedrails?: A Lot Help needed moving from lying on your back to sitting on the side of a flat bed without using bedrails?: Total Help needed moving to and from a bed to a chair (including a wheelchair)?: Total Help needed standing up from a chair using your arms (e.g., wheelchair or bedside chair)?: Total Help needed to walk in hospital room?: Total Help needed climbing 3-5 steps with a railing? : Total 6 Click Score: 7    End of Session   Activity Tolerance: Patient limited by pain Patient left: in bed;with call bell/phone within reach Nurse Communication: Mobility status PT Visit Diagnosis: Other abnormalities of gait and mobility (R26.89);Other symptoms and signs involving the nervous system (R29.898)     Time: 5573-2202 PT Time Calculation (min) (ACUTE ONLY): 19 min  Charges:  $Therapeutic Activity: 8-22 mins                    Jannette Spanner PT, DPT Physical Therapist Acute Rehabilitation Services Preferred contact method: Secure Chat Weekend Pager Only: [phone] Office: [place] 10/18/2022, 1:37 PM

## 2022-10-18 NOTE — Progress Notes (Signed)
Inpatient Rehab Admissions Coordinator:   Per therapy recommendations, patient was screened for CIR candidacy by Clemens Catholic, MS, CCC-SLP . At this time, Pt. is not at a level to tolerate the intensity of CIR; however,   Pt. may have potential to progress to becoming a potential CIR candidate, so CIR admissions team will follow and monitor for progress and participation with therapies and place consult order if Pt. appears to be an appropriate candidate. Please contact me with any questions.    Clemens Catholic, [place], [place] Admissions Coordinator  [phone] ([place]) [phone] (office)

## 2022-10-18 NOTE — Progress Notes (Signed)
PROGRESS NOTE   Jason Lyons  BDZ:[id number] DOB: 12-Dec-1955 DOA: 10/12/2022 PCP: Pcp, No  Brief Narrative:  67 year old [place] male with known adeno cancer of the biliary tract diagnosed 05/13/2021-followed by oncologist Dr. Rozetta Nunnery on cisplatin gemcitabine currently managed at [facility] on chemo there Known prior pulmonary embolism Pathological fracture T4 s/p XRt in the past as well  Seen at [facility] ED 10/07/2022 and because of pain in the back as well as numbness bilateral hands and feet was given oxycodone and discharge He has been unable to pass stool for 3 weeks he has control of his urine  Patient brought to [facility] ED 10/12/2022 with right-sided chest pain radiating to back-found to have T6 spinal mets and scattered new pulmonary nodules  Neurosurgery was consulted and did not feel that he was an operative candidate and he was given Decadron and patient was transferred over to [facility] for consideration of XRT which would be palliative  Hospital-Problem based course  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 Irven Coe further to add needs follow-up at [facility] - For pain oxycodone 5 every 4 as needed moderate, Dilaudid 1 mg every 4 as needed severe  Metastatic adenocarcinoma biliary tract diagnosed 05/30/2021 - Follows now at [place] follow-up  Severe constipation - Has not had a stool in over 3 weeks? - Start MiraLAX and senna scheduled - Patient was disimpacted with small bowel movement on 1/8  AKI developing during hospital stay - Start IV fluid NS 100 cc/H - Labs in a.m.  Anterior chest/scapular pain - had CP relieved by GI cocktail on 1.5, Start protonix  Previous pulmonary embolism -Continue  Eliquis 5 twice daily  DVT prophylaxis: Eliquis Code Status: Full Family Communication: None Disposition:  Status is: Inpatient Remains inpatient appropriate because:   Needs radiation therapy and therapy eval    Subjective: Interviewed with interpreter  Still no sensation Cannot straight leg raise although flicker of movement on the right side Overall looks well We have started fluids this morning  Objective: Vitals:   10/17/22 0423 10/17/22 1306 10/17/22 2138 10/18/22 0451  BP: (!) 143/87 (!) 141/93 129/76 134/68  Pulse: 65 71 81 84  Resp: '18 18 19 20  '$ Temp: 98.2 F (36.8 C) 98.6 F (37 C) 98.6 F (37 C) 97.9 F (36.6 C)  TempSrc: Oral Oral Oral Oral  SpO2: 97% 98% 96% 98%  Weight:      Height:        Intake/Output Summary (Last 24 hours) at 10/18/2022 0949 Last data filed at 10/18/2022 0300 Gross per 24 hour  Intake 480 ml  Output 1400 ml  Net -920 ml    Filed Weights   10/14/22 1432  Weight: 61.8 kg    Examination:  Oriented coherent, cachectic S1-S2 no murmur Straight leg raise is negative he is unable to do so actively but passively has no pain Abdomen is soft no rebound no guarding Chest is clear no wheeze no rales Affect is pleasant   Data Reviewed: personally reviewed   CBC    Component Value Date/Time   WBC 6.8 10/18/2022 0435   RBC 4.11 (L) 10/18/2022 0435   HGB 13.4 10/18/2022 0435   HGB 12.9 (L) 10/16/2021 1340   HGB 16.5 04/28/2018  1054   HCT 40.4 10/18/2022 0435   HCT 48.3 04/28/2018 1054   PLT 152 10/18/2022 0435   PLT 220 10/16/2021 1340   PLT 224 04/28/2018 1054   MCV 98.3 10/18/2022 0435   MCV 92 04/28/2018 1054   MCH 32.6 10/18/2022 0435   MCHC 33.2 10/18/2022 0435   RDW 15.3 10/18/2022 0435   RDW 14.8 04/28/2018 1054   LYMPHSABS 0.4 (L) 10/18/2022 0435   MONOABS 0.8 10/18/2022 0435   EOSABS 0.0 10/18/2022 0435   BASOSABS 0.0 10/18/2022 0435      Latest Ref Rng & Units 10/18/2022    4:35 AM 10/16/2022    4:51 AM  10/14/2022    4:10 AM  CMP  Glucose 70 - 99 mg/dL 146  134  157   BUN 8 - 23 mg/dL 57  32  25   Creatinine 0.61 - 1.24 mg/dL 1.25  0.81  0.88   Sodium 135 - 145 mmol/L 137  141  136   Potassium 3.5 - 5.1 mmol/L 4.9  4.7  4.1   Chloride 98 - 111 mmol/L 104  107  103   CO2 22 - 32 mmol/L '26  28  25   '$ Calcium 8.9 - 10.3 mg/dL 8.6  8.9  9.0   Total Protein 6.5 - 8.1 g/dL   6.2   Total Bilirubin 0.3 - 1.2 mg/dL   0.2   Alkaline Phos 38 - 126 U/L   238   AST 15 - 41 U/L   39   ALT 0 - 44 U/L   36      Radiology Studies: No results found.   Scheduled Meds:  apixaban  5 mg Oral BID   dexamethasone  4 mg Oral Q6H   feeding supplement  237 mL Oral BID BM   pantoprazole  40 mg Oral Daily   polyethylene glycol  17 g Oral Daily   senna  1 tablet Oral BID   Continuous Infusions:  sodium chloride 100 mL/hr at 10/18/22 0832     LOS: 5 days   Time spent: [place], MD [facility] To contact the attending provider between 7A-7P or the covering provider during after hours 7P-7A, please log into the web site [email or web address] and access using universal [place] password for that web site. If you do not have the password, please call the hospital operator.  10/18/2022, 9:49 AM

## 2022-10-18 NOTE — Care Management Important Message (Signed)
Important Message  Patient Details IM Letter given. Name: Jason Lyons MRN: [id number] Date of Birth: October 17, 1955   Medicare Important Message Given:  Yes     Kerin Salen 10/18/2022, 1:09 PM

## 2022-10-18 NOTE — Progress Notes (Signed)
Orthopedic Tech Progress Note Patient Details:  Diezel Mazur Jun 23, 1956 [id number]  Patient ID: Merritt Mccravy, male   DOB: August 17, 1956, 67 y.o.   MRN: [id number]  Kennis Carina 10/18/2022, 1:49 PM Bi lat AFO ordered from [facility] clinic

## 2022-10-19 ENCOUNTER — Other Ambulatory Visit (HOSPITAL_COMMUNITY): Payer: Self-pay

## 2022-10-19 ENCOUNTER — Ambulatory Visit
Admit: 2022-10-19 | Discharge: 2022-10-19 | Disposition: A | Discharge status: Home / Self Care | Payer: Medicare Other | Attending: Radiation Oncology | Admitting: Radiation Oncology

## 2022-10-19 ENCOUNTER — Ambulatory Visit: Payer: Medicare Other

## 2022-10-19 ENCOUNTER — Other Ambulatory Visit: Payer: Self-pay

## 2022-10-19 DIAGNOSIS — G8220 Paraplegia, unspecified: Secondary | ICD-10-CM | POA: Diagnosis not present

## 2022-10-19 LAB — BASIC METABOLIC PANEL
Anion gap: 7 (ref 5–15)
BUN: 74 mg/dL — ABNORMAL HIGH (ref 8–23)
CO2: 24 mmol/L (ref 22–32)
Calcium: 8.1 mg/dL — ABNORMAL LOW (ref 8.9–10.3)
Chloride: 106 mmol/L (ref 98–111)
Creatinine, Ser: 1.99 mg/dL — ABNORMAL HIGH (ref 0.61–1.24)
GFR, Estimated: 36 mL/min — ABNORMAL LOW (ref >60)
Glucose, Bld: 143 mg/dL — ABNORMAL HIGH (ref 70–99)
Potassium: 5.5 mmol/L — ABNORMAL HIGH (ref 3.5–5.1)
Sodium: 137 mmol/L (ref 135–145)

## 2022-10-19 LAB — RAD ONC ARIA SESSION SUMMARY
Course Elapsed Days: 5
Plan Fractions Treated to Date: 4
Plan Fractions Treated to Date: 4
Plan Prescribed Dose Per Fraction: 3 Gy
Plan Prescribed Dose Per Fraction: 3 Gy
Plan Total Fractions Prescribed: 10
Plan Total Fractions Prescribed: 6
Plan Total Prescribed Dose: 18 Gy
Plan Total Prescribed Dose: 30 Gy
Reference Point Dosage Given to Date: 12 Gy
Reference Point Dosage Given to Date: 12 Gy
Reference Point Session Dosage Given: 3 Gy
Reference Point Session Dosage Given: 3 Gy
Session Number: 4

## 2022-10-19 MED ORDER — DEXAMETHASONE 4 MG PO TABS: 4.0000 mg | ORAL_TABLET | Freq: Four times a day (QID) | ORAL | 0 refills | Status: DC

## 2022-10-19 MED ORDER — PANTOPRAZOLE SODIUM 40 MG PO TBEC: 40.0000 mg | DELAYED_RELEASE_TABLET | Freq: Every day | ORAL | Status: DC

## 2022-10-19 MED ORDER — HYDROMORPHONE HCL 2 MG PO TABS: 2.0000 mg | ORAL_TABLET | Freq: Two times a day (BID) | ORAL | 0 refills | Status: DC | PRN

## 2022-10-19 MED ORDER — SENNA 8.6 MG PO TABS
1.0000 | ORAL_TABLET | Freq: Two times a day (BID) | ORAL | 0 refills | Status: DC
  Filled 2022-10-19: qty 120, 60d supply, fill #0

## 2022-10-19 MED ORDER — PEG 3350-KCL-NA BICARB-NACL 420 G PO SOLR
1000.0000 mL | Freq: Once | ORAL | Status: AC
  Administered 2022-10-19: 1000 mL via ORAL

## 2022-10-19 MED ORDER — POLYETHYLENE GLYCOL 3350 17 G PO PACK
17.0000 g | PACK | Freq: Every day | ORAL | 0 refills | Status: DC
  Filled 2022-10-19: qty 14, 14d supply, fill #0

## 2022-10-19 NOTE — Progress Notes (Addendum)
Patient disimpacted per order. Pt tolerated fair- medium soft, brown BM with bright red streaks returned. MD Samtani made aware. Pt continues to drink Golytely, very slowly. Encouragement provided. Interpreter ID #[id number] used to assessment and all education.

## 2022-10-19 NOTE — Discharge Summary (Incomplete)
Physician Discharge Summary  Jason Lyons PVX:[id number] DOB: Jun 03, 1956 DOA: 10/12/2022  PCP: Pcp, No  Admit date: 10/12/2022 Discharge date: 10/19/2022  Time spent: 27 minutes  Recommendations for Outpatient Follow-up:  Patient needs transport for daily radiation therapy at the [place] to lower spine which should end on 1/17 New medications, Decadron as well as Dilaudid May need intermittent disimpaction given inability to control stool and may need to consider sorbitol as well as Dulcolax suppositories to keep the patient regular Please address goals of care if fails to thrive  Discharge Diagnoses:  MAIN problem for hospitalization   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 Rozetta Nunnery on cisplatin gemcitabine currently managed at [facility] on chemo there Known prior pulmonary embolism Pathological fracture T4 s/p XRt in the past as well   Seen at [facility] ED 10/07/2022 and because of pain in the back as well as numbness bilateral hands and feet was given oxycodone and discharge He has been unable to pass stool for 3 weeks he has control of his urine   Patient brought to [facility] ED 10/12/2022 with right-sided chest pain radiating to back-found to have T6 spinal mets and scattered new pulmonary nodules   Neurosurgery was consulted and did not feel that he was an operative candidate and he was given Decadron and patient was  transferred over to [facility] for consideration of XRT which would be palliative  Hospital Course:  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 Irven Coe further to add needs follow-up at [facility] -  pain was moderately controlled only during hospitalization therefore patient was started on Dilaudid 2 mg every 4 as needed this may need to be escalated   Metastatic adenocarcinoma biliary tract diagnosed 05/30/2021 - Follows now at [place] follow-up   Severe constipation - Has not had a stool in over 3 weeks? - Start MiraLAX and senna scheduled - Patient was disimpacted with small bowel movement on 1/8   AKI developing during hospital stay - Start IV fluid NS 100 cc/H - Labs in a.m.   Anterior chest/scapular pain - had CP relieved by GI cocktail on 1.5, Start protonix   Previous pulmonary embolism -Continue Eliquis 5 twice daily    Procedures: *** (i.e. Studies not automatically included, echos, thoracentesis, etc; not x-rays)  Consultations: ***  Discharge Exam: Vitals:   10/18/22 2007 10/19/22 0353  BP: 138/79 133/78  Pulse: 71 65  Resp: 18 19  Temp: 98.4 F (36.9 C) 98.3 F (36.8 C)  SpO2: 99% 97%    Subj on day of d/c   ***  General Exam on discharge  ***  Discharge Instructions  Discharge Instructions     Diet - low sodium heart healthy   Complete by: As directed    Increase activity slowly   Complete by: As directed       Allergies as of 10/19/2022   No Known Allergies      Medication List     STOP taking these medications    cholecalciferol 25 MCG (1000 UNIT) tablet Commonly known as:  VITAMIN D3   oxyCODONE 5 MG immediate release tablet Commonly known as: Oxy IR/ROXICODONE       TAKE these medications    dexamethasone 4 MG tablet Commonly known as: DECADRON Take 1 tablet (4 mg total) by mouth every 6 (six) hours.   Eliquis 5 MG Tabs tablet Generic drug: apixaban U?ng 1 vin 2 l?n m?i ngy. (Take 1 tablet by mouth 2 times daily.)   HYDROmorphone 2 MG tablet Commonly known as: Dilaudid Take 1 tablet (2 mg total) by mouth every 12 (twelve) hours as needed for up to 20 days for severe pain.   pantoprazole 40 MG tablet Commonly known as: PROTONIX Take 1 tablet (40 mg total) by mouth daily. Start taking on: October 20, 2022   polyethylene glycol 17 g packet Commonly known as: MIRALAX / GLYCOLAX Take 17 g by mouth daily. Start taking on: October 20, 2022   senna 8.6 MG Tabs tablet Commonly known as: SENOKOT Take 1 tablet (8.6 mg total) by mouth 2 (two) times daily.       No Known Allergies  Contact information for after-discharge care     [facility] .   Service: Skilled Nursing Contact information: [address] [place] [phone]                      The results of significant diagnostics from this hospitalization (including imaging, microbiology, ancillary and laboratory) are listed below for reference.    Significant Diagnostic Studies: MR THORACIC SPINE W WO CONTRAST  Result Date: 10/13/2022 CLINICAL DATA:  Osseous metastatic disease EXAM: MRI CERVICAL, THORACIC AND LUMBAR SPINE WITHOUT AND WITH CONTRAST TECHNIQUE: Multiplanar and multiecho pulse sequences of the cervical spine, to include the craniocervical junction and cervicothoracic junction, and thoracic and lumbar spine, were obtained without and with intravenous contrast. CONTRAST:  43m GADAVIST GADOBUTROL 1 MMOL/ML IV SOLN COMPARISON:  None available FINDINGS: MRI CERVICAL SPINE FINDINGS Alignment: Normal Vertebrae:  No fracture, evidence of discitis, or bone lesion. Cord: Normal signal and morphology. Posterior Fossa, vertebral arteries, paraspinal tissues: Negative Disc levels: C1-2: Unremarkable. C2-3: Small central disc protrusion. There is no spinal canal stenosis. No neural foraminal stenosis. C3-4: Normal disc space and facet joints. There is no spinal canal stenosis. No neural foraminal stenosis. C4-5: Normal disc space and facet joints. There is no spinal canal stenosis. No neural foraminal stenosis. C5-6: Small disc bulge. There is no spinal canal stenosis. Mild left neural foraminal stenosis. C6-7: Normal disc space and facet joints. There is no spinal canal stenosis. No neural foraminal stenosis. C7-T1: Normal disc space and facet joints. There is no spinal canal stenosis. No neural foraminal stenosis. MRI THORACIC SPINE FINDINGS Alignment:  Physiologic. Vertebrae: There are multiple expansile, contrast-enhancing masses. There are circumferential lesions at T2 and T3 that encroach on the spinal canal, particularly at right T3. There is a similar lesion of T6 that also encroaches on the spinal canal and causes severe stenosis of the thecal sac. Mild T4 compression deformity. Edema  at T4 has resolved. Cord:  Normal signal and morphology. Paraspinal and other soft tissues: Pulmonary nodules are better assessed on the earlier CT. Disc levels: The thecal sac is effaced at the T2, T3 and T6 levels. There is moderate mass effect on the spinal cord at the T6 level. MRI LUMBAR SPINE FINDINGS Segmentation:  Standard. Alignment:  Normal Vertebrae: No fracture, evidence of discitis, or bone lesion. L1 hemangioma. Conus medullaris and cauda equina: Conus extends to the L1 level. Conus and cauda equina appear normal. Paraspinal and other soft tissues: Negative Disc levels: L1-L2: Normal disc space and facet joints. No spinal canal stenosis. No neural foraminal stenosis. L2-L3: Normal disc space and facet joints. No spinal canal  stenosis. No neural foraminal stenosis. L3-L4: Normal disc space and facet joints. No spinal canal stenosis. No neural foraminal stenosis. L4-L5: Left asymmetric disc bulge with narrowing of left lateral recess and posterior displacement of the left L5 nerve root. No central spinal canal stenosis. No neural foraminal stenosis. L5-S1: Left asymmetric disc bulge with mild left lateral recess narrowing and posterior displacement of the left S1 nerve root. No spinal canal stenosis. No neural foraminal stenosis. Visualized sacrum: Normal. IMPRESSION: 1. Multiple expansile, contrast-enhancing masses of the upper thoracic spine with epidural extension causing encroachment on the thecal sac. Moderate mass effect on the spinal cord at the T6 level 2. No metastatic disease of the cervical or lumbar spine. 3. Left lateral recess narrowing at L4-5 and L5-S1 with posterior displacement of the left L5 and S1 nerve roots. 4. Resolution of edema at T4 without progression of height loss. Electronically Signed   By: Ulyses Jarred M.D.   On: 10/13/2022 00:20   MR Lumbar Spine W Wo Contrast  Result Date: 10/13/2022 CLINICAL DATA:  Osseous metastatic disease EXAM: MRI CERVICAL, THORACIC AND LUMBAR SPINE WITHOUT AND WITH CONTRAST TECHNIQUE: Multiplanar and multiecho pulse sequences of the cervical spine, to include the craniocervical junction and cervicothoracic junction, and thoracic and lumbar spine, were obtained without and with intravenous contrast. CONTRAST:  82m GADAVIST GADOBUTROL 1 MMOL/ML IV SOLN COMPARISON:  None available FINDINGS: MRI CERVICAL SPINE FINDINGS Alignment: Normal Vertebrae: No fracture, evidence of discitis, or bone lesion. Cord: Normal signal and morphology. Posterior Fossa, vertebral arteries, paraspinal tissues: Negative Disc levels: C1-2: Unremarkable. C2-3: Small central disc protrusion. There is no spinal canal stenosis. No neural foraminal stenosis. C3-4: Normal disc space and facet joints. There is no  spinal canal stenosis. No neural foraminal stenosis. C4-5: Normal disc space and facet joints. There is no spinal canal stenosis. No neural foraminal stenosis. C5-6: Small disc bulge. There is no spinal canal stenosis. Mild left neural foraminal stenosis. C6-7: Normal disc space and facet joints. There is no spinal canal stenosis. No neural foraminal stenosis. C7-T1: Normal disc space and facet joints. There is no spinal canal stenosis. No neural foraminal stenosis. MRI THORACIC SPINE FINDINGS Alignment:  Physiologic. Vertebrae: There are multiple expansile, contrast-enhancing masses. There are circumferential lesions at T2 and T3 that encroach on the spinal canal, particularly at right T3. There is a similar lesion of T6 that also encroaches on the spinal canal and causes severe stenosis of the thecal sac. Mild T4 compression deformity. Edema at T4 has resolved. Cord:  Normal signal and morphology. Paraspinal and other soft tissues: Pulmonary nodules are better assessed on the earlier CT. Disc levels: The thecal sac is effaced at the T2, T3 and T6 levels. There is moderate mass effect on the spinal cord at the  T6 level. MRI LUMBAR SPINE FINDINGS Segmentation:  Standard. Alignment:  Normal Vertebrae: No fracture, evidence of discitis, or bone lesion. L1 hemangioma. Conus medullaris and cauda equina: Conus extends to the L1 level. Conus and cauda equina appear normal. Paraspinal and other soft tissues: Negative Disc levels: L1-L2: Normal disc space and facet joints. No spinal canal stenosis. No neural foraminal stenosis. L2-L3: Normal disc space and facet joints. No spinal canal stenosis. No neural foraminal stenosis. L3-L4: Normal disc space and facet joints. No spinal canal stenosis. No neural foraminal stenosis. L4-L5: Left asymmetric disc bulge with narrowing of left lateral recess and posterior displacement of the left L5 nerve root. No central spinal canal stenosis. No neural foraminal stenosis. L5-S1: Left  asymmetric disc bulge with mild left lateral recess narrowing and posterior displacement of the left S1 nerve root. No spinal canal stenosis. No neural foraminal stenosis. Visualized sacrum: Normal. IMPRESSION: 1. Multiple expansile, contrast-enhancing masses of the upper thoracic spine with epidural extension causing encroachment on the thecal sac. Moderate mass effect on the spinal cord at the T6 level 2. No metastatic disease of the cervical or lumbar spine. 3. Left lateral recess narrowing at L4-5 and L5-S1 with posterior displacement of the left L5 and S1 nerve roots. 4. Resolution of edema at T4 without progression of height loss. Electronically Signed   By: Ulyses Jarred M.D.   On: 10/13/2022 00:20   MR CERVICAL SPINE W WO CONTRAST  Result Date: 10/13/2022 CLINICAL DATA:  Osseous metastatic disease EXAM: MRI CERVICAL, THORACIC AND LUMBAR SPINE WITHOUT AND WITH CONTRAST TECHNIQUE: Multiplanar and multiecho pulse sequences of the cervical spine, to include the craniocervical junction and cervicothoracic junction, and thoracic and lumbar spine, were obtained without and with intravenous contrast. CONTRAST:  50m GADAVIST GADOBUTROL 1 MMOL/ML IV SOLN COMPARISON:  None available FINDINGS: MRI CERVICAL SPINE FINDINGS Alignment: Normal Vertebrae: No fracture, evidence of discitis, or bone lesion. Cord: Normal signal and morphology. Posterior Fossa, vertebral arteries, paraspinal tissues: Negative Disc levels: C1-2: Unremarkable. C2-3: Small central disc protrusion. There is no spinal canal stenosis. No neural foraminal stenosis. C3-4: Normal disc space and facet joints. There is no spinal canal stenosis. No neural foraminal stenosis. C4-5: Normal disc space and facet joints. There is no spinal canal stenosis. No neural foraminal stenosis. C5-6: Small disc bulge. There is no spinal canal stenosis. Mild left neural foraminal stenosis. C6-7: Normal disc space and facet joints. There is no spinal canal stenosis. No  neural foraminal stenosis. C7-T1: Normal disc space and facet joints. There is no spinal canal stenosis. No neural foraminal stenosis. MRI THORACIC SPINE FINDINGS Alignment:  Physiologic. Vertebrae: There are multiple expansile, contrast-enhancing masses. There are circumferential lesions at T2 and T3 that encroach on the spinal canal, particularly at right T3. There is a similar lesion of T6 that also encroaches on the spinal canal and causes severe stenosis of the thecal sac. Mild T4 compression deformity. Edema at T4 has resolved. Cord:  Normal signal and morphology. Paraspinal and other soft tissues: Pulmonary nodules are better assessed on the earlier CT. Disc levels: The thecal sac is effaced at the T2, T3 and T6 levels. There is moderate mass effect on the spinal cord at the T6 level. MRI LUMBAR SPINE FINDINGS Segmentation:  Standard. Alignment:  Normal Vertebrae: No fracture, evidence of discitis, or bone lesion. L1 hemangioma. Conus medullaris and cauda equina: Conus extends to the L1 level. Conus and cauda equina appear normal. Paraspinal and other soft tissues: Negative Disc levels: L1-L2:  Normal disc space and facet joints. No spinal canal stenosis. No neural foraminal stenosis. L2-L3: Normal disc space and facet joints. No spinal canal stenosis. No neural foraminal stenosis. L3-L4: Normal disc space and facet joints. No spinal canal stenosis. No neural foraminal stenosis. L4-L5: Left asymmetric disc bulge with narrowing of left lateral recess and posterior displacement of the left L5 nerve root. No central spinal canal stenosis. No neural foraminal stenosis. L5-S1: Left asymmetric disc bulge with mild left lateral recess narrowing and posterior displacement of the left S1 nerve root. No spinal canal stenosis. No neural foraminal stenosis. Visualized sacrum: Normal. IMPRESSION: 1. Multiple expansile, contrast-enhancing masses of the upper thoracic spine with epidural extension causing encroachment on the  thecal sac. Moderate mass effect on the spinal cord at the T6 level 2. No metastatic disease of the cervical or lumbar spine. 3. Left lateral recess narrowing at L4-5 and L5-S1 with posterior displacement of the left L5 and S1 nerve roots. 4. Resolution of edema at T4 without progression of height loss. Electronically Signed   By: Ulyses Jarred M.D.   On: 10/13/2022 00:20   CT Angio Chest PE W/Cm &/Or Wo Cm  Result Date: 10/12/2022 CLINICAL DATA:  History of cholangiocarcinoma with right-sided chest pain radiating into the back EXAM: CT ANGIOGRAPHY CHEST WITH CONTRAST TECHNIQUE: Multidetector CT imaging of the chest was performed using the standard protocol during bolus administration of intravenous contrast. Multiplanar CT image reconstructions and MIPs were obtained to evaluate the vascular anatomy. RADIATION DOSE REDUCTION: This exam was performed according to the departmental dose-optimization program which includes automated exposure control, adjustment of the mA and/or kV according to patient size and/or use of iterative reconstruction technique. CONTRAST:  54m OMNIPAQUE IOHEXOL 350 MG/ML SOLN COMPARISON:  CT chest dated 10/26/2021 FINDINGS: Cardiovascular: The study is high quality for the evaluation of pulmonary embolism. There are no filling defects in the central, lobar, segmental or subsegmental pulmonary artery branches to suggest acute pulmonary embolism. Ascending aorta measures 4.1 cm, unchanged. Right chest wall port terminates at the superior cavoatrial junction. Normal heart size. No significant pericardial fluid/thickening. Coronary artery calcifications and aortic atherosclerosis. Mediastinum/Nodes: Imaged thyroid gland without nodules meeting criteria for imaging follow-up by size. Normal esophagus. Unchanged 10 mm right hilar lymph node (5:62). Lungs/Pleura: The central airways are patent. Scattered pulmonary nodules are new or slightly increased in size, for example left upper lobe 6 x 5  mm nodule (6:29), previously 2 mm, subpleural left upper lobe nodule measuring 5 x 4 mm (6:67), previously 2 mm, and subpleural 4 x 4 mm right lower lobe nodule (6:60). New 2 mm subpleural left upper lobe ground-glass nodule (6:36) and 3 mm left lower lobe nodule (6:89). Unchanged left lower lobe 3 mm perifissural nodule (6:74). No focal consolidation. No pneumothorax. No pleural effusion. Upper abdomen: Increased atrophic appearance of the right hepatic lobe. Additional multifocal hypoattenuating foci measuring up to 3.4 cm in segment 2/3 (5:128) are new. Musculoskeletal: Lytic and sclerotic appearance of T2 and T6 and right transverse process of T3 are new. Similar compression deformity of T4 with increased lytic appearance. Review of the MIP images confirms the above findings. IMPRESSION: 1. No acute pulmonary embolism. 2. New and increased size of scattered pulmonary nodules, suspicious for metastatic disease. 3. New multifocal hypoattenuating foci in the liver, suspicious for metastatic disease. 4. New lytic and sclerotic appearance of T2 and T6 and right transverse process of T3, suspicious for osseous metastatic disease. Similar compression deformity of T4 with increased  lytic appearance. 5. Coronary artery calcifications. Aortic Atherosclerosis (ICD10-I70.0). Electronically Signed   By: Darrin Nipper M.D.   On: 10/12/2022 14:21   DG Chest 2 View  Result Date: 10/12/2022 CLINICAL DATA:  Chest pain. EXAM: CHEST - 2 VIEW COMPARISON:  May 13, 2021. FINDINGS: The heart size and mediastinal contours are within normal limits. Both lungs are clear. Right internal jugular Port-A-Cath is noted with distal tip in expected position of the SVC. The visualized skeletal structures are unremarkable. IMPRESSION: No active cardiopulmonary disease. Electronically Signed   By: Marijo Conception M.D.   On: 10/12/2022 11:48    Microbiology: No results found for this or any previous visit (from the past 240 hour(s)).    Labs: Basic Metabolic Panel: Recent Labs  Lab 10/12/22 1103 10/14/22 0410 10/16/22 0451 10/18/22 0435 10/19/22 0437  NA 139 136 141 137 137  K 4.1 4.1 4.7 4.9 5.5*  CL 102 103 107 104 106  CO2 '27 25 28 26 24  '$ GLUCOSE 118* 157* 134* 146* 143*  BUN 13 25* 32* 57* 74*  CREATININE 0.75 0.88 0.81 1.25* 1.99*  CALCIUM 9.3 9.0 8.9 8.6* 8.1*   Liver Function Tests: Recent Labs  Lab 10/14/22 0410  AST 39  ALT 36  ALKPHOS 238*  BILITOT 0.2*  PROT 6.2*  ALBUMIN 3.1*   No results for input(s): "LIPASE", "AMYLASE" in the last 168 hours. No results for input(s): "AMMONIA" in the last 168 hours. CBC: Recent Labs  Lab 10/12/22 1103 10/14/22 0410 10/15/22 0425 10/18/22 0435  WBC 5.6 7.0 7.5 6.8  NEUTROABS 3.7  --   --  5.5  HGB 14.3 12.9* 12.0* 13.4  HCT 43.3 39.8 36.8* 40.4  MCV 100.7* 100.5* 101.1* 98.3  PLT 132* 143* 127* 152   Cardiac Enzymes: No results for input(s): "CKTOTAL", "CKMB", "CKMBINDEX", "TROPONINI" in the last 168 hours. BNP: BNP (last 3 results) No results for input(s): "BNP" in the last 8760 hours.  ProBNP (last 3 results) No results for input(s): "PROBNP" in the last 8760 hours.  CBG: No results for input(s): "GLUCAP" in the last 168 hours.     Signed:  Nita Sells MD   [facility] 10/19/2022, 10:43 AM

## 2022-10-19 NOTE — TOC Transition Note (Addendum)
Transition of Care [facility]) - CM/SW Discharge Note   Patient Details  Name: Jason Lyons MRN: [id number] Date of Birth: [birth year]  [facility] CM/SW Contact:  Illene Regulus, LCSW Phone Number: 10/19/2022, 9:03 AM   Clinical Narrative:    Pt's son chose [facility] [place] updated MD on pt's choice. Pt does not need insurance auth, can discharge to facility when medically stable. TOC to follow.    Final next level of care: Memory Care Barriers to Discharge: No Barriers Identified   Patient Goals and CMS Choice   Choice offered to / list presented to : Patient  Discharge Placement                         Discharge Plan and Services Additional resources added to the After Visit Summary for   In-house Referral: Clinical Social Work                                   Social Determinants of Health ([place]) Interventions SDOH Screenings   Food Insecurity: No Food Insecurity (10/14/2022)  Housing: Low Risk  (10/14/2022)  Transportation Needs: No Transportation Needs (10/14/2022)  Utilities: Not At Risk (10/14/2022)  Tobacco Use: Medium Risk (10/14/2022)     Readmission Risk Interventions     No data to display

## 2022-10-19 NOTE — Progress Notes (Signed)
PROGRESS NOTE   Jason Lyons  NWG:[id number] DOB: 25-Jun-1956 DOA: 10/12/2022 PCP: Pcp, No  Brief Narrative:  67 year old [place] male with known adeno cancer of the biliary tract diagnosed 05/13/2021-followed by oncologist Dr. Rozetta Nunnery on cisplatin gemcitabine currently managed at [facility] on chemo there Known prior pulmonary embolism Pathological fracture T4 s/p XRt in the past as well  Seen at [facility] ED 10/07/2022 and because of pain in the back as well as numbness bilateral hands and feet was given oxycodone and discharge He has been unable to pass stool for 3 weeks he has control of his urine  Patient brought to [facility] ED 10/12/2022 with right-sided chest pain radiating to back-found to have T6 spinal mets and scattered new pulmonary nodules  Neurosurgery was consulted and did not feel that he was an operative candidate and he was given Decadron and patient was transferred over to [facility] for consideration of XRT which would be palliative  Hospital-Problem based course  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 Irven Coe further to add needs follow-up at [place] to 2 mg every 4 as needed p.o. hold IV for now  Metastatic adenocarcinoma biliary tract diagnosed 05/30/2021 - Follows now at [place] follow-up  Severe constipation - Has not had a stool in over 3 weeks? - Start MiraLAX and senna scheduled this did not work - Patient was disimpacted with small bowel movement on 1/8 -She still not having stool/started Nulytely 1000 cc to see if this washes them out He will need an another attempt at disimpaction  AKI developing during hospital stay - Start IV fluid NS 100 cc/H - Foley catheter  today and see if he is able to void better - If he has further worsening he may need workup with nephrology  Anterior chest/scapular pain - had CP relieved by GI cocktail on 1.5, Start protonix and continue on discharge  Previous pulmonary embolism -Continue Eliquis 5 twice daily  DVT prophylaxis: Eliquis Code Status: Full Family Communication: None at the bedside Disposition:  Status is: Inpatient Remains inpatient appropriate because:   Needs radiation therapy and therapy eval    Subjective: Interviewed with interpreter   Regular 7/10 no fever no chills no nausea no vomiting ROM intact to upper extremities but then spasticity lower-has some sensation-straight leg raise is painful  Objective: Vitals:   10/18/22 0451 10/18/22 1250 10/18/22 2007 10/19/22 0353  BP: 134/68 136/89 138/79 133/78  Pulse: 84 68 71 65  Resp: '20 16 18 19  '$ Temp: 97.9 F (36.6 C) 98 F (36.7 C) 98.4 F (36.9 C) 98.3 F (36.8 C)  TempSrc: Oral Oral Oral Oral  SpO2: 98% 98% 99% 97%  Weight:      Height:        Intake/Output Summary (Last 24 hours) at 10/19/2022 1048 Last data filed at 10/19/2022 0935 Gross per 24 hour  Intake 3652.16 ml  Output 1200 ml  Net 2452.16 ml    Filed Weights   10/14/22 1432  Weight: 61.8 kg    Examination:  Oriented coherent, cachectic S1-S2 no murmur Straight leg raise unable to perform but passively able to do so Abdomen is soft no rebound no guarding Chest is clear no wheeze no rales Affect  is pleasant   Data Reviewed: personally reviewed   CBC    Component Value Date/Time   WBC 6.8 10/18/2022 0435   RBC 4.11 (L) 10/18/2022 0435   HGB 13.4 10/18/2022 0435   HGB 12.9 (L) 10/16/2021 1340   HGB 16.5 04/28/2018 1054   HCT 40.4 10/18/2022 0435   HCT 48.3 04/28/2018 1054   PLT 152 10/18/2022 0435   PLT 220 10/16/2021 1340   PLT 224 04/28/2018 1054   MCV 98.3 10/18/2022 0435   MCV 92 04/28/2018 1054   MCH 32.6 10/18/2022 0435   MCHC 33.2  10/18/2022 0435   RDW 15.3 10/18/2022 0435   RDW 14.8 04/28/2018 1054   LYMPHSABS 0.4 (L) 10/18/2022 0435   MONOABS 0.8 10/18/2022 0435   EOSABS 0.0 10/18/2022 0435   BASOSABS 0.0 10/18/2022 0435      Latest Ref Rng & Units 10/19/2022    4:37 AM 10/18/2022    4:35 AM 10/16/2022    4:51 AM  CMP  Glucose 70 - 99 mg/dL 143  146  134   BUN 8 - 23 mg/dL 74  57  32   Creatinine 0.61 - 1.24 mg/dL 1.99  1.25  0.81   Sodium 135 - 145 mmol/L 137  137  141   Potassium 3.5 - 5.1 mmol/L 5.5  4.9  4.7   Chloride 98 - 111 mmol/L 106  104  107   CO2 22 - 32 mmol/L '24  26  28   '$ Calcium 8.9 - 10.3 mg/dL 8.1  8.6  8.9      Radiology Studies: No results found.   Scheduled Meds:  apixaban  5 mg Oral BID   dexamethasone  4 mg Oral Q6H   feeding supplement  237 mL Oral BID BM   pantoprazole  40 mg Oral Daily   polyethylene glycol-electrolytes  1,000 mL Oral Once   senna  1 tablet Oral BID   Continuous Infusions:  sodium chloride 100 mL/hr at 10/19/22 0525     LOS: 6 days   Time spent: [place], MD [facility] To contact the attending provider between 7A-7P or the covering provider during after hours 7P-7A, please log into the web site [email or web address] and access using universal [place] password for that web site. If you do not have the password, please call the hospital operator.  10/19/2022, 10:48 AM

## 2022-10-20 ENCOUNTER — Inpatient Hospital Stay (HOSPITAL_COMMUNITY): Payer: Medicare Other

## 2022-10-20 ENCOUNTER — Other Ambulatory Visit: Payer: Self-pay

## 2022-10-20 ENCOUNTER — Ambulatory Visit
Admit: 2022-10-20 | Discharge: 2022-10-20 | Disposition: A | Discharge status: Home / Self Care | Payer: Medicare Other | Attending: Radiation Oncology | Admitting: Radiation Oncology

## 2022-10-20 ENCOUNTER — Ambulatory Visit: Payer: Medicare Other

## 2022-10-20 DIAGNOSIS — C249 Malignant neoplasm of biliary tract, unspecified: Secondary | ICD-10-CM

## 2022-10-20 DIAGNOSIS — R29898 Other symptoms and signs involving the musculoskeletal system: Secondary | ICD-10-CM

## 2022-10-20 DIAGNOSIS — R160 Hepatomegaly, not elsewhere classified: Secondary | ICD-10-CM

## 2022-10-20 DIAGNOSIS — E440 Moderate protein-calorie malnutrition: Secondary | ICD-10-CM

## 2022-10-20 DIAGNOSIS — I2699 Other pulmonary embolism without acute cor pulmonale: Secondary | ICD-10-CM

## 2022-10-20 DIAGNOSIS — D61818 Other pancytopenia: Secondary | ICD-10-CM

## 2022-10-20 DIAGNOSIS — G8220 Paraplegia, unspecified: Secondary | ICD-10-CM | POA: Diagnosis not present

## 2022-10-20 LAB — RAD ONC ARIA SESSION SUMMARY
Course Elapsed Days: 6
Plan Fractions Treated to Date: 5
Plan Fractions Treated to Date: 5
Plan Prescribed Dose Per Fraction: 3 Gy
Plan Prescribed Dose Per Fraction: 3 Gy
Plan Total Fractions Prescribed: 10
Plan Total Fractions Prescribed: 6
Plan Total Prescribed Dose: 18 Gy
Plan Total Prescribed Dose: 30 Gy
Reference Point Dosage Given to Date: 15 Gy
Reference Point Dosage Given to Date: 15 Gy
Reference Point Session Dosage Given: 3 Gy
Reference Point Session Dosage Given: 3 Gy
Session Number: 5

## 2022-10-20 LAB — COMPREHENSIVE METABOLIC PANEL
ALT: 89 U/L — ABNORMAL HIGH (ref 0–44)
AST: 46 U/L — ABNORMAL HIGH (ref 15–41)
Albumin: 2.9 g/dL — ABNORMAL LOW (ref 3.5–5.0)
Alkaline Phosphatase: 213 U/L — ABNORMAL HIGH (ref 38–126)
Anion gap: 6 (ref 5–15)
BUN: 35 mg/dL — ABNORMAL HIGH (ref 8–23)
CO2: 25 mmol/L (ref 22–32)
Calcium: 8.4 mg/dL — ABNORMAL LOW (ref 8.9–10.3)
Chloride: 106 mmol/L (ref 98–111)
Creatinine, Ser: 0.74 mg/dL (ref 0.61–1.24)
GFR, Estimated: >60 mL/min (ref >60)
Glucose, Bld: 193 mg/dL — ABNORMAL HIGH (ref 70–99)
Potassium: 4.6 mmol/L (ref 3.5–5.1)
Sodium: 137 mmol/L (ref 135–145)
Total Bilirubin: 0.6 mg/dL (ref 0.3–1.2)
Total Protein: 6.2 g/dL — ABNORMAL LOW (ref 6.5–8.1)

## 2022-10-20 LAB — CBC WITH DIFFERENTIAL/PLATELET
Abs Immature Granulocytes: 0.17 10*3/uL — ABNORMAL HIGH (ref 0.00–0.07)
Basophils Absolute: 0.1 10*3/uL (ref 0.0–0.1)
Basophils Relative: 1 %
Eosinophils Absolute: 0 10*3/uL (ref 0.0–0.5)
Eosinophils Relative: 0 %
HCT: 40.8 % (ref 39.0–52.0)
Hemoglobin: 13.8 g/dL (ref 13.0–17.0)
Immature Granulocytes: 2 %
Lymphocytes Relative: 4 %
Lymphs Abs: 0.3 10*3/uL — ABNORMAL LOW (ref 0.7–4.0)
MCH: 32.9 pg (ref 26.0–34.0)
MCHC: 33.8 g/dL (ref 30.0–36.0)
MCV: 97.4 fL (ref 80.0–100.0)
Monocytes Absolute: 0.6 10*3/uL (ref 0.1–1.0)
Monocytes Relative: 7 %
Neutro Abs: 7.2 10*3/uL (ref 1.7–7.7)
Neutrophils Relative %: 86 %
Platelets: 139 10*3/uL — ABNORMAL LOW (ref 150–400)
RBC: 4.19 MIL/uL — ABNORMAL LOW (ref 4.22–5.81)
RDW: 15 % (ref 11.5–15.5)
WBC: 8.4 10*3/uL (ref 4.0–10.5)
nRBC: 0.2 % (ref 0.0–0.2)

## 2022-10-20 LAB — PHOSPHORUS: Phosphorus: 4.2 mg/dL (ref 2.5–4.6)

## 2022-10-20 LAB — MAGNESIUM: Magnesium: 2.4 mg/dL (ref 1.7–2.4)

## 2022-10-20 NOTE — Progress Notes (Signed)
PROGRESS NOTE    Jason Lyons  JIR:[id number] DOB: 05-08-1956 DOA: 10/12/2022 PCP: Pcp, No   Brief Narrative:  The patient is a 67 year old [place] male with a past medical history significant for but not limited to known adenocarcinoma of the biliary tract diagnosed in 05/13/2021 followed by Dr. Alvy Bimler on chemotherapy with cisplatin and gemcitabine but not currently managed at [facility] as he is on chemo there and history of known prior pulmonary embolism, pathological fracture T4 status post XRT in the past as well and other comorbidities was seen in the [facility] ED and 09/30/2019 because of pain in the back as well as numbness of bilateral hands and feet.  He was given oxycodone and was discharged home and was noted to not be able to pass stool for the last 3 weeks but still had control of his urine.  He is brought back to the ED on 10/12/2022 at [facility] with right-sided chest pain that radiated to the back and was found to have a T6 spinal metastasis as well as new scattered pulmonary nodules.  Neurosurgery was consulted and did not feel the patient was an operative candidate and he was given Decadron and transferred over to [facility] for consideration of radiation therapy which would be palliative.  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  He remains on steroids and pain is improving and he is getting radiation treatments.  Also did not had a really good bowel movement so had been initiated on Nulytely bowel regimen.  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 Richmond Campbell evaluated and unfortunately the patient had progressive metastatic disease at T2 and T6 with compressive lesions and neurosurgery feels  that he is not a surgical candidate given his continued failure to systemic treatment and length of time he has had lower extremity weakness -Not CIR candidate, cannot manage at home so needs skilled facility placement -Dr. Verlon Au Discussed with patient's former oncologist Dr. Alvy Bimler and she hadnothing further to add as he needs follow-up at [facility] -C/w Pain control with Oxycodone IR 5 every 4 as needed moderate, Dilaudid 1 mg every 4 as needed severe   Metastatic Adenocarcinoma biliary tract diagnosed 05/30/2021 -Follows now at [place] follow-up   Severe Constipation -Has not had a stool in over 3 weeks? 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 -Given  urinary retention Foley catheter was then placed and will do a trial of void in outpatient setting -Avoid Nephrotoxic Medications, Contrast Dyes, Hypotension and Dehydration to Ensure Adequate Renal Perfusion and will need to Renally Adjust Meds -Continue to Monitor and Trend Renal Function carefully and repeat CMP in the outpatient  setting  Hypoalbuminemia -Patient's Albumin Level went from 3.1 -> 2.9 -Continue to Monitor and Trend and Repeat in the outpatient setting    Anterior Chest/Scapular Pain, improving  -had CP relieved by GI cocktail on 10/15/22 -Started PPI with Pantoprazole  -No further Chest Pain    Thrombocytopenia -Patient's platelet count Trend: Recent Labs  Lab 10/12/22 1103 10/14/22 0410 10/15/22 0425 10/18/22 0435 10/20/22 0853  PLT 132* 143* 127* 152 139*  -Continue to Monitor for S/Sx of Bleeding; No overt bleeding noted -Repeat CBC in the AM   Hx of Pulmonary Embolism -Continue anticoagulation with Apixaban 5 twice daily  Nonsevere Moderate Malnutrition in the Context of Chronic Illness -Nutrition Status: Nutrition Problem: Moderate Malnutrition Etiology: chronic illness (cholangiocarcinoma, metastatic to bone) Signs/Symptoms: moderate muscle depletion, moderate fat depletion Interventions: Ensure Enlive (each supplement provides 350kcal and 20 grams of protein), Refer to RD note for recommendations   DVT prophylaxis: SCDs Start: 10/13/22 0536 apixaban (ELIQUIS) tablet 5 mg    Code Status: Full Code Family Communication: No family currently at bedside  Disposition Plan:  Level of care: Telemetry Status is: Inpatient Remains inpatient appropriate because: His further clinical improvement and further workup of his abnormal LFTs prior to discharging.  He will likely go to SNF in next 24 to 48 hours given that his renal function is now improved   Consultants:  Radiation oncology Medical Oncology Neurosurgery  Procedures:  As delineated as above  Antimicrobials:  Anti-infectives (From admission, onward)    None        Subjective: Seen and examined at bedside with the assistance of the [place] translator and #[id number] and patient continues to have some pain but feels about the same as yesterday.  States that he is 75% better since coming to the hospital.  No nausea or  vomiting.  States that he feels very uncomfortable trying to have a bowel movement and states that he has not had a bowel movement however nursing states that he had a large bowel movement yesterday.  He denies any other lightheadedness or dizziness and continues to not be able to use his legs.  He has no other concerns or complaints at this time and anticipating going to SNF in the next 24 to 48 hours.  Objective: Vitals:   10/19/22 1228 10/19/22 2207 10/20/22 0513 10/20/22 1302  BP: 135/86 131/73 116/80 134/75  Pulse: 69 65 64 60  Resp: '16 17 18 16  '$ Temp: (!) 97.5 F (36.4 C) 98.1 F (36.7 C) 98.1 F (36.7 C) 98 F (36.7 C)  TempSrc: Oral Oral Oral   SpO2: 98% 97% 96% 98%  Weight:      Height:        Intake/Output Summary (Last 24 hours) at 10/20/2022 1639 Last data filed at 10/20/2022 1226 Gross per 24 hour  Intake 1223.33 ml  Output 3000 ml  Net -1776.67 ml   Filed Weights   10/14/22 1432  Weight: 61.8 kg   Examination: Physical Exam:  Constitutional: Chronically ill-appearing [place] male in no acute distress appears calm Respiratory: Diminished to auscultation bilaterally with coarse breath sounds, no wheezing, rales, rhonchi or crackles. Normal respiratory effort and patient is not tachypenic. No accessory  muscle use.  Unlabored breathing Cardiovascular: RRR, no murmurs / rubs / gallops. S1 and S2 auscultated. No extremity edema. Abdomen: Soft, non-tender, non-distended. Bowel sounds positive.  GU: Deferred. Musculoskeletal: No clubbing / cyanosis of digits/nails. No joint deformity upper and lower extremities. Skin: No rashes, lesions, ulcers on the skin evaluation and legs are in boots. No induration; Warm and dry.  Neurologic: CN 2-12 grossly intact with no focal deficits.  Not able to really move his legs. Psychiatric: Normal judgment and insight. Alert and oriented x 3. Normal mood and appropriate affect.   Data Reviewed: I have personally reviewed following  labs and imaging studies  CBC: Recent Labs  Lab 10/14/22 0410 10/15/22 0425 10/18/22 0435 10/20/22 0853  WBC 7.0 7.5 6.8 8.4  NEUTROABS  --   --  5.5 7.2  HGB 12.9* 12.0* 13.4 13.8  HCT 39.8 36.8* 40.4 40.8  MCV 100.5* 101.1* 98.3 97.4  PLT 143* 127* 152 122*   Basic Metabolic Panel: Recent Labs  Lab 10/14/22 0410 10/16/22 0451 10/18/22 0435 10/19/22 0437 10/20/22 0853  NA 136 141 137 137 137  K 4.1 4.7 4.9 5.5* 4.6  CL 103 107 104 106 106  CO2 '25 28 26 24 25  '$ GLUCOSE 157* 134* 146* 143* 193*  BUN 25* 32* 57* 74* 35*  CREATININE 0.88 0.81 1.25* 1.99* 0.74  CALCIUM 9.0 8.9 8.6* 8.1* 8.4*  MG  --   --   --   --  2.4  PHOS  --   --   --   --  4.2   GFR: Estimated Creatinine Clearance: 76.9 mL/min (by C-G formula based on SCr of 0.74 mg/dL). Liver Function Tests: Recent Labs  Lab 10/14/22 0410 10/20/22 0853  AST 39 46*  ALT 36 89*  ALKPHOS 238* 213*  BILITOT 0.2* 0.6  PROT 6.2* 6.2*  ALBUMIN 3.1* 2.9*   No results for input(s): "LIPASE", "AMYLASE" in the last 168 hours. No results for input(s): "AMMONIA" in the last 168 hours. Coagulation Profile: No results for input(s): "INR", "PROTIME" in the last 168 hours. Cardiac Enzymes: No results for input(s): "CKTOTAL", "CKMB", "CKMBINDEX", "TROPONINI" in the last 168 hours. BNP (last 3 results) No results for input(s): "PROBNP" in the last 8760 hours. HbA1C: No results for input(s): "HGBA1C" in the last 72 hours. CBG: No results for input(s): "GLUCAP" in the last 168 hours. Lipid Profile: No results for input(s): "CHOL", "HDL", "LDLCALC", "TRIG", "CHOLHDL", "LDLDIRECT" in the last 72 hours. Thyroid Function Tests: No results for input(s): "TSH", "T4TOTAL", "FREET4", "T3FREE", "THYROIDAB" in the last 72 hours. Anemia Panel: No results for input(s): "VITAMINB12", "FOLATE", "FERRITIN", "TIBC", "IRON", "RETICCTPCT" in the last 72 hours. Sepsis Labs: No results for input(s): "PROCALCITON", "LATICACIDVEN" in the  last 168 hours.  No results found for this or any previous visit (from the past 240 hour(s)).   Radiology Studies: No results found.  Scheduled Meds:  apixaban  5 mg Oral BID   dexamethasone  4 mg Oral Q6H   feeding supplement  237 mL Oral BID BM   pantoprazole  40 mg Oral Daily   senna  1 tablet Oral BID   Continuous Infusions:   LOS: 7 days   Raiford Noble, DO [facility] Available via Epic secure chat 7am-7pm After these hours, please refer to coverage provider listed on [email or web address] 10/20/2022, 4:39 PM

## 2022-10-21 ENCOUNTER — Ambulatory Visit
Admit: 2022-10-21 | Discharge: 2022-10-21 | Disposition: A | Discharge status: Home / Self Care | Payer: Medicare Other | Attending: Radiation Oncology | Admitting: Radiation Oncology

## 2022-10-21 ENCOUNTER — Inpatient Hospital Stay (HOSPITAL_COMMUNITY): Payer: Medicare Other

## 2022-10-21 ENCOUNTER — Other Ambulatory Visit: Payer: Self-pay

## 2022-10-21 ENCOUNTER — Ambulatory Visit: Payer: Medicare Other

## 2022-10-21 DIAGNOSIS — R29898 Other symptoms and signs involving the musculoskeletal system: Secondary | ICD-10-CM | POA: Diagnosis not present

## 2022-10-21 DIAGNOSIS — C249 Malignant neoplasm of biliary tract, unspecified: Secondary | ICD-10-CM | POA: Diagnosis not present

## 2022-10-21 DIAGNOSIS — G8220 Paraplegia, unspecified: Secondary | ICD-10-CM | POA: Diagnosis not present

## 2022-10-21 DIAGNOSIS — R160 Hepatomegaly, not elsewhere classified: Secondary | ICD-10-CM | POA: Diagnosis not present

## 2022-10-21 LAB — RAD ONC ARIA SESSION SUMMARY
Course Elapsed Days: 7
Plan Fractions Treated to Date: 6
Plan Fractions Treated to Date: 6
Plan Prescribed Dose Per Fraction: 3 Gy
Plan Prescribed Dose Per Fraction: 3 Gy
Plan Total Fractions Prescribed: 10
Plan Total Fractions Prescribed: 6
Plan Total Prescribed Dose: 18 Gy
Plan Total Prescribed Dose: 30 Gy
Reference Point Dosage Given to Date: 18 Gy
Reference Point Dosage Given to Date: 18 Gy
Reference Point Session Dosage Given: 3 Gy
Reference Point Session Dosage Given: 3 Gy
Session Number: 6

## 2022-10-21 MED ORDER — CHLORHEXIDINE GLUCONATE CLOTH 2 % EX PADS
6.0000 | MEDICATED_PAD | Freq: Every day | CUTANEOUS | Status: DC
  Administered 2022-10-21 – 2022-10-28 (×7): 6 via TOPICAL

## 2022-10-21 NOTE — Progress Notes (Signed)
PROGRESS NOTE    Jason Lyons  JSE:[id number] DOB: 1956-07-15 DOA: 10/12/2022 PCP: Pcp, No   Brief Narrative:  The patient is a 67 year old [place] male with a past medical history significant for but not limited to known adenocarcinoma of the biliary tract diagnosed in 05/13/2021 followed by Dr. Alvy Bimler on chemotherapy with cisplatin and gemcitabine but not currently managed at [facility] as he is on chemo there and history of known prior pulmonary embolism, pathological fracture T4 status post XRT in the past as well and other comorbidities was seen in the [facility] ED and 09/30/2019 because of pain in the back as well as numbness of bilateral hands and feet.  He was given oxycodone and was discharged home and was noted to not be able to pass stool for the last 3 weeks but still had control of his urine.  He is brought back to the ED on 10/12/2022 at [facility] with right-sided chest pain that radiated to the back and was found to have a T6 spinal metastasis as well as new scattered pulmonary nodules.  Neurosurgery was consulted and did not feel the patient was an operative candidate and he was given Decadron and transferred over to [facility] for consideration of radiation therapy which would be palliative.  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  He remains on steroids and pain is improving and he is getting radiation treatments.  Also did not had a really good bowel movement so had been initiated on Nulytely bowel regimen.  Now having bowel movements and is improved significantly.  He is medically stable for discharge unfortunately cannot be discharged until he completes his radiation treatments given that the SNF cannot arrange daily transport back and forth to the radiation center.  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 Verlon Au Discussed with patient's former oncologist Dr. Alvy Bimler and she hadnothing further to add as he needs follow-up at [facility] -C/w Pain control with Oxycodone IR 5 every 4 as needed moderate, Dilaudid 1 mg every 4 as needed severe   Metastatic Adenocarcinoma biliary tract diagnosed 05/30/2021 -Follows now at [facility] follow-up   Severe Constipation -Reported that he had not had a stool in over 3 weeks? 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 Gallbladder  is not visualized.  Reported prior cholecystectomy. Common bile duct is not visualized." -Continue to [place] and Trend and repeat CMP in the outpatient setting   AKI, improved -BUN/Cr Trend: Recent Labs  Lab 10/12/22 1103 10/14/22 0410 10/16/22 0451 10/18/22 0435 10/19/22 0437 10/20/22 0853  BUN 13 25*  32* 57* 74* 35*  CREATININE 0.75 0.88 0.81 1.25* 1.99* 0.74  -He was initiated on IV fluid hydration with normal saline at 100 MLS per hour which will now stop. -Given urinary retention Foley catheter was then placed and will do a trial of void in outpatient setting -Avoid Nephrotoxic Medications, Contrast Dyes, Hypotension and Dehydration to Ensure Adequate Renal Perfusion and will need to Renally Adjust Meds -Continue to Monitor and Trend Renal Function carefully and repeat CMP in the outpatient setting   Hypoalbuminemia -Patient's Albumin Level went from 3.1 -> 2.9 om last check  -Continue to Monitor and Trend and Repeat in the outpatient setting    Anterior Chest/Scapular Pain, improving  -had CP relieved by GI cocktail on 10/15/22 -Started PPI with Pantoprazole  -No further Chest Pain    Thrombocytopenia -Patient's platelet count Trend: Recent Labs  Lab 10/12/22 1103 10/14/22 0410 10/15/22 0425 10/18/22 0435 10/20/22 0853  PLT 132* 143* 127* 152 139*  -Continue to Monitor for S/Sx of Bleeding; No overt bleeding noted -Repeat CBC in the AM    Hx of Pulmonary Embolism -Continue anticoagulation with Apixaban 5 twice daily   [place] Moderate Malnutrition in the Context of Chronic Illness -Nutrition Status: Nutrition Problem: Moderate Malnutrition Etiology: chronic illness (cholangiocarcinoma, metastatic to bone) Signs/Symptoms: moderate muscle depletion, moderate fat depletion Interventions: Ensure Enlive (each supplement provides 350kcal and 20 grams of protein), Refer to RD note for recommendations  DVT prophylaxis: SCDs Start: 10/13/22 0536 apixaban (ELIQUIS) tablet 5 mg    Code Status: Full Code Family Communication:   Disposition Plan:  Level of care: Telemetry Status is: Inpatient Remains inpatient appropriate because: He is medically stable for discharge at this time however culture cannot be discharged due to inability to be transported back and forth from SNF  to the radiation center daily; last day of radiation is 10/27/2022   Consultants:  Radiation oncology Medical oncology Neurosurgery  Procedures:  As delineated as above   Antimicrobials:  Anti-infectives (From admission, onward)    None       Subjective: Seen and examined at bedside and he states that he is doing about the same.  He is seen and examined with assistance of the [place] translator [phone].  Still cannot really use his legs.  Denies any abdominal discomfort today.  States that he has not been having any bowel movements however nursing states otherwise and states that he has had multiple bowel movements.  No other concerns or close at this time.  Objective: Vitals:   10/20/22 1302 10/20/22 2106 10/21/22 0506 10/21/22 1246  BP: 134/75 116/70 131/80 114/69  Pulse: 60 73 70 84  Resp: '16 18 18 16  '$ Temp: 98 F (36.7 C) 97.8 F (36.6 C) 97.8 F (36.6 C) 98.1 F (36.7 C)  TempSrc:  Oral Oral Oral  SpO2: 98% 96% 98% 98%  Weight:   63.6 kg   Height:        Intake/Output Summary (Last 24 hours) at 10/21/2022 1512 Last data filed at 10/21/2022 1410 Gross per 24 hour  Intake --  Output 3200 ml  Net -3200 ml   Filed Weights   10/14/22 1432 10/21/22 0506  Weight: 61.8  kg 63.6 kg   Examination: Physical Exam:  Constitutional: Chronically ill-appearing [place] male in no acute distress Respiratory: Diminished to auscultation bilaterally, no wheezing, rales, rhonchi or crackles. Normal respiratory effort and patient is not tachypenic. No accessory muscle use.  Unlabored breathing Cardiovascular: RRR, no murmurs / rubs / gallops. S1 and S2 auscultated. No extremity edema.  Abdomen: Soft, non-tender, non-distended. Bowel sounds positive.  GU: Deferred. Musculoskeletal: No clubbing / cyanosis of digits/nails. No joint deformity upper and lower extremities but both the legs are in pressure boots.  Skin: No rashes, lesions, ulcers on limited skin evaluation. No  induration; Warm and dry.  Neurologic: CN 2-12 grossly intact with no focal deficits. Romberg sign and cerebellar reflexes not assessed.  Psychiatric: Normal judgment and insight. Alert and oriented x 3. Normal mood and appropriate affect.   Data Reviewed: I have personally reviewed following labs and imaging studies  CBC: Recent Labs  Lab 10/15/22 0425 10/18/22 0435 10/20/22 0853  WBC 7.5 6.8 8.4  NEUTROABS  --  5.5 7.2  HGB 12.0* 13.4 13.8  HCT 36.8* 40.4 40.8  MCV 101.1* 98.3 97.4  PLT 127* 152 097*   Basic Metabolic Panel: Recent Labs  Lab 10/16/22 0451 10/18/22 0435 10/19/22 0437 10/20/22 0853  NA 141 137 137 137  K 4.7 4.9 5.5* 4.6  CL 107 104 106 106  CO2 '28 26 24 25  '$ GLUCOSE 134* 146* 143* 193*  BUN 32* 57* 74* 35*  CREATININE 0.81 1.25* 1.99* 0.74  CALCIUM 8.9 8.6* 8.1* 8.4*  MG  --   --   --  2.4  PHOS  --   --   --  4.2   GFR: Estimated Creatinine Clearance: 76.9 mL/min (by C-G formula based on SCr of 0.74 mg/dL). Liver Function Tests: Recent Labs  Lab 10/20/22 0853  AST 46*  ALT 89*  ALKPHOS 213*  BILITOT 0.6  PROT 6.2*  ALBUMIN 2.9*   No results for input(s): "LIPASE", "AMYLASE" in the last 168 hours. No results for input(s): "AMMONIA" in the last 168 hours. Coagulation Profile: No results for input(s): "INR", "PROTIME" in the last 168 hours. Cardiac Enzymes: No results for input(s): "CKTOTAL", "CKMB", "CKMBINDEX", "TROPONINI" in the last 168 hours. BNP (last 3 results) No results for input(s): "PROBNP" in the last 8760 hours. HbA1C: No results for input(s): "HGBA1C" in the last 72 hours. CBG: No results for input(s): "GLUCAP" in the last 168 hours. Lipid Profile: No results for input(s): "CHOL", "HDL", "LDLCALC", "TRIG", "CHOLHDL", "LDLDIRECT" in the last 72 hours. Thyroid Function Tests: No results for input(s): "TSH", "T4TOTAL", "FREET4", "T3FREE", "THYROIDAB" in the last 72 hours. Anemia Panel: No results for input(s): "VITAMINB12",  "FOLATE", "FERRITIN", "TIBC", "IRON", "RETICCTPCT" in the last 72 hours. Sepsis Labs: No results for input(s): "PROCALCITON", "LATICACIDVEN" in the last 168 hours.  No results found for this or any previous visit (from the past 240 hour(s)).   Radiology Studies: US Abdomen Limited RUQ (LIVER/GB)  Result Date: 10/21/2022 CLINICAL DATA:  Abnormal LFTs EXAM: ULTRASOUND ABDOMEN LIMITED RIGHT UPPER QUADRANT COMPARISON:  CT 10/26/2021 FINDINGS: Gallbladder: Not visualized.  Reported prior cholecystectomy. Common bile duct: Common bile duct is not visualized. Liver: There are heterogeneous nodular lesions with indistinct borders, measuring 3.4 x 2.0 x 3.6 cm and 2.9 x 1.9 x 2.7 cm. These likely correspond to the measured lesions on recent chest CT. Portal vein is patent on color Doppler imaging with normal direction of blood flow towards the liver. Other: None. IMPRESSION: Heterogeneous nodular  lesions in the liver measuring up to 3.6 cm and 2.9 cm, suspicious for metastatic disease, as seen on recent chest CT. Gallbladder is not visualized.  Reported prior cholecystectomy. Common bile duct is not visualized. Electronically Signed   By: Maurine Simmering M.D.   On: 10/21/2022 08:11    Scheduled Meds:  apixaban  5 mg Oral BID   Chlorhexidine Gluconate Cloth  6 each Topical Daily   dexamethasone  4 mg Oral Q6H   feeding supplement  237 mL Oral BID BM   pantoprazole  40 mg Oral Daily   senna  1 tablet Oral BID   Continuous Infusions:   LOS: 8 days   Raiford Noble, DO [facility] Available via Epic secure chat 7am-7pm After these hours, please refer to coverage provider listed on [email or web address] 10/21/2022, 3:12 PM

## 2022-10-21 NOTE — Discharge Summary (Incomplete)
Physician Discharge Summary   Patient: Jason Lyons MRN: [id number] DOB: [birth year]  Admit date:     10/12/2022  Discharge date: 10/21/22  Discharge Physician: Raiford Noble, DO   PCP: Pcp, No   Recommendations at discharge:    Follow up with PCP within 1-2 weeks and repeat CBC, CMP, Mag, Phos within 1 week Follow up with Radiation Oncology and continue scheduled Radiation Tx Follow up with Medical Oncology in the outpatient setting Follow up with Neurosurgery in the outpatient setting  Discharge Diagnoses: Principal Problem:   Paraplegia [facility]) Active Problems:   Pulmonary embolism (HCC)   Liver mass, right lobe   Metastatic cancer to spine ([place])   Cancer of biliary tract ([place])   Pancytopenia, acquired ([place])   Lower extremity weakness   Malnutrition of moderate degree  Resolved Problems:   * No resolved hospital problems. *  Hospital Course: he patient is a 67 year old [place] male with a past medical history significant for but not limited to known adenocarcinoma of the biliary tract diagnosed in 05/13/2021 followed by Dr. Alvy Bimler on chemotherapy with cisplatin and gemcitabine but not currently managed at [facility] as he is on chemo there and history of known prior pulmonary embolism, pathological fracture T4 status post XRT in the past as well and other comorbidities was seen in the [facility] ED and 09/30/2019 because of pain in the back as well as numbness of bilateral hands and feet.  He was given oxycodone and was discharged home and was noted to not be able to pass stool for the last 3 weeks but still had control of his urine.  He is brought back to the ED on 10/12/2022 at [facility] with right-sided chest pain that radiated to the back and was found to have a T6 spinal metastasis as well as new scattered pulmonary nodules.  Neurosurgery was consulted and did not feel the patient was an operative candidate and he was given Decadron and transferred over to [facility] for  consideration of radiation therapy which would be palliative.  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  He remains on steroids and pain is improving and he is getting radiation treatments.  Also did not had a really good bowel movement so had been initiated on Nulytely bowel regimen.   Assessment and Plan: No notes have been filed under this hospital service. 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 Richmond Campbell evaluated and unfortunately the patient had progressive metastatic disease at T2 and T6 with compressive lesions and neurosurgery feels that he is not a surgical candidate given his continued failure to systemic treatment and length of time he has had lower extremity weakness -Not CIR candidate, cannot manage at home so needs skilled facility placement -Dr. Verlon Au Discussed with patient's former oncologist Dr. Alvy Bimler and she hadnothing further to add as he needs follow-up at [facility] -C/w Pain control with Oxycodone IR 5 every 4 as needed moderate, Dilaudid 1 mg every 4 as needed severe   Metastatic Adenocarcinoma biliary tract diagnosed 05/30/2021 -Follows now at [facility] follow-up   Severe Constipation -Has not had a stool in over 3 weeks? 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 -LFTs could be also elevated in the setting of steroid demargination -LFT trend: Last  Labs      Recent Labs  Lab 10/14/22 0410 10/20/22 0853  AST 39 46*  ALT 36 89*    -Follow-up right upper quadrant ultrasound -Continue to [place] and Trend and repeat CMP in the outpatient setting   AKI, improved -BUN/Cr Trend: Last Labs          Recent Labs  Lab 10/12/22 1103 10/14/22 0410 10/16/22 0451 10/18/22 0435 10/19/22 0437 10/20/22 0853  BUN 13 25* 32* 57* 74* 35*  CREATININE 0.75 0.88 0.81 1.25* 1.99* 0.74    -He was initiated on IV fluid hydration with normal saline at 100 MLS per hour which will now stop. -Given urinary retention Foley catheter was then placed and will do a trial of void in outpatient setting -Avoid Nephrotoxic Medications, Contrast Dyes, Hypotension and Dehydration to Ensure Adequate Renal Perfusion and will need to Renally Adjust Meds -Continue to Monitor and Trend Renal Function carefully and repeat CMP in the outpatient setting   Hypoalbuminemia -Patient's Albumin Level went from 3.1 -> 2.9 -Continue to Monitor and Trend and Repeat in the outpatient setting    Anterior Chest/Scapular Pain, improving  -had CP relieved by GI cocktail on 10/15/22 -Started PPI with Pantoprazole  -No further Chest Pain    Thrombocytopenia -Patient's platelet count Trend: Last Labs         Recent Labs  Lab 10/12/22 1103 10/14/22 0410 10/15/22 0425 10/18/22 0435 10/20/22 0853  PLT 132* 143* 127* 152 139*    -Continue to Monitor for S/Sx of Bleeding; No overt bleeding noted -Repeat CBC in the AM    Hx of Pulmonary Embolism -Continue anticoagulation with Apixaban 5 twice daily   [place] Moderate Malnutrition in the Context of Chronic Illness -Nutrition Status: Nutrition Problem: Moderate Malnutrition Etiology: chronic illness (cholangiocarcinoma, metastatic to bone) Signs/Symptoms: moderate muscle depletion, moderate fat depletion Interventions: Ensure Enlive (each supplement provides 350kcal and 20 grams of protein), Refer to RD note for  recommendations     {Tip this will not be part of the note when signed Body mass index is 23.33 kg/m. ,  Nutrition Documentation    [facility] to Hosp-Admission (Current) from 10/12/2022 in [place]  Nutrition Problem Moderate Malnutrition  Etiology chronic illness  [cholangiocarcinoma, metastatic to bone]  Nutrition Goal Patient will meet greater than or equal to 90% of their needs  Interventions Ensure Enlive (each supplement provides 350kcal and 20 grams of protein), Refer to RD note for recommendations     ,  (Optional):26781}  {(NOTE) Pain control PDMP Statment (Optional):26782} Consultants: *** Procedures performed: ***  Disposition: {Plan; Disposition:26390} Diet recommendation:  Discharge Diet Orders (From admission, onward)     Start     Ordered   10/19/22 0000  Diet - low sodium heart healthy        10/19/22 1013           {Diet_Plan:26776} DISCHARGE MEDICATION: Allergies as of 10/21/2022   No Known Allergies   Med Rec must be completed prior to using this [place]***       Contact information for after-discharge care     Destination     [facility] .   Service: Skilled Nursing Contact information: [address] [place] [phone]                    Discharge Exam: Danley Danker Weights   10/14/22 1432 10/21/22 0506  Weight: 61.8 kg 63.6 kg   ***  Condition at discharge: {DC Condition:26389}  The results of significant diagnostics from this hospitalization (including imaging, microbiology, ancillary and laboratory) are listed below for reference.   Imaging Studies: US Abdomen Limited RUQ (LIVER/GB)  Result Date: 10/21/2022 CLINICAL DATA:  Abnormal LFTs EXAM: ULTRASOUND ABDOMEN LIMITED RIGHT UPPER QUADRANT COMPARISON:  CT 10/26/2021 FINDINGS: Gallbladder: Not visualized.  Reported prior cholecystectomy. Common bile duct: Common bile duct is not  visualized. Liver: There are heterogeneous nodular lesions with indistinct borders, measuring 3.4 x 2.0 x 3.6 cm and 2.9 x 1.9 x 2.7 cm. These likely correspond to the measured lesions on recent chest CT. Portal vein is patent on color Doppler imaging with normal direction of blood flow towards the liver. Other: None. IMPRESSION: Heterogeneous nodular lesions in the liver measuring up to 3.6 cm and 2.9 cm, suspicious for metastatic disease, as seen on recent chest CT. Gallbladder is not visualized.  Reported prior cholecystectomy. Common bile duct is not visualized. Electronically Signed   By: Maurine Simmering M.D.   On: 10/21/2022 08:11   MR THORACIC SPINE W WO CONTRAST  Result Date: 10/13/2022 CLINICAL DATA:  Osseous metastatic disease EXAM: MRI CERVICAL, THORACIC AND LUMBAR SPINE WITHOUT AND WITH CONTRAST TECHNIQUE: Multiplanar and multiecho pulse sequences of the cervical spine, to include the craniocervical junction and cervicothoracic junction, and thoracic and lumbar spine, were obtained without and with intravenous contrast. CONTRAST:  35m GADAVIST GADOBUTROL 1 MMOL/ML IV SOLN COMPARISON:  None available FINDINGS: MRI CERVICAL SPINE FINDINGS Alignment: Normal Vertebrae: No fracture, evidence of discitis, or bone lesion. Cord: Normal signal and morphology. Posterior Fossa, vertebral arteries, paraspinal tissues: Negative Disc levels: C1-2: Unremarkable. C2-3: Small central disc protrusion. There is no spinal canal stenosis. No neural foraminal stenosis. C3-4: Normal disc space and facet joints. There is no spinal canal stenosis. No neural foraminal stenosis. C4-5: Normal disc space and facet joints. There is no spinal canal stenosis. No neural foraminal stenosis. C5-6: Small disc bulge. There is no spinal canal stenosis. Mild left neural foraminal stenosis. C6-7: Normal disc space and facet joints. There is no spinal canal stenosis. No neural foraminal stenosis. C7-T1: Normal disc space and facet joints. There  is no spinal canal stenosis. No neural foraminal stenosis. MRI THORACIC SPINE FINDINGS Alignment:  Physiologic. Vertebrae: There are multiple expansile, contrast-enhancing masses. There are circumferential lesions at T2 and T3 that encroach on the spinal canal, particularly at right T3. There is a similar lesion of T6 that also encroaches on the spinal canal and causes severe stenosis of the thecal sac. Mild T4 compression deformity. Edema at T4 has resolved. Cord:  Normal signal and morphology. Paraspinal and other soft tissues: Pulmonary nodules are better assessed on the earlier CT. Disc levels: The thecal sac is effaced at the T2, T3 and T6 levels. There is moderate mass effect on the spinal cord at the T6 level. MRI LUMBAR SPINE FINDINGS Segmentation:  Standard. Alignment:  Normal Vertebrae: No fracture, evidence of discitis, or bone lesion. L1 hemangioma. Conus medullaris and cauda equina: Conus extends to the L1 level. Conus and cauda equina appear normal. Paraspinal and other soft tissues: Negative Disc levels: L1-L2: Normal disc space and  facet joints. No spinal canal stenosis. No neural foraminal stenosis. L2-L3: Normal disc space and facet joints. No spinal canal stenosis. No neural foraminal stenosis. L3-L4: Normal disc space and facet joints. No spinal canal stenosis. No neural foraminal stenosis. L4-L5: Left asymmetric disc bulge with narrowing of left lateral recess and posterior displacement of the left L5 nerve root. No central spinal canal stenosis. No neural foraminal stenosis. L5-S1: Left asymmetric disc bulge with mild left lateral recess narrowing and posterior displacement of the left S1 nerve root. No spinal canal stenosis. No neural foraminal stenosis. Visualized sacrum: Normal. IMPRESSION: 1. Multiple expansile, contrast-enhancing masses of the upper thoracic spine with epidural extension causing encroachment on the thecal sac. Moderate mass effect on the spinal cord at the T6 level 2. No  metastatic disease of the cervical or lumbar spine. 3. Left lateral recess narrowing at L4-5 and L5-S1 with posterior displacement of the left L5 and S1 nerve roots. 4. Resolution of edema at T4 without progression of height loss. Electronically Signed   By: Ulyses Jarred M.D.   On: 10/13/2022 00:20   MR Lumbar Spine W Wo Contrast  Result Date: 10/13/2022 CLINICAL DATA:  Osseous metastatic disease EXAM: MRI CERVICAL, THORACIC AND LUMBAR SPINE WITHOUT AND WITH CONTRAST TECHNIQUE: Multiplanar and multiecho pulse sequences of the cervical spine, to include the craniocervical junction and cervicothoracic junction, and thoracic and lumbar spine, were obtained without and with intravenous contrast. CONTRAST:  1m GADAVIST GADOBUTROL 1 MMOL/ML IV SOLN COMPARISON:  None available FINDINGS: MRI CERVICAL SPINE FINDINGS Alignment: Normal Vertebrae: No fracture, evidence of discitis, or bone lesion. Cord: Normal signal and morphology. Posterior Fossa, vertebral arteries, paraspinal tissues: Negative Disc levels: C1-2: Unremarkable. C2-3: Small central disc protrusion. There is no spinal canal stenosis. No neural foraminal stenosis. C3-4: Normal disc space and facet joints. There is no spinal canal stenosis. No neural foraminal stenosis. C4-5: Normal disc space and facet joints. There is no spinal canal stenosis. No neural foraminal stenosis. C5-6: Small disc bulge. There is no spinal canal stenosis. Mild left neural foraminal stenosis. C6-7: Normal disc space and facet joints. There is no spinal canal stenosis. No neural foraminal stenosis. C7-T1: Normal disc space and facet joints. There is no spinal canal stenosis. No neural foraminal stenosis. MRI THORACIC SPINE FINDINGS Alignment:  Physiologic. Vertebrae: There are multiple expansile, contrast-enhancing masses. There are circumferential lesions at T2 and T3 that encroach on the spinal canal, particularly at right T3. There is a similar lesion of T6 that also encroaches  on the spinal canal and causes severe stenosis of the thecal sac. Mild T4 compression deformity. Edema at T4 has resolved. Cord:  Normal signal and morphology. Paraspinal and other soft tissues: Pulmonary nodules are better assessed on the earlier CT. Disc levels: The thecal sac is effaced at the T2, T3 and T6 levels. There is moderate mass effect on the spinal cord at the T6 level. MRI LUMBAR SPINE FINDINGS Segmentation:  Standard. Alignment:  Normal Vertebrae: No fracture, evidence of discitis, or bone lesion. L1 hemangioma. Conus medullaris and cauda equina: Conus extends to the L1 level. Conus and cauda equina appear normal. Paraspinal and other soft tissues: Negative Disc levels: L1-L2: Normal disc space and facet joints. No spinal canal stenosis. No neural foraminal stenosis. L2-L3: Normal disc space and facet joints. No spinal canal stenosis. No neural foraminal stenosis. L3-L4: Normal disc space and facet joints. No spinal canal stenosis. No neural foraminal stenosis. L4-L5: Left asymmetric disc bulge with narrowing of left  lateral recess and posterior displacement of the left L5 nerve root. No central spinal canal stenosis. No neural foraminal stenosis. L5-S1: Left asymmetric disc bulge with mild left lateral recess narrowing and posterior displacement of the left S1 nerve root. No spinal canal stenosis. No neural foraminal stenosis. Visualized sacrum: Normal. IMPRESSION: 1. Multiple expansile, contrast-enhancing masses of the upper thoracic spine with epidural extension causing encroachment on the thecal sac. Moderate mass effect on the spinal cord at the T6 level 2. No metastatic disease of the cervical or lumbar spine. 3. Left lateral recess narrowing at L4-5 and L5-S1 with posterior displacement of the left L5 and S1 nerve roots. 4. Resolution of edema at T4 without progression of height loss. Electronically Signed   By: Ulyses Jarred M.D.   On: 10/13/2022 00:20   MR CERVICAL SPINE W WO  CONTRAST  Result Date: 10/13/2022 CLINICAL DATA:  Osseous metastatic disease EXAM: MRI CERVICAL, THORACIC AND LUMBAR SPINE WITHOUT AND WITH CONTRAST TECHNIQUE: Multiplanar and multiecho pulse sequences of the cervical spine, to include the craniocervical junction and cervicothoracic junction, and thoracic and lumbar spine, were obtained without and with intravenous contrast. CONTRAST:  38m GADAVIST GADOBUTROL 1 MMOL/ML IV SOLN COMPARISON:  None available FINDINGS: MRI CERVICAL SPINE FINDINGS Alignment: Normal Vertebrae: No fracture, evidence of discitis, or bone lesion. Cord: Normal signal and morphology. Posterior Fossa, vertebral arteries, paraspinal tissues: Negative Disc levels: C1-2: Unremarkable. C2-3: Small central disc protrusion. There is no spinal canal stenosis. No neural foraminal stenosis. C3-4: Normal disc space and facet joints. There is no spinal canal stenosis. No neural foraminal stenosis. C4-5: Normal disc space and facet joints. There is no spinal canal stenosis. No neural foraminal stenosis. C5-6: Small disc bulge. There is no spinal canal stenosis. Mild left neural foraminal stenosis. C6-7: Normal disc space and facet joints. There is no spinal canal stenosis. No neural foraminal stenosis. C7-T1: Normal disc space and facet joints. There is no spinal canal stenosis. No neural foraminal stenosis. MRI THORACIC SPINE FINDINGS Alignment:  Physiologic. Vertebrae: There are multiple expansile, contrast-enhancing masses. There are circumferential lesions at T2 and T3 that encroach on the spinal canal, particularly at right T3. There is a similar lesion of T6 that also encroaches on the spinal canal and causes severe stenosis of the thecal sac. Mild T4 compression deformity. Edema at T4 has resolved. Cord:  Normal signal and morphology. Paraspinal and other soft tissues: Pulmonary nodules are better assessed on the earlier CT. Disc levels: The thecal sac is effaced at the T2, T3 and T6 levels. There  is moderate mass effect on the spinal cord at the T6 level. MRI LUMBAR SPINE FINDINGS Segmentation:  Standard. Alignment:  Normal Vertebrae: No fracture, evidence of discitis, or bone lesion. L1 hemangioma. Conus medullaris and cauda equina: Conus extends to the L1 level. Conus and cauda equina appear normal. Paraspinal and other soft tissues: Negative Disc levels: L1-L2: Normal disc space and facet joints. No spinal canal stenosis. No neural foraminal stenosis. L2-L3: Normal disc space and facet joints. No spinal canal stenosis. No neural foraminal stenosis. L3-L4: Normal disc space and facet joints. No spinal canal stenosis. No neural foraminal stenosis. L4-L5: Left asymmetric disc bulge with narrowing of left lateral recess and posterior displacement of the left L5 nerve root. No central spinal canal stenosis. No neural foraminal stenosis. L5-S1: Left asymmetric disc bulge with mild left lateral recess narrowing and posterior displacement of the left S1 nerve root. No spinal canal stenosis. No neural foraminal stenosis. Visualized  sacrum: Normal. IMPRESSION: 1. Multiple expansile, contrast-enhancing masses of the upper thoracic spine with epidural extension causing encroachment on the thecal sac. Moderate mass effect on the spinal cord at the T6 level 2. No metastatic disease of the cervical or lumbar spine. 3. Left lateral recess narrowing at L4-5 and L5-S1 with posterior displacement of the left L5 and S1 nerve roots. 4. Resolution of edema at T4 without progression of height loss. Electronically Signed   By: Ulyses Jarred M.D.   On: 10/13/2022 00:20   CT Angio Chest PE W/Cm &/Or Wo Cm  Result Date: 10/12/2022 CLINICAL DATA:  History of cholangiocarcinoma with right-sided chest pain radiating into the back EXAM: CT ANGIOGRAPHY CHEST WITH CONTRAST TECHNIQUE: Multidetector CT imaging of the chest was performed using the standard protocol during bolus administration of intravenous contrast. Multiplanar CT image  reconstructions and MIPs were obtained to evaluate the vascular anatomy. RADIATION DOSE REDUCTION: This exam was performed according to the departmental dose-optimization program which includes automated exposure control, adjustment of the mA and/or kV according to patient size and/or use of iterative reconstruction technique. CONTRAST:  76m OMNIPAQUE IOHEXOL 350 MG/ML SOLN COMPARISON:  CT chest dated 10/26/2021 FINDINGS: Cardiovascular: The study is high quality for the evaluation of pulmonary embolism. There are no filling defects in the central, lobar, segmental or subsegmental pulmonary artery branches to suggest acute pulmonary embolism. Ascending aorta measures 4.1 cm, unchanged. Right chest wall port terminates at the superior cavoatrial junction. Normal heart size. No significant pericardial fluid/thickening. Coronary artery calcifications and aortic atherosclerosis. Mediastinum/Nodes: Imaged thyroid gland without nodules meeting criteria for imaging follow-up by size. Normal esophagus. Unchanged 10 mm right hilar lymph node (5:62). Lungs/Pleura: The central airways are patent. Scattered pulmonary nodules are new or slightly increased in size, for example left upper lobe 6 x 5 mm nodule (6:29), previously 2 mm, subpleural left upper lobe nodule measuring 5 x 4 mm (6:67), previously 2 mm, and subpleural 4 x 4 mm right lower lobe nodule (6:60). New 2 mm subpleural left upper lobe ground-glass nodule (6:36) and 3 mm left lower lobe nodule (6:89). Unchanged left lower lobe 3 mm perifissural nodule (6:74). No focal consolidation. No pneumothorax. No pleural effusion. Upper abdomen: Increased atrophic appearance of the right hepatic lobe. Additional multifocal hypoattenuating foci measuring up to 3.4 cm in segment 2/3 (5:128) are new. Musculoskeletal: Lytic and sclerotic appearance of T2 and T6 and right transverse process of T3 are new. Similar compression deformity of T4 with increased lytic appearance. Review  of the MIP images confirms the above findings. IMPRESSION: 1. No acute pulmonary embolism. 2. New and increased size of scattered pulmonary nodules, suspicious for metastatic disease. 3. New multifocal hypoattenuating foci in the liver, suspicious for metastatic disease. 4. New lytic and sclerotic appearance of T2 and T6 and right transverse process of T3, suspicious for osseous metastatic disease. Similar compression deformity of T4 with increased lytic appearance. 5. Coronary artery calcifications. Aortic Atherosclerosis (ICD10-I70.0). Electronically Signed   By: LDarrin NipperM.D.   On: 10/12/2022 14:21   DG Chest 2 View  Result Date: 10/12/2022 CLINICAL DATA:  Chest pain. EXAM: CHEST - 2 VIEW COMPARISON:  May 13, 2021. FINDINGS: The heart size and mediastinal contours are within normal limits. Both lungs are clear. Right internal jugular Port-A-Cath is noted with distal tip in expected position of the SVC. The visualized skeletal structures are unremarkable. IMPRESSION: No active cardiopulmonary disease. Electronically Signed   By: JMarijo ConceptionM.D.   On:  10/12/2022 11:48    Microbiology: Results for orders placed or performed during the hospital encounter of 05/13/21  Resp Panel by RT-PCR (Flu A&B, Covid) Nasopharyngeal Swab     Status: None   Collection Time: 05/13/21  3:40 PM   Specimen: Nasopharyngeal Swab; Nasopharyngeal(NP) swabs in vial transport medium  Result Value Ref Range Status   SARS Coronavirus 2 by RT PCR NEGATIVE NEGATIVE Final    Comment: (NOTE) SARS-CoV-2 target nucleic acids are NOT DETECTED.  The SARS-CoV-2 RNA is generally detectable in upper respiratory specimens during the acute phase of infection. The lowest concentration of SARS-CoV-2 viral copies this assay can detect is 138 copies/mL. A negative result does not preclude SARS-Cov-2 infection and should not be used as the sole basis for treatment or other patient management decisions. A negative result may occur  with  improper specimen collection/handling, submission of specimen other than nasopharyngeal swab, presence of viral mutation(s) within the areas targeted by this assay, and inadequate number of viral copies(<138 copies/mL). A negative result must be combined with clinical observations, patient history, and epidemiological information. The expected result is Negative.  Fact Sheet for Patients:  EntrepreneurPulse.com.au  Fact Sheet for Healthcare Providers:  IncredibleEmployment.be  This test is no t yet approved or cleared by the [place] FDA and  has been authorized for detection and/or diagnosis of SARS-CoV-2 by FDA under an Emergency Use Authorization (EUA). This EUA will remain  in effect (meaning this test can be used) for the duration of the COVID-19 declaration under Section 564(b)(1) of the Act, 21 U.S.C.section 360bbb-3(b)(1), unless the authorization is terminated  or revoked sooner.       Influenza A by PCR NEGATIVE NEGATIVE Final   Influenza B by PCR NEGATIVE NEGATIVE Final    Comment: (NOTE) The Xpert Xpress SARS-CoV-2/FLU/RSV plus assay is intended as an aid in the diagnosis of influenza from Nasopharyngeal swab specimens and should not be used as a sole basis for treatment. Nasal washings and aspirates are unacceptable for Xpert Xpress SARS-CoV-2/FLU/RSV testing.  Fact Sheet for Patients: EntrepreneurPulse.com.au  Fact Sheet for Healthcare Providers: IncredibleEmployment.be  This test is not yet approved or cleared by the [place] FDA and has been authorized for detection and/or diagnosis of SARS-CoV-2 by FDA under an Emergency Use Authorization (EUA). This EUA will remain in effect (meaning this test can be used) for the duration of the COVID-19 declaration under Section 564(b)(1) of the Act, 21 U.S.C. section 360bbb-3(b)(1), unless the authorization is terminated  or revoked.  Performed at [facility], [address], [place], [place] [address]     Labs: CBC: Recent Labs  Lab 10/15/22 0425 10/18/22 0435 10/20/22 0853  WBC 7.5 6.8 8.4  NEUTROABS  --  5.5 7.2  HGB 12.0* 13.4 13.8  HCT 36.8* 40.4 40.8  MCV 101.1* 98.3 97.4  PLT 127* 152 947*   Basic Metabolic Panel: Recent Labs  Lab 10/16/22 0451 10/18/22 0435 10/19/22 0437 10/20/22 0853  NA 141 137 137 137  K 4.7 4.9 5.5* 4.6  CL 107 104 106 106  CO2 '28 26 24 25  '$ GLUCOSE 134* 146* 143* 193*  BUN 32* 57* 74* 35*  CREATININE 0.81 1.25* 1.99* 0.74  CALCIUM 8.9 8.6* 8.1* 8.4*  MG  --   --   --  2.4  PHOS  --   --   --  4.2   Liver Function Tests: Recent Labs  Lab 10/20/22 0853  AST 46*  ALT 89*  ALKPHOS 213*  BILITOT 0.6  PROT 6.2*  ALBUMIN 2.9*   CBG: No results for input(s): "GLUCAP" in the last 168 hours.  Discharge time spent: {LESS THAN/GREATER TXMI:68032} 30 minutes.  Signed: Kerney Elbe, DO [facility] 10/21/2022

## 2022-10-21 NOTE — Progress Notes (Signed)
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  He states his weakness in the lower extremities has been significant over the past 3 to 4 weeks causing him unable to move his legs or walk.  He denies any abdominal pain, nausea or vomiting.  He denies any vision changes or headaches.  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  (progressive metastatic disease at T4 and T6 with compressive lesions, nonsurgical, has started radiation treatment)    PT Comments    Pt agreeable to mobilize.  Pt assisted to sitting EOB and required bil UE assist to self support.  Noticed small BM on bed pad and pt concerned with radiation time once sitting EOB and requested return to supine.  PRAFOs and SCDs in place upon return to supine.  RN notified. *Pt will need to start practicing transfer however no family has been present for education.  Continue to recommend post acute rehab prior to home upon d/c.    Recommendations for follow up therapy are one component of a multi-disciplinary discharge planning process, led by the attending physician.  Recommendations may be updated based on patient status, additional functional criteria and insurance authorization.  Follow Up Recommendations  Acute inpatient rehab (3hours/day) (if not AIR, then SNF)     Assistance Recommended at Discharge Frequent or constant Supervision/Assistance  Patient can return home with the following A lot of help with walking and/or transfers;A lot of help with  bathing/dressing/bathroom   Equipment Recommendations  Hospital bed    Recommendations for Other Services       Precautions / Restrictions Precautions Precautions: Fall;Back Precaution Comments: Thoracic lesions, paraplegia     Mobility  Bed Mobility Overal bed mobility: Needs Assistance Bed Mobility: Rolling, Sidelying to Sit, Sit to Sidelying Rolling: Mod assist, +2 for physical assistance Sidelying to sit: Max assist, +2 for physical assistance     Sit to sidelying: Max assist, +2 for physical assistance General bed mobility comments: assist for lower body, pt initiated reaching for rails to self assist with upper body; requiring increased assist for upper and lower body sidelying <-> sit due to pain and lower body weakness/flaccid    Transfers                   General transfer comment: pt worried about radiation time and requested to return to supine    Ambulation/Gait                   Stairs             Wheelchair Mobility    Modified Rankin (Stroke Patients Only)       Balance Overall balance assessment: Needs assistance Sitting-balance support: Bilateral upper extremity supported, Feet supported Sitting balance-Leahy Scale: Poor Sitting balance - Comments: reliant on Bil UE support, requires assist/support of trunk with only one UE and with transitioning                                    Cognition Arousal/Alertness: Awake/alert Behavior During  Therapy: Flat affect Overall Cognitive Status: Difficult to assess                                 General Comments: pt able to communicate with minimal basic English        Exercises      General Comments        Pertinent Vitals/Pain Pain Assessment Pain Assessment: 0-10 Pain Score: 7  Pain Location: across T6-7 area Pain Descriptors / Indicators: Grimacing, Sharp Pain Intervention(s): Repositioned, Monitored during session    Home Living                           Prior Function            PT Goals (current goals can now be found in the care plan section) Progress towards PT goals: Progressing toward goals    Frequency    Min 3X/week      PT Plan Current plan remains appropriate    Co-evaluation              AM-PAC PT "6 Clicks" Mobility   Outcome Measure  Help needed turning from your back to your side while in a flat bed without using bedrails?: A Lot Help needed moving from lying on your back to sitting on the side of a flat bed without using bedrails?: Total Help needed moving to and from a bed to a chair (including a wheelchair)?: Total Help needed standing up from a chair using your arms (e.g., wheelchair or bedside chair)?: Total Help needed to walk in hospital room?: Total Help needed climbing 3-5 steps with a railing? : Total 6 Click Score: 7    End of Session   Activity Tolerance: Patient limited by pain Patient left: in bed;with call bell/phone within reach Nurse Communication: Mobility status PT Visit Diagnosis: Other abnormalities of gait and mobility (R26.89);Other symptoms and signs involving the nervous system (R29.898)     Time: 9702-6378 PT Time Calculation (min) (ACUTE ONLY): 13 min  Charges:  $Therapeutic Activity: 8-22 mins                    Jannette Spanner PT, DPT Physical Therapist Acute Rehabilitation Services Preferred contact method: Secure Chat Weekend Pager Only: [phone] Office: [place] 10/21/2022, 2:50 PM

## 2022-10-21 NOTE — Plan of Care (Signed)
  Problem: Clinical Measurements: Goal: Respiratory complications will improve Outcome: Progressing   Problem: Elimination: Goal: Will not experience complications related to bowel motility Outcome: Progressing   Problem: Pain Managment: Goal: General experience of comfort will improve Outcome: Progressing

## 2022-10-21 NOTE — Care Management Important Message (Signed)
Important Message  Patient Details  Name: Jason Lyons MRN: [id number] Date of Birth: 04-08-1956   Medicare Important Message Given:  Yes     Memory Argue 10/21/2022, 11:21 AM

## 2022-10-21 NOTE — TOC Progression Note (Signed)
Transition of Care [facility]) - Progression Note    Patient Details  Name: Jason Lyons MRN: [id number] Date of Birth: 11/09/1955  Transition of Care [facility]) CM/SW [place], LCSW Phone Number: 10/21/2022, 1:04 PM  Clinical Narrative:     CSW reached out to [place] with [facility] regarding pt's daily radiation treatment. Per Loree Fee, the facility is unable to transport pt to his daily treatment. Per MD pt will remain in the hospital until treatment is finished. TOC to follow    Expected Discharge Plan: [place] Barriers to Discharge: No Barriers Identified  Expected Discharge Plan and Services In-house Referral: Clinical Social Work     Living arrangements for the past 2 months: Single Family Home Expected Discharge Date: 10/19/22                                     Social Determinants of Health (SDOH) Interventions SDOH Screenings   Food Insecurity: No Food Insecurity (10/14/2022)  Housing: Low Risk  (10/14/2022)  Transportation Needs: No Transportation Needs (10/14/2022)  Utilities: Not At Risk (10/14/2022)  Tobacco Use: Medium Risk (10/14/2022)    Readmission Risk Interventions     No data to display

## 2022-10-22 ENCOUNTER — Ambulatory Visit: Payer: Medicare Other

## 2022-10-22 ENCOUNTER — Other Ambulatory Visit: Payer: Self-pay

## 2022-10-22 ENCOUNTER — Ambulatory Visit
Admit: 2022-10-22 | Discharge: 2022-10-22 | Disposition: A | Discharge status: Home / Self Care | Payer: Medicare Other | Attending: Radiation Oncology | Admitting: Radiation Oncology

## 2022-10-22 DIAGNOSIS — G8220 Paraplegia, unspecified: Secondary | ICD-10-CM | POA: Diagnosis not present

## 2022-10-22 DIAGNOSIS — R29898 Other symptoms and signs involving the musculoskeletal system: Secondary | ICD-10-CM | POA: Diagnosis not present

## 2022-10-22 DIAGNOSIS — C249 Malignant neoplasm of biliary tract, unspecified: Secondary | ICD-10-CM | POA: Diagnosis not present

## 2022-10-22 DIAGNOSIS — R160 Hepatomegaly, not elsewhere classified: Secondary | ICD-10-CM | POA: Diagnosis not present

## 2022-10-22 LAB — RAD ONC ARIA SESSION SUMMARY
Course Elapsed Days: 8
Plan Fractions Treated to Date: 1
Plan Fractions Treated to Date: 7
Plan Prescribed Dose Per Fraction: 3 Gy
Plan Prescribed Dose Per Fraction: 3 Gy
Plan Total Fractions Prescribed: 10
Plan Total Fractions Prescribed: 4
Plan Total Prescribed Dose: 12 Gy
Plan Total Prescribed Dose: 30 Gy
Reference Point Dosage Given to Date: 21 Gy
Reference Point Dosage Given to Date: 3 Gy
Reference Point Session Dosage Given: 3 Gy
Reference Point Session Dosage Given: 3 Gy
Session Number: 7

## 2022-10-22 MED ORDER — ENSURE ENLIVE PO LIQD
237.0000 mL | Freq: Three times a day (TID) | ORAL | Status: DC
  Administered 2022-10-22 – 2022-10-28 (×16): 237 mL via ORAL

## 2022-10-22 NOTE — Progress Notes (Signed)
Nutrition Follow-up  DOCUMENTATION CODES:   Non-severe (moderate) malnutrition in context of chronic illness  INTERVENTION:  - Continue Regular diet. - Automatic house trays to ensure he receives 3 meals a day. - Increase Ensure Plus High Protein po to TID, each supplement provides 350 kcal and 20 grams of protein.             - Patient prefers chocolate. - Encourage intake at all meals and of supplements.  - Monitor weight trends.   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 *ongoing  GOAL:   Patient will meet greater than or equal to 90% of their needs *progressing, patient ordered supplements  MONITOR:   PO intake, Supplement acceptance, Weight trends  REASON FOR ASSESSMENT:   Malnutrition Screening Tool    ASSESSMENT:   67 year old [place] speaking only gentleman with history of cholangiocarcinoma, metastatic to bone, who has been on chemotherapy every 3 weeks for at least the past 2 years. He has had prior mets to T4 spine which was treated with radiation therapy. 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  Used interpreter to speak with patient. He reports he has been eating 3 meals a day. Appetite is good. Notes he has been trying to drink Ensure. Per RN documentation patient eating an average of 38% of meals and frequently drinking Ensure but not at each scheduled time. Discussed importance of trying to eat well and of drinking Ensure to avoid weight loss during admission. Patient endorsed understanding.    Medications reviewed and include: Decadron, Senokot  Labs reviewed:  -   Diet Order:   Diet Order             Diet regular Room service appropriate? Yes; Fluid consistency: Thin  Diet effective now           Diet - low sodium heart healthy                    EDUCATION NEEDS:  Education needs have been addressed  Skin:  Skin Assessment: Reviewed RN Assessment  Last BM:  1/12  Height:  Ht Readings from Last 1 Encounters:  10/14/22 '5\' 5"'$  (1.651 m)   Weight:  Wt Readings from Last 1 Encounters:  10/21/22 63.6 kg    BMI:  Body mass index is 23.33 kg/m.  Estimated Nutritional Needs:  Kcal:  1850-2000 kcals Protein:  80-95 grams Fluid:  >/= 1.8L    Samson Frederic RD, LDN For contact information, refer to [facility].

## 2022-10-22 NOTE — Progress Notes (Signed)
PROGRESS NOTE    Jason Lyons  POE:[id number] DOB: 06-21-1956 DOA: 10/12/2022 PCP: Pcp, No   Brief Narrative:  The patient is a 67 year old [place] male with a past medical history significant for but not limited to known adenocarcinoma of the biliary tract diagnosed in 05/13/2021 followed by Dr. Alvy Bimler on chemotherapy with cisplatin and gemcitabine but not currently managed at [facility] as he is on chemo there and history of known prior pulmonary embolism, pathological fracture T4 status post XRT in the past as well and other comorbidities was seen in the [facility] ED and 09/30/2019 because of pain in the back as well as numbness of bilateral hands and feet.  He was given oxycodone and was discharged home and was noted to not be able to pass stool for the last 3 weeks but still had control of his urine.  He is brought back to the ED on 10/12/2022 at [facility] with right-sided chest pain that radiated to the back and was found to have a T6 spinal metastasis as well as new scattered pulmonary nodules.  Neurosurgery was consulted and did not feel the patient was an operative candidate and he was given Decadron and transferred over to [facility] for consideration of radiation therapy which would be palliative.  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  He remains on steroids and pain is improving and he is getting radiation treatments.  Also did not had a really good bowel movement so had been initiated on Nulytely bowel regimen.  Now having bowel movements and is improved significantly.  He is medically stable for discharge unfortunately cannot be discharged until he completes his radiation treatments given that the SNF cannot arrange daily transport back and forth to the radiation center.   Today he rates his pain a 6/10.   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 Verlon Au Discussed with patient's former oncologist Dr. Alvy Bimler and she hadnothing further to add as he needs follow-up at [facility] -C/w Pain control with Oxycodone IR 5 every 4 as needed moderate, Dilaudid 1 mg every 4 as needed severe -Pain is 6/10 today    Metastatic Adenocarcinoma biliary tract diagnosed 05/30/2021 -Follows now at [place] follow-up when Discharged from SNF   Severe Constipation -Reported that he had not had a stool in over 3 weeks? 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 Gallbladder is not visualized.  Reported prior cholecystectomy. Common bile duct is not visualized." -Continue to [place] and Trend and repeat CMP in the outpatient setting   AKI, improved -BUN/Cr Trend: Recent  Labs  Lab 10/12/22 1103 10/14/22 0410 10/16/22 0451 10/18/22 0435 10/19/22 0437 10/20/22 0853  BUN 13 25* 32* 57* 74* 35*  CREATININE 0.75 0.88 0.81 1.25* 1.99* 0.74  -He was initiated on IV fluid hydration with normal saline at 100 MLS per hour which will now stop. -Given urinary retention Foley catheter was then placed and will do a trial of void in outpatient setting -Avoid Nephrotoxic Medications, Contrast Dyes, Hypotension and Dehydration to Ensure Adequate Renal Perfusion and will need to Renally Adjust Meds -Continue to Monitor and Trend Renal Function carefully and repeat CMP in the outpatient setting   Hypoalbuminemia -Patient's Albumin Level went from 3.1 -> 2.9 om last check  -Continue to Monitor and Trend and Repeat in a few days    Anterior Chest/Scapular Pain, improving  -had CP relieved by GI cocktail on 10/15/22 -Started PPI with Pantoprazole  -No further Chest Pain    Thrombocytopenia -Patient's platelet count Trend: Recent Labs  Lab 10/12/22 1103 10/14/22 0410 10/15/22 0425 10/18/22 0435 10/20/22 0853  PLT 132* 143* 127* 152 139*  -Continue to Monitor for S/Sx of Bleeding; No overt bleeding noted -Repeat CBC in a few days   Hx of Pulmonary Embolism -Continue anticoagulation with Apixaban 5 twice daily   [place] Moderate Malnutrition in the Context of Chronic Illness -Nutrition Status: Nutrition Problem: Moderate Malnutrition Etiology: chronic illness (cholangiocarcinoma, metastatic to bone) Signs/Symptoms: moderate muscle depletion, moderate fat depletion Interventions: Ensure Enlive (each supplement provides 350kcal and 20 grams of protein), Refer to RD note for recommendations  DVT prophylaxis: SCDs Start: 10/13/22 0536 apixaban (ELIQUIS) tablet 5 mg    Code Status: Full Code Family Communication: No family present at bedside   Disposition Plan:  Level of care: Telemetry Status is: Inpatient Remains inpatient appropriate because: He is  medically stable for discharge at this time however culture cannot be discharged due to inability to be transported back and forth from SNF to the radiation center daily; last day of radiation is 10/27/2022      Consultants:  Radiation oncology Medical oncology Neurosurgery    Procedures:  As delineated as above  Antimicrobials:  Anti-infectives (From admission, onward)    None      Subjective: Seen and examined at bedside with the assistance of the vitamins translator Raul Del [phone] and the patient states that he is feeling about the same and thinks his pain is mildly better and is 6 out of 10 in severity.  Continues to have bowel movements and states he has not had a bowel movement today but had some last night.  No nausea or vomiting.  Feels okay.  No other concerns or complaints this time and understands that he needs to remain in the hospital his radiation is complete.  Objective: Vitals:   10/21/22 2035 10/21/22 2036 10/22/22 0546 10/22/22 0820  BP: 104/63 104/63 100/67 136/70  Pulse: 67 68 64 79  Resp: '18 18 12 16  '$ Temp: 97.9 F (36.6 C) 97.9 F (36.6 C) 98.4 F (36.9 C) 98.7 F (37.1 C)  TempSrc: Oral Oral Oral Oral  SpO2: 99% 99%  98%  Weight:      Height:  Intake/Output Summary (Last 24 hours) at 10/22/2022 1225 Last data filed at 10/22/2022 1000 Gross per 24 hour  Intake 270 ml  Output 1675 ml  Net -1405 ml   Filed Weights   10/14/22 1432 10/21/22 0506  Weight: 61.8 kg 63.6 kg   Examination: Physical Exam:  Constitutional: Chronically ill-appearing [place] male in no acute distress Respiratory: Diminished to auscultation bilaterally, no wheezing, rales, rhonchi or crackles. Normal respiratory effort and patient is not tachypenic. No accessory muscle use.  Unlabored breathing Cardiovascular: RRR, no murmurs / rubs / gallops. S1 and S2 auscultated. No extremity edema.  Abdomen: Soft, non-tender, non-distended. Bowel sounds positive.  GU:  Deferred. Musculoskeletal: No clubbing / cyanosis of digits/nails. No joint deformity upper and lower extremities.  Skin: No rashes, lesions, ulcers on limited skin evaluation. No induration; Warm and dry.  Neurologic: CN 2-12 grossly intact with no focal deficits but cannot really use his legs. Romberg sign cerebellar reflexes not assessed.  Psychiatric: Normal judgment and insight. Alert and oriented x 3. Normal mood and appropriate affect.   Data Reviewed: I have personally reviewed following labs and imaging studies  CBC: Recent Labs  Lab 10/18/22 0435 10/20/22 0853  WBC 6.8 8.4  NEUTROABS 5.5 7.2  HGB 13.4 13.8  HCT 40.4 40.8  MCV 98.3 97.4  PLT 152 637*   Basic Metabolic Panel: Recent Labs  Lab 10/16/22 0451 10/18/22 0435 10/19/22 0437 10/20/22 0853  NA 141 137 137 137  K 4.7 4.9 5.5* 4.6  CL 107 104 106 106  CO2 '28 26 24 25  '$ GLUCOSE 134* 146* 143* 193*  BUN 32* 57* 74* 35*  CREATININE 0.81 1.25* 1.99* 0.74  CALCIUM 8.9 8.6* 8.1* 8.4*  MG  --   --   --  2.4  PHOS  --   --   --  4.2   GFR: Estimated Creatinine Clearance: 76.9 mL/min (by C-G formula based on SCr of 0.74 mg/dL). Liver Function Tests: Recent Labs  Lab 10/20/22 0853  AST 46*  ALT 89*  ALKPHOS 213*  BILITOT 0.6  PROT 6.2*  ALBUMIN 2.9*   No results for input(s): "LIPASE", "AMYLASE" in the last 168 hours. No results for input(s): "AMMONIA" in the last 168 hours. Coagulation Profile: No results for input(s): "INR", "PROTIME" in the last 168 hours. Cardiac Enzymes: No results for input(s): "CKTOTAL", "CKMB", "CKMBINDEX", "TROPONINI" in the last 168 hours. BNP (last 3 results) No results for input(s): "PROBNP" in the last 8760 hours. HbA1C: No results for input(s): "HGBA1C" in the last 72 hours. CBG: No results for input(s): "GLUCAP" in the last 168 hours. Lipid Profile: No results for input(s): "CHOL", "HDL", "LDLCALC", "TRIG", "CHOLHDL", "LDLDIRECT" in the last 72 hours. Thyroid Function  Tests: No results for input(s): "TSH", "T4TOTAL", "FREET4", "T3FREE", "THYROIDAB" in the last 72 hours. Anemia Panel: No results for input(s): "VITAMINB12", "FOLATE", "FERRITIN", "TIBC", "IRON", "RETICCTPCT" in the last 72 hours. Sepsis Labs: No results for input(s): "PROCALCITON", "LATICACIDVEN" in the last 168 hours.  No results found for this or any previous visit (from the past 240 hour(s)).   Radiology Studies: US Abdomen Limited RUQ (LIVER/GB)  Result Date: 10/21/2022 CLINICAL DATA:  Abnormal LFTs EXAM: ULTRASOUND ABDOMEN LIMITED RIGHT UPPER QUADRANT COMPARISON:  CT 10/26/2021 FINDINGS: Gallbladder: Not visualized.  Reported prior cholecystectomy. Common bile duct: Common bile duct is not visualized. Liver: There are heterogeneous nodular lesions with indistinct borders, measuring 3.4 x 2.0 x 3.6 cm and 2.9 x 1.9 x 2.7 cm. These likely correspond  to the measured lesions on recent chest CT. Portal vein is patent on color Doppler imaging with normal direction of blood flow towards the liver. Other: None. IMPRESSION: Heterogeneous nodular lesions in the liver measuring up to 3.6 cm and 2.9 cm, suspicious for metastatic disease, as seen on recent chest CT. Gallbladder is not visualized.  Reported prior cholecystectomy. Common bile duct is not visualized. Electronically Signed   By: Maurine Simmering M.D.   On: 10/21/2022 08:11    Scheduled Meds:  apixaban  5 mg Oral BID   Chlorhexidine Gluconate Cloth  6 each Topical Daily   dexamethasone  4 mg Oral Q6H   feeding supplement  237 mL Oral BID BM   pantoprazole  40 mg Oral Daily   senna  1 tablet Oral BID   Continuous Infusions:   LOS: 9 days   Raiford Noble, DO [facility] Available via Epic secure chat 7am-7pm After these hours, please refer to coverage provider listed on [email or web address] 10/22/2022, 12:25 PM

## 2022-10-23 DIAGNOSIS — R079 Chest pain, unspecified: Secondary | ICD-10-CM | POA: Diagnosis not present

## 2022-10-23 DIAGNOSIS — C249 Malignant neoplasm of biliary tract, unspecified: Secondary | ICD-10-CM | POA: Diagnosis not present

## 2022-10-23 DIAGNOSIS — R160 Hepatomegaly, not elsewhere classified: Secondary | ICD-10-CM | POA: Diagnosis not present

## 2022-10-23 DIAGNOSIS — G8220 Paraplegia, unspecified: Secondary | ICD-10-CM | POA: Diagnosis not present

## 2022-10-23 LAB — TROPONIN I (HIGH SENSITIVITY)
Troponin I (High Sensitivity): 5 ng/L (ref <18)
Troponin I (High Sensitivity): 5 ng/L (ref <18)

## 2022-10-23 MED ORDER — NITROGLYCERIN 0.4 MG SL SUBL
0.4000 mg | SUBLINGUAL_TABLET | SUBLINGUAL | Status: DC | PRN
  Administered 2022-10-23 – 2022-10-24 (×3): 0.4 mg via SUBLINGUAL
  Filled 2022-10-23 (×4): qty 1

## 2022-10-23 NOTE — Progress Notes (Signed)
   10/23/22 1310  Vitals  Temp 97.7 F (36.5 C)  Temp Source Oral  BP 125/77  MAP (mmHg) 91  BP Location Left Arm  BP Method Automatic  Patient Position (if appropriate) Sitting  Pulse Rate 65  Pulse Rate Source Monitor  ECG Heart Rate 66  Resp 15  Level of Consciousness  Level of Consciousness Alert  MEWS COLOR  MEWS Score Color Green  Oxygen Therapy  SpO2 98 %  MEWS Score  MEWS Temp 0  MEWS Systolic 0  MEWS Pulse 0  MEWS RR 0  MEWS LOC 0  MEWS Score 0   STAT VS r/t c/o CP. Pt is alert & oriented x 4, rates pain @ 7/10 midsternal to back

## 2022-10-23 NOTE — Progress Notes (Signed)
[place] interpreter, [place] [phone]. Pt c/o CP. Will obtain VS, dilaudid & notify Dr. Alfredia Ferguson.

## 2022-10-23 NOTE — Progress Notes (Signed)
PROGRESS NOTE    Jason Lyons  HFW:[id number] DOB: 1956-07-01 DOA: 10/12/2022 PCP: Pcp, No   Brief Narrative:  The patient is a 67 year old [place] male with a past medical history significant for but not limited to known adenocarcinoma of the biliary tract diagnosed in 05/13/2021 followed by Dr. Alvy Bimler on chemotherapy with cisplatin and gemcitabine but not currently managed at [facility] as he is on chemo there and history of known prior pulmonary embolism, pathological fracture T4 status post XRT in the past as well and other comorbidities was seen in the [facility] ED and 09/30/2019 because of pain in the back as well as numbness of bilateral hands and feet.  He was given oxycodone and was discharged home and was noted to not be able to pass stool for the last 3 weeks but still had control of his urine.  He is brought back to the ED on 10/12/2022 at [facility] with right-sided chest pain that radiated to the back and was found to have a T6 spinal metastasis as well as new scattered pulmonary nodules.  Neurosurgery was consulted and did not feel the patient was an operative candidate and he was given Decadron and transferred over to [facility] for consideration of radiation therapy which would be palliative.  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  He remains on steroids and pain is improving and he is getting radiation treatments.  Also did not had a really good bowel movement so had been initiated on Nulytely bowel regimen.  Now having bowel movements and is improved significantly.  He is medically stable for discharge unfortunately cannot be discharged until he completes his radiation treatments given that the SNF cannot arrange daily transport back and forth to the radiation center.   Yesterday he rated his pain a 6/10 but today it was a 7/10 and states it started in the Left side of his chest and radiated to his back.    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 Verlon Au Discussed with patient's former oncologist Dr. Alvy Bimler and she hadnothing further to add as he needs follow-up at [facility] -C/w Pain control with Oxycodone IR 5 every 4 as needed moderate, Dilaudid 1 mg every 4 as needed severe -Pain is 7/10 today   Chest Pain r/o ACS -States he had Left Sided Chest Pain that radiated around his side to his back -Troponin x 2 Negative and Flat as Troponin I was 5 x2 -EKG done and showed NSR at a rate of 69 with no evidence of ST Elevation or Depression and a Qtc of 390 -Trial of Nitroglycerin 0.4 mg SL    Metastatic Adenocarcinoma biliary tract diagnosed 05/30/2021 -Follows now at [facility] follow-up when Discharged from SNF   Severe Constipation -Reported that he had not had a stool in over 3 weeks? 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 Gallbladder is not visualized.  Reported prior cholecystectomy. Common bile duct is not visualized." -Continue to [place] and Trend and repeat CMP in the outpatient setting   AKI, improved -BUN/Cr Trend: Recent Labs  Lab 10/12/22 1103 10/14/22 0410 10/16/22 0451 10/18/22 0435 10/19/22 0437 10/20/22 0853  BUN 13 25* 32* 57* 74* 35*  CREATININE 0.75 0.88 0.81 1.25* 1.99* 0.74  -He was initiated on IV fluid hydration with normal saline at 100 MLS per hour which will now stop. -Given urinary retention Foley catheter was then placed and will do a trial of void in outpatient setting -Avoid Nephrotoxic Medications, Contrast Dyes, Hypotension and Dehydration to Ensure Adequate Renal Perfusion and will need to Renally Adjust Meds -Continue to Monitor and Trend Renal Function carefully and repeat CMP in the outpatient setting   Hypoalbuminemia -Patient's Albumin Level went from 3.1 -> 2.9 om last check  -Continue to Monitor and Trend and Repeat in a few days    Anterior Chest/Scapular Pain, improving  -had CP relieved by GI cocktail on 10/15/22 -Started PPI with Pantoprazole  -No further Chest Pain    Thrombocytopenia -Patient's platelet count Trend: Recent Labs  Lab 10/12/22 1103 10/14/22 0410 10/15/22 0425 10/18/22 0435 10/20/22 0853  PLT 132* 143* 127* 152 139*  -Continue to Monitor for S/Sx of Bleeding; No overt bleeding noted -Repeat CBC in a few days   Hx of Pulmonary Embolism -Continue anticoagulation with Apixaban 5 twice daily   Nonsevere Moderate Malnutrition in the Context of Chronic Illness -Nutrition Status: Nutrition Problem: Moderate Malnutrition Etiology: chronic illness (cholangiocarcinoma, metastatic to bone) Signs/Symptoms: moderate muscle depletion, moderate fat  depletion Interventions: Ensure Enlive (each supplement provides 350kcal and 20 grams of protein), Refer to RD note for recommendations  DVT prophylaxis: SCDs Start: 10/13/22 0536 apixaban (ELIQUIS) tablet 5 mg    Code Status: Full Code Family Communication: No family present at bedside   Disposition Plan:  Level of care: Telemetry Status is: Inpatient Remains inpatient appropriate because:  He is medically stable for discharge at this time however culture cannot be discharged due to inability to be transported back and forth from SNF to the radiation center daily; last day of radiation is 10/27/2022      Consultants:  Radiation oncology Medical oncology Neurosurgery  Procedures:  As delineated as above   Antimicrobials:  Anti-infectives (From admission, onward)    None       Subjective: Seen and examined at bedside today with the assistant of the [place] translator Hien [phone] and the patient was complaining of pain still and he states that he is now only having chest pain that is left-sided that radiates to his back and it was a 7 out of 10 in severity.  He denies any lightheadedness or dizziness or shortness of breath.  No diaphoresis.  Still having bowel movements.  Asking for pain medicine.  No  other concerns or complaints at this time.  Objective: Vitals:   10/22/22 2100 10/23/22 0614 10/23/22 1308 10/23/22 1310  BP: 118/82 117/70  125/77  Pulse: 71 67  65  Resp: '19 12 17 15  '$ Temp: 98.2 F (36.8 C) 98.4 F (36.9 C)  97.7 F (36.5 C)  TempSrc: Oral Oral  Oral  SpO2: 97% 98%  98%  Weight:      Height:        Intake/Output Summary (Last 24 hours) at 10/23/2022 1550 Last data filed at 10/23/2022 1410 Gross per 24 hour  Intake 240 ml  Output 1800 ml  Net -1560 ml   Filed Weights   10/14/22 1432 10/21/22 0506  Weight: 61.8 kg 63.6 kg   Examination: Physical Exam:  Constitutional: Chronically ill-appearing [place] male in no acute distress Respiratory:  Diminished to auscultation bilaterally with coarse breath sounds, no wheezing, rales, rhonchi or crackles. Normal respiratory effort and patient is not tachypenic. No accessory muscle use.  Unlabored breathing Cardiovascular: RRR, no murmurs / rubs / gallops. S1 and S2 auscultated.  Chest wall pain on palpation Abdomen: Soft, non-tender, non-distended. Bowel sounds positive.  GU: Deferred. Musculoskeletal: No clubbing / cyanosis of digits/nails. No joint deformity upper and lower extremities.  Skin: No rashes, lesions, ulcers on limited skin evaluation. No induration; Warm and dry.  Neurologic: CN 2-12 grossly intact with no focal deficits. Romberg sign and cerebellar reflexes not assessed.  Psychiatric: Normal judgment and insight. Alert and oriented x 3. Normal mood and appropriate affect.   Data Reviewed: I have personally reviewed following labs and imaging studies  CBC: Recent Labs  Lab 10/18/22 0435 10/20/22 0853  WBC 6.8 8.4  NEUTROABS 5.5 7.2  HGB 13.4 13.8  HCT 40.4 40.8  MCV 98.3 97.4  PLT 152 119*   Basic Metabolic Panel: Recent Labs  Lab 10/18/22 0435 10/19/22 0437 10/20/22 0853  NA 137 137 137  K 4.9 5.5* 4.6  CL 104 106 106  CO2 '26 24 25  '$ GLUCOSE 146* 143* 193*  BUN 57* 74* 35*  CREATININE 1.25* 1.99* 0.74  CALCIUM 8.6* 8.1* 8.4*  MG  --   --  2.4  PHOS  --   --  4.2   GFR: Estimated Creatinine Clearance: 76.9 mL/min (by C-G formula based on SCr of 0.74 mg/dL). Liver Function Tests: Recent Labs  Lab 10/20/22 0853  AST 46*  ALT 89*  ALKPHOS 213*  BILITOT 0.6  PROT 6.2*  ALBUMIN 2.9*   No results for input(s): "LIPASE", "AMYLASE" in the last 168 hours. No results for input(s): "AMMONIA" in the last 168 hours. Coagulation Profile: No results for input(s): "INR", "PROTIME" in the last 168 hours. Cardiac Enzymes: No results for input(s): "CKTOTAL", "CKMB", "CKMBINDEX", "TROPONINI" in the last 168 hours. BNP (last 3 results) No results for input(s):  "PROBNP" in the last 8760 hours. HbA1C: No results for input(s): "HGBA1C" in the last 72 hours. CBG: No results for input(s): "GLUCAP" in the last 168 hours. Lipid Profile: No results for input(s): "CHOL", "HDL", "LDLCALC", "TRIG", "CHOLHDL", "LDLDIRECT" in the last 72 hours. Thyroid Function Tests: No results for input(s): "TSH", "T4TOTAL", "FREET4", "T3FREE", "THYROIDAB" in the last 72 hours. Anemia Panel: No results for input(s): "VITAMINB12", "FOLATE", "FERRITIN", "TIBC", "IRON", "RETICCTPCT" in the last 72 hours. Sepsis Labs: No results for input(s): "PROCALCITON", "LATICACIDVEN" in the last 168 hours.  No results found for this or any previous visit (from the past 240 hour(s)).   Radiology Studies: No results found.  Scheduled Meds:  apixaban  5 mg Oral BID   Chlorhexidine Gluconate Cloth  6 each Topical Daily   dexamethasone  4 mg Oral Q6H   feeding supplement  237 mL Oral TID BM   pantoprazole  40 mg Oral Daily   senna  1 tablet Oral BID   Continuous Infusions:   LOS: 10 days   Raiford Noble, DO [facility] Available via Epic secure chat 7am-7pm After these hours, please refer to coverage provider listed on [email or web address] 10/23/2022, 3:50 PM

## 2022-10-24 DIAGNOSIS — C249 Malignant neoplasm of biliary tract, unspecified: Secondary | ICD-10-CM | POA: Diagnosis not present

## 2022-10-24 DIAGNOSIS — R079 Chest pain, unspecified: Secondary | ICD-10-CM | POA: Diagnosis not present

## 2022-10-24 DIAGNOSIS — G8220 Paraplegia, unspecified: Secondary | ICD-10-CM | POA: Diagnosis not present

## 2022-10-24 DIAGNOSIS — R160 Hepatomegaly, not elsewhere classified: Secondary | ICD-10-CM | POA: Diagnosis not present

## 2022-10-24 LAB — MAGNESIUM: Magnesium: 2.1 mg/dL (ref 1.7–2.4)

## 2022-10-24 LAB — CBC WITH DIFFERENTIAL/PLATELET
Abs Immature Granulocytes: 0.49 10*3/uL — ABNORMAL HIGH (ref 0.00–0.07)
Basophils Absolute: 0.1 10*3/uL (ref 0.0–0.1)
Basophils Relative: 1 %
Eosinophils Absolute: 0 10*3/uL (ref 0.0–0.5)
Eosinophils Relative: 0 %
HCT: 38.2 % — ABNORMAL LOW (ref 39.0–52.0)
Hemoglobin: 12.8 g/dL — ABNORMAL LOW (ref 13.0–17.0)
Immature Granulocytes: 3 %
Lymphocytes Relative: 2 %
Lymphs Abs: 0.4 10*3/uL — ABNORMAL LOW (ref 0.7–4.0)
MCH: 32.5 pg (ref 26.0–34.0)
MCHC: 33.5 g/dL (ref 30.0–36.0)
MCV: 97 fL (ref 80.0–100.0)
Monocytes Absolute: 0.9 10*3/uL (ref 0.1–1.0)
Monocytes Relative: 6 %
Neutro Abs: 13.8 10*3/uL — ABNORMAL HIGH (ref 1.7–7.7)
Neutrophils Relative %: 88 %
Platelets: 119 10*3/uL — ABNORMAL LOW (ref 150–400)
RBC: 3.94 MIL/uL — ABNORMAL LOW (ref 4.22–5.81)
RDW: 15 % (ref 11.5–15.5)
WBC: 15.6 10*3/uL — ABNORMAL HIGH (ref 4.0–10.5)
nRBC: 0.3 % — ABNORMAL HIGH (ref 0.0–0.2)

## 2022-10-24 LAB — COMPREHENSIVE METABOLIC PANEL
ALT: 112 U/L — ABNORMAL HIGH (ref 0–44)
AST: 50 U/L — ABNORMAL HIGH (ref 15–41)
Albumin: 2.6 g/dL — ABNORMAL LOW (ref 3.5–5.0)
Alkaline Phosphatase: 183 U/L — ABNORMAL HIGH (ref 38–126)
Anion gap: 6 (ref 5–15)
BUN: 32 mg/dL — ABNORMAL HIGH (ref 8–23)
CO2: 27 mmol/L (ref 22–32)
Calcium: 8.9 mg/dL (ref 8.9–10.3)
Chloride: 104 mmol/L (ref 98–111)
Creatinine, Ser: 0.56 mg/dL — ABNORMAL LOW (ref 0.61–1.24)
GFR, Estimated: >60 mL/min (ref >60)
Glucose, Bld: 134 mg/dL — ABNORMAL HIGH (ref 70–99)
Potassium: 4.4 mmol/L (ref 3.5–5.1)
Sodium: 137 mmol/L (ref 135–145)
Total Bilirubin: 0.3 mg/dL (ref 0.3–1.2)
Total Protein: 5.8 g/dL — ABNORMAL LOW (ref 6.5–8.1)

## 2022-10-24 LAB — PHOSPHORUS: Phosphorus: 3 mg/dL (ref 2.5–4.6)

## 2022-10-24 LAB — GLUCOSE, CAPILLARY: Glucose-Capillary: 185 mg/dL — ABNORMAL HIGH (ref 70–99)

## 2022-10-24 NOTE — Progress Notes (Signed)
Foley removed, peri care, provided urinal.

## 2022-10-24 NOTE — Progress Notes (Signed)
PROGRESS NOTE    Jason Lyons  LFY:[id number] DOB: [birth year] DOA: 10/12/2022 PCP: Pcp, No   Brief Narrative:  The patient is a 67 year old [place] male with a past medical history significant for but not limited to known adenocarcinoma of the biliary tract diagnosed in 05/13/2021 followed by Dr. Alvy Bimler on chemotherapy with cisplatin and gemcitabine but not currently managed at [facility] as he is on chemo there and history of known prior pulmonary embolism, pathological fracture T4 status post XRT in the past as well and other comorbidities was seen in the [facility] ED and 09/30/2019 because of pain in the back as well as numbness of bilateral hands and feet.  He was given oxycodone and was discharged home and was noted to not be able to pass stool for the last 3 weeks but still had control of his urine.  He is brought back to the ED on 10/12/2022 at [facility] with right-sided chest pain that radiated to the back and was found to have a T6 spinal metastasis as well as new scattered pulmonary nodules.  Neurosurgery was consulted and did not feel the patient was an operative candidate and he was given Decadron and transferred over to [facility] for consideration of radiation therapy which would be palliative.  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  He remains on steroids and pain is improving and he is getting radiation treatments.  Also did not had a really good bowel movement so had been initiated on Nulytely bowel regimen.  Now having bowel movements and is improved significantly.  He is medically stable for discharge unfortunately cannot be discharged until he completes his radiation treatments given that the SNF cannot arrange daily transport back and forth to the radiation center.   Patient continues to still have pain and continues to have some chest discomfort that radiates to the back stable but states it is a 6 out  of 10 today as opposed to a 7 out of 10.  Will try a trial of void and bladder scan every 8 hours today.  WBC was obtained and it was elevated but likely in the setting of steroid margination.  Currently remains afebrile and about the same as yesterday.   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 Verlon Au Discussed with patient's former oncologist Dr. Alvy Bimler and she hadnothing further to add as he needs follow-up at [facility] -C/w Pain control with Oxycodone IR 5 every 4 as needed moderate, Dilaudid 1 mg every 4 as needed severe -Pain is 6/10 today     Metastatic Adenocarcinoma biliary tract diagnosed 05/30/2021 -Follows now at [place] follow-up when Discharged from SNF   Severe Constipation, improving  -Reported that he had not had a stool in over 3 weeks? 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 Gallbladder is not visualized.  Reported prior cholecystectomy. Common bile duct is not visualized." -Continue to Monitor and Trend and repeat CMP in the outpatient setting  Leukocytosis -WBC Trend: Recent Labs  Lab 10/12/22 1103 10/14/22 0410 10/15/22 0425 10/18/22 0435 10/20/22 0853 10/24/22 0450  WBC 5.6 7.0 7.5 6.8 8.4 15.6*  -In the setting of Steroid Demargination from Decadron as he is a febrile and has no S/S of Infection -Will discontinue Foley catheter as we do not want him to get a urinary tract infection and do a trial of void -Continue to monitor and trend and repeat CBC in a few days  Acute Urinary Retention -Had to have Foley catheter placed we will trial a trial of void today and discontinue Foley and bladder scan every 8h   AKI, improved -BUN/Cr Trend: Recent Labs  Lab 10/12/22 1103 10/14/22 0410 10/16/22 0451 10/18/22 0435 10/19/22 0437 10/20/22 0853 10/24/22 0450  BUN 13 25* 32* 57* 74* 35* 32*  CREATININE 0.75 0.88 0.81 1.25* 1.99* 0.74 0.56*  -He was initiated on IV fluid hydration with normal saline at 100 MLS per hour which will now stop. -Given urinary retention Foley catheter was then placed and will do a trial of void today as above -Avoid Nephrotoxic Medications, Contrast Dyes, Hypotension and Dehydration to Ensure Adequate Renal Perfusion and will need to Renally Adjust Meds -Continue to Monitor and Trend Renal Function carefully and repeat CMP in the outpatient setting   Hypoalbuminemia -Patient's Albumin Level went from 3.1 -> 2.9 -> 2.6 -Continue to Monitor and Trend and Repeat in a few days    Anterior Chest/Scapular Pain, improving  -had CP relieved by GI cocktail on 10/15/22 -Started PPI with Pantoprazole  -Having intermittent chest pain and he had Left Sided Chest  Pain that radiated around his side to his back -Troponin x 2 Negative and Flat as Troponin I was 5 x2 -EKG done and showed NSR at a rate of 69 with no evidence of ST Elevation or Depression and a Qtc of 390 -Trial of Nitroglycerin 0.4 mg SL   Thrombocytopenia -Patient's platelet count Trend: Recent Labs  Lab 10/12/22 1103 10/14/22 0410 10/15/22 0425 10/18/22 0435 10/20/22 0853 10/24/22 0450  PLT 132* 143* 127* 152 139* 119*  -Continue to Monitor for S/Sx of Bleeding; No overt bleeding noted -Repeat CBC in a few days   Hx of Pulmonary Embolism -Continue anticoagulation with Apixaban 5 twice daily   [place]onsevere Moderate Malnutrition in the Context of Chronic Illness -Nutrition Status: Nutrition Problem: Moderate Malnutrition Etiology: chronic illness (cholangiocarcinoma, metastatic to bone) Signs/Symptoms: moderate muscle depletion, moderate fat depletion Interventions: Ensure Enlive (each supplement provides 350kcal and 20 grams of protein), Refer to RD note for recommendations   DVT prophylaxis: SCDs Start: 10/13/22 0536 apixaban (ELIQUIS) tablet 5 mg    Code Status: Full Code Family Communication: No family currently at bedside  Disposition Plan:  Level of care:  Telemetry Status is: Inpatient Remains inpatient appropriate because: He remains medically stable for discharge and awaiting for his radiation therapies to complete prior to discharging to SNF   Consultants:  Radiation oncology Medical oncology Neurosurgery  Procedures:  As delineated as above  Antimicrobials:  Anti-infectives (From admission, onward)    None       Subjective: Seen and examined at bedside with the assistance of the [place] translator Quynh Vo [phone] and he was doing okay and still complaining some pain but states it was a 6 out of 10.  States he did not have a bowel movement yesterday or today.  Denies any nausea or vomiting.  No other concerns or complaints at this time but thinks  he did not get his IV pain medication to help him today.  Discussed with him about trial of void and he is agreeable.  Objective: Vitals:   10/24/22 0200 10/24/22 0300 10/24/22 0400 10/24/22 0553  BP:    122/76  Pulse:    (!) 58  Resp: '13 10 13 10  '$ Temp:    98.1 F (36.7 C)  TempSrc:    Oral  SpO2:    98%  Weight:      Height:        Intake/Output Summary (Last 24 hours) at 10/24/2022 0920 Last data filed at 10/24/2022 6160 Gross per 24 hour  Intake --  Output 1650 ml  Net -1650 ml   Filed Weights   10/14/22 1432 10/21/22 0506  Weight: 61.8 kg 63.6 kg   Examination: Physical Exam:  Constitutional: Chronically ill-appearing [place] male in no acute distress Respiratory: Diminished to auscultation bilaterally, no wheezing, rales, rhonchi or crackles. Normal respiratory effort and patient is not tachypenic. No accessory muscle use.  Unlabored breathing Cardiovascular: RRR, no murmurs / rubs / gallops. S1 and S2 auscultated. Abdomen: Soft, non-tender, non-distended. Bowel sounds positive.  GU: Deferred. Musculoskeletal: No clubbing / cyanosis of digits/nails. No joint deformity upper and lower extremities.  Skin: No rashes, lesions, ulcers on limited skin evaluation. No induration; Warm and dry.  Neurologic: CN 2-12 grossly intact with no focal deficits. Romberg sign and cerebellar reflexes not assessed.  Psychiatric: Normal judgment and insight. Alert and oriented x 3. Normal mood and appropriate affect.   Data Reviewed: I have personally reviewed following labs and imaging studies  CBC: Recent Labs  Lab 10/18/22 0435 10/20/22 0853 10/24/22 0450  WBC 6.8 8.4 15.6*  NEUTROABS 5.5 7.2 13.8*  HGB 13.4 13.8 12.8*  HCT 40.4 40.8 38.2*  MCV 98.3 97.4 97.0  PLT 152 139* 737*   Basic Metabolic Panel: Recent Labs  Lab 10/18/22 0435 10/19/22 0437 10/20/22 0853 10/24/22 0450  NA 137 137 137 137  K 4.9 5.5* 4.6 4.4  CL 104 106 106 104  CO2 '26 24 25 27  '$ GLUCOSE 146*  143* 193* 134*  BUN 57* 74* 35* 32*  CREATININE 1.25* 1.99* 0.74 0.56*  CALCIUM 8.6* 8.1* 8.4* 8.9  MG  --   --  2.4 2.1  PHOS  --   --  4.2 3.0   GFR: Estimated Creatinine Clearance: 76.9 mL/min (A) (by C-G formula based on SCr of 0.56 mg/dL (L)). Liver Function Tests: Recent Labs  Lab 10/20/22 0853 10/24/22 0450  AST 46* 50*  ALT 89* 112*  ALKPHOS 213* 183*  BILITOT 0.6 0.3  PROT 6.2* 5.8*  ALBUMIN 2.9* 2.6*   No results for input(s): "LIPASE", "AMYLASE" in the last 168 hours. No results for input(s): "AMMONIA"  in the last 168 hours. Coagulation Profile: No results for input(s): "INR", "PROTIME" in the last 168 hours. Cardiac Enzymes: No results for input(s): "CKTOTAL", "CKMB", "CKMBINDEX", "TROPONINI" in the last 168 hours. BNP (last 3 results) No results for input(s): "PROBNP" in the last 8760 hours. HbA1C: No results for input(s): "HGBA1C" in the last 72 hours. CBG: No results for input(s): "GLUCAP" in the last 168 hours. Lipid Profile: No results for input(s): "CHOL", "HDL", "LDLCALC", "TRIG", "CHOLHDL", "LDLDIRECT" in the last 72 hours. Thyroid Function Tests: No results for input(s): "TSH", "T4TOTAL", "FREET4", "T3FREE", "THYROIDAB" in the last 72 hours. Anemia Panel: No results for input(s): "VITAMINB12", "FOLATE", "FERRITIN", "TIBC", "IRON", "RETICCTPCT" in the last 72 hours. Sepsis Labs: No results for input(s): "PROCALCITON", "LATICACIDVEN" in the last 168 hours.  No results found for this or any previous visit (from the past 240 hour(s)).   Radiology Studies: No results found.  Scheduled Meds:  apixaban  5 mg Oral BID   Chlorhexidine Gluconate Cloth  6 each Topical Daily   dexamethasone  4 mg Oral Q6H   feeding supplement  237 mL Oral TID BM   pantoprazole  40 mg Oral Daily   senna  1 tablet Oral BID   Continuous Infusions:   LOS: 11 days   Raiford Noble, DO [facility] Available via Epic secure chat 7am-7pm After these hours, please  refer to coverage provider listed on [email or web address] 10/24/2022, 9:20 AM

## 2022-10-25 ENCOUNTER — Other Ambulatory Visit: Payer: Self-pay

## 2022-10-25 ENCOUNTER — Ambulatory Visit: Payer: Medicare Other

## 2022-10-25 ENCOUNTER — Encounter: Payer: Self-pay | Admitting: Radiation Oncology

## 2022-10-25 ENCOUNTER — Ambulatory Visit
Admit: 2022-10-25 | Discharge: 2022-10-25 | Disposition: A | Discharge status: Home / Self Care | Payer: Medicare Other | Attending: Radiation Oncology | Admitting: Radiation Oncology

## 2022-10-25 DIAGNOSIS — C249 Malignant neoplasm of biliary tract, unspecified: Secondary | ICD-10-CM | POA: Diagnosis not present

## 2022-10-25 DIAGNOSIS — R160 Hepatomegaly, not elsewhere classified: Secondary | ICD-10-CM | POA: Diagnosis not present

## 2022-10-25 DIAGNOSIS — R079 Chest pain, unspecified: Secondary | ICD-10-CM | POA: Diagnosis not present

## 2022-10-25 DIAGNOSIS — G8220 Paraplegia, unspecified: Secondary | ICD-10-CM | POA: Diagnosis not present

## 2022-10-25 LAB — MAGNESIUM: Magnesium: 2.2 mg/dL (ref 1.7–2.4)

## 2022-10-25 LAB — CBC WITH DIFFERENTIAL/PLATELET
Abs Immature Granulocytes: 0.79 10*3/uL — ABNORMAL HIGH (ref 0.00–0.07)
Basophils Absolute: 0.1 10*3/uL (ref 0.0–0.1)
Basophils Relative: 1 %
Eosinophils Absolute: 0 10*3/uL (ref 0.0–0.5)
Eosinophils Relative: 0 %
HCT: 39.4 % (ref 39.0–52.0)
Hemoglobin: 13.2 g/dL (ref 13.0–17.0)
Immature Granulocytes: 6 %
Lymphocytes Relative: 2 %
Lymphs Abs: 0.3 10*3/uL — ABNORMAL LOW (ref 0.7–4.0)
MCH: 32.8 pg (ref 26.0–34.0)
MCHC: 33.5 g/dL (ref 30.0–36.0)
MCV: 98 fL (ref 80.0–100.0)
Monocytes Absolute: 0.7 10*3/uL (ref 0.1–1.0)
Monocytes Relative: 5 %
Neutro Abs: 12.3 10*3/uL — ABNORMAL HIGH (ref 1.7–7.7)
Neutrophils Relative %: 86 %
Platelets: 120 10*3/uL — ABNORMAL LOW (ref 150–400)
RBC: 4.02 MIL/uL — ABNORMAL LOW (ref 4.22–5.81)
RDW: 15 % (ref 11.5–15.5)
WBC: 14.1 10*3/uL — ABNORMAL HIGH (ref 4.0–10.5)
nRBC: 0.6 % — ABNORMAL HIGH (ref 0.0–0.2)

## 2022-10-25 LAB — RAD ONC ARIA SESSION SUMMARY
Course Elapsed Days: 11
Plan Fractions Treated to Date: 2
Plan Fractions Treated to Date: 8
Plan Prescribed Dose Per Fraction: 3 Gy
Plan Prescribed Dose Per Fraction: 3 Gy
Plan Total Fractions Prescribed: 10
Plan Total Fractions Prescribed: 4
Plan Total Prescribed Dose: 12 Gy
Plan Total Prescribed Dose: 30 Gy
Reference Point Dosage Given to Date: 24 Gy
Reference Point Dosage Given to Date: 6 Gy
Reference Point Session Dosage Given: 3 Gy
Reference Point Session Dosage Given: 3 Gy
Session Number: 8

## 2022-10-25 LAB — COMPREHENSIVE METABOLIC PANEL
ALT: 137 U/L — ABNORMAL HIGH (ref 0–44)
AST: 56 U/L — ABNORMAL HIGH (ref 15–41)
Albumin: 2.3 g/dL — ABNORMAL LOW (ref 3.5–5.0)
Alkaline Phosphatase: 167 U/L — ABNORMAL HIGH (ref 38–126)
Anion gap: 6 (ref 5–15)
BUN: 29 mg/dL — ABNORMAL HIGH (ref 8–23)
CO2: 27 mmol/L (ref 22–32)
Calcium: 8.8 mg/dL — ABNORMAL LOW (ref 8.9–10.3)
Chloride: 104 mmol/L (ref 98–111)
Creatinine, Ser: 0.53 mg/dL — ABNORMAL LOW (ref 0.61–1.24)
GFR, Estimated: >60 mL/min (ref >60)
Glucose, Bld: 148 mg/dL — ABNORMAL HIGH (ref 70–99)
Potassium: 4.2 mmol/L (ref 3.5–5.1)
Sodium: 137 mmol/L (ref 135–145)
Total Bilirubin: 0.4 mg/dL (ref 0.3–1.2)
Total Protein: 5.3 g/dL — ABNORMAL LOW (ref 6.5–8.1)

## 2022-10-25 LAB — PHOSPHORUS: Phosphorus: 3.1 mg/dL (ref 2.5–4.6)

## 2022-10-25 NOTE — Progress Notes (Signed)
  Radiation Oncology         [phone]) [phone] ________________________________  Name: Jason Lyons  YYP:[id number]  Date of Service: 10/25/22  DOB: 1956-04-23   Steroid Taper Instructions   You currently have a prescription for Dexamethasone 4 mg Tablets.   Beginning 10/27/22  Take a 4 mg tablet twice a day  Beginning 11/03/22: Take 1/2 of a tablet (which is 2 mg) twice a day  Beginning 11/10/22: Take 1/2 of a tablet (which is 2 mg) once a day  Beginning 11/17/22: Take 1/2 of a tablet (which is 2 mg) every other day and stop on 11/22/22.   Please call our office if you have any headaches, visual changes, uncontrolled movements, extremity weakness, nausea or vomiting.

## 2022-10-25 NOTE — Progress Notes (Signed)
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  He states his weakness in the lower extremities has been significant over the past 3 to 4 weeks causing him unable to move his legs or walk.  He denies any abdominal pain, nausea or vomiting.  He denies any vision changes or headaches.  He has been undergoing systemic therapy at 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  (progressive metastatic disease at T4 and T6 with compressive lesions, nonsurgical, has started radiation treatment)    PT Comments    Pt assisted to sitting EOB and able to remain there for at least 5 minutes and work on sitting balance with varying UE support.  Pt pain appears better today then previous sessions.  Continue to recommend AIR upon d/c (and SNF if not able to d/c to AIR).    Recommendations for follow up therapy are one component of a multi-disciplinary discharge planning process, led by the attending physician.  Recommendations may be updated based on patient status, additional functional criteria and insurance authorization.  Follow Up Recommendations  Acute inpatient rehab (3hours/day) (if not AIR, then SNF)     Assistance Recommended at Discharge Frequent or constant Supervision/Assistance  Patient can return home with the following A lot of help with walking and/or transfers;A lot of help with bathing/dressing/bathroom   Equipment Recommendations  Hospital bed (hoyer lift)    Recommendations for Other Services       Precautions / Restrictions  Precautions Precautions: Fall;Back Precaution Comments: Thoracic lesions, paraplegia     Mobility  Bed Mobility Overal bed mobility: Needs Assistance Bed Mobility: Rolling, Sidelying to Sit, Sit to Sidelying Rolling: Mod assist, +2 for physical assistance Sidelying to sit: Mod assist, +2 for physical assistance     Sit to sidelying: Max assist, +2 for physical assistance General bed mobility comments: assist for lower body, pt initiated reaching for rails to self assist with upper body; requiring increased assist for upper and lower body sidelying <-> sit due to pain and lower body weakness/flaccid    Transfers                   General transfer comment: pt states "no, I can't" when asked to try scooting/transfer    Ambulation/Gait                   Stairs             Wheelchair Mobility    Modified Rankin (Stroke Patients Only)       Balance Overall balance assessment: Needs assistance Sitting-balance support: Bilateral upper extremity supported, Feet supported Sitting balance-Leahy Scale: Poor Sitting balance - Comments: reliant UE support, pt able to sit EOB for 5 minutes total before fatigue and pain became too much; pt worked on using on one UE support and no UE support trunk control but typically needing assist at trunk especially with no UE support                                    Cognition Arousal/Alertness: Awake/alert Behavior During Therapy:  Flat affect Overall Cognitive Status: Difficult to assess                                 General Comments: pt able to communicate with minimal basic English        Exercises      General Comments        Pertinent Vitals/Pain Pain Assessment Pain Assessment: 0-10 Pain Score: 6  Pain Location: across T6-7 area Pain Descriptors / Indicators: Grimacing, Sharp Pain Intervention(s): Repositioned, Monitored during session    Home Living                           Prior Function            PT Goals (current goals can now be found in the care plan section) Acute Rehab PT Goals PT Goal Formulation: With patient Time For Goal Achievement: 11/08/22 Potential to Achieve Goals: Fair Progress towards PT goals: Progressing toward goals    Frequency    Min 2X/week      PT Plan Current plan remains appropriate    Co-evaluation              AM-PAC PT "6 Clicks" Mobility   Outcome Measure  Help needed turning from your back to your side while in a flat bed without using bedrails?: A Lot Help needed moving from lying on your back to sitting on the side of a flat bed without using bedrails?: Total Help needed moving to and from a bed to a chair (including a wheelchair)?: Total Help needed standing up from a chair using your arms (e.g., wheelchair or bedside chair)?: Total Help needed to walk in hospital room?: Total Help needed climbing 3-5 steps with a railing? : Total 6 Click Score: 7    End of Session   Activity Tolerance: Patient limited by pain Patient left: in bed;with call bell/phone within reach   PT Visit Diagnosis: Other abnormalities of gait and mobility (R26.89);Other symptoms and signs involving the nervous system (R29.898)     Time: 1696-7893 PT Time Calculation (min) (ACUTE ONLY): 16 min  Charges:  $Therapeutic Activity: 8-22 mins                    Jannette Spanner PT, DPT Physical Therapist Acute Rehabilitation Services Preferred contact method: Secure Chat Weekend Pager Only: [phone] Office: [place] 10/25/2022, 2:38 PM

## 2022-10-25 NOTE — Progress Notes (Signed)
Bladder scanned pt and read 450 ml. NP Olena Heckle notified and received order to in and our cath pt. I and out cath pt with output of 600 ml. NP Olena Heckle made aware. Will continue to monitor pt.

## 2022-10-25 NOTE — Progress Notes (Signed)
Pt hasn't voided since last in and out cath. Bladder scanned pt and showed 343m~. Pt was in and out and got 450 ml urine output. Notified NP DOlena Heckle Will continue to monitor pt.

## 2022-10-25 NOTE — Progress Notes (Signed)
PROGRESS NOTE    Jason Lyons  CHY:[id number] DOB: 08-04-1956 DOA: 10/12/2022 PCP: Pcp, No   Brief Narrative:  The patient is a 67 year old [place] male with a past medical history significant for but not limited to known adenocarcinoma of the biliary tract diagnosed in 05/13/2021 followed by Dr. Alvy Bimler on chemotherapy with cisplatin and gemcitabine but not currently managed at [facility] as he is on chemo there and history of known prior pulmonary embolism, pathological fracture T4 status post XRT in the past as well and other comorbidities was seen in the [facility] ED and 09/30/2019 because of pain in the back as well as numbness of bilateral hands and feet.  He was given oxycodone and was discharged home and was noted to not be able to pass stool for the last 3 weeks but still had control of his urine.  He is brought back to the ED on 10/12/2022 at [facility] with right-sided chest pain that radiated to the back and was found to have a T6 spinal metastasis as well as new scattered pulmonary nodules.  Neurosurgery was consulted and did not feel the patient was an operative candidate and he was given Decadron and transferred over to [facility] for consideration of radiation therapy which would be palliative.  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  He remains on steroids and pain is improving and he is getting radiation treatments.  Also did not had a really good bowel movement so had been initiated on Nulytely bowel regimen.  Now having bowel movements and is improved significantly.  He is medically stable for discharge unfortunately cannot be discharged until he completes his radiation treatments given that the SNF cannot arrange daily transport back and forth to the radiation center.   Patient continues to still have pain and continues to have some chest discomfort that radiates to the back stable but states it is a 6 out  of 10 today as opposed to a 7 out of 10.  Will try a trial of void and bladder scan every 8 hours today.  WBC was obtained and it was elevated but likely in the setting of steroid margination.  Currently remains afebrile and about the same as yesterday.   TOV done and unfortunately did not pass as he was retaining so Foley Catheter to be reinserted.   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 Verlon Au Discussed with patient's former oncologist Dr. Alvy Bimler and she hadnothing further to add as he needs follow-up at [facility] -C/w Pain control with Oxycodone IR 5 every 4 as needed moderate, Dilaudid 1 mg every 4 as needed severe -Pain is 6/10 today again      Metastatic Adenocarcinoma biliary tract diagnosed 05/30/2021 -Follows now at [facility] follow-up when Discharged from SNF   Severe Constipation, improving  -Reported that he had not had a stool in over 3 weeks? 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 Gallbladder is not visualized.  Reported prior cholecystectomy. Common bile duct is not visualized." -Continue to Monitor and Trend and repeat CMP in the outpatient setting   Leukocytosis -WBC Trend: Recent Labs  Lab 10/12/22 1103 10/14/22 0410 10/15/22 0425 10/18/22 0435 10/20/22 0853 10/24/22 0450 10/25/22 0450  WBC 5.6 7.0 7.5 6.8 8.4 15.6* 14.1*  -In the setting of Steroid Demargination from Decadron as he is a febrile and has no S/S of Infection and is now improving; Patient is to start Decadron taper as outlined by Radiation Oncology -Will discontinue Foley catheter as we do not want him to get a urinary tract infection and do a trial of void -Continue to monitor and trend and repeat CBC in a few days   Acute Urinary Retention -Had to have Foley catheter placed we will trial a trial of void today and discontinue Foley and bladder scan every 8h but continued to retain so foley will be reinserted -Continue to Monitor and Trend    AKI, improved -BUN/Cr Trend: Recent Labs  Lab 10/14/22 0410 10/16/22 0451 10/18/22 0435 10/19/22 0437 10/20/22 0853 10/24/22 0450 10/25/22 0450  BUN 25* 32* 57* 74* 35* 32* 29*  CREATININE 0.88 0.81 1.25* 1.99* 0.74 0.56* 0.53*  -IVF now stopped  -Given urinary retention Foley catheter was then placed and will do a trial of void today as above -Avoid Nephrotoxic Medications, Contrast Dyes, Hypotension and Dehydration to Ensure Adequate Renal Perfusion and will need to Renally Adjust Meds -Continue to Monitor and Trend Renal Function carefully and repeat CMP in the outpatient setting   Hypoalbuminemia -Patient's Albumin Level Trend Recent Labs   Lab 10/14/22 0410 10/20/22 0853 10/24/22 0450 10/25/22 0450  ALBUMIN 3.1* 2.9* 2.6* 2.3*  -Continue to Monitor and Trend and Repeat in a few days    Anterior Chest/Scapular Pain, improving  -had CP relieved by GI cocktail on 10/15/22 -Started PPI with Pantoprazole  -Having intermittent chest pain and he had Left Sided Chest Pain that radiated around his side to his back -Troponin x 2 Negative and Flat as Troponin I was 5 x2 -EKG done and showed NSR at a rate of 69 with no evidence of ST Elevation or Depression and a Qtc of 390 -Trial of Nitroglycerin 0.4 mg SL    Thrombocytopenia -Patient's platelet count Trend: Recent Labs  Lab 10/12/22 1103 10/14/22 0410 10/15/22 0425 10/18/22 0435 10/20/22 0853 10/24/22 0450 10/25/22 0450  PLT 132* 143* 127* 152 139* 119* 120*  -Continue to Monitor for S/Sx of Bleeding; No overt bleeding noted -Repeat CBC in a few days   Hx of Pulmonary Embolism -Continue anticoagulation with Apixaban 5 twice daily   [place] Moderate Malnutrition in the Context of Chronic Illness -Nutrition Status: Nutrition Problem: Moderate Malnutrition Etiology: chronic illness (cholangiocarcinoma,  metastatic to bone) Signs/Symptoms: moderate muscle depletion, moderate fat depletion Interventions: Ensure Enlive (each supplement provides 350kcal and 20 grams of protein), Refer to RD note for recommendations  DVT prophylaxis: SCDs Start: 10/13/22 0536 apixaban (ELIQUIS) tablet 5 mg    Code Status: Full Code Family Communication: No family currently at bedside  Disposition Plan:  Level of care: Telemetry Status is: Inpatient Remains inpatient appropriate because: Remains medically stable for discharge however cannot be discharged until his radiation treatments are complete   Consultants:  Radiation oncology Medical oncology Neurosurgery  Procedures:  As delineated as above   Antimicrobials:  Anti-infectives (From admission, onward)    None        Subjective: Seen and examined at bedside with the assistance of the [place] translator Jeanine [phone] and the patient states he is doing okay and still having some pain which is 6 out of 10 in severity.  States that he is not really able to pee very much and only peed 50 to 60 cc at a time and bladder scan was done and showed that he is retaining so Foley catheter will be replaced.  Denies any lightheadedness or dizziness.  No other concerns or complaints at this time.  Objective: Vitals:   10/24/22 1409 10/24/22 2026 10/25/22 0100 10/25/22 0500  BP:      Pulse:  70    Resp:  18    Temp: (!) 97.5 F (36.4 C) 98.2 F (36.8 C) 97.8 F (36.6 C) 98.9 F (37.2 C)  TempSrc: Oral Oral Oral Oral  SpO2:  99%    Weight:    63.8 kg  Height:        Intake/Output Summary (Last 24 hours) at 10/25/2022 1513 Last data filed at 10/25/2022 0630 Gross per 24 hour  Intake 120 ml  Output 1050 ml  Net -930 ml   Filed Weights   10/14/22 1432 10/21/22 0506 10/25/22 0500  Weight: 61.8 kg 63.6 kg 63.8 kg   Examination: Physical Exam:  Constitutional: WN/WD [place] male in NAD appears calm Respiratory: Diminished to auscultation bilaterally, no wheezing, rales, rhonchi or crackles. Normal respiratory effort and patient is not tachypenic. No accessory muscle use. Unlabored breathing   Cardiovascular: RRR, no murmurs / rubs / gallops. S1 and S2 auscultated. No extremity edema. Abdomen: Soft, non-tender, non-distended. Bowel sounds positive.  GU: Deferred. Musculoskeletal: No clubbing / cyanosis of digits/nails. No joint deformity upper and lower extremities.  Skin: No rashes, lesions, ulcers. No induration; Warm and dry.  Neurologic: CN 2-12 grossly intact with no focal deficits. Romberg sign cerebellar reflexes not assessed.  Psychiatric: Normal judgment and insight. Alert and oriented x 3. Normal mood and appropriate affect.   Data Reviewed: I have personally reviewed following labs and  imaging studies  CBC: Recent Labs  Lab 10/20/22 0853 10/24/22 0450 10/25/22 0450  WBC 8.4 15.6* 14.1*  NEUTROABS 7.2 13.8* 12.3*  HGB 13.8 12.8* 13.2  HCT 40.8 38.2* 39.4  MCV 97.4 97.0 98.0  PLT 139* 119* 426*   Basic Metabolic Panel: Recent Labs  Lab 10/19/22 0437 10/20/22 0853 10/24/22 0450 10/25/22 0450  NA 137 137 137 137  K 5.5* 4.6 4.4 4.2  CL 106 106 104 104  CO2 '24 25 27 27  '$ GLUCOSE 143* 193* 134* 148*  BUN 74* 35* 32* 29*  CREATININE 1.99* 0.74 0.56* 0.53*  CALCIUM 8.1* 8.4* 8.9 8.8*  MG  --  2.4 2.1 2.2  PHOS  --  4.2 3.0 3.1   GFR:  Estimated Creatinine Clearance: 76.9 mL/min (A) (by C-G formula based on SCr of 0.53 mg/dL (L)). Liver Function Tests: Recent Labs  Lab 10/20/22 0853 10/24/22 0450 10/25/22 0450  AST 46* 50* 56*  ALT 89* 112* 137*  ALKPHOS 213* 183* 167*  BILITOT 0.6 0.3 0.4  PROT 6.2* 5.8* 5.3*  ALBUMIN 2.9* 2.6* 2.3*   No results for input(s): "LIPASE", "AMYLASE" in the last 168 hours. No results for input(s): "AMMONIA" in the last 168 hours. Coagulation Profile: No results for input(s): "INR", "PROTIME" in the last 168 hours. Cardiac Enzymes: No results for input(s): "CKTOTAL", "CKMB", "CKMBINDEX", "TROPONINI" in the last 168 hours. BNP (last 3 results) No results for input(s): "PROBNP" in the last 8760 hours. HbA1C: No results for input(s): "HGBA1C" in the last 72 hours. CBG: Recent Labs  Lab 10/24/22 1653  GLUCAP 185*   Lipid Profile: No results for input(s): "CHOL", "HDL", "LDLCALC", "TRIG", "CHOLHDL", "LDLDIRECT" in the last 72 hours. Thyroid Function Tests: No results for input(s): "TSH", "T4TOTAL", "FREET4", "T3FREE", "THYROIDAB" in the last 72 hours. Anemia Panel: No results for input(s): "VITAMINB12", "FOLATE", "FERRITIN", "TIBC", "IRON", "RETICCTPCT" in the last 72 hours. Sepsis Labs: No results for input(s): "PROCALCITON", "LATICACIDVEN" in the last 168 hours.  No results found for this or any previous visit  (from the past 240 hour(s)).   Radiology Studies: No results found.  Scheduled Meds:  apixaban  5 mg Oral BID   Chlorhexidine Gluconate Cloth  6 each Topical Daily   dexamethasone  4 mg Oral Q6H   feeding supplement  237 mL Oral TID BM   pantoprazole  40 mg Oral Daily   senna  1 tablet Oral BID   Continuous Infusions:   LOS: 12 days   Raiford Noble, DO [facility] Available via Epic secure chat 7am-7pm After these hours, please refer to coverage provider listed on [email or web address] 10/25/2022, 3:13 PM

## 2022-10-26 ENCOUNTER — Ambulatory Visit
Admit: 2022-10-26 | Discharge: 2022-10-26 | Disposition: A | Discharge status: Home / Self Care | Payer: Medicare Other | Attending: Radiation Oncology | Admitting: Radiation Oncology

## 2022-10-26 ENCOUNTER — Other Ambulatory Visit: Payer: Self-pay

## 2022-10-26 ENCOUNTER — Ambulatory Visit: Payer: Medicare Other

## 2022-10-26 DIAGNOSIS — R160 Hepatomegaly, not elsewhere classified: Secondary | ICD-10-CM | POA: Diagnosis not present

## 2022-10-26 DIAGNOSIS — G8220 Paraplegia, unspecified: Secondary | ICD-10-CM | POA: Diagnosis not present

## 2022-10-26 DIAGNOSIS — R079 Chest pain, unspecified: Secondary | ICD-10-CM | POA: Diagnosis not present

## 2022-10-26 DIAGNOSIS — C249 Malignant neoplasm of biliary tract, unspecified: Secondary | ICD-10-CM | POA: Diagnosis not present

## 2022-10-26 LAB — RAD ONC ARIA SESSION SUMMARY
Course Elapsed Days: 12
Plan Fractions Treated to Date: 3
Plan Fractions Treated to Date: 9
Plan Prescribed Dose Per Fraction: 3 Gy
Plan Prescribed Dose Per Fraction: 3 Gy
Plan Total Fractions Prescribed: 10
Plan Total Fractions Prescribed: 4
Plan Total Prescribed Dose: 12 Gy
Plan Total Prescribed Dose: 30 Gy
Reference Point Dosage Given to Date: 27 Gy
Reference Point Dosage Given to Date: 9 Gy
Reference Point Session Dosage Given: 3 Gy
Reference Point Session Dosage Given: 3 Gy
Session Number: 9

## 2022-10-26 NOTE — TOC Progression Note (Signed)
Transition of Care [facility]) - Progression Note    Patient Details  Name: Jason Lyons MRN: [id number] Date of Birth: 04-07-1956  Transition of Care [facility]) CM/SW [place], LCSW Phone Number: 10/26/2022, 9:41 AM  Clinical Narrative:    Pt still completing radiation treatments once complete pt can be discharged to [facility] when the bed is ready. TOC continues to follow.   Expected Discharge Plan: [place] Barriers to Discharge: No Barriers Identified  Expected Discharge Plan and Services In-house Referral: Clinical Social Work     Living arrangements for the past 2 months: Single Family Home Expected Discharge Date: 10/19/22                                     Social Determinants of Health (SDOH) Interventions SDOH Screenings   Food Insecurity: No Food Insecurity (10/14/2022)  Housing: Low Risk  (10/14/2022)  Transportation Needs: No Transportation Needs (10/14/2022)  Utilities: Not At Risk (10/14/2022)  Tobacco Use: Medium Risk (10/14/2022)    Readmission Risk Interventions     No data to display

## 2022-10-26 NOTE — Progress Notes (Signed)
PROGRESS NOTE    Jason Lyons  DXI:[id number] DOB: 1955-11-24 DOA: 10/12/2022 PCP: Pcp, No   Brief Narrative:  The patient is a 67 year old [place] male with a past medical history significant for but not limited to known adenocarcinoma of the biliary tract diagnosed in 05/13/2021 followed by Dr. Alvy Bimler on chemotherapy with cisplatin and gemcitabine but not currently managed at [facility] as he is on chemo there and history of known prior pulmonary embolism, pathological fracture T4 status post XRT in the past as well and other comorbidities was seen in the [facility] ED and 09/30/2019 because of pain in the back as well as numbness of bilateral hands and feet.  He was given oxycodone and was discharged home and was noted to not be able to pass stool for the last 3 weeks but still had control of his urine.  He is brought back to the ED on 10/12/2022 at [facility] with right-sided chest pain that radiated to the back and was found to have a T6 spinal metastasis as well as new scattered pulmonary nodules.  Neurosurgery was consulted and did not feel the patient was an operative candidate and he was given Decadron and transferred over to [facility] for consideration of radiation therapy which would be palliative.  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  He remains on steroids and pain is improving and he is getting radiation treatments.  Also did not had a really good bowel movement so had been initiated on Nulytely bowel regimen.  Now having bowel movements and is improved significantly.  He is medically stable for discharge unfortunately cannot be discharged until he completes his radiation treatments given that the SNF cannot arrange daily transport back and forth to the radiation center.   Patient continues to still have pain and continues to have some chest discomfort that radiates to the back stable but states it is a 6 out  of 10 today as opposed to a 7 out of 10.  Will try a trial of void and bladder scan every 8 hours today.  WBC was obtained and it was elevated but likely in the setting of steroid margination.  Currently remains afebrile and about the same as yesterday.   TOV done and unfortunately did not pass as he was retaining so Foley Catheter to be reinserted.  Patient is to get his last radiation treatment tomorrow and can be discharged to SNF afterwards.  Radiation oncology has recommended tapering of the Decadron please see Shona Simpson, PA note on 10/25/22 for tapering instructions.   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 Verlon Au Discussed with patient's former oncologist Dr. Alvy Bimler and she had nothing further to add as he needs follow-up at [facility] -C/w Pain control with Oxycodone IR 5 every 4 as needed moderate, Dilaudid 1 mg every 4 as needed severe -Pain is 7/10 today again and worse with transferring back and forth  from the bed    Metastatic Adenocarcinoma biliary tract diagnosed 05/30/2021 -Follows now at [place] follow-up when Discharged from SNF   Severe Constipation, improving  -Reported that he had not had a stool in over 3 weeks? 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 Gallbladder is not visualized.  Reported prior cholecystectomy. Common bile duct is not visualized." -Continue to Monitor and Trend and repeat CMP in the outpatient setting   Leukocytosis -WBC Trend: Recent Labs  Lab 10/12/22 1103 10/14/22 0410 10/15/22 0425 10/18/22 0435 10/20/22 0853 10/24/22 0450 10/25/22 0450  WBC 5.6 7.0 7.5 6.8 8.4 15.6* 14.1*  -In the setting of Steroid Demargination from Decadron as he is a febrile and has no S/S of Infection and is now improving; Patient is to start Decadron taper as outlined by Radiation Oncology in note 10/25/22 -Will discontinue Foley catheter as we do not want him to get a urinary tract infection and do a trial of void -Continue to monitor and trend and repeat CBC in a few days   Acute Urinary Retention -Had to have Foley catheter placed we will trial a trial of void today and discontinue Foley and bladder scan every 8h but continued to retain so foley will be reinserted -Continue to Monitor and Trend    AKI, improved -BUN/Cr Trend: Recent Labs  Lab 10/14/22 0410 10/16/22 0451 10/18/22 0435 10/19/22 0437 10/20/22 0853 10/24/22 0450 10/25/22 0450  BUN 25* 32* 57* 74* 35* 32* 29*  CREATININE 0.88 0.81 1.25* 1.99* 0.74 0.56* 0.53*  -IVF now stopped  -Given urinary retention Foley catheter was then placed he failed TOV so had to have Foley reinserted   -Avoid Nephrotoxic Medications, Contrast Dyes, Hypotension and Dehydration to Ensure Adequate Renal Perfusion and will need to Renally Adjust Meds -Continue to Monitor and Trend Renal Function carefully and repeat CMP in the outpatient setting   Hypoalbuminemia -Patient's Albumin Level Trend Recent Labs  Lab 10/14/22 0410 10/20/22 0853 10/24/22 0450 10/25/22 0450  ALBUMIN 3.1* 2.9* 2.6* 2.3*  -Continue to Monitor and Trend and Repeat in a few days    Anterior Chest/Scapular Pain, improving  -had CP relieved by GI cocktail on 10/15/22 -Started PPI with Pantoprazole  -Having intermittent chest pain and he had Left Sided Chest Pain that radiated around his side to his back -Troponin x 2 Negative and Flat as Troponin I was 5 x2 -EKG done and showed NSR at a rate of 69 with no evidence of ST Elevation or Depression and a Qtc of 390 -Trial of Nitroglycerin 0.4 mg SL    Thrombocytopenia -Patient's platelet count Trend: Recent Labs  Lab 10/12/22 1103 10/14/22 0410 10/15/22 0425 10/18/22 0435 10/20/22 0853 10/24/22 0450 10/25/22 0450  PLT 132* 143* 127* 152 139* 119* 120*  -Continue to  Monitor for S/Sx of Bleeding; No overt bleeding noted -Repeat CBC in a few days   Hx of Pulmonary Embolism -Continue anticoagulation with Apixaban 5 twice daily   Nonsevere Moderate Malnutrition in the Context of Chronic Illness Nutrition Status: Nutrition Problem: Moderate Malnutrition Etiology: chronic illness (cholangiocarcinoma, metastatic to bone) Signs/Symptoms: moderate muscle depletion, moderate fat depletion Interventions: Ensure Enlive (each supplement provides 350kcal and 20 grams of protein), Refer to RD note for recommendations  DVT prophylaxis: SCDs Start: 10/13/22 0536 apixaban (ELIQUIS) tablet 5 mg    Code Status: Full Code Family Communication: No family at bedside  Disposition Plan:  Level of care: Telemetry Status is: Inpatient Remains inpatient appropriate because: To  complete his radiation prior to being discharged to SNF with last dose of radiation being 10/27/2022   Consultants:  Radiation oncology Medical oncology Neurosurgery  Procedures:  As delineated as above   Antimicrobials:  Anti-infectives (From admission, onward)    None       Subjective: Seen and examined at bedside with the bedside [place] translator and the patient states that he is having more pain today is worse when he gets transferred in and out of bed with no radiation.  States that he understands that he had to have a catheter in.  States that his last radiation treatment is tomorrow.  Denies any nausea or vomiting.  No other concerns or complaints this time but felt a little uncomfortable from that was being transferred back to home the radiation stretcher back to his bed.  Objective: Vitals:   10/25/22 1100 10/25/22 2131 10/26/22 0347 10/26/22 1302  BP: 127/68 121/76 117/74 122/69  Pulse:  61 60 70  Resp: '16 18 19 18  '$ Temp:  98.1 F (36.7 C) 98 F (36.7 C) 97.7 F (36.5 C)  TempSrc:  Oral Oral Oral  SpO2:  97% 96% 100%  Weight:   66.1 kg   Height:        Intake/Output Summary (Last 24 hours) at 10/26/2022 1830 Last data filed at 10/26/2022 0300 Gross per 24 hour  Intake 240 ml  Output 600 ml  Net -360 ml   Filed Weights   10/21/22 0506 10/25/22 0500 10/26/22 0347  Weight: 63.6 kg 63.8 kg 66.1 kg   Examination: Physical Exam:  Constitutional: Thin [place] male who appears slightly uncomfortable and in pain Respiratory: Diminished to auscultation bilaterally, no wheezing, rales, rhonchi or crackles. Normal respiratory effort and patient is not tachypenic. No accessory muscle use.  Unlabored breathing Cardiovascular: RRR, no murmurs / rubs / gallops. S1 and S2 auscultated. No extremity edema.  Abdomen: Soft, non-tender, non-distended. Bowel sounds positive.  GU: Deferred. Musculoskeletal: No clubbing / cyanosis of digits/nails.  No appreciable joint  deformities however legs are in boots and he really does not have any movement in the lower extremities Skin: No rashes, lesions, ulcers on a limited skin evaluation. No induration; Warm and dry.  Neurologic: CN 2-12 grossly intact with no focal deficits. Romberg sign and cerebellar reflexes not assessed.  Psychiatric: Normal judgment and insight. Alert and oriented x 3. Normal mood and appropriate affect.   Data Reviewed: I have personally reviewed following labs and imaging studies  CBC: Recent Labs  Lab 10/20/22 0853 10/24/22 0450 10/25/22 0450  WBC 8.4 15.6* 14.1*  NEUTROABS 7.2 13.8* 12.3*  HGB 13.8 12.8* 13.2  HCT 40.8 38.2* 39.4  MCV 97.4 97.0 98.0  PLT 139* 119* 952*   Basic Metabolic Panel: Recent Labs  Lab 10/20/22 0853 10/24/22 0450  10/25/22 0450  NA 137 137 137  K 4.6 4.4 4.2  CL 106 104 104  CO2 '25 27 27  '$ GLUCOSE 193* 134* 148*  BUN 35* 32* 29*  CREATININE 0.74 0.56* 0.53*  CALCIUM 8.4* 8.9 8.8*  MG 2.4 2.1 2.2  PHOS 4.2 3.0 3.1   GFR: Estimated Creatinine Clearance: 76.9 mL/min (A) (by C-G formula based on SCr of 0.53 mg/dL (L)). Liver Function Tests: Recent Labs  Lab 10/20/22 0853 10/24/22 0450 10/25/22 0450  AST 46* 50* 56*  ALT 89* 112* 137*  ALKPHOS 213* 183* 167*  BILITOT 0.6 0.3 0.4  PROT 6.2* 5.8* 5.3*  ALBUMIN 2.9* 2.6* 2.3*   No results for input(s): "LIPASE", "AMYLASE" in the last 168 hours. No results for input(s): "AMMONIA" in the last 168 hours. Coagulation Profile: No results for input(s): "INR", "PROTIME" in the last 168 hours. Cardiac Enzymes: No results for input(s): "CKTOTAL", "CKMB", "CKMBINDEX", "TROPONINI" in the last 168 hours. BNP (last 3 results) No results for input(s): "PROBNP" in the last 8760 hours. HbA1C: No results for input(s): "HGBA1C" in the last 72 hours. CBG: Recent Labs  Lab 10/24/22 1653  GLUCAP 185*   Lipid Profile: No results for input(s): "CHOL", "HDL", "LDLCALC", "TRIG", "CHOLHDL", "LDLDIRECT"  in the last 72 hours. Thyroid Function Tests: No results for input(s): "TSH", "T4TOTAL", "FREET4", "T3FREE", "THYROIDAB" in the last 72 hours. Anemia Panel: No results for input(s): "VITAMINB12", "FOLATE", "FERRITIN", "TIBC", "IRON", "RETICCTPCT" in the last 72 hours. Sepsis Labs: No results for input(s): "PROCALCITON", "LATICACIDVEN" in the last 168 hours.  No results found for this or any previous visit (from the past 240 hour(s)).   Radiology Studies: No results found.  Scheduled Meds:  apixaban  5 mg Oral BID   Chlorhexidine Gluconate Cloth  6 each Topical Daily   dexamethasone  4 mg Oral Q6H   feeding supplement  237 mL Oral TID BM   pantoprazole  40 mg Oral Daily   senna  1 tablet Oral BID   Continuous Infusions:   LOS: 13 days   Raiford Noble, DO [facility] Available via Epic secure chat 7am-7pm After these hours, please refer to coverage provider listed on [email or web address] 10/26/2022, 6:30 PM

## 2022-10-27 ENCOUNTER — Ambulatory Visit
Admit: 2022-10-27 | Discharge: 2022-10-27 | Disposition: A | Discharge status: Home / Self Care | Payer: Medicare Other | Attending: Radiation Oncology | Admitting: Radiation Oncology

## 2022-10-27 ENCOUNTER — Ambulatory Visit: Payer: Medicare Other

## 2022-10-27 ENCOUNTER — Encounter: Payer: Self-pay | Admitting: Radiation Oncology

## 2022-10-27 ENCOUNTER — Other Ambulatory Visit: Payer: Self-pay

## 2022-10-27 DIAGNOSIS — E440 Moderate protein-calorie malnutrition: Secondary | ICD-10-CM | POA: Diagnosis not present

## 2022-10-27 DIAGNOSIS — G8220 Paraplegia, unspecified: Secondary | ICD-10-CM | POA: Diagnosis not present

## 2022-10-27 DIAGNOSIS — N179 Acute kidney failure, unspecified: Secondary | ICD-10-CM

## 2022-10-27 DIAGNOSIS — D72829 Elevated white blood cell count, unspecified: Secondary | ICD-10-CM

## 2022-10-27 DIAGNOSIS — C7951 Secondary malignant neoplasm of bone: Secondary | ICD-10-CM

## 2022-10-27 DIAGNOSIS — G9520 Unspecified cord compression: Secondary | ICD-10-CM

## 2022-10-27 DIAGNOSIS — R338 Other retention of urine: Secondary | ICD-10-CM

## 2022-10-27 LAB — COMPREHENSIVE METABOLIC PANEL WITH GFR
ALT: 122 U/L — ABNORMAL HIGH (ref 0–44)
AST: 38 U/L (ref 15–41)
Albumin: 2.6 g/dL — ABNORMAL LOW (ref 3.5–5.0)
Alkaline Phosphatase: 153 U/L — ABNORMAL HIGH (ref 38–126)
Anion gap: 6 (ref 5–15)
BUN: 35 mg/dL — ABNORMAL HIGH (ref 8–23)
CO2: 28 mmol/L (ref 22–32)
Calcium: 8.4 mg/dL — ABNORMAL LOW (ref 8.9–10.3)
Chloride: 102 mmol/L (ref 98–111)
Creatinine, Ser: 0.63 mg/dL (ref 0.61–1.24)
GFR, Estimated: >60 mL/min
Glucose, Bld: 129 mg/dL — ABNORMAL HIGH (ref 70–99)
Potassium: 4.5 mmol/L (ref 3.5–5.1)
Sodium: 136 mmol/L (ref 135–145)
Total Bilirubin: 0.6 mg/dL (ref 0.3–1.2)
Total Protein: 5.2 g/dL — ABNORMAL LOW (ref 6.5–8.1)

## 2022-10-27 LAB — CBC WITH DIFFERENTIAL/PLATELET
Abs Immature Granulocytes: 0.73 10*3/uL — ABNORMAL HIGH (ref 0.00–0.07)
Basophils Absolute: 0.1 10*3/uL (ref 0.0–0.1)
Basophils Relative: 1 %
Eosinophils Absolute: 0 10*3/uL (ref 0.0–0.5)
Eosinophils Relative: 0 %
HCT: 39.7 % (ref 39.0–52.0)
Hemoglobin: 13.3 g/dL (ref 13.0–17.0)
Immature Granulocytes: 4 %
Lymphocytes Relative: 2 %
Lymphs Abs: 0.4 10*3/uL — ABNORMAL LOW (ref 0.7–4.0)
MCH: 32.9 pg (ref 26.0–34.0)
MCHC: 33.5 g/dL (ref 30.0–36.0)
MCV: 98.3 fL (ref 80.0–100.0)
Monocytes Absolute: 0.7 10*3/uL (ref 0.1–1.0)
Monocytes Relative: 4 %
Neutro Abs: 15.1 10*3/uL — ABNORMAL HIGH (ref 1.7–7.7)
Neutrophils Relative %: 89 %
Platelets: 120 10*3/uL — ABNORMAL LOW (ref 150–400)
RBC: 4.04 MIL/uL — ABNORMAL LOW (ref 4.22–5.81)
RDW: 15.6 % — ABNORMAL HIGH (ref 11.5–15.5)
WBC: 17 10*3/uL — ABNORMAL HIGH (ref 4.0–10.5)
nRBC: 1.1 % — ABNORMAL HIGH (ref 0.0–0.2)

## 2022-10-27 LAB — HEPATITIS PANEL, ACUTE
HCV Ab: NONREACTIVE
Hep A IgM: NONREACTIVE
Hep B C IgM: NONREACTIVE
Hepatitis B Surface Ag: NONREACTIVE

## 2022-10-27 LAB — RAD ONC ARIA SESSION SUMMARY
Course Elapsed Days: 13
Plan Fractions Treated to Date: 10
Plan Fractions Treated to Date: 4
Plan Prescribed Dose Per Fraction: 3 Gy
Plan Prescribed Dose Per Fraction: 3 Gy
Plan Total Fractions Prescribed: 10
Plan Total Fractions Prescribed: 4
Plan Total Prescribed Dose: 12 Gy
Plan Total Prescribed Dose: 30 Gy
Reference Point Dosage Given to Date: 12 Gy
Reference Point Dosage Given to Date: 30 Gy
Reference Point Session Dosage Given: 3 Gy
Reference Point Session Dosage Given: 3 Gy
Session Number: 10

## 2022-10-27 LAB — MAGNESIUM: Magnesium: 2.2 mg/dL (ref 1.7–2.4)

## 2022-10-27 LAB — PHOSPHORUS: Phosphorus: 3.7 mg/dL (ref 2.5–4.6)

## 2022-10-27 MED ORDER — SENNOSIDES-DOCUSATE SODIUM 8.6-50 MG PO TABS
1.0000 | ORAL_TABLET | Freq: Two times a day (BID) | ORAL | Status: DC
  Administered 2022-10-27 – 2022-10-29 (×3): 1 via ORAL
  Filled 2022-10-27 (×3): qty 1

## 2022-10-27 MED ORDER — BISACODYL 10 MG RE SUPP
10.0000 mg | Freq: Every day | RECTAL | Status: DC
  Administered 2022-10-27 – 2022-10-28 (×2): 10 mg via RECTAL
  Filled 2022-10-27 (×2): qty 1

## 2022-10-27 NOTE — Progress Notes (Signed)
PROGRESS NOTE    Jason Lyons  WUJ:[id number] DOB: Sep 05, 1956 DOA: 10/12/2022 PCP: Pcp, No   Chief Complaint  Patient presents with   Chest Pain   CA    Brief Narrative:  The patient is a 67 year old [place] male with a past medical history significant for but not limited to known adenocarcinoma of the biliary tract diagnosed in 05/13/2021 followed by Dr. Alvy Bimler on chemotherapy with cisplatin and gemcitabine but not currently managed at [facility] as he is on chemo there and history of known prior pulmonary embolism, pathological fracture T4 status post XRT in the past as well and other comorbidities was seen in the [facility] ED and 09/29/2022 because of pain in the back as well as numbness of bilateral hands and feet.  He was given oxycodone and was discharged home and was noted to not be able to pass stool for the last 3 weeks but still had control of his urine.  He is brought back to the ED on 10/12/2022 at [facility] with right-sided chest pain that radiated to the back and was found to have a T6 spinal metastasis as well as new scattered pulmonary nodules.  Neurosurgery was consulted and did not feel the patient was an operative candidate and he was given Decadron and transferred over to [facility] for consideration of radiation therapy which would be palliative.  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  He remains on steroids and pain is improving and he is getting radiation treatments.  Also did not had a really good bowel movement so had been initiated on Nulytely bowel regimen.  Now having bowel movements and is improved significantly.  He is medically stable for discharge unfortunately cannot be discharged until he completes his radiation treatments given that the SNF cannot arrange daily transport back and forth to the radiation center.   Patient continues to still have pain and continues to have  some chest discomfort that radiates to the back stable but states it is a 6 out of 10 today as opposed to a 7 out of 10.  Will try a trial of void and bladder scan every 8 hours today.  WBC was obtained and it was elevated but likely in the setting of steroid margination.  Currently remains afebrile and about the same as yesterday.   TOV done and unfortunately did not pass as he was retaining so Foley Catheter to be reinserted.  Patient is to get his last radiation treatment tomorrow and can be discharged to SNF afterwards.  Radiation oncology has recommended tapering of the Decadron please see Shona Simpson, PA note on 10/25/22 for tapering instructions.    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 -Patient started on Decadron. -Patient seen by radiation oncology radiation therapy initiated, unfortunately it is noted that patient cannot safely be discharged to the hospital as transportation could not  be arranged from his skilled nursing facility daily back-and-forth radiation scented to a SNF. -Patient noted not a CIR candidate. -Dr. Verlon Au discussed with patient, oncologist, Dr.Gorsuch and she had nothing further to add as he follows up at [place] current pain regimen with oxycodone IR 5 mg every 4 hours as needed moderate pain, Dilaudid 1 mg every 4 hours as needed for severe pain. -Outpatient follow-up with primary oncologist and radiation  oncology.  2.  Metastatic adenocarcinoma biliary tract diagnosed 05/30/2021 -Outpatient follow-up with primary oncology at [facility].  3.  Severe constipation -Patient noted not to have had a bowel movement in over 3 weeks, patient noted to be started on MiraLAX and Senokot-S scheduled with no improvement. -Patient noted to have been disimpacted with small bowel movement 10/18/2022, not having bowel movements on GoLytely.  Patient noted to have plan another disimpaction. -Place on daily Dulcolax suppositories.  4.  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 -Right upper quadrant ultrasound done with heterogeneous nodular lesions in the liver measuring up to 3.6 cm x 2.9 cm suspicious for metastatic disease seen on recent chest CT.  Gallbladder not visualized.  Reported prior cholecystectomy. -Outpatient follow-up.  5.  Leukocytosis -Chest x-ray with no acute cardiopulmonary disease. -Patient afebrile. -Likely secondary to steroids. -Patient to be started on Decadron taper. -Foley catheter initially discontinued and Foley catheter placed back in due to urinary retention.  6.  Acute urinary retention -Foley catheter initially placed, patient underwent a voiding trial however failed voiding trial and Foley catheter placed back in. -Will need outpatient follow-up with urology.  7.  AKI -Likely secondary to postrenal azotemia as patient noted to have urinary retention, Foley catheter placed with good urinary output with a urine output of 2.4 L over the past 24 hours. -Renal function improved. -Repeat labs in the AM.  8.  Hypoalbuminemia -Nutritional supplementation.  9.  Anterior chest/scapular pain, improving -Chest pain noted to have been relieved by GI cocktail 10/15/2022. -Patient also with intermittent chest pain and left-sided chest  pain radiated around his side to his back. -Cardiac enzymes negative x 2. -EKG done with normal sinus rhythm with no ischemic changes noted. -Continue PPI. -Pain management.  10.  Thrombocytopenia -Stable.  11.  History of PE -Continue Eliquis.  12.  Nonsevere moderate malnutrition in the context of chronic illness, POA -Continue nutritional supplementation.  -   DVT prophylaxis: Eliquis Code Status: Full Family Communication: Updated patient.  No family at bedside. Disposition: SNF once radiation treatments have been completed  Status is: Inpatient Remains inpatient appropriate because: Severity of illness.   Consultants:  Neurosurgery: Dr. Reatha Armour 10/13/2022 Shona Simpson PA, radiation oncology 10/13/2022  Procedures:  CT angiogram chest 10/12/2022 Chest x-ray 10/12/2022 MRI C/T/L-spine 10/12/2022 Chest x-ray 10/12/2022 Right upper quadrant ultrasound 10/21/2022.   Antimicrobials:  Anti-infectives (From admission, onward)    None         Subjective: Sitting up in bed.  Denies any chest pain.  No shortness of breath.  No abdominal pain.  States wasting below has no feeling or movement which has not improved since admission.  Objective: Vitals:   10/26/22 1302 10/26/22 2019 10/27/22 0547 10/27/22 1144  BP: 122/69 110/71 115/73 112/71  Pulse: 70 67 (!) 57 (!) 57  Resp: '18 14 16 16  '$ Temp: 97.7 F (36.5 C) 98.2 F (36.8 C) 98.1 F (36.7 C) (!) 97.5 F (  36.4 C)  TempSrc: Oral Oral Oral Oral  SpO2: 100% 97% 99% 99%  Weight:      Height:        Intake/Output Summary (Last 24 hours) at 10/27/2022 1303 Last data filed at 10/27/2022 0914 Gross per 24 hour  Intake 480 ml  Output 2650 ml  Net -2170 ml   Filed Weights   10/21/22 0506 10/25/22 0500 10/26/22 0347  Weight: 63.6 kg 63.8 kg 66.1 kg    Examination:  General exam: Appears calm and comfortable  Respiratory system: Clear to auscultation. Respiratory effort normal. Cardiovascular system: S1 & S2 heard,  RRR. No JVD, murmurs, rubs, gallops or clicks. No pedal edema. Gastrointestinal system: Abdomen is nondistended, soft and nontender. No organomegaly or masses felt. Normal bowel sounds heard. Central nervous system: Alert and oriented.  Paraplegia.   Extremities: Symmetric 5 x 5 power. Skin: No rashes, lesions or ulcers Psychiatry: Judgement and insight appear normal. Mood & affect appropriate.     Data Reviewed: I have personally reviewed following labs and imaging studies  CBC: Recent Labs  Lab 10/24/22 0450 10/25/22 0450 10/27/22 0433  WBC 15.6* 14.1* 17.0*  NEUTROABS 13.8* 12.3* 15.1*  HGB 12.8* 13.2 13.3  HCT 38.2* 39.4 39.7  MCV 97.0 98.0 98.3  PLT 119* 120* 120*    Basic Metabolic Panel: Recent Labs  Lab 10/24/22 0450 10/25/22 0450 10/27/22 0433  NA 137 137 136  K 4.4 4.2 4.5  CL 104 104 102  CO2 '27 27 28  '$ GLUCOSE 134* 148* 129*  BUN 32* 29* 35*  CREATININE 0.56* 0.53* 0.63  CALCIUM 8.9 8.8* 8.4*  MG 2.1 2.2 2.2  PHOS 3.0 3.1 3.7    GFR: Estimated Creatinine Clearance: 76.9 mL/min (by C-G formula based on SCr of 0.63 mg/dL).  Liver Function Tests: Recent Labs  Lab 10/24/22 0450 10/25/22 0450 10/27/22 0433  AST 50* 56* 38  ALT 112* 137* 122*  ALKPHOS 183* 167* 153*  BILITOT 0.3 0.4 0.6  PROT 5.8* 5.3* 5.2*  ALBUMIN 2.6* 2.3* 2.6*    CBG: Recent Labs  Lab 10/24/22 1653  GLUCAP 185*     No results found for this or any previous visit (from the past 240 hour(s)).       Radiology Studies: No results found.      Scheduled Meds:  apixaban  5 mg Oral BID   Chlorhexidine Gluconate Cloth  6 each Topical Daily   dexamethasone  4 mg Oral Q6H   feeding supplement  237 mL Oral TID BM   pantoprazole  40 mg Oral Daily   senna  1 tablet Oral BID   Continuous Infusions:   LOS: 14 days    Time spent: 35 minutes    Irine Seal, MD [facility]   To contact the attending provider between 7A-7P or the covering provider  during after hours 7P-7A, please log into the web site [email or web address] and access using universal [place] password for that web site. If you do not have the password, please call the hospital operator.  10/27/2022, 1:03 PM

## 2022-10-27 NOTE — Progress Notes (Signed)
PT Cancellation Note  Patient Details Name: Benno Brensinger MRN: [id number] DOB: Mar 09, 1956   Cancelled Treatment:    Reason Eval/Treat Not Completed: Pain limiting ability to participate (attempted supine to sit, began assisting pt to advance BLEs to edge of bed, pt reported severe pain and requested to stop. Noted pt had pain medication at 12:09. RN notified of pt's pain. Will follow.)   Philomena Doheny PT 10/27/2022  Acute Rehabilitation Services  Office [phone]

## 2022-10-28 LAB — BASIC METABOLIC PANEL
Anion gap: 5 (ref 5–15)
BUN: 33 mg/dL — ABNORMAL HIGH (ref 8–23)
CO2: 27 mmol/L (ref 22–32)
Calcium: 8.2 mg/dL — ABNORMAL LOW (ref 8.9–10.3)
Chloride: 101 mmol/L (ref 98–111)
Creatinine, Ser: 0.55 mg/dL — ABNORMAL LOW (ref 0.61–1.24)
GFR, Estimated: >60 mL/min (ref >60)
Glucose, Bld: 149 mg/dL — ABNORMAL HIGH (ref 70–99)
Potassium: 4.4 mmol/L (ref 3.5–5.1)
Sodium: 133 mmol/L — ABNORMAL LOW (ref 135–145)

## 2022-10-28 LAB — CBC WITH DIFFERENTIAL/PLATELET
Abs Immature Granulocytes: 0.41 10*3/uL — ABNORMAL HIGH (ref 0.00–0.07)
Basophils Absolute: 0.1 10*3/uL (ref 0.0–0.1)
Basophils Relative: 0 %
Eosinophils Absolute: 0 10*3/uL (ref 0.0–0.5)
Eosinophils Relative: 0 %
HCT: 38.8 % — ABNORMAL LOW (ref 39.0–52.0)
Hemoglobin: 13.1 g/dL (ref 13.0–17.0)
Immature Granulocytes: 3 %
Lymphocytes Relative: 2 %
Lymphs Abs: 0.3 10*3/uL — ABNORMAL LOW (ref 0.7–4.0)
MCH: 33 pg (ref 26.0–34.0)
MCHC: 33.8 g/dL (ref 30.0–36.0)
MCV: 97.7 fL (ref 80.0–100.0)
Monocytes Absolute: 0.6 10*3/uL (ref 0.1–1.0)
Monocytes Relative: 4 %
Neutro Abs: 13.3 10*3/uL — ABNORMAL HIGH (ref 1.7–7.7)
Neutrophils Relative %: 91 %
Platelets: 108 10*3/uL — ABNORMAL LOW (ref 150–400)
RBC: 3.97 MIL/uL — ABNORMAL LOW (ref 4.22–5.81)
RDW: 15.7 % — ABNORMAL HIGH (ref 11.5–15.5)
WBC: 14.6 10*3/uL — ABNORMAL HIGH (ref 4.0–10.5)
nRBC: 0.4 % — ABNORMAL HIGH (ref 0.0–0.2)

## 2022-10-28 MED ORDER — PANTOPRAZOLE SODIUM 40 MG PO TBEC: 40.0000 mg | DELAYED_RELEASE_TABLET | Freq: Every day | ORAL | Status: DC

## 2022-10-28 MED ORDER — SENNOSIDES-DOCUSATE SODIUM 8.6-50 MG PO TABS: 1.0000 | ORAL_TABLET | Freq: Two times a day (BID) | ORAL | Status: DC

## 2022-10-28 MED ORDER — POLYETHYLENE GLYCOL 3350 17 G PO PACK: 17.0000 g | PACK | Freq: Two times a day (BID) | ORAL | 0 refills | Status: DC

## 2022-10-28 MED ORDER — ENSURE ENLIVE PO LIQD: 237.0000 mL | Freq: Three times a day (TID) | ORAL | 12 refills | Status: DC

## 2022-10-28 MED ORDER — DEXAMETHASONE 2 MG PO TABS: ORAL_TABLET | ORAL | 0 refills | Status: DC

## 2022-10-28 MED ORDER — HYDROMORPHONE HCL 2 MG PO TABS: 1.0000 mg | ORAL_TABLET | Freq: Four times a day (QID) | ORAL | 0 refills | Status: DC | PRN

## 2022-10-28 MED ORDER — APIXABAN 5 MG PO TABS: 5.0000 mg | ORAL_TABLET | Freq: Two times a day (BID) | ORAL | 2 refills | Status: DC

## 2022-10-28 NOTE — Plan of Care (Signed)

## 2022-10-28 NOTE — Discharge Summary (Signed)
Physician Discharge Summary  Jason Lyons WNI:[id number] DOB: [birth year] DOA: 10/12/2022  PCP: Pcp, No  Admit date: 10/12/2022 Discharge date: 10/28/2022  Time spent: 60 minutes  Recommendations for Outpatient Follow-up:  Follow-up with MD at SNF.  Patient will need a basic metabolic profile done in 1 week to follow-up on electrolytes and renal function.  Patient need a CBC done to follow-up on counts. Follow-up with Dr. Nelda Marseille, hematology/oncology at [facility] in 1 week.  Patient will need lab work of a basic metabolic profile, CBC done on follow-up as well as magnesium levels. Follow-up with Dr. Lisbeth Renshaw, radiation oncology in 2 weeks. Follow-up with [facility] in 1 to 2 weeks for follow-up of urinary retention and voiding trial.   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  Diet recommendation: Regular diet  Filed Weights   10/21/22 0506 10/25/22 0500 10/26/22 0347  Weight: 63.6 kg 63.8 kg 66.1 kg    History of present illness:  HPI Dr. Ernesto Rutherford This is an unfortunate 67 year old [place] speaking only gentleman with history of cholangiocarcinoma, metastatic to bone .  He has been on chemotherapy every 3 weeks for at least the past 2 years.  He has had prior mets to T4 spine which was treated with radiation therapy.  The patient's chemotherapy is not at [facility].  He presents with complaint of inability to walk.  He states he has had progressive lower extremity weakness for the past 4 weeks which has not resulted in his inability to  ambulate.  Per patient he states his sensations intact.  He states he has control his urination but does not believe he is poop for most 3 weeks.  He denies any abdominal pain, nausea or vomiting.  He additionally endorses left-sided chest pain.  He states this is also been present for the past 4 weeks.  He was recently inpatient at [facility] for chest pains.  He was seen by cardiology and told it was cancer related.     In the ER CT shows a new T6 spinal mets as well as scattered new pulmonary nodules.   History provided by patient.  Language line used   Review of Systems:  The patient denies anorexia, fever, weight loss,, vision loss, decreased hearing, hoarseness, syncope, dyspnea on exertion, peripheral edema, balance deficits, hemoptysis, abdominal pain, melena, hematochezia, severe indigestion/heartburn, hematuria, incontinence, genital sores, muscle weakness, suspicious skin lesions, transient blindness, depression, unusual weight change, abnormal bleeding, enlarged lymph nodes, angioedema, and breast masses. Positives: Lower extremity weakness, inability to ambulate, chest pain.  Hospital Course:  #1 paraplegia and cord compression at T6 level with encroachment of the thecal sac -Patient seen by neurosurgery, noted not to be a surgical candidate due to failure of systemic treatment and length of time with her lower extremity weakness and recommended palliative radiation per oncology. -Patient started on Decadron. -Patient seen by radiation oncology radiation therapy initiated, unfortunately it is noted that patient cannot safely be discharged from the hospital as transportation could not be arranged from his skilled nursing  facility daily back-and-forth radiation scented to a SNF. -Patient completed course of radiation treatment. -Patient noted not a CIR candidate. -Dr. Verlon Au discussed with patient, oncologist, Dr.Gorsuch and she had nothing further to add as he follows up at  [facility]. -Patient was placed on current pain regimen with oxycodone IR 5 mg every 4 hours as needed moderate pain, Dilaudid 1 mg every 4 hours as needed for severe pain. -Patient was discharged on Dilaudid 1 mg every 6 hours as needed for pain. -Outpatient follow-up with primary oncologist and radiation oncology.   2.  Metastatic adenocarcinoma biliary tract diagnosed 05/30/2021 -Outpatient follow-up with primary oncology at [facility].   3.  Severe constipation -Patient noted not to have had a bowel movement in over 3 weeks, patient noted to be started on MiraLAX and Senokot-S scheduled with no improvement. -Patient noted to have been disimpacted with small bowel movement 10/18/2022, not having bowel movements on GoLytely.  Patient noted to have plan another disimpaction. -Patient placed on Dulcolax suppositories will be discharged on bowel regimen of MiraLAX twice daily, Senokot-S twice daily.     4.  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 -Right upper quadrant ultrasound done with heterogeneous nodular lesions in the liver measuring up to 3.6 cm x 2.9 cm suspicious for metastatic disease seen on recent chest CT.  Gallbladder not visualized.  Reported prior cholecystectomy. -Outpatient follow-up with hematology/oncology..   5.  Leukocytosis -Chest x-ray with no acute cardiopulmonary disease. -Patient afebrile. -Likely secondary to steroids. -Patient to be started on Decadron taper. -Leukocytosis trended down with steroid taper. -Outpatient follow-up.   6.  Acute urinary retention -Foley catheter initially placed, patient underwent a voiding trial however failed voiding trial and Foley catheter placed back in. -Will need outpatient follow-up with urology for voiding trial and further evaluation..   7.  AKI -Likely secondary to  postrenal azotemia as patient noted to have urinary retention, Foley catheter placed with good urinary output with a urine output of 1 L over the past 24 hours prior to admission. -Renal function improved. -Patient underwent voiding trial which he failed and as such Foley catheter was placed back in. -Will need outpatient follow-up with urology.   8.  Hypoalbuminemia -Patient maintained on nutritional supplementation.   9.  Anterior chest/scapular pain, improving -Chest pain noted to have been relieved by GI cocktail 10/15/2022. -Patient also with intermittent chest pain and left-sided chest pain radiated around his side to his back. -Cardiac enzymes negative x 2. -EKG done with normal sinus rhythm with no ischemic changes noted. -Patient maintained on PPI which he will be discharged on. -Pain management.   10.  Thrombocytopenia -Stable.   11.  History of PE -Patient maintained on Eliquis.   12.  Nonsevere moderate malnutrition in the context of chronic illness, POA -Patient maintained on nutritional supplementation throughout the hospitalization.   -  Procedures: CT angiogram chest 10/12/2022 Chest x-ray 10/12/2022 MRI C/T/L-spine 10/12/2022 Chest x-ray 10/12/2022 Right upper quadrant ultrasound 10/21/2022.  Radiation treatments    Consultations: Neurosurgery: Dr. Reatha Armour 10/13/2022 Shona Simpson PA, radiation oncology 10/13/2022  Discharge Exam: Vitals:   10/28/22 0601 10/28/22 1244  BP: 106/72 113/73  Pulse: (!) 59 60  Resp: 18 18  Temp: 97.6 F (36.4 C) 97.8 F (36.6 C)  SpO2: 98% 100%    General: NAD Cardiovascular: RRR no murmurs rubs or gallops.  No JVD.  No  lower extremity edema. Respiratory: Clear to auscultation bilaterally anterior lung fields.  No wheezes, no crackles, no rhonchi.  Fair air movement.  Speaking in full sentences.  Discharge Instructions   Discharge Instructions     Diet general   Complete by: As directed    Increase activity slowly    Complete by: As directed    Increase activity slowly   Complete by: As directed       Allergies as of 10/28/2022   No Known Allergies      Medication List     STOP taking these medications    oxyCODONE 5 MG immediate release tablet Commonly known as: Oxy IR/ROXICODONE       TAKE these medications    apixaban 5 MG Tabs tablet Commonly known as: ELIQUIS U?ng 1 vin 2 l?n m?i ngy. (Take 1 tablet by mouth 2 times daily.)   cholecalciferol 25 MCG (1000 UNIT) tablet Commonly known as: VITAMIN D3 Take 1,000 Units by mouth daily.   dexamethasone 2 MG tablet Commonly known as: DECADRON Take 2 tablets (4 mg total) by mouth 2 (two) times daily for 7 days, THEN 1 tablet (2 mg total) 2 (two) times daily for 7 days, THEN 1 tablet (2 mg total) daily for 7 days, THEN 1 tablet (2 mg total) every other day for 7 days. Start taking on: October 28, 2022   feeding supplement Liqd Take 237 mLs by mouth 3 (three) times daily between meals.   HYDROmorphone 2 MG tablet Commonly known as: Dilaudid Take 0.5 tablets (1 mg total) by mouth every 6 (six) hours as needed for severe pain.   pantoprazole 40 MG tablet Commonly known as: PROTONIX Take 1 tablet (40 mg total) by mouth daily.   polyethylene glycol 17 g packet Commonly known as: MIRALAX / GLYCOLAX Take 17 g by mouth 2 (two) times daily.   senna-docusate 8.6-50 MG tablet Commonly known as: Senokot-S Take 1 tablet by mouth 2 (two) times daily.       No Known Allergies  Contact information for follow-up providers     MD at SNF Follow up.          Karle Starch, MD. Schedule an appointment as soon as possible for a visit in 1 week(s).   Specialty: Internal Medicine Contact information: [place] [address] [phone]         Kyung Rudd, MD. Schedule an appointment as soon as possible for a visit in 2 week(s).   Specialty: Radiation Oncology Contact information: [address] [place] [address] [phone]         [facility]. Schedule an appointment as soon as possible for a visit in 1 week(s).   Why: Follow-up in 1 to 2 weeks for further evaluation of urinary retention and void trial. Contact information: [place]             Contact information for after-discharge care     [facility] .   Service: Skilled Nursing Contact information: [address] [place] [phone]                      The results of significant diagnostics from this hospitalization (including imaging, microbiology, ancillary and laboratory) are listed below for reference.    Significant Diagnostic Studies: US Abdomen Limited RUQ (LIVER/GB)  Result Date: 10/21/2022 CLINICAL  DATA:  Abnormal LFTs EXAM: ULTRASOUND ABDOMEN LIMITED RIGHT UPPER QUADRANT COMPARISON:  CT 10/26/2021 FINDINGS: Gallbladder: Not visualized.  Reported prior cholecystectomy. Common bile duct: Common bile duct is not visualized. Liver: There are heterogeneous nodular lesions with indistinct borders, measuring 3.4 x 2.0 x 3.6 cm and 2.9 x 1.9 x 2.7 cm. These likely correspond to the measured lesions on recent chest CT. Portal vein is patent on color Doppler imaging with normal direction of blood flow towards the liver. Other: None. IMPRESSION: Heterogeneous nodular lesions in the liver measuring up to 3.6 cm and 2.9 cm, suspicious for metastatic disease, as seen on recent chest CT. Gallbladder is not visualized.  Reported prior cholecystectomy. Common bile duct is not visualized. Electronically Signed   By: Maurine Simmering M.D.   On: 10/21/2022 08:11   MR THORACIC SPINE W WO CONTRAST  Result Date: 10/13/2022 CLINICAL DATA:  Osseous metastatic disease EXAM: MRI CERVICAL, THORACIC AND LUMBAR SPINE WITHOUT AND WITH CONTRAST TECHNIQUE: Multiplanar and multiecho pulse  sequences of the cervical spine, to include the craniocervical junction and cervicothoracic junction, and thoracic and lumbar spine, were obtained without and with intravenous contrast. CONTRAST:  42m GADAVIST GADOBUTROL 1 MMOL/ML IV SOLN COMPARISON:  None available FINDINGS: MRI CERVICAL SPINE FINDINGS Alignment: Normal Vertebrae: No fracture, evidence of discitis, or bone lesion. Cord: Normal signal and morphology. Posterior Fossa, vertebral arteries, paraspinal tissues: Negative Disc levels: C1-2: Unremarkable. C2-3: Small central disc protrusion. There is no spinal canal stenosis. No neural foraminal stenosis. C3-4: Normal disc space and facet joints. There is no spinal canal stenosis. No neural foraminal stenosis. C4-5: Normal disc space and facet joints. There is no spinal canal stenosis. No neural foraminal stenosis. C5-6: Small disc bulge. There is no spinal canal stenosis. Mild left neural foraminal stenosis. C6-7: Normal disc space and facet joints. There is no spinal canal stenosis. No neural foraminal stenosis. C7-T1: Normal disc space and facet joints. There is no spinal canal stenosis. No neural foraminal stenosis. MRI THORACIC SPINE FINDINGS Alignment:  Physiologic. Vertebrae: There are multiple expansile, contrast-enhancing masses. There are circumferential lesions at T2 and T3 that encroach on the spinal canal, particularly at right T3. There is a similar lesion of T6 that also encroaches on the spinal canal and causes severe stenosis of the thecal sac. Mild T4 compression deformity. Edema at T4 has resolved. Cord:  Normal signal and morphology. Paraspinal and other soft tissues: Pulmonary nodules are better assessed on the earlier CT. Disc levels: The thecal sac is effaced at the T2, T3 and T6 levels. There is moderate mass effect on the spinal cord at the T6 level. MRI LUMBAR SPINE FINDINGS Segmentation:  Standard. Alignment:  Normal Vertebrae: No fracture, evidence of discitis, or bone lesion. L1  hemangioma. Conus medullaris and cauda equina: Conus extends to the L1 level. Conus and cauda equina appear normal. Paraspinal and other soft tissues: Negative Disc levels: L1-L2: Normal disc space and facet joints. No spinal canal stenosis. No neural foraminal stenosis. L2-L3: Normal disc space and facet joints. No spinal canal stenosis. No neural foraminal stenosis. L3-L4: Normal disc space and facet joints. No spinal canal stenosis. No neural foraminal stenosis. L4-L5: Left asymmetric disc bulge with narrowing of left lateral recess and posterior displacement of the left L5 nerve root. No central spinal canal stenosis. No neural foraminal stenosis. L5-S1: Left asymmetric disc bulge with mild left lateral recess narrowing and posterior displacement of the left S1 nerve root. No spinal canal stenosis. No neural foraminal stenosis.  Visualized sacrum: Normal. IMPRESSION: 1. Multiple expansile, contrast-enhancing masses of the upper thoracic spine with epidural extension causing encroachment on the thecal sac. Moderate mass effect on the spinal cord at the T6 level 2. No metastatic disease of the cervical or lumbar spine. 3. Left lateral recess narrowing at L4-5 and L5-S1 with posterior displacement of the left L5 and S1 nerve roots. 4. Resolution of edema at T4 without progression of height loss. Electronically Signed   By: Ulyses Jarred M.D.   On: 10/13/2022 00:20   MR Lumbar Spine W Wo Contrast  Result Date: 10/13/2022 CLINICAL DATA:  Osseous metastatic disease EXAM: MRI CERVICAL, THORACIC AND LUMBAR SPINE WITHOUT AND WITH CONTRAST TECHNIQUE: Multiplanar and multiecho pulse sequences of the cervical spine, to include the craniocervical junction and cervicothoracic junction, and thoracic and lumbar spine, were obtained without and with intravenous contrast. CONTRAST:  24m GADAVIST GADOBUTROL 1 MMOL/ML IV SOLN COMPARISON:  None available FINDINGS: MRI CERVICAL SPINE FINDINGS Alignment: Normal Vertebrae: No  fracture, evidence of discitis, or bone lesion. Cord: Normal signal and morphology. Posterior Fossa, vertebral arteries, paraspinal tissues: Negative Disc levels: C1-2: Unremarkable. C2-3: Small central disc protrusion. There is no spinal canal stenosis. No neural foraminal stenosis. C3-4: Normal disc space and facet joints. There is no spinal canal stenosis. No neural foraminal stenosis. C4-5: Normal disc space and facet joints. There is no spinal canal stenosis. No neural foraminal stenosis. C5-6: Small disc bulge. There is no spinal canal stenosis. Mild left neural foraminal stenosis. C6-7: Normal disc space and facet joints. There is no spinal canal stenosis. No neural foraminal stenosis. C7-T1: Normal disc space and facet joints. There is no spinal canal stenosis. No neural foraminal stenosis. MRI THORACIC SPINE FINDINGS Alignment:  Physiologic. Vertebrae: There are multiple expansile, contrast-enhancing masses. There are circumferential lesions at T2 and T3 that encroach on the spinal canal, particularly at right T3. There is a similar lesion of T6 that also encroaches on the spinal canal and causes severe stenosis of the thecal sac. Mild T4 compression deformity. Edema at T4 has resolved. Cord:  Normal signal and morphology. Paraspinal and other soft tissues: Pulmonary nodules are better assessed on the earlier CT. Disc levels: The thecal sac is effaced at the T2, T3 and T6 levels. There is moderate mass effect on the spinal cord at the T6 level. MRI LUMBAR SPINE FINDINGS Segmentation:  Standard. Alignment:  Normal Vertebrae: No fracture, evidence of discitis, or bone lesion. L1 hemangioma. Conus medullaris and cauda equina: Conus extends to the L1 level. Conus and cauda equina appear normal. Paraspinal and other soft tissues: Negative Disc levels: L1-L2: Normal disc space and facet joints. No spinal canal stenosis. No neural foraminal stenosis. L2-L3: Normal disc space and facet joints. No spinal canal  stenosis. No neural foraminal stenosis. L3-L4: Normal disc space and facet joints. No spinal canal stenosis. No neural foraminal stenosis. L4-L5: Left asymmetric disc bulge with narrowing of left lateral recess and posterior displacement of the left L5 nerve root. No central spinal canal stenosis. No neural foraminal stenosis. L5-S1: Left asymmetric disc bulge with mild left lateral recess narrowing and posterior displacement of the left S1 nerve root. No spinal canal stenosis. No neural foraminal stenosis. Visualized sacrum: Normal. IMPRESSION: 1. Multiple expansile, contrast-enhancing masses of the upper thoracic spine with epidural extension causing encroachment on the thecal sac. Moderate mass effect on the spinal cord at the T6 level 2. No metastatic disease of the cervical or lumbar spine. 3. Left lateral recess narrowing  at L4-5 and L5-S1 with posterior displacement of the left L5 and S1 nerve roots. 4. Resolution of edema at T4 without progression of height loss. Electronically Signed   By: Ulyses Jarred M.D.   On: 10/13/2022 00:20   MR CERVICAL SPINE W WO CONTRAST  Result Date: 10/13/2022 CLINICAL DATA:  Osseous metastatic disease EXAM: MRI CERVICAL, THORACIC AND LUMBAR SPINE WITHOUT AND WITH CONTRAST TECHNIQUE: Multiplanar and multiecho pulse sequences of the cervical spine, to include the craniocervical junction and cervicothoracic junction, and thoracic and lumbar spine, were obtained without and with intravenous contrast. CONTRAST:  54m GADAVIST GADOBUTROL 1 MMOL/ML IV SOLN COMPARISON:  None available FINDINGS: MRI CERVICAL SPINE FINDINGS Alignment: Normal Vertebrae: No fracture, evidence of discitis, or bone lesion. Cord: Normal signal and morphology. Posterior Fossa, vertebral arteries, paraspinal tissues: Negative Disc levels: C1-2: Unremarkable. C2-3: Small central disc protrusion. There is no spinal canal stenosis. No neural foraminal stenosis. C3-4: Normal disc space and facet joints. There is  no spinal canal stenosis. No neural foraminal stenosis. C4-5: Normal disc space and facet joints. There is no spinal canal stenosis. No neural foraminal stenosis. C5-6: Small disc bulge. There is no spinal canal stenosis. Mild left neural foraminal stenosis. C6-7: Normal disc space and facet joints. There is no spinal canal stenosis. No neural foraminal stenosis. C7-T1: Normal disc space and facet joints. There is no spinal canal stenosis. No neural foraminal stenosis. MRI THORACIC SPINE FINDINGS Alignment:  Physiologic. Vertebrae: There are multiple expansile, contrast-enhancing masses. There are circumferential lesions at T2 and T3 that encroach on the spinal canal, particularly at right T3. There is a similar lesion of T6 that also encroaches on the spinal canal and causes severe stenosis of the thecal sac. Mild T4 compression deformity. Edema at T4 has resolved. Cord:  Normal signal and morphology. Paraspinal and other soft tissues: Pulmonary nodules are better assessed on the earlier CT. Disc levels: The thecal sac is effaced at the T2, T3 and T6 levels. There is moderate mass effect on the spinal cord at the T6 level. MRI LUMBAR SPINE FINDINGS Segmentation:  Standard. Alignment:  Normal Vertebrae: No fracture, evidence of discitis, or bone lesion. L1 hemangioma. Conus medullaris and cauda equina: Conus extends to the L1 level. Conus and cauda equina appear normal. Paraspinal and other soft tissues: Negative Disc levels: L1-L2: Normal disc space and facet joints. No spinal canal stenosis. No neural foraminal stenosis. L2-L3: Normal disc space and facet joints. No spinal canal stenosis. No neural foraminal stenosis. L3-L4: Normal disc space and facet joints. No spinal canal stenosis. No neural foraminal stenosis. L4-L5: Left asymmetric disc bulge with narrowing of left lateral recess and posterior displacement of the left L5 nerve root. No central spinal canal stenosis. No neural foraminal stenosis. L5-S1: Left  asymmetric disc bulge with mild left lateral recess narrowing and posterior displacement of the left S1 nerve root. No spinal canal stenosis. No neural foraminal stenosis. Visualized sacrum: Normal. IMPRESSION: 1. Multiple expansile, contrast-enhancing masses of the upper thoracic spine with epidural extension causing encroachment on the thecal sac. Moderate mass effect on the spinal cord at the T6 level 2. No metastatic disease of the cervical or lumbar spine. 3. Left lateral recess narrowing at L4-5 and L5-S1 with posterior displacement of the left L5 and S1 nerve roots. 4. Resolution of edema at T4 without progression of height loss. Electronically Signed   By: KUlyses JarredM.D.   On: 10/13/2022 00:20   CT Angio Chest PE W/Cm &/Or Wo  Cm  Result Date: 10/12/2022 CLINICAL DATA:  History of cholangiocarcinoma with right-sided chest pain radiating into the back EXAM: CT ANGIOGRAPHY CHEST WITH CONTRAST TECHNIQUE: Multidetector CT imaging of the chest was performed using the standard protocol during bolus administration of intravenous contrast. Multiplanar CT image reconstructions and MIPs were obtained to evaluate the vascular anatomy. RADIATION DOSE REDUCTION: This exam was performed according to the departmental dose-optimization program which includes automated exposure control, adjustment of the mA and/or kV according to patient size and/or use of iterative reconstruction technique. CONTRAST:  43m OMNIPAQUE IOHEXOL 350 MG/ML SOLN COMPARISON:  CT chest dated 10/26/2021 FINDINGS: Cardiovascular: The study is high quality for the evaluation of pulmonary embolism. There are no filling defects in the central, lobar, segmental or subsegmental pulmonary artery branches to suggest acute pulmonary embolism. Ascending aorta measures 4.1 cm, unchanged. Right chest wall port terminates at the superior cavoatrial junction. Normal heart size. No significant pericardial fluid/thickening. Coronary artery calcifications and  aortic atherosclerosis. Mediastinum/Nodes: Imaged thyroid gland without nodules meeting criteria for imaging follow-up by size. Normal esophagus. Unchanged 10 mm right hilar lymph node (5:62). Lungs/Pleura: The central airways are patent. Scattered pulmonary nodules are new or slightly increased in size, for example left upper lobe 6 x 5 mm nodule (6:29), previously 2 mm, subpleural left upper lobe nodule measuring 5 x 4 mm (6:67), previously 2 mm, and subpleural 4 x 4 mm right lower lobe nodule (6:60). New 2 mm subpleural left upper lobe ground-glass nodule (6:36) and 3 mm left lower lobe nodule (6:89). Unchanged left lower lobe 3 mm perifissural nodule (6:74). No focal consolidation. No pneumothorax. No pleural effusion. Upper abdomen: Increased atrophic appearance of the right hepatic lobe. Additional multifocal hypoattenuating foci measuring up to 3.4 cm in segment 2/3 (5:128) are new. Musculoskeletal: Lytic and sclerotic appearance of T2 and T6 and right transverse process of T3 are new. Similar compression deformity of T4 with increased lytic appearance. Review of the MIP images confirms the above findings. IMPRESSION: 1. No acute pulmonary embolism. 2. New and increased size of scattered pulmonary nodules, suspicious for metastatic disease. 3. New multifocal hypoattenuating foci in the liver, suspicious for metastatic disease. 4. New lytic and sclerotic appearance of T2 and T6 and right transverse process of T3, suspicious for osseous metastatic disease. Similar compression deformity of T4 with increased lytic appearance. 5. Coronary artery calcifications. Aortic Atherosclerosis (ICD10-I70.0). Electronically Signed   By: LDarrin NipperM.D.   On: 10/12/2022 14:21   DG Chest 2 View  Result Date: 10/12/2022 CLINICAL DATA:  Chest pain. EXAM: CHEST - 2 VIEW COMPARISON:  May 13, 2021. FINDINGS: The heart size and mediastinal contours are within normal limits. Both lungs are clear. Right internal jugular  Port-A-Cath is noted with distal tip in expected position of the SVC. The visualized skeletal structures are unremarkable. IMPRESSION: No active cardiopulmonary disease. Electronically Signed   By: JMarijo ConceptionM.D.   On: 10/12/2022 11:48    Microbiology: No results found for this or any previous visit (from the past 240 hour(s)).   Labs: Basic Metabolic Panel: Recent Labs  Lab 10/24/22 0450 10/25/22 0450 10/27/22 0433 10/28/22 0435  NA 137 137 136 133*  K 4.4 4.2 4.5 4.4  CL 104 104 102 101  CO2 '27 27 28 27  '$ GLUCOSE 134* 148* 129* 149*  BUN 32* 29* 35* 33*  CREATININE 0.56* 0.53* 0.63 0.55*  CALCIUM 8.9 8.8* 8.4* 8.2*  MG 2.1 2.2 2.2  --   PHOS 3.0  3.1 3.7  --    Liver Function Tests: Recent Labs  Lab 10/24/22 0450 10/25/22 0450 10/27/22 0433  AST 50* 56* 38  ALT 112* 137* 122*  ALKPHOS 183* 167* 153*  BILITOT 0.3 0.4 0.6  PROT 5.8* 5.3* 5.2*  ALBUMIN 2.6* 2.3* 2.6*   No results for input(s): "LIPASE", "AMYLASE" in the last 168 hours. No results for input(s): "AMMONIA" in the last 168 hours. CBC: Recent Labs  Lab 10/24/22 0450 10/25/22 0450 10/27/22 0433 10/28/22 0435  WBC 15.6* 14.1* 17.0* 14.6*  NEUTROABS 13.8* 12.3* 15.1* 13.3*  HGB 12.8* 13.2 13.3 13.1  HCT 38.2* 39.4 39.7 38.8*  MCV 97.0 98.0 98.3 97.7  PLT 119* 120* 120* 108*   Cardiac Enzymes: No results for input(s): "CKTOTAL", "CKMB", "CKMBINDEX", "TROPONINI" in the last 168 hours. BNP: BNP (last 3 results) No results for input(s): "BNP" in the last 8760 hours.  ProBNP (last 3 results) No results for input(s): "PROBNP" in the last 8760 hours.  CBG: Recent Labs  Lab 10/24/22 1653  GLUCAP 185*       Signed:  Irine Seal MD.  [facility] 10/28/2022, 3:07 PM

## 2022-10-28 NOTE — TOC Progression Note (Addendum)
Transition of Care [facility]) - Progression Note    Patient Details  Name: Jason Lyons MRN: [id number] Date of Birth: 02/23/1956  Transition of Care [facility]) CM/SW [place], LCSW Phone Number: 10/28/2022, 3:13 PM  Clinical Narrative:     Facility reported too late in day for pt to d/c will have bed available tomorrow morning. TOC to follow.  [facility]EN CSW reached out to pt's MD at 10:38 am inquiring about pt's discharge. Per MD "haven't seen him today yet, but hoping for DC today". Pt's d/c summary was placed at 3:07 pm. Facility reported too late for them to accept, no bed available today, can accept in the morning. TOC to follow.   Expected Discharge Plan: [place] Barriers to Discharge: No Barriers Identified  Expected Discharge Plan and Services In-house Referral: Clinical Social Work     Living arrangements for the past 2 months: Single Family Home Expected Discharge Date: 10/28/22                                     Social Determinants of Health (SDOH) Interventions SDOH Screenings   Food Insecurity: No Food Insecurity (10/14/2022)  Housing: Low Risk  (10/14/2022)  Transportation Needs: No Transportation Needs (10/14/2022)  Utilities: Not At Risk (10/14/2022)  Tobacco Use: Medium Risk (10/14/2022)    Readmission Risk Interventions     No data to display

## 2022-10-29 NOTE — Plan of Care (Signed)

## 2022-10-29 NOTE — TOC Transition Note (Signed)
Transition of Care [facility]) - CM/SW Discharge Note   Patient Details  Name: Jason Lyons MRN: [id number] Date of Birth: 22-Dec-1955  Transition of Care [facility]) CM/SW Contact:  Illene Regulus, LCSW Phone Number: 10/29/2022, 9:16 AM   Clinical Narrative:       Final next level of care: Memory Care Barriers to Discharge: No Barriers Identified   Patient Goals and CMS Choice   Choice offered to / list presented to : Patient  Discharge Placement CSW called [name] and informed him of pt's transfer to facility today. PTAR called. TOC sign off.                      Discharge Plan and Services Additional resources added to the After Visit Summary for   In-house Referral: Clinical Social Work                                   Social Determinants of Health (SDOH) Interventions SDOH Screenings   Food Insecurity: No Food Insecurity (10/14/2022)  Housing: Low Risk  (10/14/2022)  Transportation Needs: No Transportation Needs (10/14/2022)  Utilities: Not At Risk (10/14/2022)  Tobacco Use: Medium Risk (10/14/2022)     Readmission Risk Interventions     No data to display

## 2022-10-29 NOTE — Final Progress Note (Signed)
IV removed by Paramedic. Belongings sent with patient. Report called to [place] at [facility]. D/C packet sent with patient. All questions answered regarding patient. Patient safely transported via EMS off unit with foley catheter intact.

## 2022-10-30 ENCOUNTER — Inpatient Hospital Stay (HOSPITAL_COMMUNITY)
Admission: EM | Admit: 2022-10-30 | Discharge: 2022-11-09 | DRG: 699 | Disposition: A | Discharge status: Hospice | Payer: Medicare Other | Attending: Internal Medicine | Admitting: Internal Medicine

## 2022-10-30 ENCOUNTER — Encounter (HOSPITAL_COMMUNITY): Payer: Self-pay

## 2022-10-30 ENCOUNTER — Observation Stay (HOSPITAL_COMMUNITY): Payer: Medicare Other

## 2022-10-30 ENCOUNTER — Emergency Department (HOSPITAL_COMMUNITY): Payer: Medicare Other

## 2022-10-30 DIAGNOSIS — C7951 Secondary malignant neoplasm of bone: Secondary | ICD-10-CM

## 2022-10-30 DIAGNOSIS — L89312 Pressure ulcer of right buttock, stage 2: Secondary | ICD-10-CM | POA: Diagnosis present

## 2022-10-30 DIAGNOSIS — D6959 Other secondary thrombocytopenia: Secondary | ICD-10-CM | POA: Diagnosis present

## 2022-10-30 DIAGNOSIS — R7989 Other specified abnormal findings of blood chemistry: Secondary | ICD-10-CM | POA: Diagnosis not present

## 2022-10-30 DIAGNOSIS — Z6824 Body mass index (BMI) 24.0-24.9, adult: Secondary | ICD-10-CM

## 2022-10-30 DIAGNOSIS — M8448XD Pathological fracture, other site, subsequent encounter for fracture with routine healing: Secondary | ICD-10-CM | POA: Diagnosis present

## 2022-10-30 DIAGNOSIS — T83511A Infection and inflammatory reaction due to indwelling urethral catheter, initial encounter: Secondary | ICD-10-CM | POA: Diagnosis not present

## 2022-10-30 DIAGNOSIS — C221 Intrahepatic bile duct carcinoma: Principal | ICD-10-CM | POA: Diagnosis present

## 2022-10-30 DIAGNOSIS — Z66 Do not resuscitate: Secondary | ICD-10-CM | POA: Diagnosis not present

## 2022-10-30 DIAGNOSIS — N390 Urinary tract infection, site not specified: Secondary | ICD-10-CM

## 2022-10-30 DIAGNOSIS — G8220 Paraplegia, unspecified: Secondary | ICD-10-CM | POA: Diagnosis present

## 2022-10-30 DIAGNOSIS — K5909 Other constipation: Secondary | ICD-10-CM | POA: Diagnosis present

## 2022-10-30 DIAGNOSIS — G9529 Other cord compression: Secondary | ICD-10-CM | POA: Diagnosis present

## 2022-10-30 DIAGNOSIS — B9620 Unspecified Escherichia coli [E. coli] as the cause of diseases classified elsewhere: Secondary | ICD-10-CM | POA: Diagnosis present

## 2022-10-30 DIAGNOSIS — E861 Hypovolemia: Secondary | ICD-10-CM | POA: Diagnosis present

## 2022-10-30 DIAGNOSIS — E860 Dehydration: Secondary | ICD-10-CM | POA: Diagnosis present

## 2022-10-30 DIAGNOSIS — C249 Malignant neoplasm of biliary tract, unspecified: Secondary | ICD-10-CM | POA: Diagnosis present

## 2022-10-30 DIAGNOSIS — Z87891 Personal history of nicotine dependence: Secondary | ICD-10-CM

## 2022-10-30 DIAGNOSIS — E871 Hypo-osmolality and hyponatremia: Secondary | ICD-10-CM | POA: Diagnosis present

## 2022-10-30 DIAGNOSIS — I2699 Other pulmonary embolism without acute cor pulmonale: Secondary | ICD-10-CM | POA: Diagnosis present

## 2022-10-30 DIAGNOSIS — R52 Pain, unspecified: Secondary | ICD-10-CM

## 2022-10-30 DIAGNOSIS — I959 Hypotension, unspecified: Secondary | ICD-10-CM | POA: Diagnosis present

## 2022-10-30 DIAGNOSIS — Z1152 Encounter for screening for COVID-19: Secondary | ICD-10-CM

## 2022-10-30 DIAGNOSIS — D72829 Elevated white blood cell count, unspecified: Secondary | ICD-10-CM | POA: Diagnosis present

## 2022-10-30 DIAGNOSIS — I9589 Other hypotension: Secondary | ICD-10-CM | POA: Diagnosis not present

## 2022-10-30 DIAGNOSIS — Z808 Family history of malignant neoplasm of other organs or systems: Secondary | ICD-10-CM

## 2022-10-30 DIAGNOSIS — L8990 Pressure ulcer of unspecified site, unspecified stage: Secondary | ICD-10-CM | POA: Insufficient documentation

## 2022-10-30 DIAGNOSIS — D696 Thrombocytopenia, unspecified: Secondary | ICD-10-CM | POA: Diagnosis present

## 2022-10-30 DIAGNOSIS — R338 Other retention of urine: Secondary | ICD-10-CM | POA: Diagnosis present

## 2022-10-30 DIAGNOSIS — Z79899 Other long term (current) drug therapy: Secondary | ICD-10-CM

## 2022-10-30 DIAGNOSIS — E785 Hyperlipidemia, unspecified: Secondary | ICD-10-CM | POA: Diagnosis present

## 2022-10-30 DIAGNOSIS — Z515 Encounter for palliative care: Secondary | ICD-10-CM

## 2022-10-30 DIAGNOSIS — R64 Cachexia: Secondary | ICD-10-CM | POA: Diagnosis present

## 2022-10-30 DIAGNOSIS — E876 Hypokalemia: Secondary | ICD-10-CM | POA: Diagnosis present

## 2022-10-30 DIAGNOSIS — Z7901 Long term (current) use of anticoagulants: Secondary | ICD-10-CM

## 2022-10-30 DIAGNOSIS — Y846 Urinary catheterization as the cause of abnormal reaction of the patient, or of later complication, without mention of misadventure at the time of the procedure: Secondary | ICD-10-CM | POA: Diagnosis present

## 2022-10-30 DIAGNOSIS — Z86711 Personal history of pulmonary embolism: Secondary | ICD-10-CM

## 2022-10-30 DIAGNOSIS — E440 Moderate protein-calorie malnutrition: Secondary | ICD-10-CM | POA: Diagnosis present

## 2022-10-30 DIAGNOSIS — R8281 Pyuria: Secondary | ICD-10-CM | POA: Diagnosis present

## 2022-10-30 LAB — LACTIC ACID, PLASMA
Lactic Acid, Venous: 1 mmol/L (ref 0.5–1.9)
Lactic Acid, Venous: 0.9 mmol/L (ref 0.5–1.9)

## 2022-10-30 LAB — CBC WITH DIFFERENTIAL/PLATELET
Abs Immature Granulocytes: 0.13 10*3/uL — ABNORMAL HIGH (ref 0.00–0.07)
Basophils Absolute: 0 10*3/uL (ref 0.0–0.1)
Basophils Relative: 0 %
Eosinophils Absolute: 0 10*3/uL (ref 0.0–0.5)
Eosinophils Relative: 0 %
HCT: 43.1 % (ref 39.0–52.0)
Hemoglobin: 14.6 g/dL (ref 13.0–17.0)
Immature Granulocytes: 1 %
Lymphocytes Relative: 2 %
Lymphs Abs: 0.3 10*3/uL — ABNORMAL LOW (ref 0.7–4.0)
MCH: 32.8 pg (ref 26.0–34.0)
MCHC: 33.9 g/dL (ref 30.0–36.0)
MCV: 96.9 fL (ref 80.0–100.0)
Monocytes Absolute: 0.5 10*3/uL (ref 0.1–1.0)
Monocytes Relative: 3 %
Neutro Abs: 17.4 10*3/uL — ABNORMAL HIGH (ref 1.7–7.7)
Neutrophils Relative %: 94 %
Platelets: 82 10*3/uL — ABNORMAL LOW (ref 150–400)
RBC: 4.45 MIL/uL (ref 4.22–5.81)
RDW: 16.6 % — ABNORMAL HIGH (ref 11.5–15.5)
WBC: 18.4 10*3/uL — ABNORMAL HIGH (ref 4.0–10.5)
nRBC: 0.2 % (ref 0.0–0.2)

## 2022-10-30 LAB — URINALYSIS, ROUTINE W REFLEX MICROSCOPIC
Bilirubin Urine: NEGATIVE
Glucose, UA: NEGATIVE mg/dL
Ketones, ur: NEGATIVE mg/dL
Nitrite: NEGATIVE
Protein, ur: 30 mg/dL — AB
RBC / HPF: >50 RBC/hpf — ABNORMAL HIGH (ref 0–5)
Specific Gravity, Urine: 1.019 (ref 1.005–1.030)
WBC, UA: >50 WBC/hpf — ABNORMAL HIGH (ref 0–5)
pH: 5 (ref 5.0–8.0)

## 2022-10-30 LAB — COMPREHENSIVE METABOLIC PANEL
ALT: 148 U/L — ABNORMAL HIGH (ref 0–44)
AST: 48 U/L — ABNORMAL HIGH (ref 15–41)
Albumin: 2.7 g/dL — ABNORMAL LOW (ref 3.5–5.0)
Alkaline Phosphatase: 184 U/L — ABNORMAL HIGH (ref 38–126)
Anion gap: 8 (ref 5–15)
BUN: 28 mg/dL — ABNORMAL HIGH (ref 8–23)
CO2: 27 mmol/L (ref 22–32)
Calcium: 8 mg/dL — ABNORMAL LOW (ref 8.9–10.3)
Chloride: 97 mmol/L — ABNORMAL LOW (ref 98–111)
Creatinine, Ser: 0.58 mg/dL — ABNORMAL LOW (ref 0.61–1.24)
GFR, Estimated: >60 mL/min (ref >60)
Glucose, Bld: 106 mg/dL — ABNORMAL HIGH (ref 70–99)
Potassium: 3.7 mmol/L (ref 3.5–5.1)
Sodium: 132 mmol/L — ABNORMAL LOW (ref 135–145)
Total Bilirubin: 1 mg/dL (ref 0.3–1.2)
Total Protein: 5.9 g/dL — ABNORMAL LOW (ref 6.5–8.1)

## 2022-10-30 LAB — SODIUM, URINE, RANDOM: Sodium, Ur: 18 mmol/L

## 2022-10-30 LAB — PROTIME-INR
INR: 1.6 — ABNORMAL HIGH (ref 0.8–1.2)
Prothrombin Time: 18.9 seconds — ABNORMAL HIGH (ref 11.4–15.2)

## 2022-10-30 LAB — MAGNESIUM: Magnesium: 1.9 mg/dL (ref 1.7–2.4)

## 2022-10-30 LAB — PHOSPHORUS: Phosphorus: 3.3 mg/dL (ref 2.5–4.6)

## 2022-10-30 LAB — CK: Total CK: 46 U/L — ABNORMAL LOW (ref 49–397)

## 2022-10-30 LAB — APTT: aPTT: 35 seconds (ref 24–36)

## 2022-10-30 LAB — CREATININE, URINE, RANDOM: Creatinine, Urine: 87 mg/dL

## 2022-10-30 LAB — PROCALCITONIN: Procalcitonin: 4.35 ng/mL

## 2022-10-30 MED ORDER — HYDROMORPHONE HCL 2 MG PO TABS: 1.0000 mg | ORAL_TABLET | Freq: Four times a day (QID) | ORAL | Status: DC | PRN

## 2022-10-30 MED ORDER — SODIUM CHLORIDE 0.9 % IV BOLUS
1000.0000 mL | Freq: Once | INTRAVENOUS | Status: AC
  Administered 2022-10-30: 1000 mL via INTRAVENOUS

## 2022-10-30 MED ORDER — DEXAMETHASONE 4 MG PO TABS
4.0000 mg | ORAL_TABLET | Freq: Two times a day (BID) | ORAL | Status: DC
  Administered 2022-10-30 – 2022-10-31 (×2): 4 mg via ORAL
  Filled 2022-10-30 (×2): qty 1

## 2022-10-30 MED ORDER — HYDROMORPHONE HCL 1 MG/ML IJ SOLN
1.0000 mg | Freq: Once | INTRAMUSCULAR | Status: AC
  Administered 2022-10-30: 1 mg via INTRAVENOUS
  Filled 2022-10-30: qty 1

## 2022-10-30 MED ORDER — POLYETHYLENE GLYCOL 3350 17 G PO PACK
17.0000 g | PACK | Freq: Two times a day (BID) | ORAL | Status: DC
  Administered 2022-10-30 – 2022-10-31 (×2): 17 g via ORAL
  Filled 2022-10-30 (×2): qty 1

## 2022-10-30 MED ORDER — ACETAMINOPHEN 650 MG RE SUPP: 650.0000 mg | Freq: Four times a day (QID) | RECTAL | Status: DC | PRN

## 2022-10-30 MED ORDER — SENNOSIDES-DOCUSATE SODIUM 8.6-50 MG PO TABS
1.0000 | ORAL_TABLET | Freq: Two times a day (BID) | ORAL | Status: DC
  Administered 2022-10-30 – 2022-11-09 (×20): 1 via ORAL
  Filled 2022-10-30 (×20): qty 1

## 2022-10-30 MED ORDER — HYDROCORTISONE SOD SUC (PF) 100 MG IJ SOLR
100.0000 mg | Freq: Once | INTRAMUSCULAR | Status: AC
  Administered 2022-10-30: 100 mg via INTRAVENOUS
  Filled 2022-10-30: qty 2

## 2022-10-30 MED ORDER — APIXABAN 5 MG PO TABS
5.0000 mg | ORAL_TABLET | Freq: Two times a day (BID) | ORAL | Status: DC
  Administered 2022-10-30 – 2022-11-09 (×20): 5 mg via ORAL
  Filled 2022-10-30 (×20): qty 1

## 2022-10-30 MED ORDER — PANTOPRAZOLE SODIUM 40 MG PO TBEC
40.0000 mg | DELAYED_RELEASE_TABLET | Freq: Every day | ORAL | Status: DC
  Administered 2022-10-30 – 2022-10-31 (×2): 40 mg via ORAL
  Filled 2022-10-30 (×2): qty 1

## 2022-10-30 MED ORDER — SODIUM CHLORIDE 0.9 % IV SOLN: INTRAVENOUS | Status: AC

## 2022-10-30 MED ORDER — MILK AND MOLASSES ENEMA
1.0000 | Freq: Once | RECTAL | Status: AC
  Administered 2022-10-31: 240 mL via RECTAL
  Filled 2022-10-30: qty 240

## 2022-10-30 MED ORDER — SODIUM CHLORIDE 0.9 % IV BOLUS
500.0000 mL | Freq: Once | INTRAVENOUS | Status: AC
  Administered 2022-10-30: 500 mL via INTRAVENOUS

## 2022-10-30 MED ORDER — ACETAMINOPHEN 325 MG PO TABS
650.0000 mg | ORAL_TABLET | Freq: Four times a day (QID) | ORAL | Status: DC | PRN
  Filled 2022-10-30: qty 2

## 2022-10-30 MED ORDER — THIAMINE HCL 100 MG/ML IJ SOLN
100.0000 mg | Freq: Every day | INTRAMUSCULAR | Status: DC
  Administered 2022-10-30 – 2022-11-01 (×3): 100 mg via INTRAVENOUS
  Filled 2022-10-30 (×3): qty 2

## 2022-10-30 MED ORDER — BISACODYL 10 MG RE SUPP: 10.0000 mg | Freq: Every day | RECTAL | Status: DC | PRN

## 2022-10-30 MED ORDER — SODIUM CHLORIDE 0.9 % IV SOLN
1.0000 g | Freq: Once | INTRAVENOUS | Status: AC
  Administered 2022-10-30: 1 g via INTRAVENOUS
  Filled 2022-10-30: qty 10

## 2022-10-30 MED ORDER — SODIUM CHLORIDE 0.9 % IV SOLN
2.0000 g | INTRAVENOUS | Status: DC
  Administered 2022-10-31 – 2022-11-05 (×6): 2 g via INTRAVENOUS
  Filled 2022-10-30 (×6): qty 20

## 2022-10-30 MED ORDER — FENTANYL CITRATE PF 50 MCG/ML IJ SOSY
25.0000 ug | PREFILLED_SYRINGE | INTRAMUSCULAR | Status: DC | PRN
  Administered 2022-10-31: 50 ug via INTRAVENOUS
  Filled 2022-10-30: qty 1

## 2022-10-30 MED ORDER — ONDANSETRON HCL 4 MG/2ML IJ SOLN
4.0000 mg | Freq: Once | INTRAMUSCULAR | Status: AC
  Administered 2022-10-30: 4 mg via INTRAVENOUS
  Filled 2022-10-30: qty 2

## 2022-10-30 MED ORDER — METHOCARBAMOL 1000 MG/10ML IJ SOLN: 500.0000 mg | Freq: Four times a day (QID) | INTRAVENOUS | Status: DC | PRN

## 2022-10-30 NOTE — ED Triage Notes (Signed)
Pt arrived via EMS, from [facility], was d/c there 1/19 and pain medications were not available at facility. Pt arrived for pain management, no acute complaints.

## 2022-10-30 NOTE — Assessment & Plan Note (Signed)
Patient is status post Foley catheter.  Urine culture pending already started on Rocephin in the emergency department in the setting of hypotension and rise in white blood cell count although I suspect this is mostly secondary to dehydration. For tonight continue antibiotics while awaiting results of blood cultures and urine cultures

## 2022-10-30 NOTE — Assessment & Plan Note (Addendum)
Seems to be in the setting of dehydration.  Will rehydrate Follow fluid status follow strict I's and O's  Holding parameters on po dilaudid Would benefit from palliative care consult Also it seem pt was not given his decadron at the SNF that could have contributed to hypotension  Will restart decadron tonight  And give 1 dose of stress dose steroids

## 2022-10-30 NOTE — Subjective & Objective (Signed)
Pt with known adenocarcinoma of the biliary tract diagnosed in 05/13/2021 followed by Dr. Alvy Bimler on chemotherapy with cisplatin and gemcitabine  But now he os followed at [place] pathological fracture T4 status post XRT  was seen in the [facility] ED and 09/30/2019 because of pain in the back as well as numbness of bilateral hands and feet.  He was given oxycodone and was discharged home and was noted to not be able to pass stool for the last 3 weeks but still had control of his urine.  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  Neurosurgery was consulted and did not feel the patient was an operative candidate and he was given Decadron and transferred over to [facility] for consideration of radiation therapy which would be palliative.  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 He was treated with radiation , decadron and pain management Pt was having urinary retention and had Foley Catheter inserted After completing his radiation therapy on 1/16 he was discahrged to SNF on 1/18 of January Radiation oncology has recommended tapering of the Decadron  He has known LFT elevations and RUQ [place] was done showing "Heterogeneous nodular lesions in the liver measuring up to 3.6 cm and 2.9 cm, suspicious for metastatic disease,   At the time of discharge patient continued to have intermittent chest pain radiating to his back which was felt to be noncardiac His pain regimen included oxycodone IR 5 mg every 4 hours for moderate pain and Dilaudid 1 mg every 4 hours for severe pain while at [facility] at the discharge patient was prescribed Dilaudid 1 mg every 6 hours as needed for pain He was brought back today from [place] because they did not have pain medication

## 2022-10-30 NOTE — Assessment & Plan Note (Signed)
Continue Foley catheter this is chronic in the setting of T6 metastases

## 2022-10-30 NOTE — Assessment & Plan Note (Signed)
Continue Eliquis at 5 mg p.o. twice daily

## 2022-10-30 NOTE — H&P (Signed)
Riely Baskett YQM:[id number] DOB: 1956/03/31 DOA: 10/30/2022     PCP: Karle Starch,    Outpatient Specialists: [place]   [facility] Radiation oncology Dr. Lisbeth Renshaw Patient arrived to ER on 10/30/22 at 1809 Referred by Attending Isla Pence, MD   Patient coming from:     From facility [facility]  Chief Complaint:   Chief Complaint  Patient presents with   Back Pain    HPI: Akai Dollard is a 67 y.o. male with medical history significant of cholangiocarcinoma, metastatic to bone .  Followed at [facility], metastatic disease to T4 and T6 resulting in paraplegia with cord compression at T6 level and encroachment on the thecal sac, urinary retention status post Foley catheter malnutrition and thrombocytopenia history of PE on Eliquis    Presented with   not controlled back pain Pt with known adenocarcinoma of the biliary tract diagnosed in 05/13/2021 followed by Dr. Alvy Bimler on chemotherapy with cisplatin and gemcitabine  But now he os followed at [facility] pathological fracture T4 status post XRT  was seen in the [facility] ED and 09/30/2019 because of pain in the back as well as numbness of bilateral hands and feet.  He was given oxycodone and was discharged home and was noted to not be able to pass stool for the last 3 weeks but still had control of his urine.  We was readmitted 10/12/2022 at [facility] with right-sided chest pain that radiated to the back and was found to have a T6 spinal metastasis as well as new scattered pulmonary nodules.  Neurosurgery was consulted and did not feel the patient was an operative candidate and he was given Decadron and transferred over to [facility] for consideration of radiation therapy which would be palliative.  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 He was treated with radiation , decadron and pain management Pt was having urinary retention  and had Foley Catheter inserted After completing his radiation therapy on 1/16 he was discahrged to SNF on 1/18 of January Radiation oncology has recommended tapering of the Decadron  He has known LFT elevations and RUQ [place] was done showing "Heterogeneous nodular lesions in the liver measuring up to 3.6 cm and 2.9 cm, suspicious for metastatic disease,   At the time of discharge patient continued to have intermittent chest pain radiating to his back which was felt to be noncardiac His pain regimen included oxycodone IR 5 mg every 4 hours for moderate pain and Dilaudid 1 mg every 4 hours for severe pain while at [facility] at the discharge patient was prescribed Dilaudid 1 mg every 6 hours as needed for pain He was brought back today from [place] because they did not have pain medication      Regarding pertinent Chronic problems:     Hyperlipidemia -  not on statins   Lipid Panel     Component Value Date/Time   CHOL 268 (H) 04/28/2018 1054   TRIG 200 (H) 04/28/2018 1054   HDL 40 04/28/2018 1054   CHOLHDL 6.7 (H) 04/28/2018 1054   LDLCALC 188 (H) 04/28/2018 1054   LABVLDL 40 04/28/2018 1054       While in ER:  Noted mildly hypotensive  UA showing white and red blood cells with only few bacteria Noted to have elevated white blood cell count at 18 up from 14 at the time of  discharge Sodium 132 down from 133 at the time of discharge  SNF states they are unsure when will be able to get dilaudid  Pt hypotensive on arrival in 80's  Appears fluid down UA with wight and red cell but only few bacteria Indwelling catheter Given possibility of sepsis started on rocephin in ER   CXR -  NON acute    Following Medications were ordered in ER: Medications  cefTRIAXone (ROCEPHIN) 1 g in sodium chloride 0.9 % 100 mL IVPB (has no administration in time range)  sodium chloride 0.9 % bolus 1,000 mL (has no administration in time range)  sodium chloride 0.9 % bolus 1,000 mL  (1,000 mLs Intravenous New Bag/Given 10/30/22 1906)  HYDROmorphone (DILAUDID) injection 1 mg (1 mg Intravenous Given 10/30/22 1901)  ondansetron (ZOFRAN) injection 4 mg (4 mg Intravenous Given 10/30/22 1901)  HYDROmorphone (DILAUDID) injection 1 mg (1 mg Intravenous Given 10/30/22 1950)    ___________    ED Triage Vitals  Enc Vitals Group     BP 10/30/22 1826 (!) 81/68     Pulse Rate 10/30/22 1826 69     Resp 10/30/22 1826 18     Temp 10/30/22 1826 (!) 97.5 F (36.4 C)     Temp Source 10/30/22 1826 Oral     SpO2 10/30/22 1826 96 %     Weight --      Height --      Head Circumference --      Peak Flow --      Pain Score 10/30/22 1927 9     Pain Loc --      Pain Edu? --      Excl. in [place]? --   TMAX(24)@     _________________________________________ Significant initial  Findings: Abnormal Labs Reviewed  COMPREHENSIVE METABOLIC PANEL - Abnormal; Notable for the following components:      Result Value   Sodium 132 (*)    Chloride 97 (*)    Glucose, Bld 106 (*)    BUN 28 (*)    Creatinine, Ser 0.58 (*)    Calcium 8.0 (*)    Total Protein 5.9 (*)    Albumin 2.7 (*)    AST 48 (*)    ALT 148 (*)    Alkaline Phosphatase 184 (*)    All other components within normal limits  CBC WITH DIFFERENTIAL/PLATELET - Abnormal; Notable for the following components:   WBC 18.4 (*)    RDW 16.6 (*)    Platelets 82 (*)    Neutro Abs 17.4 (*)    Lymphs Abs 0.3 (*)    Abs Immature Granulocytes 0.13 (*)    All other components within normal limits  URINALYSIS, ROUTINE W REFLEX MICROSCOPIC - Abnormal; Notable for the following components:   Color, Urine AMBER (*)    APPearance CLOUDY (*)    Hgb urine dipstick LARGE (*)    Protein, ur 30 (*)    Leukocytes,Ua LARGE (*)    RBC / HPF >50 (*)    WBC, UA >50 (*)    Bacteria, UA FEW (*)    All other components within normal limits     ECG: Ordered      The recent clinical data is shown below. Vitals:   10/30/22 1826 10/30/22 1830 10/30/22  2030  BP: (!) '81/68 95/60 96/67 '$  Pulse: 69  70  Resp: 18  11  Temp: (!) 97.5 F (36.4 C)    TempSrc: Oral    SpO2: 96%  98%    WBC     Component Value Date/Time   WBC 18.4 (H) 10/30/2022 1900   LYMPHSABS 0.3 (L) 10/30/2022 1900   MONOABS 0.5 10/30/2022 1900   EOSABS 0.0 10/30/2022 1900   BASOSABS 0.0 10/30/2022 1900     Lactic Acid, Venous No results found for: "LATICACIDVEN"   Procalcitonin   Ordered    UA     Urine analysis:    Component Value Date/Time   COLORURINE AMBER (A) 10/30/2022 1933   APPEARANCEUR CLOUDY (A) 10/30/2022 1933   LABSPEC 1.019 10/30/2022 1933   PHURINE 5.0 10/30/2022 1933   GLUCOSEU NEGATIVE 10/30/2022 1933   HGBUR LARGE (A) 10/30/2022 1933   BILIRUBINUR NEGATIVE 10/30/2022 1933   KETONESUR NEGATIVE 10/30/2022 1933   PROTEINUR 30 (A) 10/30/2022 1933   NITRITE NEGATIVE 10/30/2022 1933   LEUKOCYTESUR LARGE (A) 10/30/2022 1933     _______________________________________________ Hospitalist was called for admission for   Cholangiocarcinoma metastatic to bone  pyuria, hypotension, hyponatremia   The following Work up has been ordered so far:  Orders Placed This Encounter  Procedures   Urine Culture   Culture, blood (routine x 2)   DG Chest Portable 1 View   Comprehensive metabolic panel   CBC with Differential   Urinalysis, Routine w reflex microscopic   Lactic acid, plasma   Consult to hospitalist   ED EKG     OTHER Significant initial  Findings:  labs showing:    Recent Labs  Lab 10/24/22 0450 10/25/22 0450 10/27/22 0433 10/28/22 0435 10/30/22 1900  NA 137 137 136 133* 132*  K 4.4 4.2 4.5 4.4 3.7  CO2 '27 27 28 27 27  '$ GLUCOSE 134* 148* 129* 149* 106*  BUN 32* 29* 35* 33* 28*  CREATININE 0.56* 0.53* 0.63 0.55* 0.58*  CALCIUM 8.9 8.8* 8.4* 8.2* 8.0*  MG 2.1 2.2 2.2  --   --   PHOS 3.0 3.1 3.7  --   --     Cr   stable,   Lab Results  Component Value Date   CREATININE 0.58 (L) 10/30/2022   CREATININE 0.55 (L)  10/28/2022   CREATININE 0.63 10/27/2022    Recent Labs  Lab 10/24/22 0450 10/25/22 0450 10/27/22 0433 10/30/22 1900  AST 50* 56* 38 48*  ALT 112* 137* 122* 148*  ALKPHOS 183* 167* 153* 184*  BILITOT 0.3 0.4 0.6 1.0  PROT 5.8* 5.3* 5.2* 5.9*  ALBUMIN 2.6* 2.3* 2.6* 2.7*   Lab Results  Component Value Date   CALCIUM 8.0 (L) 10/30/2022   PHOS 3.7 10/27/2022    Plt: Lab Results  Component Value Date   PLT 82 (L) 10/30/2022        Recent Labs  Lab 10/24/22 0450 10/25/22 0450 10/27/22 0433 10/28/22 0435 10/30/22 1900  WBC 15.6* 14.1* 17.0* 14.6* 18.4*  NEUTROABS 13.8* 12.3* 15.1* 13.3* 17.4*  HGB 12.8* 13.2 13.3 13.1 14.6  HCT 38.2* 39.4 39.7 38.8* 43.1  MCV 97.0 98.0 98.3 97.7 96.9  PLT 119* 120* 120* 108* 82*    HG/HCT  stable,     Component Value Date/Time   HGB 14.6 10/30/2022 1900   HGB 12.9 (L) 10/16/2021 1340   HGB 16.5 04/28/2018 1054   HCT 43.1 10/30/2022 1900   HCT 48.3 04/28/2018 1054   MCV 96.9 10/30/2022 1900   MCV 92 04/28/2018 1054     Cardiac Panel (last 3 results) No results for input(s): "CKTOTAL", "CKMB", "TROPONINI", "RELINDX" in the last 72 hours.  Cultures: No results found for: "SDES", "SPECREQUEST", "CULT", "REPTSTATUS"   Radiological Exams on Admission: DG Chest Portable 1 View  Result Date: 10/30/2022 CLINICAL DATA:  Hypotension EXAM: PORTABLE CHEST 1 VIEW COMPARISON:  Portable exam 2042 hours compared to 10/12/2022 FINDINGS: RIGHT jugular Port-A-Cath with tip projecting over SVC. Normal heart size, mediastinal contours, and pulmonary vascularity. Minimal bibasilar atelectasis. Lungs otherwise clear. No pulmonary infiltrate, pleural effusion, or pneumothorax. No acute osseous findings. IMPRESSION: Minimal bibasilar atelectasis. Electronically Signed   By: Lavonia Dana M.D.   On: 10/30/2022 20:50   _______________________________________________________________________________________________________ Latest  Blood pressure  96/67, pulse 70, temperature (!) 97.5 F (36.4 C), temperature source Oral, resp. rate 11, SpO2 98 %.   Vitals  labs and radiology finding personally reviewed  Review of Systems:    Pertinent positives include:   fatigue, constipation  Constitutional:  No weight loss, night sweats, Fevers, chills, weight loss  HEENT:  No headaches, Difficulty swallowing,Tooth/dental problems,Sore throat,  No sneezing, itching, ear ache, nasal congestion, post nasal drip,  Cardio-vascular:  No chest pain, Orthopnea, PND, anasarca, dizziness, palpitations.no Bilateral lower extremity swelling  GI:  No heartburn, indigestion, abdominal pain, nausea, vomiting, diarrhea, change in bowel habits, loss of appetite, melena, blood in stool, hematemesis Resp:  no shortness of breath at rest. No dyspnea on exertion, No excess mucus, no productive cough, No non-productive cough, No coughing up of blood.No change in color of mucus.No wheezing. Skin:  no rash or lesions. No jaundice GU:  no dysuria, change in color of urine, no urgency or frequency. No straining to urinate.  No flank pain.  Musculoskeletal:  No joint pain or no joint swelling. No decreased range of motion. No back pain.  Psych:  No change in mood or affect. No depression or anxiety. No memory loss.  Neuro: no localizing neurological complaints, no tingling, no weakness, no double vision, no gait abnormality, no slurred speech, no confusion  All systems reviewed and apart from [place] all are negative _______________________________________________________________________________________________ Past Medical History:  History reviewed. No pertinent past medical history.    Past Surgical History:  Procedure Laterality Date   IR IMAGING GUIDED PORT INSERTION  05/19/2021    Social History:  Ambulatory   bed bound     reports that he quit smoking about 17 months ago. His smoking use included cigarettes. He has a 20.00 pack-year smoking history.  He has been exposed to tobacco smoke. He has never used smokeless tobacco. He reports current alcohol use of about 1.0 - 2.0 standard drink of alcohol per week. He reports that he does not use drugs.     Family History:  Family History  Problem Relation Age of Onset   Cancer Father        throat cancer   ______________________________________________________________________________________________ Allergies: No Known Allergies   Prior to Admission medications   Medication Sig Start Date End Date Taking? Authorizing Provider  apixaban (ELIQUIS) 5 MG TABS tablet Take 1 tablet by mouth 2 times daily. 10/28/22   Eugenie Filler, MD  cholecalciferol (VITAMIN D3) 25 MCG (1000 UNIT) tablet Take 1,000 Units by mouth daily. Patient not taking: Reported on 10/13/2022    [provider]  dexamethasone (DECADRON) 2 MG tablet Take 2 tablets (4 mg total) by mouth 2 (two) times daily for 7 days, THEN 1 tablet (2 mg total) 2 (two) times daily for 7 days, THEN 1 tablet (2 mg total) daily for 7 days, THEN 1 tablet (2 mg total) every other day  for 7 days. 10/28/22 11/25/22  Eugenie Filler, MD  feeding supplement (ENSURE ENLIVE / ENSURE PLUS) LIQD Take 237 mLs by mouth 3 (three) times daily between meals. 10/28/22   Eugenie Filler, MD  HYDROmorphone (DILAUDID) 2 MG tablet Take 0.5 tablets (1 mg total) by mouth every 6 (six) hours as needed for severe pain. 10/28/22   Eugenie Filler, MD  pantoprazole (PROTONIX) 40 MG tablet Take 1 tablet (40 mg total) by mouth daily. 10/28/22   Eugenie Filler, MD  polyethylene glycol (MIRALAX / GLYCOLAX) 17 g packet Take 17 g by mouth 2 (two) times daily. 10/28/22   Eugenie Filler, MD  senna-docusate (SENOKOT-S) 8.6-50 MG tablet Take 1 tablet by mouth 2 (two) times daily. 10/28/22   Eugenie Filler, MD  prochlorperazine (COMPAZINE) 10 MG tablet Take 1 tablet by mouth every 6 hours as needed (Nausea or vomiting). 05/21/21 11/01/21  Heath Lark, MD     ___________________________________________________________________________________________________ Physical Exam:    10/30/2022    8:30 PM 10/30/2022    6:30 PM 10/30/2022    6:26 PM  Vitals with BMI  Systolic 96 95 81  Diastolic 67 60 68  Pulse 70  69     1. General:  in No  Acute distres   Chronically ill  cachectic   -appearing 2. Psychological: Alert and   Oriented 3. Head/ENT:    Dry Mucous Membranes                          Head Non traumatic, neck supple                           Poor Dentition 4. SKIN:  decreased Skin turgor,  Skin clean Dry and intact no rash 5. Heart: Regular rate and rhythm no  Murmur, no Rub or gallop 6. Lungs:  Clear to auscultation bilaterally, no wheezes or crackles   7. Abdomen: Soft,  non-tender, Non distended  bowel sounds decreased 8. Lower extremities: no clubbing, cyanosis, no  edema 9. Neurologically decreased strength in bilateral lower ext  10. MSK: Normal range of motion    Chart has been reviewed  ______________________________________________________________________________________________  Assessment/Plan 67 y.o. male with medical history significant of cholangiocarcinoma, metastatic to bone .  Followed at [facility], metastatic disease to T4 and T6 resulting in paraplegia with cord compression at T6 level and encroachment on the thecal sac, urinary retention status post Foley catheter malnutrition and thrombocytopenia history of PE on Eliquis  Admitted for   Cholangiocarcinoma metastatic to bone  pyuria, hyponatremia and hypotension      Present on Admission:  Hypotension  Pulmonary embolism (HCC)  Cancer of biliary tract (HCC)  Elevated LFTs  Other constipation  Paraplegia (HCC)  Malnutrition of moderate degree  Acute urinary retention  Leukocytosis  Pyuria  Hyponatremia  Thrombocytopenia (HCC)     Hypotension Seems to be in the setting of dehydration.  Will rehydrate Follow fluid status follow strict I's and  O's  Holding parameters on po dilaudid Would benefit from palliative care consult Also it seem pt was not given his decadron at the SNF that could have contributed to hypotension  Will restart decadron tonight  And give 1 dose of stress dose steroids   Pulmonary embolism (HCC) Continue Eliquis at 5 mg p.o. twice daily  Cancer of biliary tract [facility]) Patient currently followed at [facility] the plan was for patient to  be discharged to SNF when pain medications available and then follow-up at [facility]  Elevated LFTs Chronic stable continue to monitor  Other constipation Order KUB to further evaluate degree of obstipation at this time bowel regimen  Paraplegia ([place]) Secondary to T6 disease.  Continue Decadron at 4 mg p.o. twice daily see taper Continue pain management PT OT support During prior admission neurosurgery felt patient is not a candidate for intervention.  Patient just finished radiation therapy  Malnutrition of moderate degree Will have nutritional consult evaluate order prealbumin  Acute urinary retention Continue Foley catheter this is chronic in the setting of T6 metastases  Leukocytosis In the setting of recent steroid use.  Pyuria Patient is status post Foley catheter.  Urine culture pending already started on Rocephin in the emergency department in the setting of hypotension and rise in white blood cell count although I suspect this is mostly secondary to dehydration. For tonight continue antibiotics while awaiting results of blood cultures and urine cultures  Hyponatremia Mild in the setting of decreased p.o. intake.  Rehydrate obtain urine electrolytes check TSH Patient currently on steroids  Thrombocytopenia (HCC) Chronic in the setting of liver involvement and chemotherapy.  Continue to monitor at this point no evidence of bleeding   Other plan as per orders.  DVT prophylaxis:  eliquis     Code Status:    Code Status: Prior FULL CODE   as per patient    I had personally discussed CODE STATUS with patient and    Family Communication:   Family not at  Bedside    Disposition Plan:                            Back to current facility when stable                             Following barriers for discharge:                            Electrolytes corrected                                                          Pain controlled with PO medications                                 able to transition to PO antibiotics                             Will need to be able to tolerate PO                                                  Would benefit from PT/OT eval prior to DC  Ordered                                   Transition of care  consulted                   Nutrition    consulted                                  Palliative care    consulted                                       Consults called: none  Admission status:  ED Disposition     ED Disposition  Admit   Condition  --   [place]: [place] [[id number]]  Level of Care: Progressive [102]  Admit to Progressive based on following criteria: MULTISYSTEM THREATS such as stable sepsis, metabolic/electrolyte imbalance with or without encephalopathy that is responding to early treatment.  May place patient in observation at [facility] or [place] if equivalent level of care is available:: No  Covid Evaluation: Asymptomatic - no recent exposure (last 10 days) testing not required  Diagnosis: Hypotension [[id number]]  Admitting Physician: Toy Baker [[id number]]  Attending Physician: Toy Baker [[id number]]           Obs    Level of care         progressive tele indefinitely please discontinue once patient no longer qualifies COVID-19 Labs   Mak Bonny 10/30/2022, 10:16 PM    [facility]     after 2 AM please page floor coverage PA If 7AM-7PM, please contact the day team taking care of the patient using [email or web address]    Patient was evaluated in the context of the global COVID-19 pandemic, which necessitated consideration that the patient might be at risk for infection with the SARS-CoV-2 virus that causes COVID-19. Institutional protocols and algorithms that pertain to the evaluation of patients at risk for COVID-19 are in a state of rapid change based on information released by regulatory bodies including the CDC and federal and state organizations. These policies and algorithms were followed during the patient's care.

## 2022-10-30 NOTE — Assessment & Plan Note (Signed)
Chronic in the setting of liver involvement and chemotherapy.  Continue to monitor at this point no evidence of bleeding

## 2022-10-30 NOTE — Assessment & Plan Note (Signed)
Order KUB to further evaluate degree of obstipation at this time bowel regimen

## 2022-10-30 NOTE — Assessment & Plan Note (Signed)
In the setting of recent steroid use.

## 2022-10-30 NOTE — Assessment & Plan Note (Addendum)
Secondary to T6 disease.  Continue Decadron at 4 mg p.o. twice daily see taper Continue pain management PT OT support During prior admission neurosurgery felt patient is not a candidate for intervention.  Patient just finished radiation therapy

## 2022-10-30 NOTE — Assessment & Plan Note (Signed)
Mild in the setting of decreased p.o. intake.  Rehydrate obtain urine electrolytes check TSH Patient currently on steroids

## 2022-10-30 NOTE — ED Provider Notes (Signed)
[facility] Provider Note   CSN: [id number] Arrival date & time: 10/30/22  1809     History  Chief Complaint  Patient presents with   Back Pain    Jason Lyons is a 67 y.o. male.  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  He was admitted from 1/2-1/19 for new paraplegia.  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  He was seen by NS and was determined to not be a surgical candidate.  He has been on decadron and has been getting palliative radiation.  Pt was d/c to SNF on 1/18 in the evening.  The facility did not have the pt's pain medication, so he's not been getting the prescribed oxy ir and dilaudid.  Pt had a foley placed while here for urinary retention.  This is in place currently.  EMS was called today because pt has severe back pain.     Due to language barrier, an interpreter was present during the history-taking and subsequent discussion (and for part of the physical exam) with this patient.         Home Medications Prior to Admission medications   Medication Sig Start Date End Date Taking? Authorizing Provider  apixaban (ELIQUIS) 5 MG TABS tablet Take 1 tablet by mouth 2 times daily. 10/28/22   Eugenie Filler, MD  cholecalciferol (VITAMIN D3) 25 MCG (1000 UNIT) tablet Take 1,000 Units by mouth daily. Patient not taking: Reported on 10/13/2022    [provider]  dexamethasone (DECADRON) 2 MG tablet Take 2 tablets (4 mg total) by mouth 2 (two) times daily for 7 days, THEN 1 tablet (2 mg total) 2 (two) times daily for 7 days, THEN 1 tablet (2 mg total) daily for 7 days, THEN 1 tablet (2 mg total) every other day for 7 days. 10/28/22 11/25/22  Eugenie Filler, MD  feeding supplement (ENSURE ENLIVE / ENSURE PLUS) LIQD Take 237 mLs by mouth 3 (three) times daily between meals. 10/28/22   Eugenie Filler, MD   HYDROmorphone (DILAUDID) 2 MG tablet Take 0.5 tablets (1 mg total) by mouth every 6 (six) hours as needed for severe pain. 10/28/22   Eugenie Filler, MD  pantoprazole (PROTONIX) 40 MG tablet Take 1 tablet (40 mg total) by mouth daily. 10/28/22   Eugenie Filler, MD  polyethylene glycol (MIRALAX / GLYCOLAX) 17 g packet Take 17 g by mouth 2 (two) times daily. 10/28/22   Eugenie Filler, MD  senna-docusate (SENOKOT-S) 8.6-50 MG tablet Take 1 tablet by mouth 2 (two) times daily. 10/28/22   Eugenie Filler, MD  prochlorperazine (COMPAZINE) 10 MG tablet Take 1 tablet by mouth every 6 hours as needed (Nausea or vomiting). 05/21/21 11/01/21  Heath Lark, MD      Allergies    Patient has no known allergies.    Review of Systems   Review of Systems  Musculoskeletal:  Positive for back pain.  All other systems reviewed and are negative.   Physical Exam Updated Vital Signs BP 96/67   Pulse 70   Temp (!) 97.5 F (36.4 C) (Oral)   Resp 11   SpO2 98%  Physical Exam Vitals and nursing note reviewed.  Constitutional:      Appearance: Normal appearance.  HENT:     Head: Normocephalic and atraumatic.     Right Ear:  External ear normal.     Left Ear: External ear normal.     Nose: Nose normal.     Mouth/Throat:     Mouth: Mucous membranes are dry.  Eyes:     Extraocular Movements: Extraocular movements intact.     Conjunctiva/sclera: Conjunctivae normal.     Pupils: Pupils are equal, round, and reactive to light.  Cardiovascular:     Rate and Rhythm: Normal rate and regular rhythm.     Pulses: Normal pulses.     Heart sounds: Normal heart sounds.  Pulmonary:     Effort: Pulmonary effort is normal.     Breath sounds: Normal breath sounds.  Abdominal:     General: Abdomen is flat. Bowel sounds are normal.     Palpations: Abdomen is soft.  Genitourinary:    Comments: Foley catheter noted Musculoskeletal:     Cervical back: Normal range of motion and neck supple.     Comments:  Weakness BLE  Skin:    General: Skin is warm.     Capillary Refill: Capillary refill takes less than 2 seconds.  Neurological:     Mental Status: He is alert and oriented to person, place, and time.     Comments: Weakness BLE  Psychiatric:        Mood and Affect: Mood normal.     ED Results / Procedures / Treatments   Labs (all labs ordered are listed, but only abnormal results are displayed) Labs Reviewed  COMPREHENSIVE METABOLIC PANEL - Abnormal; Notable for the following components:      Result Value   Sodium 132 (*)    Chloride 97 (*)    Glucose, Bld 106 (*)    BUN 28 (*)    Creatinine, Ser 0.58 (*)    Calcium 8.0 (*)    Total Protein 5.9 (*)    Albumin 2.7 (*)    AST 48 (*)    ALT 148 (*)    Alkaline Phosphatase 184 (*)    All other components within normal limits  CBC WITH DIFFERENTIAL/PLATELET - Abnormal; Notable for the following components:   WBC 18.4 (*)    RDW 16.6 (*)    Platelets 82 (*)    Neutro Abs 17.4 (*)    Lymphs Abs 0.3 (*)    Abs Immature Granulocytes 0.13 (*)    All other components within normal limits  URINALYSIS, ROUTINE W REFLEX MICROSCOPIC - Abnormal; Notable for the following components:   Color, Urine AMBER (*)    APPearance CLOUDY (*)    Hgb urine dipstick LARGE (*)    Protein, ur 30 (*)    Leukocytes,Ua LARGE (*)    RBC / HPF >50 (*)    WBC, UA >50 (*)    Bacteria, UA FEW (*)    All other components within normal limits  URINE CULTURE  CULTURE, BLOOD (ROUTINE X 2)  CULTURE, BLOOD (ROUTINE X 2)  LACTIC ACID, PLASMA  LACTIC ACID, PLASMA  CK  CREATININE, URINE, RANDOM  LACTIC ACID, PLASMA  LACTIC ACID, PLASMA  MAGNESIUM  OSMOLALITY  OSMOLALITY, URINE  PHOSPHORUS  PREALBUMIN  PROTIME-INR  SODIUM, URINE, RANDOM  TSH  COMPREHENSIVE METABOLIC PANEL  CBC  MAGNESIUM  PHOSPHORUS  APTT  PROCALCITONIN    EKG None  Radiology DG Chest Portable 1 View  Result Date: 10/30/2022 CLINICAL DATA:  Hypotension EXAM: PORTABLE  CHEST 1 VIEW COMPARISON:  Portable exam 2042 hours compared to 10/12/2022 FINDINGS: RIGHT jugular Port-A-Cath with tip projecting over SVC.  Normal heart size, mediastinal contours, and pulmonary vascularity. Minimal bibasilar atelectasis. Lungs otherwise clear. No pulmonary infiltrate, pleural effusion, or pneumothorax. No acute osseous findings. IMPRESSION: Minimal bibasilar atelectasis. Electronically Signed   By: Lavonia Dana M.D.   On: 10/30/2022 20:50    Procedures Procedures    Medications Ordered in ED Medications  cefTRIAXone (ROCEPHIN) 1 g in sodium chloride 0.9 % 100 mL IVPB (1 g Intravenous New Bag/Given 10/30/22 2107)  sodium chloride 0.9 % bolus 500 mL (has no administration in time range)  apixaban (ELIQUIS) tablet 5 mg (has no administration in time range)  dexamethasone (DECADRON) tablet 4 mg (has no administration in time range)  HYDROmorphone (DILAUDID) tablet 1 mg (has no administration in time range)  pantoprazole (PROTONIX) EC tablet 40 mg (has no administration in time range)  polyethylene glycol (MIRALAX / GLYCOLAX) packet 17 g (has no administration in time range)  senna-docusate (Senokot-S) tablet 1 tablet (has no administration in time range)  0.9 %  sodium chloride infusion (has no administration in time range)  acetaminophen (TYLENOL) tablet 650 mg (has no administration in time range)    Or  acetaminophen (TYLENOL) suppository 650 mg (has no administration in time range)  methocarbamol (ROBAXIN) 500 mg in dextrose 5 % 50 mL IVPB (has no administration in time range)  bisacodyl (DULCOLAX) suppository 10 mg (has no administration in time range)  thiamine (VITAMIN B1) injection 100 mg (has no administration in time range)  cefTRIAXone (ROCEPHIN) 2 g in sodium chloride 0.9 % 100 mL IVPB (has no administration in time range)  sodium chloride 0.9 % bolus 1,000 mL (1,000 mLs Intravenous New Bag/Given 10/30/22 1906)  HYDROmorphone (DILAUDID) injection 1 mg (1 mg Intravenous  Given 10/30/22 1901)  ondansetron (ZOFRAN) injection 4 mg (4 mg Intravenous Given 10/30/22 1901)  HYDROmorphone (DILAUDID) injection 1 mg (1 mg Intravenous Given 10/30/22 1950)  sodium chloride 0.9 % bolus 1,000 mL (0 mLs Intravenous Stopped 10/30/22 2108)    ED Course/ Medical Decision Making/ A&P                             Medical Decision Making Amount and/or Complexity of Data Reviewed Labs: ordered. Radiology: ordered.  Risk Prescription drug management. Decision regarding hospitalization.   This patient presents to the ED for concern of back pain, this involves an extensive number of treatment options, and is a complaint that carries with it a high risk of complications and morbidity.  The differential diagnosis includes bony met pain, uti/pyelo, electrolyte abn   Co morbidities that complicate the patient evaluation  metastatic cholangiocarcinoma and PE   Additional history obtained:  Additional history obtained from epic chart review External records from outside source obtained and reviewed including EMS report   Lab Tests:  I Ordered, and personally interpreted labs.  The pertinent results include:  cbc with wbc elevated at 18.4 (wbc 14.6 2 days ago); UA + for UTI; cmp with cmp 132 (stable), and mild lft elevations which are also stable.   Imaging Studies ordered:  I ordered imaging studies including cxr  I independently visualized and interpreted imaging which showed Minimal bibasilar atelectasis.  I agree with the radiologist interpretation   Cardiac Monitoring:  The patient was maintained on a cardiac monitor.  I personally viewed and interpreted the cardiac monitored which showed an underlying rhythm of: nsr   Medicines ordered and prescription drug management:  I ordered medication including dilaudid  for pain  Reevaluation of the patient after these medicines showed that the patient improved I have reviewed the patients home medicines and have made  adjustments as needed   Critical Interventions:  Pain control   Consultations Obtained:  I requested consultation with the hospitalist (Dr. Roel Cluck),  and discussed lab and imaging findings as well as pertinent plan - she will admit  Problem List / ED Course:  Metastatic bony pain:  improved with dilaudid. I called [facility].  The nurse I spoke to did not know the patient and had no idea when they would expect pt's pain medications. UTI with hypotension:  catheter associated.  Possible sepsis.  Cultures and lactic added on.  Pt given Rocephin IV.   Reevaluation:  After the interventions noted above, I reevaluated the patient and found that they have :improved   Social Determinants of Health:  Lives in [place]; [place] speaker   Dispostion:  After consideration of the diagnostic results and the patients response to treatment, I feel that the patent would benefit from admission.          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

## 2022-10-30 NOTE — Assessment & Plan Note (Signed)
Patient currently followed at [facility] the plan was for patient to be discharged to SNF when pain medications available and then follow-up at [facility]

## 2022-10-30 NOTE — Assessment & Plan Note (Signed)
Chronic stable continue to monitor

## 2022-10-30 NOTE — Assessment & Plan Note (Signed)
Will have nutritional consult evaluate order prealbumin

## 2022-10-31 DIAGNOSIS — L8990 Pressure ulcer of unspecified site, unspecified stage: Secondary | ICD-10-CM | POA: Insufficient documentation

## 2022-10-31 DIAGNOSIS — E871 Hypo-osmolality and hyponatremia: Secondary | ICD-10-CM | POA: Diagnosis present

## 2022-10-31 DIAGNOSIS — N390 Urinary tract infection, site not specified: Secondary | ICD-10-CM | POA: Diagnosis present

## 2022-10-31 DIAGNOSIS — Z515 Encounter for palliative care: Secondary | ICD-10-CM | POA: Diagnosis not present

## 2022-10-31 DIAGNOSIS — Z7901 Long term (current) use of anticoagulants: Secondary | ICD-10-CM | POA: Diagnosis not present

## 2022-10-31 DIAGNOSIS — E876 Hypokalemia: Secondary | ICD-10-CM | POA: Diagnosis present

## 2022-10-31 DIAGNOSIS — Z87891 Personal history of nicotine dependence: Secondary | ICD-10-CM | POA: Diagnosis not present

## 2022-10-31 DIAGNOSIS — G9529 Other cord compression: Secondary | ICD-10-CM | POA: Diagnosis present

## 2022-10-31 DIAGNOSIS — T83511A Infection and inflammatory reaction due to indwelling urethral catheter, initial encounter: Secondary | ICD-10-CM | POA: Diagnosis present

## 2022-10-31 DIAGNOSIS — Z1152 Encounter for screening for COVID-19: Secondary | ICD-10-CM | POA: Diagnosis not present

## 2022-10-31 DIAGNOSIS — R52 Pain, unspecified: Secondary | ICD-10-CM

## 2022-10-31 DIAGNOSIS — I9589 Other hypotension: Secondary | ICD-10-CM | POA: Diagnosis not present

## 2022-10-31 DIAGNOSIS — Y846 Urinary catheterization as the cause of abnormal reaction of the patient, or of later complication, without mention of misadventure at the time of the procedure: Secondary | ICD-10-CM | POA: Diagnosis present

## 2022-10-31 DIAGNOSIS — R64 Cachexia: Secondary | ICD-10-CM | POA: Diagnosis present

## 2022-10-31 DIAGNOSIS — C7951 Secondary malignant neoplasm of bone: Secondary | ICD-10-CM | POA: Diagnosis present

## 2022-10-31 DIAGNOSIS — G8220 Paraplegia, unspecified: Secondary | ICD-10-CM | POA: Diagnosis present

## 2022-10-31 DIAGNOSIS — C249 Malignant neoplasm of biliary tract, unspecified: Secondary | ICD-10-CM | POA: Diagnosis not present

## 2022-10-31 DIAGNOSIS — C221 Intrahepatic bile duct carcinoma: Secondary | ICD-10-CM | POA: Diagnosis present

## 2022-10-31 DIAGNOSIS — D72829 Elevated white blood cell count, unspecified: Secondary | ICD-10-CM

## 2022-10-31 DIAGNOSIS — E860 Dehydration: Secondary | ICD-10-CM | POA: Diagnosis present

## 2022-10-31 DIAGNOSIS — Z66 Do not resuscitate: Secondary | ICD-10-CM | POA: Diagnosis not present

## 2022-10-31 DIAGNOSIS — Z6824 Body mass index (BMI) 24.0-24.9, adult: Secondary | ICD-10-CM | POA: Diagnosis not present

## 2022-10-31 DIAGNOSIS — E861 Hypovolemia: Secondary | ICD-10-CM | POA: Diagnosis present

## 2022-10-31 DIAGNOSIS — I959 Hypotension, unspecified: Secondary | ICD-10-CM | POA: Diagnosis present

## 2022-10-31 DIAGNOSIS — E440 Moderate protein-calorie malnutrition: Secondary | ICD-10-CM | POA: Diagnosis present

## 2022-10-31 DIAGNOSIS — Z79899 Other long term (current) drug therapy: Secondary | ICD-10-CM | POA: Diagnosis not present

## 2022-10-31 DIAGNOSIS — E785 Hyperlipidemia, unspecified: Secondary | ICD-10-CM | POA: Diagnosis present

## 2022-10-31 DIAGNOSIS — L89312 Pressure ulcer of right buttock, stage 2: Secondary | ICD-10-CM | POA: Diagnosis present

## 2022-10-31 DIAGNOSIS — D6959 Other secondary thrombocytopenia: Secondary | ICD-10-CM | POA: Diagnosis present

## 2022-10-31 LAB — COMPREHENSIVE METABOLIC PANEL
ALT: 117 U/L — ABNORMAL HIGH (ref 0–44)
AST: 33 U/L (ref 15–41)
Albumin: 2.2 g/dL — ABNORMAL LOW (ref 3.5–5.0)
Alkaline Phosphatase: 156 U/L — ABNORMAL HIGH (ref 38–126)
Anion gap: 4 — ABNORMAL LOW (ref 5–15)
BUN: 21 mg/dL (ref 8–23)
CO2: 27 mmol/L (ref 22–32)
Calcium: 7.9 mg/dL — ABNORMAL LOW (ref 8.9–10.3)
Chloride: 108 mmol/L (ref 98–111)
Creatinine, Ser: 0.47 mg/dL — ABNORMAL LOW (ref 0.61–1.24)
GFR, Estimated: >60 mL/min (ref >60)
Glucose, Bld: 104 mg/dL — ABNORMAL HIGH (ref 70–99)
Potassium: 3.9 mmol/L (ref 3.5–5.1)
Sodium: 139 mmol/L (ref 135–145)
Total Bilirubin: 0.8 mg/dL (ref 0.3–1.2)
Total Protein: 5.3 g/dL — ABNORMAL LOW (ref 6.5–8.1)

## 2022-10-31 LAB — OSMOLALITY, URINE: Osmolality, Ur: 617 mOsm/kg (ref 300–900)

## 2022-10-31 LAB — TSH: TSH: 0.558 u[IU]/mL (ref 0.350–4.500)

## 2022-10-31 LAB — CBC
HCT: 40.3 % (ref 39.0–52.0)
Hemoglobin: 13.6 g/dL (ref 13.0–17.0)
MCH: 32.9 pg (ref 26.0–34.0)
MCHC: 33.7 g/dL (ref 30.0–36.0)
MCV: 97.6 fL (ref 80.0–100.0)
Platelets: 65 10*3/uL — ABNORMAL LOW (ref 150–400)
RBC: 4.13 MIL/uL — ABNORMAL LOW (ref 4.22–5.81)
RDW: 16.6 % — ABNORMAL HIGH (ref 11.5–15.5)
WBC: 12.7 10*3/uL — ABNORMAL HIGH (ref 4.0–10.5)
nRBC: 0 % (ref 0.0–0.2)

## 2022-10-31 LAB — MAGNESIUM: Magnesium: 2.4 mg/dL (ref 1.7–2.4)

## 2022-10-31 LAB — PREALBUMIN: Prealbumin: 14 mg/dL — ABNORMAL LOW (ref 18–38)

## 2022-10-31 LAB — PHOSPHORUS: Phosphorus: 3.3 mg/dL (ref 2.5–4.6)

## 2022-10-31 LAB — OSMOLALITY: Osmolality: 299 mOsm/kg — ABNORMAL HIGH (ref 275–295)

## 2022-10-31 MED ORDER — LIDOCAINE VISCOUS HCL 2 % MT SOLN: 15.0000 mL | Freq: Four times a day (QID) | OROMUCOSAL | Status: DC | PRN

## 2022-10-31 MED ORDER — HYDROMORPHONE HCL 1 MG/ML IJ SOLN
1.0000 mg | INTRAMUSCULAR | Status: DC | PRN
  Administered 2022-10-31 – 2022-11-01 (×4): 1 mg via INTRAVENOUS
  Filled 2022-10-31 (×4): qty 1

## 2022-10-31 MED ORDER — HYDROCORTISONE SOD SUC (PF) 100 MG IJ SOLR
100.0000 mg | Freq: Three times a day (TID) | INTRAMUSCULAR | Status: DC
  Administered 2022-10-31 – 2022-11-03 (×9): 100 mg via INTRAVENOUS
  Filled 2022-10-31 (×9): qty 2

## 2022-10-31 MED ORDER — ACETAMINOPHEN 500 MG PO TABS
1000.0000 mg | ORAL_TABLET | Freq: Three times a day (TID) | ORAL | Status: DC
  Administered 2022-10-31 – 2022-11-09 (×27): 1000 mg via ORAL
  Filled 2022-10-31 (×26): qty 2

## 2022-10-31 MED ORDER — OXYCODONE HCL 5 MG PO TABS
10.0000 mg | ORAL_TABLET | ORAL | Status: DC | PRN
  Administered 2022-10-31 – 2022-11-06 (×10): 10 mg via ORAL
  Filled 2022-10-31 (×10): qty 2

## 2022-10-31 MED ORDER — SODIUM CHLORIDE 0.9 % IV SOLN: INTRAVENOUS | Status: AC

## 2022-10-31 MED ORDER — CHLORHEXIDINE GLUCONATE CLOTH 2 % EX PADS
6.0000 | MEDICATED_PAD | Freq: Every day | CUTANEOUS | Status: DC
  Administered 2022-10-31 – 2022-11-09 (×10): 6 via TOPICAL

## 2022-10-31 MED ORDER — ALBUMIN HUMAN 25 % IV SOLN
25.0000 g | Freq: Once | INTRAVENOUS | Status: AC
  Administered 2022-10-31: 25 g via INTRAVENOUS
  Filled 2022-10-31: qty 100

## 2022-10-31 MED ORDER — OXYCODONE HCL ER 15 MG PO T12A
15.0000 mg | EXTENDED_RELEASE_TABLET | Freq: Two times a day (BID) | ORAL | Status: DC
  Administered 2022-10-31 – 2022-11-01 (×2): 15 mg via ORAL
  Filled 2022-10-31 (×2): qty 1

## 2022-10-31 MED ORDER — HYDROMORPHONE HCL 2 MG PO TABS
1.0000 mg | ORAL_TABLET | ORAL | Status: DC | PRN
  Administered 2022-10-31: 1 mg via ORAL
  Filled 2022-10-31: qty 1

## 2022-10-31 MED ORDER — POLYETHYLENE GLYCOL 3350 17 G PO PACK
17.0000 g | PACK | Freq: Every day | ORAL | Status: DC
  Administered 2022-11-01 – 2022-11-03 (×3): 17 g via ORAL
  Filled 2022-10-31 (×3): qty 1

## 2022-10-31 MED ORDER — SODIUM CHLORIDE 0.9 % IV BOLUS
1000.0000 mL | Freq: Once | INTRAVENOUS | Status: AC
  Administered 2022-10-31: 1000 mL via INTRAVENOUS

## 2022-10-31 MED ORDER — ALUM & MAG HYDROXIDE-SIMETH 200-200-20 MG/5ML PO SUSP: 30.0000 mL | Freq: Four times a day (QID) | ORAL | Status: DC | PRN

## 2022-10-31 MED ORDER — SORBITOL 70 % SOLN
960.0000 mL | TOPICAL_OIL | Freq: Once | ORAL | Status: DC
  Filled 2022-10-31: qty 240

## 2022-10-31 MED ORDER — PANTOPRAZOLE SODIUM 40 MG PO TBEC
40.0000 mg | DELAYED_RELEASE_TABLET | Freq: Two times a day (BID) | ORAL | Status: DC
  Administered 2022-10-31 – 2022-11-09 (×18): 40 mg via ORAL
  Filled 2022-10-31 (×18): qty 1

## 2022-10-31 NOTE — Progress Notes (Signed)
PT Cancellation Note  Patient Details Name: Jason Lyons MRN: [id number] DOB: [birth year]   Cancelled Treatment:    Reason Eval/Treat Not Completed: Medical issues which prohibited therapy Patient remains hypotensive with increased pain. Nurse asking for therapy to hold at this time. PT to continue to follow and check back as schedule allows  Baxter Flattery, PT  Acute Rehab Dept [facility]) [phone]  [facility] Weekend Pager [facility] only)  [phone]  10/31/2022    [facility] 10/31/2022, 3:51 PM

## 2022-10-31 NOTE — Progress Notes (Addendum)
PROGRESS NOTE    Jason Lyons  ZJQ:[id number] DOB: 23-Sep-1956 DOA: 10/30/2022 PCP: Pcp, No    Chief Complaint  Patient presents with   Back Pain    Brief Narrative:  HPI per Dr. Frederica Kuster Blew is a 67 y.o. male with medical history significant of cholangiocarcinoma, metastatic to bone .  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 Alvy Bimler on chemotherapy with cisplatin and gemcitabine  But now he os followed at [facility] [place] pathological fracture T4 status post XRT  was seen in the [facility] ED and 09/30/2019 because of pain in the back as well as numbness of bilateral hands and feet.  He was given oxycodone and was discharged home and was noted to not be able to pass stool for the last 3 weeks but still had control of his urine.  We was readmitted 10/12/2022 at [facility] with right-sided chest pain that radiated to the back and was found to have a T6 spinal metastasis as well as new scattered pulmonary nodules.  Neurosurgery was consulted and did not feel the patient was an operative candidate and he was given Decadron and transferred over to [facility] for consideration of radiation therapy which would be palliative.  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 He was treated with radiation , decadron and pain management Pt was having urinary retention and had Foley Catheter inserted After completing his radiation therapy on 1/16 he was discahrged to SNF on 1/18 of January Radiation oncology has recommended tapering of the Decadron  He has known LFT elevations and RUQ [place] was done showing  "Heterogeneous nodular lesions in the liver measuring up to 3.6 cm and 2.9 cm, suspicious for metastatic disease,    At the time of discharge patient continued to have intermittent chest pain radiating to his back which was felt to be noncardiac His pain regimen included oxycodone IR 5 mg every 4 hours for moderate pain and Dilaudid 1 mg every 4 hours for severe pain while at [facility] at the discharge patient was prescribed Dilaudid 1 mg every 6 hours as needed for pain He was brought back today from [place] because they did not have pain medication   Assessment & Plan:   Principal Problem:   Hypotension Active Problems:   Pulmonary embolism (HCC)   Elevated LFTs   Other constipation   Cancer of biliary tract (HCC)   Paraplegia (HCC)   Malnutrition of moderate degree   Acute urinary retention   Leukocytosis   Pyuria   Hyponatremia   Thrombocytopenia ([place])   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 -Urinalysis done concerning for UTI, urine cultures pending. -Change Foley catheter. -Continue IV fluids, IV albumin x 1. -Patient received a dose of stress dose steroids as he did not receive his Decadron at  SNF and felt may have contributed to hypotension. -Continue empiric IV Rocephin. -Discontinue Decadron, and placed on Solu-Cortef 100 mg IV every 8 hours and follow. -Supportive care.  2.  Probable UTI -Urinalysis concerning for UTI. -Urine cultures pending. -Remove Foley catheter today which was done this morning, patient failed voiding trial and Foley catheter placed back in. -IV Rocephin.  3.  Pulmonary embolism -Continue  Eliquis.  4.  Severe constipation -Noted on KUB. -Patient was on chronic pain medications, patient with paraplegia, cord compression at T6 with encroachment of the thecal sac, was on pain medication. -Patient given milk of molasses enema on presentation, currently on bowel regimen with good bowel movement. -Continue MiraLAX daily, Senokot-S twice daily.  5.  Paraplegia and cord compression at T6 with encroachment of thecal sac -Noted during prior hospitalization 10/12/2022-10/28/2022. -During hospitalization patient seen by neurosurgery felt not a surgical candidate due to failure of systemic treatment and length of time with lower extremity weakness and recommended palliative radiation per oncology. -Patient completed palliative radiation during that hospitalization and started on Decadron and was on a Decadron taper on discharge however per admitting physician Decadron not available at SNF. -Patient felt not to CIR candidate. -During prior hospitalization Dr. Verlon Au had discussed with patient oncologist Dr. Patsey Berthold and she had nothing further to add and recommended follow-up with his oncologist at [facility] -Patient with ongoing uncontrolled pain at this time. -Due to soft blood pressure need to monitor pain regimen closely. -Placed on OxyContin 50 mg twice daily, oxycodone 10 mg every 4 hours breakthrough pain.  Dilaudid 1 mg IV every 4 hours as needed pain. -Placed on scheduled Tylenol 1000 mg 3 times daily. -Discontinue fentanyl. -Palliative care consultation for symptom management and goals of care.  6.  Metastatic adenocarcinoma biliary tract diagnosed 05/30/2021 -Will need outpatient follow-up with primary oncologist at [facility] on discharge.  7.  Acute urinary retention -Likely secondary to cord compression/#5. -Foley catheter removed this morning however patient failed voiding trial, bladder scan with approximately 700 cc. -Place Foley catheter back in, outpatient follow-up with  urology.  8.  Anterior chest/scapular pain -Noted during recent hospitalization, patient also noted with intermittent chest pain left-sided chest pain radiating around the side to the back. -Recent cardiac workup negative. -Place on PPI twice daily, GI cocktail as needed. -See #5.  9.  Moderate malnutrition in the context of chronic illness, POA -Placed on nutritional supplementation.  10.  Pyuria/?  UTI -Patient with urinary retention discharged with Foley catheter, Foley catheter removed this morning, patient with bladder scan with greater than 700 cc urine, place Foley catheter back in. -Continue IV Rocephin pending urine culture results.  11.  Hyponatremia -Likely secondary to hypovolemic hyponatremia, improving with hydration.  12.  Leukocytosis -In the setting of recent steroid use. -Urinalysis concerning for UTI, urine cultures pending. -Chest x-ray negative for any acute infiltrate.  Afebrile. -Leukocytosis trending down. -Continue IV Rocephin.  13.  Thrombocytopenia -In the setting of chronic illness/metastatic cancer. -Patient with no overt bleeding. -Continue empiric antibiotics for concern for UTI. -Follow-up.  14. Pressure injury Pressure Injury 10/31/22 Buttocks Right Stage 2 -  Partial thickness loss of dermis presenting as a shallow open injury with a red, pink wound bed without slough. (Active)  10/31/22 0519  Location: Buttocks  Location Orientation: Right  Staging: Stage 2 -  Partial thickness loss of dermis presenting as a shallow open injury with a red, pink wound bed without slough.  Wound Description (Comments):   Present  on Admission: Yes        DVT prophylaxis: Eliquis Code Status: Full Family Communication: Updated patient.  No family at bedside. Disposition: SNF once medically stable and able to confirm SNF for have all patient's medications  Status is: Inpatient The patient will require care spanning > 2 midnights and should be moved to  inpatient because: Inpatient due to severity of illness.   Consultants:  Palliative care pending  Procedures:  Abdominal x-ray 10/30/2022 Chest x-ray 10/30/2022  Antimicrobials:  IV Rocephin 10/30/2022>>>>   Subjective: Sleeping but arousable.  Complaining of back and chest pain.  Not sure whether he has peed after Foley catheter was removed.  No abdominal pain.  iPad interpreter used.  Objective: Vitals:   10/31/22 1205 10/31/22 1235 10/31/22 1400 10/31/22 1459  BP: (!) 93/59 (!) 90/59 (!) 91/57 94/63  Pulse:    (!) 57  Resp:      Temp:      TempSrc:      SpO2:        Intake/Output Summary (Last 24 hours) at 10/31/2022 1554 Last data filed at 10/31/2022 1550 Gross per 24 hour  Intake 1169.33 ml  Output 1750 ml  Net -580.67 ml   There were no vitals filed for this visit.  Examination:  General exam: Appears calm and comfortable  Respiratory system: Clear to auscultation anterior lung fields.  No wheezes, no crackles, no rhonchi.  Fair air movement.  Speaking in full sentences.Marland Kitchen Respiratory effort normal. Cardiovascular system: S1 & S2 heard, RRR. No JVD, murmurs, rubs, gallops or clicks. No pedal edema. Gastrointestinal system: Abdomen is nondistended, soft and nontender. No organomegaly or masses felt. Normal bowel sounds heard. Central nervous system: Alert and oriented.  Paraplegia.  Extremities: Symmetric 5 x 5 power. Skin: No rashes, lesions or ulcers Psychiatry: Judgement and insight appear normal. Mood & affect appropriate.     Data Reviewed: I have personally reviewed following labs and imaging studies  CBC: Recent Labs  Lab 10/25/22 0450 10/27/22 0433 10/28/22 0435 10/30/22 1900 10/31/22 0702  WBC 14.1* 17.0* 14.6* 18.4* 12.7*  NEUTROABS 12.3* 15.1* 13.3* 17.4*  --   HGB 13.2 13.3 13.1 14.6 13.6  HCT 39.4 39.7 38.8* 43.1 40.3  MCV 98.0 98.3 97.7 96.9 97.6  PLT 120* 120* 108* 82* 65*    Basic Metabolic Panel: Recent Labs  Lab 10/25/22 0450  10/27/22 0433 10/28/22 0435 10/30/22 1900 10/30/22 2212 10/31/22 0702  NA 137 136 133* 132*  --  139  K 4.2 4.5 4.4 3.7  --  3.9  CL 104 102 101 97*  --  108  CO2 '27 28 27 27  '$ --  27  GLUCOSE 148* 129* 149* 106*  --  104*  BUN 29* 35* 33* 28*  --  21  CREATININE 0.53* 0.63 0.55* 0.58*  --  0.47*  CALCIUM 8.8* 8.4* 8.2* 8.0*  --  7.9*  MG 2.2 2.2  --   --  1.9 2.4  PHOS 3.1 3.7  --   --  3.3 3.3    GFR: Estimated Creatinine Clearance: 76.9 mL/min (A) (by C-G formula based on SCr of 0.47 mg/dL (L)).  Liver Function Tests: Recent Labs  Lab 10/25/22 0450 10/27/22 0433 10/30/22 1900 10/31/22 0702  AST 56* 38 48* 33  ALT 137* 122* 148* 117*  ALKPHOS 167* 153* 184* 156*  BILITOT 0.4 0.6 1.0 0.8  PROT 5.3* 5.2* 5.9* 5.3*  ALBUMIN 2.3* 2.6* 2.7* 2.2*    CBG: Recent Labs  Lab 10/24/22 1653  GLUCAP 185*     Recent Results (from the past 240 hour(s))  Culture, blood (routine x 2)     Status: None (Preliminary result)   Collection Time: 10/30/22  9:02 PM   Specimen: BLOOD  Result Value Ref Range Status   Specimen Description   Final    BLOOD Performed at [facility] [address] [place], [place] [address]    Special Requests   Final    BOTTLES DRAWN AEROBIC AND ANAEROBIC Blood Culture adequate volume Performed at [facility] [address], [place], [place] [address]    Culture   Final    NO GROWTH < 12 HOURS Performed at [facility] [address] [place], [place] [address]    Report Status PENDING  Incomplete  Culture, blood (routine x 2)     Status: None (Preliminary result)   Collection Time: 10/31/22  7:02 AM   Specimen: BLOOD RIGHT HAND  Result Value Ref Range Status   Specimen Description   Final    BLOOD RIGHT HAND Performed at [facility], [place] [address] [place], [place] [address]    Special Requests   Final    AEROBIC BOTTLE ONLY Blood Culture adequate volume Performed at [facility] [address] [place], [place] [address]    Culture PENDING  Incomplete   Report Status PENDING  Incomplete         Radiology Studies: DG Abd 1 View  Result Date: 10/30/2022 CLINICAL DATA:  Metastatic cholangiocarcinoma, history pulmonary embolism on Eliquis, obstipation new paraplegia secondary to cord compression at T6 EXAM: ABDOMEN - 1 VIEW COMPARISON:  Portable exam 2124 hours without priors for comparison FINDINGS: Increased stool throughout colon to rectum. No bowel dilatation or obstruction. Small prostatic calcifications. Bones demineralized. IMPRESSION: Increased stool throughout colon to rectum. Electronically Signed   By: Lavonia Dana M.D.   On: 10/30/2022 21:34   DG Chest Portable 1 View  Result Date: 10/30/2022 CLINICAL DATA:  Hypotension EXAM: PORTABLE CHEST 1 VIEW COMPARISON:  Portable exam 2042 hours compared to 10/12/2022 FINDINGS: RIGHT jugular Port-A-Cath with tip projecting over SVC. Normal heart size, mediastinal contours, and pulmonary vascularity. Minimal bibasilar atelectasis. Lungs otherwise clear. No pulmonary infiltrate, pleural effusion, or pneumothorax. No acute osseous findings. IMPRESSION: Minimal bibasilar atelectasis. Electronically Signed   By: Lavonia Dana M.D.   On: 10/30/2022 20:50        Scheduled Meds:  acetaminophen  1,000 mg Oral TID   apixaban  5 mg Oral BID   dexamethasone  4 mg Oral Q12H   oxyCODONE  15 mg Oral Q12H   pantoprazole  40 mg Oral BID AC   [START ON 11/01/2022] polyethylene glycol  17 g Oral Daily   senna-docusate  1 tablet Oral BID   thiamine (VITAMIN B1) injection  100 mg Intravenous Daily   Continuous Infusions:  sodium chloride 125 mL/hr at 10/31/22 1455   cefTRIAXone (ROCEPHIN)  IV     methocarbamol (ROBAXIN) IV       LOS: 0 days    Time spent: 45 minutes    Irine Seal, MD [facility]   To contact the attending provider between 7A-7P or the covering provider during after  hours 7P-7A, please log into the web site [email or web address] and access using universal [place] password for that web site. If you do not have the password, please call the hospital operator.  10/31/2022, 3:54 PM

## 2022-10-31 NOTE — Progress Notes (Signed)
OT Cancellation Note  Patient Details Name: Adin Laker MRN: [id number] DOB: 08/01/1956   Cancelled Treatment:    Reason Eval/Treat Not Completed: Medical issues which prohibited therapy Patient remains hypotensive with increased pain. Nurse asking for therapy to hold at this time. OT to continue to follow and check back as schedule will allow.  Rennie Plowman, MS Acute Rehabilitation Department Office# [phone]  10/31/2022, 2:26 PM

## 2022-11-01 ENCOUNTER — Encounter: Payer: Self-pay | Admitting: Hematology and Oncology

## 2022-11-01 ENCOUNTER — Other Ambulatory Visit: Payer: Self-pay

## 2022-11-01 DIAGNOSIS — I9589 Other hypotension: Secondary | ICD-10-CM | POA: Diagnosis not present

## 2022-11-01 DIAGNOSIS — Z7189 Other specified counseling: Secondary | ICD-10-CM

## 2022-11-01 DIAGNOSIS — R52 Pain, unspecified: Secondary | ICD-10-CM | POA: Diagnosis not present

## 2022-11-01 DIAGNOSIS — G8220 Paraplegia, unspecified: Secondary | ICD-10-CM | POA: Diagnosis not present

## 2022-11-01 DIAGNOSIS — T83511A Infection and inflammatory reaction due to indwelling urethral catheter, initial encounter: Secondary | ICD-10-CM | POA: Diagnosis not present

## 2022-11-01 DIAGNOSIS — Z515 Encounter for palliative care: Secondary | ICD-10-CM

## 2022-11-01 DIAGNOSIS — C221 Intrahepatic bile duct carcinoma: Secondary | ICD-10-CM | POA: Diagnosis not present

## 2022-11-01 LAB — CBC WITH DIFFERENTIAL/PLATELET
Abs Immature Granulocytes: 0.05 10*3/uL (ref 0.00–0.07)
Basophils Absolute: 0 10*3/uL (ref 0.0–0.1)
Basophils Relative: 0 %
Eosinophils Absolute: 0 10*3/uL (ref 0.0–0.5)
Eosinophils Relative: 0 %
HCT: 33.6 % — ABNORMAL LOW (ref 39.0–52.0)
Hemoglobin: 11.2 g/dL — ABNORMAL LOW (ref 13.0–17.0)
Immature Granulocytes: 1 %
Lymphocytes Relative: 2 %
Lymphs Abs: 0.2 10*3/uL — ABNORMAL LOW (ref 0.7–4.0)
MCH: 32.7 pg (ref 26.0–34.0)
MCHC: 33.3 g/dL (ref 30.0–36.0)
MCV: 98.2 fL (ref 80.0–100.0)
Monocytes Absolute: 0.2 10*3/uL (ref 0.1–1.0)
Monocytes Relative: 3 %
Neutro Abs: 7.6 10*3/uL (ref 1.7–7.7)
Neutrophils Relative %: 94 %
Platelets: 51 10*3/uL — ABNORMAL LOW (ref 150–400)
RBC: 3.42 MIL/uL — ABNORMAL LOW (ref 4.22–5.81)
RDW: 16.7 % — ABNORMAL HIGH (ref 11.5–15.5)
WBC: 8.1 10*3/uL (ref 4.0–10.5)
nRBC: 0 % (ref 0.0–0.2)

## 2022-11-01 LAB — COMPREHENSIVE METABOLIC PANEL
ALT: 71 U/L — ABNORMAL HIGH (ref 0–44)
AST: 24 U/L (ref 15–41)
Albumin: 2.1 g/dL — ABNORMAL LOW (ref 3.5–5.0)
Alkaline Phosphatase: 101 U/L (ref 38–126)
Anion gap: 3 — ABNORMAL LOW (ref 5–15)
BUN: 20 mg/dL (ref 8–23)
CO2: 22 mmol/L (ref 22–32)
Calcium: 7.7 mg/dL — ABNORMAL LOW (ref 8.9–10.3)
Chloride: 111 mmol/L (ref 98–111)
Creatinine, Ser: 0.44 mg/dL — ABNORMAL LOW (ref 0.61–1.24)
GFR, Estimated: >60 mL/min (ref >60)
Glucose, Bld: 114 mg/dL — ABNORMAL HIGH (ref 70–99)
Potassium: 4 mmol/L (ref 3.5–5.1)
Sodium: 136 mmol/L (ref 135–145)
Total Bilirubin: 0.5 mg/dL (ref 0.3–1.2)
Total Protein: 4.4 g/dL — ABNORMAL LOW (ref 6.5–8.1)

## 2022-11-01 LAB — MRSA NEXT GEN BY PCR, NASAL: MRSA by PCR Next Gen: NOT DETECTED

## 2022-11-01 LAB — MAGNESIUM: Magnesium: 2.1 mg/dL (ref 1.7–2.4)

## 2022-11-01 MED ORDER — OXYCODONE HCL ER 20 MG PO T12A
20.0000 mg | EXTENDED_RELEASE_TABLET | Freq: Two times a day (BID) | ORAL | Status: DC
  Administered 2022-11-01 – 2022-11-04 (×6): 20 mg via ORAL
  Filled 2022-11-01 (×6): qty 1

## 2022-11-01 MED ORDER — HYDROMORPHONE HCL 1 MG/ML IJ SOLN
1.0000 mg | INTRAMUSCULAR | Status: DC | PRN
  Administered 2022-11-01 – 2022-11-07 (×27): 1 mg via INTRAVENOUS
  Filled 2022-11-01 (×27): qty 1

## 2022-11-01 MED ORDER — ENSURE ENLIVE PO LIQD
237.0000 mL | Freq: Three times a day (TID) | ORAL | Status: DC
  Administered 2022-11-01 – 2022-11-09 (×21): 237 mL via ORAL

## 2022-11-01 MED ORDER — THIAMINE MONONITRATE 100 MG PO TABS
100.0000 mg | ORAL_TABLET | Freq: Every day | ORAL | Status: DC
  Administered 2022-11-02 – 2022-11-05 (×4): 100 mg via ORAL
  Filled 2022-11-01 (×4): qty 1

## 2022-11-01 MED ORDER — GABAPENTIN 100 MG PO CAPS
100.0000 mg | ORAL_CAPSULE | Freq: Every day | ORAL | Status: DC
  Administered 2022-11-01 – 2022-11-02 (×2): 100 mg via ORAL
  Filled 2022-11-01 (×2): qty 1

## 2022-11-01 NOTE — Progress Notes (Signed)
PROGRESS NOTE    Jason Lyons  OIB:[id number] DOB: 08-01-1956 DOA: 10/30/2022 PCP: Pcp, No    Chief Complaint  Patient presents with   Back Pain    Brief Narrative:  HPI per Dr. Frederica Kuster Lyons is a 67 y.o. male with medical history significant of cholangiocarcinoma, metastatic to bone .  Followed at [facility], metastatic disease to T4 and T6 resulting in paraplegia with cord compression at T6 level and encroachment on the thecal sac, urinary retention status post Foley catheter malnutrition and thrombocytopenia history of PE on Eliquis     Presented with   not controlled back pain Pt with known adenocarcinoma of the biliary tract diagnosed in 05/13/2021 followed by Dr. Alvy Lyons on chemotherapy with cisplatin and gemcitabine  But now he os followed at [place] pathological fracture T4 status post XRT  was seen in the [facility] ED and 09/30/2019 because of pain in the back as well as numbness of bilateral hands and feet.  He was given oxycodone and was discharged home and was noted to not be able to pass stool for the last 3 weeks but still had control of his urine.  We was readmitted 10/12/2022 at [facility] with right-sided chest pain that radiated to the back and was found to have a T6 spinal metastasis as well as new scattered pulmonary nodules.  Neurosurgery was consulted and did not feel the patient was an operative candidate and he was given Decadron and transferred over to [facility] for consideration of radiation therapy which would be palliative.  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 He was treated with radiation , decadron and pain management Pt was having urinary retention and had Foley Catheter inserted After completing his radiation therapy on 1/16 he was discahrged to SNF on 1/18 of January Radiation oncology has recommended tapering of the Decadron  He has known LFT elevations and RUQ [place] was done showing  "Heterogeneous nodular lesions in the liver measuring up to 3.6 cm and 2.9 cm, suspicious for metastatic disease,    At the time of discharge patient continued to have intermittent chest pain radiating to his back which was felt to be noncardiac His pain regimen included oxycodone IR 5 mg every 4 hours for moderate pain and Dilaudid 1 mg every 4 hours for severe pain while at [facility] at the discharge patient was prescribed Dilaudid 1 mg every 6 hours as needed for pain He was brought back today from [place] because they did not have pain medication   Assessment & Plan:   Principal Problem:   Hypotension Active Problems:   Pulmonary embolism (HCC)   Elevated LFTs   Other constipation   Cancer of biliary tract (HCC)   Paraplegia (HCC)   Malnutrition of moderate degree   Acute urinary retention   Leukocytosis   Pyuria   Hyponatremia   Thrombocytopenia ([place])   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 -Urinalysis done concerning for UTI, urine cultures > 100,000 colonies of Jason Lyons. -Catheter exchanged. -Continue IV fluids. -Status post IV albumin. -Patient received a dose of stress dose steroids as he did not  receive his Decadron at SNF and felt may have contributed to hypotension. -Continue empiric IV Rocephin. -Discontinued Decadron, and patient placed on Solu-Cortef 100 mg IV every 8 hours with improvement with hypotension. -Supportive care.  2.  Jason Lyons UTI -Urinalysis concerning for UTI. -Urine cultures >100,000 colonies of Jason Lyons. -Foley catheter exchanged. -IV Rocephin pending sensitivities.  3.  Pulmonary  embolism -Eliquis.  4.  Severe constipation -Noted on KUB. -Patient was on chronic pain medications, patient with paraplegia, cord compression at T6 with encroachment of the thecal sac, was on pain medication. -Patient given milk of molasses enema on presentation, currently on bowel regimen with good bowel movement. -Continue MiraLAX daily, Senokot-S twice daily.  5.  Paraplegia and cord compression at T6 with encroachment of thecal sac -Noted during prior hospitalization 10/12/2022-10/28/2022. -During hospitalization patient seen by neurosurgery felt not a surgical candidate due to failure of systemic treatment and length of time with lower extremity weakness and recommended palliative radiation per oncology. -Patient completed palliative radiation during that hospitalization and started on Decadron and was on a Decadron taper on discharge however per admitting physician Decadron not available at SNF. -Patient felt not to CIR candidate. -During prior hospitalization Dr. Verlon Lyons had discussed with patient oncologist Dr. Patsey Lyons and she had nothing further to add and recommended follow-up with his oncologist at [facility]. -Patient with ongoing uncontrolled pain at this time. -Due to soft blood pressure need to monitor pain regimen closely. -Patient placed on stress dose IV steroids with improvement with soft blood pressure.  -Continue OxyContin 15 mg twice daily, oxycodone 10 mg every 4 hours breakthrough pain.  Change Dilaudid to 1 mg IV every 3 hours as needed severe pain.   -Continue scheduled Tylenol 1000 mg 3 times daily. -Discontinued fentanyl. -Palliative care consultation for symptom management and goals of care.  6.  Metastatic adenocarcinoma biliary tract diagnosed 05/30/2021 -Will need outpatient follow-up with primary oncologist at [facility] on discharge. -Palliative care consultation pending.  7.  Acute urinary retention -Likely secondary to cord compression/#5. -Foley catheter  removed however patient failed voiding trial and Foley catheter placed back in.   -Likely secondary to cord compression noted in problem #5.   -Outpatient follow-up with urology.    8.  Anterior chest/scapular pain -Noted during recent hospitalization, patient also noted with intermittent chest pain left-sided chest pain radiating around the side to the back. -Recent cardiac workup negative. -Continue PPI, GI cocktail as needed.  -See #5.  9.  Moderate malnutrition in the context of chronic illness, POA -Continue nutritional supplementation.  10.  Jason Lyons UTI  -Patient with urinary retention discharged with Foley catheter, Foley catheter removed-patient failed voiding trial and Foley catheter placed back in.   -IV Rocephin pending urine culture sensitivities.   11.  Hyponatremia -Likely secondary to hypovolemic hyponatremia, improved with hydration.  12.  Leukocytosis -In the setting of recent steroid use. -Urinalysis concerning for UTI, urine cultures > 100,000 colonies of Jason Lyons. -Chest x-ray negative for any acute infiltrate.  Afebrile. -Leukocytosis trending down. -Continue IV Rocephin pending sensitivities.  13.  Thrombocytopenia -In the setting of chronic illness/metastatic cancer. -Patient with no overt bleeding. -Continue empiric antibiotics for concern for UTI. -Follow-up.  14. Pressure injury Pressure Injury 10/31/22 Buttocks Right Stage 2 -  Partial thickness loss of dermis presenting as a shallow open injury with a red, pink wound bed without slough. (Active)  10/31/22 0519  Location: Buttocks  Location Orientation: Right  Staging: Stage 2 -  Partial  thickness loss of dermis presenting as a shallow open injury with a red, pink wound bed without slough.  Wound Description (Comments):   Present on Admission: Yes        DVT prophylaxis: Eliquis Code Status: Full Family Communication: Updated patient.  No family at bedside. Disposition: SNF once medically  stable and able to confirm SNF has all patient's medications.  Will likely need outpatient follow-up with palliative care.  Status is: Inpatient The patient will require care spanning > 2 midnights and should be moved to inpatient because: Inpatient due to severity of illness.   Consultants:  Palliative care pending  Procedures:  Abdominal x-ray 10/30/2022 Chest x-ray 10/30/2022  Antimicrobials:  IV Rocephin 10/30/2022>>>>   Subjective: Patient laying in bed.  Still with complaints of chest and back pain.  Stated IV Dilaudid helped decrease some of the pain, noted to have received pain medication 3 hours ago, still complaining of pain.  Denies any shortness of breath.  No abdominal pain.  Blood pressure improved on stress dose steroids and IV fluids.   Objective: Vitals:   10/31/22 1858 10/31/22 2131 11/01/22 0212 11/01/22 0512  BP: 90/60 102/64 105/67 101/68  Pulse:  (!) 59  (!) 51  Resp:  '17 12 16  '$ Temp:  98.1 F (36.7 C)  97.9 F (36.6 C)  TempSrc:  Oral  Oral  SpO2:  98%  98%    Intake/Output Summary (Last 24 hours) at 11/01/2022 1203 Last data filed at 11/01/2022 0700 Gross per 24 hour  Intake 1656.46 ml  Output 1875 ml  Net -218.54 ml    There were no vitals filed for this visit.  Examination:  General exam: NAD. Respiratory system: Lungs clear to auscultation bilaterally.  No wheezes, no crackles, no rhonchi.  Fair air movement.  Speaking in full sentences.  Normal respiratory effort.   Cardiovascular system: RRR no murmurs rubs or gallops.  No JVD.  No lower extremity edema.  Gastrointestinal system: Abdomen is soft, nontender, nondistended, positive bowel sounds.  No rebound.  No guarding.  Central nervous system: Alert and oriented.  Paraplegia.  Extremities: Symmetric 5 x 5 power. Skin: No rashes, lesions or ulcers Psychiatry: Judgement and insight appear normal. Mood & affect appropriate.     Data Reviewed: I have personally reviewed following labs and  imaging studies  CBC: Recent Labs  Lab 10/27/22 0433 10/28/22 0435 10/30/22 1900 10/31/22 0702 11/01/22 0549  WBC 17.0* 14.6* 18.4* 12.7* 8.1  NEUTROABS 15.1* 13.3* 17.4*  --  7.6  HGB 13.3 13.1 14.6 13.6 11.2*  HCT 39.7 38.8* 43.1 40.3 33.6*  MCV 98.3 97.7 96.9 97.6 98.2  PLT 120* 108* 82* 65* 51*     Basic Metabolic Panel: Recent Labs  Lab 10/27/22 0433 10/28/22 0435 10/30/22 1900 10/30/22 2212 10/31/22 0702 11/01/22 0549  NA 136 133* 132*  --  139 136  K 4.5 4.4 3.7  --  3.9 4.0  CL 102 101 97*  --  108 111  CO2 '28 27 27  '$ --  27 22  GLUCOSE 129* 149* 106*  --  104* 114*  BUN 35* 33* 28*  --  21 20  CREATININE 0.63 0.55* 0.58*  --  0.47* 0.44*  CALCIUM 8.4* 8.2* 8.0*  --  7.9* 7.7*  MG 2.2  --   --  1.9 2.4 2.1  PHOS 3.7  --   --  3.3 3.3  --      GFR: Estimated Creatinine Clearance: 76.9 mL/min (A) (  by C-G formula based on SCr of 0.44 mg/dL (L)).  Liver Function Tests: Recent Labs  Lab 10/27/22 0433 10/30/22 1900 10/31/22 0702 11/01/22 0549  AST 38 48* 33 24  ALT 122* 148* 117* 71*  ALKPHOS 153* 184* 156* 101  BILITOT 0.6 1.0 0.8 0.5  PROT 5.2* 5.9* 5.3* 4.4*  ALBUMIN 2.6* 2.7* 2.2* 2.1*     CBG: No results for input(s): "GLUCAP" in the last 168 hours.    Recent Results (from the past 240 hour(s))  Urine Culture     Status: Abnormal (Preliminary result)   Collection Time: 10/30/22  8:30 PM   Specimen: Urine, Catheterized  Result Value Ref Range Status   Specimen Description   Final    URINE, CATHETERIZED Performed at [facility] [facility] [address] [place], [place] [address]    Special Requests   Final    NONE Performed at [facility], [place] [address] [place], [place] [address]    Culture (A)  Final    >=100,000 COLONIES/mL ESCHERICHIA Lyons SUSCEPTIBILITIES TO FOLLOW Performed at [facility], [place] [address], [place], [place] [address]    Report Status PENDING  Incomplete  Culture, blood  (routine x 2)     Status: None (Preliminary result)   Collection Time: 10/30/22  9:02 PM   Specimen: BLOOD  Result Value Ref Range Status   Specimen Description   Final    BLOOD Performed at [place] [address] [place], [place] [address]    Special Requests   Final    BOTTLES DRAWN AEROBIC AND ANAEROBIC Blood Culture adequate volume Performed at [place] [address], [place], [place] [address]    Culture   Final    NO GROWTH 1 DAY Performed at [facility], [place] [address], [place], [place] [address]    Report Status PENDING  Incomplete  Culture, blood (routine x 2)     Status: None (Preliminary result)   Collection Time: 10/31/22  7:02 AM   Specimen: BLOOD RIGHT HAND  Result Value Ref Range Status   Specimen Description   Final    BLOOD RIGHT HAND Performed at [facility], [place] [address] [place], [place] [address]    Special Requests   Final    AEROBIC BOTTLE ONLY Blood Culture adequate volume Performed at [place] [address] [place], [place] [address]    Culture   Final    NO GROWTH < 24 HOURS Performed at [facility] [address] [place], [place] [address]    Report Status PENDING  Incomplete         Radiology Studies: DG Abd 1 View  Result Date: 10/30/2022 CLINICAL DATA:  Metastatic cholangiocarcinoma, history pulmonary embolism on Eliquis, obstipation new paraplegia secondary to cord compression at T6 EXAM: ABDOMEN - 1 VIEW COMPARISON:  Portable exam 2124 hours without priors for comparison FINDINGS: Increased stool throughout colon to rectum. No bowel dilatation or obstruction. Small prostatic calcifications. Bones demineralized. IMPRESSION: Increased stool throughout colon to rectum. Electronically Signed   By: Lavonia Dana M.D.   On: 10/30/2022 21:34   DG Chest Portable 1 View  Result Date: 10/30/2022 CLINICAL DATA:  Hypotension EXAM: PORTABLE CHEST 1 VIEW  COMPARISON:  Portable exam 2042 hours compared to 10/12/2022 FINDINGS: RIGHT jugular Port-A-Cath with tip projecting over SVC. Normal heart size, mediastinal contours, and pulmonary vascularity. Minimal bibasilar atelectasis. Lungs otherwise clear.  No pulmonary infiltrate, pleural effusion, or pneumothorax. No acute osseous findings. IMPRESSION: Minimal bibasilar atelectasis. Electronically Signed   By: Lavonia Dana M.D.   On: 10/30/2022 20:50        Scheduled Meds:  acetaminophen  1,000 mg Oral TID   apixaban  5 mg Oral BID   Chlorhexidine Gluconate Cloth  6 each Topical Daily   feeding supplement  237 mL Oral TID BM   hydrocortisone sod succinate (SOLU-CORTEF) inj  100 mg Intravenous Q8H   oxyCODONE  15 mg Oral Q12H   pantoprazole  40 mg Oral BID AC   polyethylene glycol  17 g Oral Daily   senna-docusate  1 tablet Oral BID   [START ON 11/02/2022] thiamine  100 mg Oral Daily   Continuous Infusions:  sodium chloride 125 mL/hr at 11/01/22 0927   cefTRIAXone (ROCEPHIN)  IV 2 g (10/31/22 2009)   methocarbamol (ROBAXIN) IV       LOS: 1 day    Time spent: 40 minutes    Irine Seal, MD [facility]   To contact the attending provider between 7A-7P or the covering provider during after hours 7P-7A, please log into the web site [email or web address] and access using universal [place] password for that web site. If you do not have the password, please call the hospital operator.  11/01/2022, 12:03 PM

## 2022-11-01 NOTE — Evaluation (Signed)
Physical Therapy Evaluation Patient Details Name: Jason Lyons MRN: [id number] DOB: Dec 29, 1955 Today's Date: 11/01/2022  History of Present Illness  Patient is a 67 year old male who presented back to hospital after recent transition to SNF with uncontrolled pain. CNO:[id number] fracture T4,metastatic disease to T4 and T6 resulting in paraplegia with cord compression at T6 level and encroachment on the thecal sac, urinary retention status post Foley catheter malnutrition.  Clinical Impression  Pt admitted with above diagnosis.  Pt currently with functional limitations due to the deficits listed below (see PT Problem List). Pt will benefit from skilled PT to increase their independence and safety with mobility to allow discharge to the venue listed below.  Pt assisted with sitting EOB and core strengthening activities.  Pt had just discharged to SNF and was then readmitted.  Recommend return to SNF upon d/c.        Recommendations for follow up therapy are one component of a multi-disciplinary discharge planning process, led by the attending physician.  Recommendations may be updated based on patient status, additional functional criteria and insurance authorization.  Follow Up Recommendations Skilled nursing-short term rehab (<3 hours/day) Can patient physically be transported by private vehicle: No    Assistance Recommended at Discharge Frequent or constant Supervision/Assistance  Patient can return home with the following  A lot of help with walking and/or transfers;A lot of help with bathing/dressing/bathroom    Equipment Recommendations Hospital bed (hoyer lift)  Recommendations for Other Services       Functional Status Assessment Patient has had a recent decline in their functional status and demonstrates the ability to make significant improvements in function in a reasonable and predictable amount of time.     Precautions / Restrictions Precautions Precautions:  Fall;Back Precaution Comments: Thoracic lesions, paraplegia Restrictions Weight Bearing Restrictions: No      Mobility  Bed Mobility Overal bed mobility: Needs Assistance Bed Mobility: Rolling, Sidelying to Sit, Sit to Sidelying Rolling: Mod assist, +2 for physical assistance Sidelying to sit: Mod assist, +2 for physical assistance     Sit to sidelying: Max assist, +2 for physical assistance General bed mobility comments: patient needed physical A and cues for maintaining back precautions for supine to sit on edge of bed. increased A for trunk to midline and movement/positioning of BLE    Transfers                        Ambulation/Gait                  Stairs            Wheelchair Mobility    Modified Rankin (Stroke Patients Only)       Balance Overall balance assessment: Needs assistance Sitting-balance support: Bilateral upper extremity supported, Feet supported Sitting balance-Leahy Scale: Poor Sitting balance - Comments: patient is reliant on UE support during session. patient was able to progress to bilateral hands on lap and min guard +2                                     Pertinent Vitals/Pain Pain Assessment Pain Assessment: 0-10 Pain Score: 10-Worst pain ever Pain Location: back Pain Descriptors / Indicators: Grimacing, Sharp Pain Intervention(s): Repositioned, Monitored during session, Limited activity within patient's tolerance    Home Living Family/patient expects to be discharged to:: Skilled nursing facility Living Arrangements: Spouse/significant other;Children Available Help  at Discharge: Family Type of Home: House Home Access: Level entry       Home Layout: One level [place]: Wheelchair - manual      Prior Function Prior Level of Function : Needs assist               ADLs Comments: patient has support for ADls from son prior level per chart review. patient was at SNF prior to this  admission for rehab. plan to d/c back to SNF at time of d/c.     Hand Dominance        Extremity/Trunk Assessment   Upper Extremity Assessment Upper Extremity Assessment: Overall WFL for tasks assessed    Lower Extremity Assessment Lower Extremity Assessment: Defer to PT evaluation (flaccid BLEs) RLE Deficits / Details: flaccid bil LEs, reports sensation intact    Cervical / Trunk Assessment Cervical / Trunk Assessment: Normal  Communication   Communication: Prefers language other than English  Cognition Arousal/Alertness: Awake/alert Behavior During Therapy: Flat affect Overall Cognitive Status: Difficult to assess                                 General Comments: pt able to communicate with minimal basic English        General Comments      Exercises     Assessment/Plan    PT Assessment Patient needs continued PT services  PT Problem List Decreased strength;Decreased activity tolerance;Decreased balance;Decreased knowledge of use of DME;Decreased mobility;Pain;Decreased coordination       PT Treatment Interventions Balance training;Functional mobility training;Therapeutic activities;Patient/family education;Wheelchair mobility training;Therapeutic exercise;DME instruction;Neuromuscular re-education    PT Goals (Current goals can be found in the Care Plan section)  Acute Rehab PT Goals PT Goal Formulation: With patient Time For Goal Achievement: 11/15/22 Potential to Achieve Goals: Fair    Frequency Min 2X/week     Co-evaluation PT/OT/SLP Co-Evaluation/Treatment: Yes Reason for Co-Treatment: For patient/therapist safety PT goals addressed during session: Mobility/safety with mobility OT goals addressed during session: ADL's and self-care       AM-PAC PT "6 Clicks" Mobility  Outcome Measure Help needed turning from your back to your side while in a flat bed without using bedrails?: A Lot Help needed moving from lying on your back to  sitting on the side of a flat bed without using bedrails?: Total Help needed moving to and from a bed to a chair (including a wheelchair)?: Total Help needed standing up from a chair using your arms (e.g., wheelchair or bedside chair)?: Total Help needed to walk in hospital room?: Total Help needed climbing 3-5 steps with a railing? : Total 6 Click Score: 7    End of Session   Activity Tolerance: Patient limited by pain Patient left: in bed;with call bell/phone within reach Nurse Communication: Mobility status PT Visit Diagnosis: Other abnormalities of gait and mobility (R26.89);Other symptoms and signs involving the nervous system (R29.898)    Time: 8119-1478 PT Time Calculation (min) (ACUTE ONLY): 15 min   Charges:   PT Evaluation $PT Eval Low Complexity: 1 Low         Kati PT, DPT Physical Therapist Acute Rehabilitation Services Preferred contact method: Secure Chat Weekend Pager Only: [phone] Office: [place] 11/01/2022, 2:12 PM

## 2022-11-01 NOTE — TOC Initial Note (Signed)
Transition of Care [facility]) - Initial/Assessment Note    Patient Details  Name: Jason Lyons MRN: [id number] Date of Birth: 1956/07/11  Transition of Care [facility]) CM/SW Contact:    Leeroy Cha, RN Phone Number: 11/01/2022, 8:45 AM  Clinical Narrative:                  Transition of Care [facility]) Screening Note   Patient Details  Name: Jason Lyons Date of Birth: Feb 07, 1956   Transition of Care [facility]) CM/SW Contact:    Leeroy Cha, RN Phone Number: 11/01/2022, 8:45 AM    Transition of Care Department [facility]) has reviewed patient and no TOC needs have been identified at this time. We will continue to monitor patient advancement through interdisciplinary progression rounds. If new patient transition needs arise, please place a TOC consult.    Expected Discharge Plan: Home/Self Care Barriers to Discharge: Continued Medical Work up   Patient Goals and CMS Choice Patient states their goals for this hospitalization and ongoing recovery are:: home CMS Medicare.gov Compare Post Acute Care list provided to:: Patient Choice offered to / list presented to : Patient      Expected Discharge Plan and Services   Discharge Planning Services: CM Consult   Living arrangements for the past 2 months: Single Family Home                                      Prior Living Arrangements/Services Living arrangements for the past 2 months: Single Family Home Lives with:: Spouse Patient language and need for interpreter reviewed:: Yes Do you feel safe going back to the place where you live?: Yes      Need for Family Participation in Patient Care: Yes (Comment) (wife) Care giver support system in place?: Yes (comment) (wife)   Criminal Activity/Legal Involvement Pertinent to Current Situation/Hospitalization: No - Comment as needed  Activities of Daily Living      Permission Sought/Granted                  Emotional Assessment Appearance:: Appears stated  age Attitude/Demeanor/Rapport: Gracious Affect (typically observed): Accepting Orientation: : Oriented to Self, Oriented to Place, Oriented to  Time, Oriented to Situation Alcohol / Substance Use: Tobacco Use, Alcohol Use (quit smoking approx. 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 ([place]) 10/31/2022   Urinary tract infection associated with indwelling urethral catheter ([place]) 10/31/2022   Intractable pain 10/31/2022   Pressure injury of skin 10/31/2022   Hypotension 10/30/2022   Pyuria 10/30/2022   Hyponatremia 10/30/2022   Thrombocytopenia ([place]) 10/30/2022   Spinal cord compression ([place]) 10/27/2022   AKI (acute kidney injury) ([place]) 10/27/2022   Acute urinary retention 10/27/2022   Leukocytosis 10/27/2022   Metastasis to spinal column ([place]) 10/27/2022   Malnutrition of moderate degree 10/16/2022   Lower extremity weakness 10/13/2022   Paraplegia ([place]) 10/13/2022   Pancytopenia, acquired ([place]) 07/31/2021   Financial difficulties 06/02/2021   Goals of care, counseling/discussion 05/21/2021   Cancer of biliary tract ([place]) 05/20/2021   Metastatic cancer to spine [facility])    Other constipation    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

## 2022-11-01 NOTE — Progress Notes (Signed)
                                                                                                                                                             Patient Name: Jason Lyons MRN: [id number] DOB: 1956-05-03 Referring Physician: Alvy Bimler NI (Profile Not Attached) Date of Service: 10/27/2022 [facility] [place]                                                        End Of Treatment Note  Diagnoses: C24.9-Malignant neoplasm of biliary tract, unspecified C79.51-Secondary malignant neoplasm of bone  Cancer Staging:  Metastatic Cholangiocarcinoma with multilevel disease in the thoracic spine resulting in spinal cord compression   Intent: Palliative  Radiation Treatment Dates: 10/14/2022 through 10/27/2022 Site Technique Total Dose (Gy) Dose per Fx (Gy) Completed Fx Beam Energies  Thoracic Spine: Spine_T3 3D 18/18 3 6/6 15X  Thoracic Spine: Spine_T6 3D 30/30 3 10/10 15X  Thoracic Spine: Spine_Bst_T3 3D 12/12 3 4/4 15X   Narrative: The patient tolerated radiation therapy relatively well.    Plan: The patient will receive a call in about one month from the radiation oncology department. He was given taper instructions for his steroids. He will continue follow up with Dr. Nelda Marseille at [facility] in medical oncology as well.   ________________________________________________    Carola Rhine, PAC

## 2022-11-01 NOTE — Progress Notes (Signed)
Initial Nutrition Assessment  DOCUMENTATION CODES:   Non-severe (moderate) malnutrition in context of chronic illness  INTERVENTION:   -Ensure Plus High Protein po TID, each supplement provides 350 kcal and 20 grams of protein.   -Liberalize diet to regular to maximize menu options and promote good PO.  NUTRITION DIAGNOSIS:   Moderate Malnutrition related to chronic illness, cancer and cancer related treatments as evidenced by moderate fat depletion, moderate muscle depletion.  GOAL:   Patient will meet greater than or equal to 90% of their needs  MONITOR:   PO intake, Supplement acceptance, Weight trends, Labs, I & O's, Skin  REASON FOR ASSESSMENT:   Consult Assessment of nutrition requirement/status  ASSESSMENT:   67 y.o. male with medical history significant of cholangiocarcinoma, metastatic to bone .  Followed at [facility], metastatic disease to T4 and T6 resulting in paraplegia with cord compression at T6 level and encroachment on the thecal sac, urinary retention status post Foley catheter malnutrition and thrombocytopenia history of PE on Eliquis        Presented with   not controlled back pain  Pt with known adenocarcinoma of the biliary tract diagnosed in 05/13/2021 followed by Dr. Alvy Bimler on chemotherapy with cisplatin and gemcitabine.  Patient readmitted (recently discharged 1/18) d/t back pain. Pt continues to have good appetite, consuming 100% of meals. Will order Ensure supplements for additional kcals and protein, pt likes chocolate flavor.   Per weight records, pt has lost 10 lbs between 1/9 and 1/16. No other weights available for this admission.  Medications: Miralax, Senokot, Thiamine  Labs reviewed.  NUTRITION - FOCUSED PHYSICAL EXAM:  Flowsheet Row Most Recent Value  Orbital Region Moderate depletion  Upper Arm Region Moderate depletion  Thoracic and Lumbar Region Moderate depletion  Buccal Region Moderate depletion  Temple Region Moderate depletion   Clavicle Bone Region Moderate depletion  Clavicle and Acromion Bone Region Moderate depletion  Scapular Bone Region Moderate depletion  Dorsal Hand Mild depletion  Patellar Region Moderate depletion  Anterior Thigh Region Moderate depletion  Posterior Calf Region Moderate depletion  Edema (RD Assessment) None  Hair Reviewed  Eyes Reviewed  Mouth Reviewed  Skin Reviewed  Nails Reviewed       Diet Order:   Diet Order             Diet regular Room service appropriate? Yes; Fluid consistency: Thin  Diet effective now                   EDUCATION NEEDS:   No education needs have been identified at this time  Skin:  Skin Assessment: Skin Integrity Issues: Skin Integrity Issues:: Stage II Stage II: right buttocks  Last BM:  1/21 -type 6  Height:   Ht Readings from Last 1 Encounters:  11/01/22 '5\' 5"'$  (1.651 m)    Weight:   Wt Readings from Last 1 Encounters:  10/26/22 66.1 kg    BMI:  Body mass index is 24.25 kg/m.  Estimated Nutritional Needs:   Kcal:  1900-2100  Protein:  95-105g  Fluid:  2L/day  Clayton Bibles, MS, RD, LDN Inpatient Clinical Dietitian Contact information available via Amion

## 2022-11-01 NOTE — Consult Note (Signed)
Palliative Care Consult Note                                  Date: 11/01/2022   Patient Name: Jason Lyons  DOB: 09-20-1956  MRN: [id number]  Age / Sex: 67 y.o., male  PCP: Pcp, No Referring Physician: Eugenie Filler, MD  Reason for Consultation: Establishing goals of care  HPI/Patient Profile: 67 y.o. male  with past medical history of cholangiocarcinoma metastatic to bone. He follows with oncology at [facility] and has been receiving chemotherapy. He was recently admitted to [facility] on 10/12/22 with chest and back pain and found to have a T6 spinal metastasis as well as new scattered pulmonary nodules. There was concern for cord compression. He was seen by neurosurgery and deemed not a surgical candidate. He was treated with palliative radiation, decadron, and pain medication. He was discharged to SNF on 10/29/22 but they did not have pain medication and he presented back to the ED 10/30/2022 with uncontrolled back pain.   Palliative Medicine was consulted for goals of care and pain management.   History reviewed. No pertinent past medical history.  Subjective:   I have reviewed medical records including progress notes, labs and imaging.   I met with patient, son Otho Ket (in-person), and another son Nam (by phone) to discuss diagnosis, prognosis, GOC, disposition, and options. Telephone interpreter services were utilized [phone]  I introduced Palliative Medicine as specialized medical care for people living with serious illness. It focuses on providing relief from the symptoms and stress of a serious illness.   Mr. Riva was born in [place] and immigrated to the [place] around 2006. He is married and has 3 sons. Prior to his admission on 10/12/22, he was living at home with his wife and his son 41. Due to the cord compression, Mr. Nez can not walk and his family is unable to care for him home.   We discussed patient's current  illness and what it means in the larger context of his ongoing co-morbidities. Current clinical status was reviewed. Natural disease trajectory of metastatic cancer was discussed, emphasizing that it is a non-curable condition and the intent of treatment is palliative.  Values and goals of care were attempted to be elicited. Patient states that his goal is to continue treatment. I reviewed with patient and family that during his previous hospitalization, the attending physician on 10/18/22 had discussed his case with oncology (Dr. Alvy Bimler). At that time, she had nothing to add and recommended he follow up with his oncologist at [facility].  I shared my concern that patient would not be a candidate for further treatment due to his functional decline and non-ambulatory status.  The difference between aggressive medical intervention and comfort care was considered in light of the patient's goals of care.  We did discuss code status. Encouraged consideration DNR/DNI status understanding prognosis would be poor in the event of cardiac arrest, as the cause would likely be associated with terminal disease rather than a reversible acute event.  I explained that DNR/DNI  is a protective measure to keep [place] from harming the patient in their last moments of life. Patient/family request time to discuss further amongst themselves.    Review of Systems  Musculoskeletal:  Positive for back pain.    Objective:   Primary Diagnoses: Present on Admission:  Hypotension  Pulmonary embolism ([place])  Cancer of biliary tract ([place])  Elevated LFTs  Other constipation  Paraplegia (HCC)  Malnutrition of moderate degree  Acute urinary retention  Leukocytosis  Pyuria  Hyponatremia  Thrombocytopenia (HCC)   Physical Exam Vitals reviewed.  Constitutional:      General: He is not in acute distress.    Appearance: He is ill-appearing.  Pulmonary:     Effort: Pulmonary effort is normal.  Neurological:     Mental  Status: He is alert and oriented to person, place, and time.     Motor: Weakness present.     Vital Signs:  BP 112/64 (BP Location: Left Arm)   Pulse (!) 47   Temp 97.9 F (36.6 C) (Oral)   Resp 18   Ht '5\' 5"'$  (1.651 m)   SpO2 99%   BMI 24.25 kg/m   Palliative Assessment/Data: PPS 30%     Assessment & Plan:   SUMMARY OF RECOMMENDATIONS   Full code for now - encouraged consideration of DNR status Continue current supportive interventions Patient's goal is to continue treatment - I expressed concern he will not be a candidate due to poor functional status PMT will follow up tomorrow  Primary Decision Maker: PATIENT  Symptom Management:  Increase oxycontin to 20 mg BID Neurontin 100 mg at bedtime Continue oxycodone IR 10 mg every 4 hours as needed for breakthrough pain Continue dilaudid 1 mg IV every 3 hours as needed for severe pain Continue bowel regiman  Prognosis:  < 6 months  Discharge Planning:  To Be Determined   Discussed with: Dr. Grandville Silos   Thank you for allowing [place] to participate in the care of Wendall Isabell   Time Total: 90 minutes  Greater than 50%  of this time was spent counseling and coordinating care related to the above assessment and plan.  Signed by: Elie Confer, NP Palliative Medicine Team  Team Phone # [phone]  For individual providers, please see AMION

## 2022-11-01 NOTE — Evaluation (Signed)
Occupational Therapy Evaluation Patient Details Name: Jason Lyons MRN: [id number] DOB: Oct 25, 1955 Today's Date: 11/01/2022   History of Present Illness Patient is a 67 year old male who presented back to hospital after recent transition to SNF with uncontrolled pain. EXB:[id number] fracture T4,metastatic disease to T4 and T6 resulting in paraplegia with cord compression at T6 level and encroachment on the thecal sac, urinary retention status post Foley catheter malnutrition.   Clinical Impression   Patient is a 67 year old male who was admitted for above. Patient plans to d/c back to SNF to continue short tern rehab at time of d/c from therapy. Patient was +2 for sitting EOB with moments of min guard +2 for safety. Patient was noted to have decreased ability to engage in transfers, decreased functional activity tolerance, increased pain, decreased sitting balance effecting participation in ADLs. Patient would continue to benefit from skilled OT services at this time while admitted and after d/c to address noted deficits in order to improve overall safety and independence in ADLs.        Recommendations for follow up therapy are one component of a multi-disciplinary discharge planning process, led by the attending physician.  Recommendations may be updated based on patient status, additional functional criteria and insurance authorization.   Follow Up Recommendations  Skilled nursing-short term rehab (<3 hours/day)     Assistance Recommended at Discharge Frequent or constant Supervision/Assistance  Patient can return home with the following Two people to help with walking and/or transfers;Assistance with cooking/housework;Two people to help with bathing/dressing/bathroom;Direct supervision/assist for financial management;Help with stairs or ramp for entrance;Assist for transportation;Direct supervision/assist for medications management    Functional Status Assessment  Patient has had a  recent decline in their functional status and demonstrates the ability to make significant improvements in function in a reasonable and predictable amount of time.  Equipment Recommendations  Other (comment) (defer to next venue)    Recommendations for Other Services       Precautions / Restrictions Precautions Precautions: Fall;Back Precaution Comments: Thoracic lesions, paraplegia Restrictions Weight Bearing Restrictions: No      Mobility Bed Mobility Overal bed mobility: Needs Assistance   Rolling: Mod assist, +2 for physical assistance Sidelying to sit: Mod assist, +2 for physical assistance     Sit to sidelying: Max assist, +2 for physical assistance General bed mobility comments: patient needed physical A and cues for maintaining back precautions for supine to sit on edge of bed. increased A for trunk to midline and movement of BLE        Balance Overall balance assessment: Needs assistance Sitting-balance support: Bilateral upper extremity supported, Feet supported Sitting balance-Leahy Scale: Poor Sitting balance - Comments: patient is reliant on UE support during session. patient was able to progress to bilateral hands on lap and min guard +2             ADL either performed or assessed with clinical judgement   ADL Overall ADL's : Needs assistance/impaired Eating/Feeding: Set up;Bed level   Grooming: Set up;Bed level   Upper Body Bathing: Set up;Bed level   Lower Body Bathing: Bed level;Maximal assistance   Upper Body Dressing : Bed level;Set up   Lower Body Dressing: Bed level;Maximal assistance     Toilet Transfer Details (indicate cue type and reason): patient is +2 sitting EOB with poor trunk control. transfer not attempted at this time. Toileting- Clothing Manipulation and Hygiene: Total assistance;Bed level  Vision   Vision Assessment?: No apparent visual deficits            Pertinent Vitals/Pain Pain  Assessment Pain Assessment: 0-10 Pain Score: 10-Worst pain ever Pain Location: back Pain Descriptors / Indicators: Grimacing, Sharp Pain Intervention(s): Limited activity within patient's tolerance, Premedicated before session, Monitored during session, Repositioned     Extremity/Trunk Assessment Upper Extremity Assessment Upper Extremity Assessment: Overall WFL for tasks assessed   Lower Extremity Assessment Lower Extremity Assessment: Defer to PT evaluation (flaccid BLEs)   Cervical / Trunk Assessment Cervical / Trunk Assessment: Normal   Communication Communication Communication: Prefers language other than English   Cognition Arousal/Alertness: Awake/alert Behavior During Therapy: Flat affect Overall Cognitive Status: Difficult to assess                    Home Living Family/patient expects to be discharged to:: Skilled nursing facility Living Arrangements: Spouse/significant other;Children Available Help at Discharge: Family Type of Home: House Home Access: Level entry     Home Layout: One level               Home Equipment: Wheelchair - manual          Prior Functioning/Environment Prior Level of Function : Needs assist               ADLs Comments: patient has support for ADls from son prior level per chart review. patient was at SNF prior to this admission for rehab. plan to d/c back to SNF at time of d/c.        OT Problem List: Decreased activity tolerance;Impaired balance (sitting and/or standing);Decreased coordination;Decreased knowledge of precautions;Pain      OT Treatment/Interventions: Self-care/ADL training;Energy conservation;Therapeutic exercise;DME and/or AE instruction;Patient/family education;Balance training    OT Goals(Current goals can be found in the care plan section) Acute Rehab OT Goals Patient Stated Goal: none stated OT Goal Formulation: Patient unable to participate in goal setting Time For Goal Achievement:  11/15/22 Potential to Achieve Goals: Fair  OT Frequency: Min 2X/week    Co-evaluation PT/OT/SLP Co-Evaluation/Treatment: Yes Reason for Co-Treatment: For patient/therapist safety PT goals addressed during session: Mobility/safety with mobility OT goals addressed during session: ADL's and self-care      AM-PAC OT "6 Clicks" Daily Activity     Outcome Measure Help from another person eating meals?: A Little Help from another person taking care of personal grooming?: A Little Help from another person toileting, which includes using toliet, bedpan, or urinal?: Total Help from another person bathing (including washing, rinsing, drying)?: Total Help from another person to put on and taking off regular upper body clothing?: A Little Help from another person to put on and taking off regular lower body clothing?: A Lot 6 Click Score: 13   End of Session Nurse Communication: Mobility status  Activity Tolerance: Patient limited by pain Patient left: in bed;with call bell/phone within reach;with bed alarm set  OT Visit Diagnosis: Unsteadiness on feet (R26.81);Other abnormalities of gait and mobility (R26.89);Muscle weakness (generalized) (M62.81);Pain                Time: 5784-6962 OT Time Calculation (min): 18 min Charges:  OT General Charges $OT Visit: 1 Visit OT Evaluation $OT Eval Moderate Complexity: 1 Mod  Crews Mccollam OTR/L, MS Acute Rehabilitation Department Office# [phone]   Willa Rough 11/01/2022, 1:03 PM

## 2022-11-02 DIAGNOSIS — Z515 Encounter for palliative care: Secondary | ICD-10-CM | POA: Diagnosis not present

## 2022-11-02 DIAGNOSIS — C221 Intrahepatic bile duct carcinoma: Secondary | ICD-10-CM | POA: Diagnosis not present

## 2022-11-02 DIAGNOSIS — I9589 Other hypotension: Secondary | ICD-10-CM | POA: Diagnosis not present

## 2022-11-02 DIAGNOSIS — R52 Pain, unspecified: Secondary | ICD-10-CM | POA: Diagnosis not present

## 2022-11-02 DIAGNOSIS — T83511A Infection and inflammatory reaction due to indwelling urethral catheter, initial encounter: Secondary | ICD-10-CM | POA: Diagnosis not present

## 2022-11-02 DIAGNOSIS — G8220 Paraplegia, unspecified: Secondary | ICD-10-CM | POA: Diagnosis not present

## 2022-11-02 LAB — URINE CULTURE: Culture: >=100000 — AB

## 2022-11-02 LAB — CBC
HCT: 36.4 % — ABNORMAL LOW (ref 39.0–52.0)
Hemoglobin: 12.1 g/dL — ABNORMAL LOW (ref 13.0–17.0)
MCH: 32.7 pg (ref 26.0–34.0)
MCHC: 33.2 g/dL (ref 30.0–36.0)
MCV: 98.4 fL (ref 80.0–100.0)
Platelets: 56 10*3/uL — ABNORMAL LOW (ref 150–400)
RBC: 3.7 MIL/uL — ABNORMAL LOW (ref 4.22–5.81)
RDW: 16.5 % — ABNORMAL HIGH (ref 11.5–15.5)
WBC: 6.7 10*3/uL (ref 4.0–10.5)
nRBC: 0 % (ref 0.0–0.2)

## 2022-11-02 LAB — RENAL FUNCTION PANEL
Albumin: 2.3 g/dL — ABNORMAL LOW (ref 3.5–5.0)
Anion gap: 5 (ref 5–15)
BUN: 19 mg/dL (ref 8–23)
CO2: 25 mmol/L (ref 22–32)
Calcium: 7.8 mg/dL — ABNORMAL LOW (ref 8.9–10.3)
Chloride: 107 mmol/L (ref 98–111)
Creatinine, Ser: 0.51 mg/dL — ABNORMAL LOW (ref 0.61–1.24)
GFR, Estimated: >60 mL/min (ref >60)
Glucose, Bld: 175 mg/dL — ABNORMAL HIGH (ref 70–99)
Phosphorus: 1.8 mg/dL — ABNORMAL LOW (ref 2.5–4.6)
Potassium: 3.2 mmol/L — ABNORMAL LOW (ref 3.5–5.1)
Sodium: 137 mmol/L (ref 135–145)

## 2022-11-02 LAB — MAGNESIUM: Magnesium: 2.1 mg/dL (ref 1.7–2.4)

## 2022-11-02 MED ORDER — SODIUM CHLORIDE 0.9% FLUSH
10.0000 mL | Freq: Two times a day (BID) | INTRAVENOUS | Status: DC
  Administered 2022-11-02: 20 mL

## 2022-11-02 MED ORDER — POTASSIUM PHOSPHATES 15 MMOLE/5ML IV SOLN
30.0000 mmol | Freq: Once | INTRAVENOUS | Status: AC
  Administered 2022-11-02: 30 mmol via INTRAVENOUS
  Filled 2022-11-02: qty 10

## 2022-11-02 MED ORDER — POTASSIUM CHLORIDE CRYS ER 20 MEQ PO TBCR
40.0000 meq | EXTENDED_RELEASE_TABLET | ORAL | Status: AC
  Administered 2022-11-02 (×2): 40 meq via ORAL
  Filled 2022-11-02 (×2): qty 2

## 2022-11-02 MED ORDER — SODIUM CHLORIDE 0.9% FLUSH: 10.0000 mL | INTRAVENOUS | Status: DC | PRN

## 2022-11-02 NOTE — Progress Notes (Signed)
Palliative Medicine Progress Note   Patient Name: Jason Lyons       Date: 11/02/2022 DOB: Jul 25, 1956  Age: 67 y.o. MRN#: [id number] Attending Physician: Eugenie Filler, MD Primary Care Physician: Pcp, No Admit Date: 10/30/2022  Reason for Consultation/Follow-up: {Reason for Consult:23484}  HPI/Patient Profile: 67 y.o. male  with past medical history of cholangiocarcinoma metastatic to bone. He follows with oncology at [facility] and has been receiving chemotherapy. He was recently admitted to [facility] on 10/12/22 with chest and back pain and found to have a T6 spinal metastasis as well as new scattered pulmonary nodules. There was concern for cord compression. He was seen by neurosurgery and deemed not a surgical candidate. He was treated with palliative radiation, decadron, and pain medication. He was discharged to SNF on 10/29/22 but they did not have pain medication and he presented back to the ED 10/30/2022 with uncontrolled back pain.    Palliative Medicine was consulted for goals of care and pain management.   Subjective: ***  Objective:  Physical Exam Constitutional:      General: He is not in acute distress.    Appearance: He is ill-appearing.  Pulmonary:     Effort: Pulmonary effort is normal.  Neurological:     Mental Status: He is alert.             Vital Signs: BP 122/70 (BP Location: Left Arm)   Pulse (!) 52   Temp 98.5 F (36.9 C) (Oral)   Resp 17   Ht '5\' 5"'$  (1.651 m)   Wt 69 kg   SpO2 99%   BMI 25.31 kg/m  SpO2: SpO2: 99 % O2 Device: O2 Device: Room Air O2 Flow Rate:    Intake/output summary:  Intake/Output Summary (Last 24 hours) at 11/02/2022 1738 Last data filed at 11/02/2022 1500 Gross per 24 hour  Intake 2744.07 ml  Output 1800 ml  Net 944.07 ml     LBM: Last BM Date : 10/31/22     Palliative Assessment/Data: ***     Palliative Medicine Assessment & Plan   Assessment: Principal Problem:   Hypotension Active Problems:   Pulmonary embolism (HCC)   Elevated LFTs   Other constipation   Cancer of biliary tract (HCC)   Paraplegia (HCC)   Malnutrition of moderate degree   Acute urinary  retention   Leukocytosis   Pyuria   Hyponatremia   Thrombocytopenia (HCC)   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)   Urinary tract infection associated with indwelling urethral catheter (HCC)   Intractable pain   Pressure injury of skin    Recommendations/Plan: ***  Symptom Management:  Oxycontin 20 mg BID Neurontin 100 mg at bedtime Continue oxycodone IR 10 mg every 4 hours as needed for breakthrough pain Continue dilaudid 1 mg IV every 3 hours as needed for severe pain Continue bowel regiman  Goals of Care and Additional Recommendations: Limitations on Scope of Treatment: {Recommended Scope and Preferences:21019}  Code Status:   Prognosis:  {Palliative Care Prognosis:23504}  Discharge Planning: {Palliative dispostion:23505}  Care plan was discussed with ***  Thank you for allowing the Palliative Medicine Team to assist in the care of this patient.   ***   Lavena Bullion, NP   Please contact Palliative Medicine Team phone at [phone] for questions and concerns.  For individual providers, please see AMION.

## 2022-11-02 NOTE — TOC Progression Note (Signed)
Transition of Care [facility]) - Progression Note    Patient Details  Name: Jason Lyons MRN: [id number] Date of Birth: [birth year]  Transition of Care [facility]) CM/SW Contact  Leeroy Cha, RN Phone Number: 11/02/2022, 10:05 AM  Clinical Narrative:    Patient is not from home but from [place].  Flk2 updated and sent to [place] for consideration.   Expected Discharge Plan: Home/Self Care Barriers to Discharge: Continued Medical Work up  Expected Discharge Plan and Services   Discharge Planning Services: CM Consult   Living arrangements for the past 2 months: Single Family Home                                       Social Determinants of Health (SDOH) Interventions SDOH Screenings   Food Insecurity: No Food Insecurity (11/01/2022)  Housing: Low Risk  (11/01/2022)  Transportation Needs: No Transportation Needs (11/01/2022)  Utilities: Not At Risk (11/01/2022)  Tobacco Use: Medium Risk (10/30/2022)    Readmission Risk Interventions   No data to display

## 2022-11-02 NOTE — NC FL2 (Signed)
[place] LEVEL OF CARE FORM     IDENTIFICATION  Patient Name: Jason Lyons Birthdate: 1956/06/17 Sex: male Admission Date (Current Location): 10/30/2022  [facility] and [place] Number:  Herbalist and Address:  [facility],  [place] [address] [place]      Provider Number:    Attending Physician Name and Address:  Eugenie Filler, MD  Relative Name and Phone Number:       Current Level of Care: Hospital Recommended Level of Care: [place] Prior Approval Number:    Date Approved/Denied:   PASRR Number: [id number]  Discharge Plan: SNF    Current Diagnoses: Patient Active Problem List   Diagnosis Date Noted   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   Urinary tract infection associated with indwelling urethral catheter ([place]) 10/31/2022   Intractable pain 10/31/2022   Pressure injury of skin 10/31/2022   Hypotension 10/30/2022   Pyuria 10/30/2022   Hyponatremia 10/30/2022   Thrombocytopenia ([place]) 10/30/2022   Spinal cord compression ([place]) 10/27/2022   AKI (acute kidney injury) ([place]) 10/27/2022   Acute urinary retention 10/27/2022   Leukocytosis 10/27/2022   Metastasis to spinal column ([place]) 10/27/2022   Malnutrition of moderate degree 10/16/2022   Lower extremity weakness 10/13/2022   Paraplegia ([place]) 10/13/2022   Pancytopenia, acquired ([place]) 07/31/2021   Financial difficulties 06/02/2021   Goals of care, counseling/discussion 05/21/2021   Cancer of biliary tract ([place]) 05/20/2021   Metastatic cancer to spine [facility])    Other constipation    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    Pulmonary embolism ([place]) 05/13/2021   Liver mass, right lobe 05/13/2021   Elevated LFTs 05/13/2021    Orientation RESPIRATION BLADDER Height & Weight     Self, Time, Situation, Place  Normal Continent Weight: 69 kg Height:  '5\' 5"'$  (165.1 cm)  BEHAVIORAL SYMPTOMS/MOOD NEUROLOGICAL BOWEL  NUTRITION STATUS      Continent Diet (regular)  AMBULATORY STATUS COMMUNICATION OF NEEDS Skin   Extensive Assist Verbally Normal                       Personal Care Assistance Level of Assistance  Bathing, Feeding, Dressing Bathing Assistance: Limited assistance Feeding assistance: Limited assistance Dressing Assistance: Limited assistance     Functional Limitations Info  Sight, Hearing, Speech Sight Info: Adequate Hearing Info: Adequate Speech Info: Adequate    SPECIAL CARE FACTORS FREQUENCY  PT (By licensed PT), OT (By licensed OT)     PT Frequency: x5 weekly OT Frequency: x5weekly            Contractures Contractures Info: Not present    Additional Factors Info  Code Status (FULL) Code Status Info: FULL Allergies Info: N/A           Current Medications (11/02/2022):  This is the current hospital active medication list Current Facility-Administered Medications  Medication Dose Route Frequency Provider Last Rate Last Admin   acetaminophen (TYLENOL) tablet 650 mg  650 mg Oral Q6H PRN Doutova, Anastassia, MD       Or   acetaminophen (TYLENOL) suppository 650 mg  650 mg Rectal Q6H PRN Doutova, Anastassia, MD       acetaminophen (TYLENOL) tablet 1,000 mg  1,000 mg Oral TID Eugenie Filler, MD   1,000 mg at 11/02/22 0824   alum & mag hydroxide-simeth (MAALOX/MYLANTA) 200-200-20 MG/5ML suspension 30 mL  30 mL Oral Q6H PRN Eugenie Filler, MD  And   lidocaine (XYLOCAINE) 2 % viscous mouth solution 15 mL  15 mL Oral Q6H PRN Eugenie Filler, MD       apixaban Arne Cleveland) tablet 5 mg  5 mg Oral BID Toy Baker, MD   5 mg at 11/02/22 6568   bisacodyl (DULCOLAX) suppository 10 mg  10 mg Rectal Daily PRN Toy Baker, MD       cefTRIAXone (ROCEPHIN) 2 g in sodium chloride 0.9 % 100 mL IVPB  2 g Intravenous Q24H Doutova, Anastassia, MD 200 mL/hr at 11/01/22 2022 2 g at 11/01/22 2022   Chlorhexidine Gluconate Cloth 2 % PADS 6 each  6 each Topical  Daily Eugenie Filler, MD   6 each at 11/01/22 1014   feeding supplement (ENSURE ENLIVE / ENSURE PLUS) liquid 237 mL  237 mL Oral TID BM Eugenie Filler, MD   237 mL at 11/02/22 0834   gabapentin (NEURONTIN) capsule 100 mg  100 mg Oral QHS Elie Confer B, NP   100 mg at 11/01/22 2154   hydrocortisone sodium succinate (SOLU-CORTEF) 100 MG injection 100 mg  100 mg Intravenous Q8H Eugenie Filler, MD   100 mg at 11/02/22 0406   HYDROmorphone (DILAUDID) injection 1 mg  1 mg Intravenous Q3H PRN Eugenie Filler, MD   1 mg at 11/02/22 1275   methocarbamol (ROBAXIN) 500 mg in dextrose 5 % 50 mL IVPB  500 mg Intravenous Q6H PRN Doutova, Anastassia, MD       oxyCODONE (Oxy IR/ROXICODONE) immediate release tablet 10 mg  10 mg Oral Q4H PRN Eugenie Filler, MD   10 mg at 11/02/22 0405   oxyCODONE (OXYCONTIN) 12 hr tablet 20 mg  20 mg Oral Q12H Mc[place]quham, Gregary Signs B, NP   20 mg at 11/02/22 0823   pantoprazole (PROTONIX) EC tablet 40 mg  40 mg Oral BID AC Eugenie Filler, MD   40 mg at 11/02/22 0824   polyethylene glycol (MIRALAX / GLYCOLAX) packet 17 g  17 g Oral Daily Eugenie Filler, MD   17 g at 11/02/22 1700   senna-docusate (Senokot-S) tablet 1 tablet  1 tablet Oral BID Toy Baker, MD   1 tablet at 11/02/22 1749   thiamine (VITAMIN B1) tablet 100 mg  100 mg Oral Daily Karren Cobble, RPH   100 mg at 11/02/22 4496     Discharge Medications: Please see discharge summary for a list of discharge medications.  Relevant Imaging Results:  Relevant Lab Results:   Additional Information ssn:[id number]  Leeroy Cha, RN

## 2022-11-02 NOTE — Progress Notes (Signed)
PROGRESS NOTE    Jason Lyons  IRW:[id number] DOB: 04/02/1956 DOA: 10/30/2022 PCP: Pcp, No    Chief Complaint  Patient presents with   Back Pain    Brief Narrative:  HPI per Dr. Frederica Kuster Brouillet is a 67 y.o. male with medical history significant of cholangiocarcinoma, metastatic to bone .  Followed at [facility], metastatic disease to T4 and T6 resulting in paraplegia with cord compression at T6 level and encroachment on the thecal sac, urinary retention status post Foley catheter malnutrition and thrombocytopenia history of PE on Eliquis     Presented with   not controlled back pain Pt with known adenocarcinoma of the biliary tract diagnosed in 05/13/2021 followed by Dr. Alvy Bimler on chemotherapy with cisplatin and gemcitabine  But now he os followed at [place] pathological fracture T4 status post XRT  was seen in the [facility] ED and 09/30/2019 because of pain in the back as well as numbness of bilateral hands and feet.  He was given oxycodone and was discharged home and was noted to not be able to pass stool for the last 3 weeks but still had control of his urine.  We was readmitted 10/12/2022 at [facility] with right-sided chest pain that radiated to the back and was found to have a T6 spinal metastasis as well as new scattered pulmonary nodules.  Neurosurgery was consulted and did not feel the patient was an operative candidate and he was given Decadron and transferred over to [facility] for consideration of radiation therapy which would be palliative.  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 He was treated with radiation , decadron and pain management Pt was having urinary retention and had Foley Catheter inserted After completing his radiation therapy on 1/16 he was discahrged to SNF on 1/18 of January Radiation oncology has recommended tapering of the Decadron  He has known LFT elevations and RUQ [place] was done showing  "Heterogeneous nodular lesions in the liver measuring up to 3.6 cm and 2.9 cm, suspicious for metastatic disease,    At the time of discharge patient continued to have intermittent chest pain radiating to his back which was felt to be noncardiac His pain regimen included oxycodone IR 5 mg every 4 hours for moderate pain and Dilaudid 1 mg every 4 hours for severe pain while at [facility] at the discharge patient was prescribed Dilaudid 1 mg every 6 hours as needed for pain He was brought back today from [place] because they did not have pain medication   Assessment & Plan:   Principal Problem:   Hypotension Active Problems:   Pulmonary embolism (HCC)   Elevated LFTs   Other constipation   Cancer of biliary tract (HCC)   Paraplegia (HCC)   Malnutrition of moderate degree   Acute urinary retention   Leukocytosis   Pyuria   Hyponatremia   Thrombocytopenia ([place])   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 -Urinalysis done concerning for UTI, urine cultures > 100,000 colonies of E. coli. -Catheter exchanged. -Saline lock IV fluids.  -Status post IV albumin. -Patient received a dose of stress dose steroids as he  did not receive his Decadron at SNF and felt may have contributed to hypotension. -Continue empiric IV Rocephin. -Discontinued Decadron, and patient placed on Solu-Cortef 100 mg IV every 8 hours with improvement with hypotension. -Could likely decrease Solu-Cortef to 50 mg IV every 8 hours tomorrow and potentially continue to taper down back to Decadron taper as recommended by radiation oncology from the last note during prior  hospitalization. -Supportive care.  2.  E. coli UTI -Urinalysis concerning for UTI. -Urine cultures >100,000 colonies of E. coli. -Foley catheter exchanged. -Continue IV Rocephin.   3.  Pulmonary embolism -Continue Eliquis.   4.  Severe constipation -Noted on KUB. -Patient was on chronic pain medications, patient with paraplegia, cord compression at T6 with encroachment of the thecal sac, was on pain medication. -Patient given milk of molasses enema on presentation, currently on bowel regimen with good bowel movement. -Continue MiraLAX daily, Senokot-S twice daily.  5.  Paraplegia and cord compression at T6 with encroachment of thecal sac -Noted during prior hospitalization 10/12/2022-10/28/2022. -During hospitalization patient seen by neurosurgery felt not a surgical candidate due to failure of systemic treatment and length of time with lower extremity weakness and recommended palliative radiation per oncology. -Patient completed palliative radiation during that hospitalization and started on Decadron and was on a Decadron taper on discharge however per admitting physician Decadron not available at SNF. -Patient felt not to CIR candidate. -During prior hospitalization Dr. Verlon Au had discussed with patient oncologist Dr. Patsey Berthold and she had nothing further to add and recommended follow-up with his oncologist at [facility]. -Patient with ongoing uncontrolled pain at this time. -Due to soft blood pressure need to monitor pain regimen closely. -Patient placed on stress dose IV steroids with improvement with soft blood pressure.  -Continue OxyContin 20 mg twice daily, oxycodone 10 mg every 4 hours breakthrough pain.  Continue Dilaudid 1 mg IV every 3 hours as needed severe pain.   -Continue scheduled Tylenol 1000 mg 3 times daily. -Neurontin 100 mg daily added per palliative care. -Discontinued fentanyl. -Palliative care consulted and following for symptom management and goals of care.  6.   Metastatic adenocarcinoma biliary tract diagnosed 05/30/2021 -Will need outpatient follow-up with primary oncologist at [facility] on discharge. -Patient with a poor prognosis. -Palliative care consulted on patient, palliative care to have a video conference with patient family and primary oncologist on Thursday, 11/04/2022.   -Continue current pain management.   -Palliative care following and appreciate input and recommendations.   7.  Acute urinary retention -Likely secondary to cord compression/#5. -Foley catheter removed however patient failed voiding trial and Foley catheter placed back in.   -Likely secondary to cord compression noted in problem #5.   -Outpatient follow-up with urology.    8.  Anterior chest/scapular pain -Noted during recent hospitalization, patient also noted with intermittent chest pain left-sided chest pain radiating around the side to the back. -Recent cardiac workup negative. -Continue PPI, GI cocktail as needed.  -Pain management. -See #5.  9.  Moderate malnutrition in the context of chronic illness, POA -Nutritional supplementation.  10.  E. coli UTI  -Patient with urinary retention discharged with Foley catheter, Foley catheter removed-patient failed voiding trial and Foley catheter placed back in.   -Continue IV Rocephin and treat for total of 3 to 5 days.   11.  Hyponatremia -Likely secondary to hypovolemic hyponatremia, improved with hydration.  12.  Leukocytosis -In the setting of recent steroid use. -Urinalysis concerning for UTI, urine cultures > 100,000 colonies of  E. coli. -Chest x-ray negative for any acute infiltrate.  Afebrile. -Leukocytosis trending down. -Continue IV Rocephin and treat for total of 5 days.   13.  Thrombocytopenia -In the setting of chronic illness/metastatic cancer. -Patient with no overt bleeding. -Platelet count seems to be stabilizing. -Continue empiric antibiotics for concern for UTI. -Follow-up.  14.   Hypokalemia/hypophosphatemia -Replete. -Repeat labs in the AM.  15. Pressure injury Pressure Injury 10/31/22 Buttocks Right Stage 2 -  Partial thickness loss of dermis presenting as a shallow open injury with a red, pink wound bed without slough. (Active)  10/31/22 0519  Location: Buttocks  Location Orientation: Right  Staging: Stage 2 -  Partial thickness loss of dermis presenting as a shallow open injury with a red, pink wound bed without slough.  Wound Description (Comments):   Present on Admission: Yes        DVT prophylaxis: Eliquis Code Status: Full Family Communication: Updated patient.  No family at bedside. Disposition: SNF once medically stable and able to confirm SNF has all patient's medications.  Will likely need outpatient follow-up with palliative care and primary oncologist..  Status is: Inpatient The patient will require care spanning > 2 midnights and should be moved to inpatient because: Inpatient due to severity of illness.   Consultants:  Palliative care: Royann Shivers 11/01/2022  Procedures:  Abdominal x-ray 10/30/2022 Chest x-ray 10/30/2022  Antimicrobials:  IV Rocephin 10/30/2022>>>>   Subjective: Patient laying in bed, complaining of chest and back pain.  Current pain at a 9/10.  Patient states adjustment in pain medication has been helping with his pain.  No shortness of breath.  No abdominal pain.  iPad interpreter used.  Objective: Vitals:   11/01/22 1302 11/01/22 2129 11/02/22 0412 11/02/22 0527  BP:  118/71  122/70  Pulse:  (!) 54  (!) 52  Resp:  17  17  Temp:  97.8 F (36.6 C)  98.5 F (36.9 C)  TempSrc:  Oral  Oral  SpO2:  99%  99%  Weight:   69 kg   Height: '5\' 5"'$  (1.651 m)       Intake/Output Summary (Last 24 hours) at 11/02/2022 1142 Last data filed at 11/02/2022 0500 Gross per 24 hour  Intake 2953.28 ml  Output 1150 ml  Net 1803.28 ml    Filed Weights   11/02/22 0412  Weight: 69 kg    Examination:  General exam:  NAD. Respiratory system: CTAB.  No wheezes, no crackles, no rhonchi.  Fair air movement.  Speaking in full sentences.  Normal respiratory effort.  Cardiovascular system: Regular rate rhythm no murmurs rubs or gallops.  No JVD.  No lower extremity edema. Gastrointestinal system: Abdomen is soft, nontender, nondistended, positive bowel sounds.  No rebound.  No guarding.  Central nervous system: Alert and oriented.  Paraplegia.  GU: Penis swollen. Extremities: Symmetric 5 x 5 power. Skin: No rashes, lesions or ulcers Psychiatry: Judgement and insight appear normal. Mood & affect appropriate.     Data Reviewed: I have personally reviewed following labs and imaging studies  CBC: Recent Labs  Lab 10/27/22 0433 10/28/22 0435 10/30/22 1900 10/31/22 0702 11/01/22 0549 11/02/22 1010  WBC 17.0* 14.6* 18.4* 12.7* 8.1 6.7  NEUTROABS 15.1* 13.3* 17.4*  --  7.6  --   HGB 13.3 13.1 14.6 13.6 11.2* 12.1*  HCT 39.7 38.8* 43.1 40.3 33.6* 36.4*  MCV 98.3 97.7 96.9 97.6 98.2 98.4  PLT 120* 108* 82* 65* 51* 56*     Basic Metabolic Panel:  Recent Labs  Lab 10/27/22 0433 10/28/22 0435 10/30/22 1900 10/30/22 2212 10/31/22 0702 11/01/22 0549 11/02/22 1010  NA 136 133* 132*  --  139 136 137  K 4.5 4.4 3.7  --  3.9 4.0 3.2*  CL 102 101 97*  --  108 111 107  CO2 '28 27 27  '$ --  '27 22 25  '$ GLUCOSE 129* 149* 106*  --  104* 114* 175*  BUN 35* 33* 28*  --  '21 20 19  '$ CREATININE 0.63 0.55* 0.58*  --  0.47* 0.44* 0.51*  CALCIUM 8.4* 8.2* 8.0*  --  7.9* 7.7* 7.8*  MG 2.2  --   --  1.9 2.4 2.1 2.1  PHOS 3.7  --   --  3.3 3.3  --  1.8*     GFR: Estimated Creatinine Clearance: 76.9 mL/min (A) (by C-G formula based on SCr of 0.51 mg/dL (L)).  Liver Function Tests: Recent Labs  Lab 10/27/22 0433 10/30/22 1900 10/31/22 0702 11/01/22 0549 11/02/22 1010  AST 38 48* 33 24  --   ALT 122* 148* 117* 71*  --   ALKPHOS 153* 184* 156* 101  --   BILITOT 0.6 1.0 0.8 0.5  --   PROT 5.2* 5.9* 5.3* 4.4*  --    ALBUMIN 2.6* 2.7* 2.2* 2.1* 2.3*     CBG: No results for input(s): "GLUCAP" in the last 168 hours.    Recent Results (from the past 240 hour(s))  Urine Culture     Status: Abnormal   Collection Time: 10/30/22  8:30 PM   Specimen: Urine, Catheterized  Result Value Ref Range Status   Specimen Description   Final    URINE, CATHETERIZED Performed at [facility] [address], [place], [place] [address]    Special Requests   Final    NONE Performed at [facility], [place] [address] [place], [place] [address]    Culture >=100,000 COLONIES/mL ESCHERICHIA COLI (A)  Final   Report Status 11/02/2022 FINAL  Final   Organism ID, Bacteria ESCHERICHIA COLI (A)  Final      Susceptibility   Escherichia coli - MIC*    AMPICILLIN >=32 RESISTANT Resistant     CEFAZOLIN <=4 SENSITIVE Sensitive     CEFEPIME <=0.12 SENSITIVE Sensitive     CEFTRIAXONE <=0.25 SENSITIVE Sensitive     CIPROFLOXACIN <=0.25 SENSITIVE Sensitive     GENTAMICIN <=1 SENSITIVE Sensitive     IMIPENEM <=0.25 SENSITIVE Sensitive     NITROFURANTOIN <=16 SENSITIVE Sensitive     TRIMETH/SULFA <=20 SENSITIVE Sensitive     AMPICILLIN/SULBACTAM 16 INTERMEDIATE Intermediate     PIP/TAZO <=4 SENSITIVE Sensitive     * >=100,000 COLONIES/mL ESCHERICHIA COLI  Culture, blood (routine x 2)     Status: None (Preliminary result)   Collection Time: 10/30/22  9:02 PM   Specimen: BLOOD  Result Value Ref Range Status   Specimen Description   Final    BLOOD Performed at [facility] [address] [place], [place] [address]    Special Requests   Final    BOTTLES DRAWN AEROBIC AND ANAEROBIC Blood Culture adequate volume Performed at [facility] [address], [place], [place] [address]    Culture   Final    NO GROWTH 2 DAYS Performed at [facility] [address] [place],  [address]    Report Status PENDING  Incomplete  Culture,  blood (routine x 2)  Status: None (Preliminary result)   Collection Time: 10/31/22  7:02 AM   Specimen: BLOOD RIGHT HAND  Result Value Ref Range Status   Specimen Description   Final    BLOOD RIGHT HAND Performed at [facility], [place] [address], [place], [place] [address]    Special Requests   Final    AEROBIC BOTTLE ONLY Blood Culture adequate volume Performed at [facility] [address] [place], [place] [address]    Culture   Final    NO GROWTH 2 DAYS Performed at [facility] [address] [place], [place] [address]    Report Status PENDING  Incomplete  MRSA Next Gen by PCR, Nasal     Status: None   Collection Time: 11/01/22  1:03 PM   Specimen: Nasal Mucosa; Nasal Swab  Result Value Ref Range Status   MRSA by PCR Next Gen NOT DETECTED NOT DETECTED Final    Comment: (NOTE) The GeneXpert MRSA Assay (FDA approved for NASAL specimens only), is one component of a comprehensive MRSA colonization surveillance program. It is not intended to diagnose MRSA infection nor to guide or monitor treatment for MRSA infections. Test performance is not FDA approved in patients less than 49 years old. Performed at [facility], [place] [address], [place], [place] [address]          Radiology Studies: No results found.      Scheduled Meds:  acetaminophen  1,000 mg Oral TID   apixaban  5 mg Oral BID   Chlorhexidine Gluconate Cloth  6 each Topical Daily   feeding supplement  237 mL Oral TID BM   gabapentin  100 mg Oral QHS   hydrocortisone sod succinate (SOLU-CORTEF) inj  100 mg Intravenous Q8H   oxyCODONE  20 mg Oral Q12H   pantoprazole  40 mg Oral BID AC   polyethylene glycol  17 g Oral Daily   potassium chloride  40 mEq Oral Q4H   senna-docusate  1 tablet Oral BID   sodium chloride flush  10-40 mL Intracatheter Q12H   thiamine  100 mg Oral Daily   Continuous Infusions:  cefTRIAXone (ROCEPHIN)  IV 2 g (11/01/22 2022)    methocarbamol (ROBAXIN) IV     potassium PHOSPHATE IVPB (in mmol)       LOS: 2 days    Time spent: 40 minutes    Irine Seal, MD [facility]   To contact the attending provider between 7A-7P or the covering provider during after hours 7P-7A, please log into the web site [email or web address] and access using universal [place] password for that web site. If you do not have the password, please call the hospital operator.  11/02/2022, 11:42 AM

## 2022-11-03 DIAGNOSIS — I9589 Other hypotension: Secondary | ICD-10-CM | POA: Diagnosis not present

## 2022-11-03 DIAGNOSIS — T83511A Infection and inflammatory reaction due to indwelling urethral catheter, initial encounter: Secondary | ICD-10-CM | POA: Diagnosis not present

## 2022-11-03 DIAGNOSIS — N390 Urinary tract infection, site not specified: Secondary | ICD-10-CM | POA: Diagnosis not present

## 2022-11-03 DIAGNOSIS — E861 Hypovolemia: Secondary | ICD-10-CM | POA: Diagnosis not present

## 2022-11-03 LAB — CBC
HCT: 34.7 % — ABNORMAL LOW (ref 39.0–52.0)
Hemoglobin: 11.9 g/dL — ABNORMAL LOW (ref 13.0–17.0)
MCH: 33.2 pg (ref 26.0–34.0)
MCHC: 34.3 g/dL (ref 30.0–36.0)
MCV: 96.9 fL (ref 80.0–100.0)
Platelets: 51 10*3/uL — ABNORMAL LOW (ref 150–400)
RBC: 3.58 MIL/uL — ABNORMAL LOW (ref 4.22–5.81)
RDW: 16.5 % — ABNORMAL HIGH (ref 11.5–15.5)
WBC: 6.2 10*3/uL (ref 4.0–10.5)
nRBC: 0 % (ref 0.0–0.2)

## 2022-11-03 LAB — RENAL FUNCTION PANEL
Albumin: 2.2 g/dL — ABNORMAL LOW (ref 3.5–5.0)
Anion gap: 8 (ref 5–15)
BUN: 18 mg/dL (ref 8–23)
CO2: 27 mmol/L (ref 22–32)
Calcium: 7.9 mg/dL — ABNORMAL LOW (ref 8.9–10.3)
Chloride: 105 mmol/L (ref 98–111)
Creatinine, Ser: 0.4 mg/dL — ABNORMAL LOW (ref 0.61–1.24)
GFR, Estimated: >60 mL/min (ref >60)
Glucose, Bld: 201 mg/dL — ABNORMAL HIGH (ref 70–99)
Phosphorus: 1.7 mg/dL — ABNORMAL LOW (ref 2.5–4.6)
Potassium: 3.7 mmol/L (ref 3.5–5.1)
Sodium: 140 mmol/L (ref 135–145)

## 2022-11-03 LAB — MAGNESIUM: Magnesium: 2.2 mg/dL (ref 1.7–2.4)

## 2022-11-03 MED ORDER — K PHOS MONO-SOD PHOS DI & MONO 155-852-130 MG PO TABS
500.0000 mg | ORAL_TABLET | Freq: Two times a day (BID) | ORAL | Status: AC
  Administered 2022-11-03 (×2): 500 mg via ORAL
  Filled 2022-11-03 (×2): qty 2

## 2022-11-03 MED ORDER — HYDROCORTISONE SOD SUC (PF) 100 MG IJ SOLR
50.0000 mg | Freq: Three times a day (TID) | INTRAMUSCULAR | Status: DC
  Administered 2022-11-03 – 2022-11-04 (×2): 50 mg via INTRAVENOUS
  Filled 2022-11-03 (×3): qty 1

## 2022-11-03 MED ORDER — POLYETHYLENE GLYCOL 3350 17 G PO PACK
17.0000 g | PACK | Freq: Two times a day (BID) | ORAL | Status: DC
  Administered 2022-11-03 – 2022-11-04 (×2): 17 g via ORAL
  Filled 2022-11-03 (×2): qty 1

## 2022-11-03 NOTE — Progress Notes (Signed)
[facility] PROGRESS NOTE    Progress Note  Jason Lyons  DUK:[id number] DOB: [birth year] DOA: 10/30/2022 PCP: Pcp, No     Brief Narrative:   Jason Lyons is an 67 y.o. 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 urinary retention status post Foley catheter and thrombocytopenia with a history of PE on Eliquis Comes in with uncontrollable pain known to have a pathologic fracture of T4 status post radiation at [facility] on 09/30/2023 readmitted on 10/12/2022 at [facility] with right-sided chest pain radiating to her back found to have T6 spinal metastases as well as new scattered pulmonary nodules neurosurgery was consulted and did not feel the patient was in a Perative candidate was given Decadron and transferred to [facility] to consider radiation therapy as palliative measures.  Started on radiation continue on Decadron and pain management.  Started having urinary retention Foley catheter inserted.  After completing his radiation therapy on 10/26/2022 was discharged to skilled on 10/28/2022 and an started Decadron taper.   Assessment/Plan:   Hypotension Multifactorial likely due to dehydration and probably UTI urine culture grew more than 100,000 colonies of E. coli. Her catheter was exchanged. Started on IV albumin to receive stress dose steroids and started on IV Rocephin. Continue to taper down Solu-Cortef.  Can potentially change to Decadron taper as recommended by radiation oncology.  E. coli UTI: Urine culture grew more than 100,000 colonies of E. coli on IV Rocephin. Continue IV Rocephin for 7 days.  Pulmonary embolism: Continue Eliquis.  Severe constipation: Continue MiraLAX p.o. twice daily and Senokot.  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 He completed palliative radiation during last hospitalization and started on Decadron, steroids were not available at skilled. Patient was not a candidate for inpatient rehab. Routing heart time controlling his pain palliative care was consulted he was placed on OxyContin twice a day Percocet 10 for breakthrough pain and IV Dilaudid as needed. Around 600 mg daily was added by palliative care. Fentanyl patch was discontinued. And palliative care following.  Metastatic cholangiocarcinoma diagnosed in 2022: Palliative care was consulted and is to have a video conference with the patient's family and oncologist on 11/04/2022. Continue current pain management.  Acute urinary retention: Felt to be likely secondary to cord compression currently with Foley catheter and will follow-up with urology if needed as an outpatient.  Anterior chest/scapular pain: Cardiac workup negative continue PPI and GI cocktail.  Moderate malnutrition in the context of chronic illness: Noted.  Hyponatremia: Likely due to hypovolemia resolved with hydration.  Thrombocytopenia: Likely due to chronic illness his platelets have remained relatively stable.  Electrolyte imbalance hypokalemia/hypophosphatemia: Try to keep potassium greater than 4 and phosphorus greater than 3. Phosphorus is low replete orally recheck tomorrow morning.  Significant results are present on admission stage II. RN Pressure Injury Documentation: Pressure Injury 10/31/22 Buttocks Right Stage 2 -  Partial thickness loss of dermis presenting as a shallow open injury with a red, pink wound bed without slough. (Active)  10/31/22 0519  Location: Buttocks  Location Orientation: Right  Staging: Stage 2 -  Partial thickness loss of dermis presenting as a shallow open injury with a red, pink wound bed without slough.  Wound Description (Comments):   Present on Admission: Yes  Dressing Type Foam - Lift dressing to assess site every shift 11/02/22 1944   DVT prophylaxis: lovenox Family Communication:none Status is: Inpatient Remains inpatient appropriate because: Uncontrollable pain    Code Status:     Code Status Orders  (From admission, onward)           Start     Ordered   10/30/22 2119  Full code  Continuous       Question:  By:  Answer:  Consent: discussion documented in EHR   10/30/22 2120           Code Status History     Date Active Date Inactive Code Status Order ID Comments User Context   10/13/2022 0536 10/29/2022 1450 Full Code [id number]  [name], MD ED   05/13/2021 2330 05/19/2021 2025 Full Code [id number]  Rise Patience, MD Inpatient      Advance Directive Documentation    Flowsheet Row Most Recent Value  Type of Advance Directive Healthcare Power of Attorney  Pre-existing out of facility DNR order (yellow form or pink MOST form) --  "MOST" Form in Place? --         IV Access:   Peripheral IV   Procedures and diagnostic studies:   No results found.   Medical Consultants:   None.   Subjective:    Jason Lyons relates his pain is relatively controlled  Objective:    Vitals:   11/02/22 0527 11/02/22 2105 11/03/22 0400 11/03/22 0504  BP: 122/70 (!) 147/80  126/77  Pulse: (!) 52 (!) 58  68  Resp: '17 20 12 18  '$ Temp: 98.5 F (36.9 C) 97.7 F (36.5 C)  97.6 F (36.4 C)  TempSrc: Oral Oral  Oral  SpO2: 99% 100%  97%  Weight:      Height:       SpO2: 97 %   Intake/Output Summary (Last 24 hours) at 11/03/2022 1213 Last data filed at 11/03/2022 1017 Gross per 24 hour  Intake 390.41 ml  Output 2450 ml  Net -2059.59 ml   Filed Weights   11/02/22 0412  Weight: 69 kg    Exam: General exam: In no acute distress. Respiratory system: Good air movement and clear to auscultation. Cardiovascular system: S1 & S2 heard, RRR. No JVD. Gastrointestinal system: Abdomen is nondistended, soft and  nontender.  Extremities: No pedal edema. Skin: No rashes, lesions or ulcers Psychiatry: Judgement and insight appear normal. Mood & affect appropriate.    Data Reviewed:    Labs: Basic Metabolic Panel: Recent Labs  Lab 10/30/22 1900 10/30/22 2212 10/31/22 0702 11/01/22 0549 11/02/22 1010 11/03/22 0354  NA 132*  --  139 136 137 140  K 3.7  --  3.9 4.0 3.2* 3.7  CL 97*  --  108 111 107 105  CO2 27  --  '27 22 25 27  '$ GLUCOSE 106*  --  104* 114* 175* 201*  BUN 28*  --  '21 20 19 18  '$ CREATININE 0.58*  --  0.47* 0.44* 0.51* 0.40*  CALCIUM 8.0*  --  7.9* 7.7* 7.8* 7.9*  MG  --  1.9 2.4 2.1 2.1 2.2  PHOS  --  3.3 3.3  --  1.8* 1.7*   GFR Estimated Creatinine Clearance: 76.9 mL/min (A) (by C-G formula based on SCr of 0.4 mg/dL (L)). Liver Function Tests: Recent Labs  Lab 10/30/22 1900 10/31/22 0702 11/01/22 0549 11/02/22 1010 11/03/22 0354  AST 48* 33 24  --   --  ALT 148* 117* 71*  --   --   ALKPHOS 184* 156* 101  --   --   BILITOT 1.0 0.8 0.5  --   --   PROT 5.9* 5.3* 4.4*  --   --   ALBUMIN 2.7* 2.2* 2.1* 2.3* 2.2*   No results for input(s): "LIPASE", "AMYLASE" in the last 168 hours. No results for input(s): "AMMONIA" in the last 168 hours. Coagulation profile Recent Labs  Lab 10/30/22 2212  INR 1.6*   COVID-19 Labs  No results for input(s): "DDIMER", "FERRITIN", "LDH", "CRP" in the last 72 hours.  Lab Results  Component Value Date   [place] NEGATIVE 05/13/2021    CBC: Recent Labs  Lab 10/28/22 0435 10/30/22 1900 10/31/22 0702 11/01/22 0549 11/02/22 1010 11/03/22 0354  WBC 14.6* 18.4* 12.7* 8.1 6.7 6.2  NEUTROABS 13.3* 17.4*  --  7.6  --   --   HGB 13.1 14.6 13.6 11.2* 12.1* 11.9*  HCT 38.8* 43.1 40.3 33.6* 36.4* 34.7*  MCV 97.7 96.9 97.6 98.2 98.4 96.9  PLT 108* 82* 65* 51* 56* 51*   Cardiac Enzymes: Recent Labs  Lab 10/30/22 2212  CKTOTAL 46*   BNP (last 3 results) No results for input(s): "PROBNP" in the last 8760 hours. CBG: No  results for input(s): "GLUCAP" in the last 168 hours. D-Dimer: No results for input(s): "DDIMER" in the last 72 hours. Hgb A1c: No results for input(s): "HGBA1C" in the last 72 hours. Lipid Profile: No results for input(s): "CHOL", "HDL", "LDLCALC", "TRIG", "CHOLHDL", "LDLDIRECT" in the last 72 hours. Thyroid function studies: No results for input(s): "TSH", "T4TOTAL", "T3FREE", "THYROIDAB" in the last 72 hours.  Invalid input(s): "FREET3" Anemia work up: No results for input(s): "VITAMINB12", "FOLATE", "FERRITIN", "TIBC", "IRON", "RETICCTPCT" in the last 72 hours. Sepsis Labs: Recent Labs  Lab 10/30/22 2049 10/30/22 2212 10/31/22 0702 11/01/22 0549 11/02/22 1010 11/03/22 0354  PROCALCITON  --  4.35  --   --   --   --   WBC  --   --  12.7* 8.1 6.7 6.2  LATICACIDVEN 1.0 0.9  --   --   --   --    Microbiology Recent Results (from the past 240 hour(s))  Urine Culture     Status: Abnormal   Collection Time: 10/30/22  8:30 PM   Specimen: Urine, Catheterized  Result Value Ref Range Status   Specimen Description   Final    URINE, CATHETERIZED Performed at [facility], [place] [address], [place], [place] [address]    Special Requests   Final    NONE Performed at [facility], [place] [address], [place], [place] [address]    Culture >=100,000 COLONIES/mL ESCHERICHIA COLI (A)  Final   Report Status 11/02/2022 FINAL  Final   Organism ID, Bacteria ESCHERICHIA COLI (A)  Final      Susceptibility   Escherichia coli - MIC*    AMPICILLIN >=32 RESISTANT Resistant     CEFAZOLIN <=4 SENSITIVE Sensitive     CEFEPIME <=0.12 SENSITIVE Sensitive     CEFTRIAXONE <=0.25 SENSITIVE Sensitive     CIPROFLOXACIN <=0.25 SENSITIVE Sensitive     GENTAMICIN <=1 SENSITIVE Sensitive     IMIPENEM <=0.25 SENSITIVE Sensitive     NITROFURANTOIN <=16 SENSITIVE Sensitive     TRIMETH/SULFA <=20 SENSITIVE Sensitive     AMPICILLIN/SULBACTAM 16 INTERMEDIATE Intermediate      PIP/TAZO <=4 SENSITIVE Sensitive     * >=100,000 COLONIES/mL ESCHERICHIA COLI  Culture, blood (  routine x 2)     Status: None (Preliminary result)   Collection Time: 10/30/22  9:02 PM   Specimen: BLOOD  Result Value Ref Range Status   Specimen Description   Final    BLOOD Performed at [facility] [address], [place], [place] [address]    Special Requests   Final    BOTTLES DRAWN AEROBIC AND ANAEROBIC Blood Culture adequate volume Performed at [facility] [address], [place], [place] [address]    Culture   Final    NO GROWTH 3 DAYS Performed at [facility], [place] [address] [place], [place] [address]    Report Status PENDING  Incomplete  Culture, blood (routine x 2)     Status: None (Preliminary result)   Collection Time: 10/31/22  7:02 AM   Specimen: BLOOD RIGHT HAND  Result Value Ref Range Status   Specimen Description   Final    BLOOD RIGHT HAND Performed at [facility], [place] [address], [place], [place] [address]    Special Requests   Final    AEROBIC BOTTLE ONLY Blood Culture adequate volume Performed at [facility] [address], [place], [place] [address]    Culture   Final    NO GROWTH 3 DAYS Performed at [facility], [place] [address], [place], [place] [address]    Report Status PENDING  Incomplete  MRSA Next Gen by PCR, Nasal     Status: None   Collection Time: 11/01/22  1:03 PM   Specimen: Nasal Mucosa; Nasal Swab  Result Value Ref Range Status   MRSA by PCR Next Gen NOT DETECTED NOT DETECTED Final    Comment: (NOTE) The GeneXpert MRSA Assay (FDA approved for NASAL specimens only), is one component of a comprehensive MRSA colonization surveillance program. It is not intended to diagnose MRSA infection nor to guide or monitor treatment for MRSA infections. Test performance is not FDA approved in patients less than 38 years old. Performed at [facility],  [place] [address], [place], [place] [address]      Medications:    acetaminophen  1,000 mg Oral TID   apixaban  5 mg Oral BID   Chlorhexidine Gluconate Cloth  6 each Topical Daily   feeding supplement  237 mL Oral TID BM   gabapentin  100 mg Oral QHS   hydrocortisone sod succinate (SOLU-CORTEF) inj  100 mg Intravenous Q8H   oxyCODONE  20 mg Oral Q12H   pantoprazole  40 mg Oral BID AC   polyethylene glycol  17 g Oral Daily   senna-docusate  1 tablet Oral BID   sodium chloride flush  10-40 mL Intracatheter Q12H   thiamine  100 mg Oral Daily   Continuous Infusions:  cefTRIAXone (ROCEPHIN)  IV 2 g (11/02/22 2056)   methocarbamol (ROBAXIN) IV        LOS: 3 days   Charlynne Cousins  [facility]  11/03/2022, 12:13 PM

## 2022-11-04 DIAGNOSIS — T83511A Infection and inflammatory reaction due to indwelling urethral catheter, initial encounter: Secondary | ICD-10-CM | POA: Diagnosis not present

## 2022-11-04 DIAGNOSIS — G8220 Paraplegia, unspecified: Secondary | ICD-10-CM | POA: Diagnosis not present

## 2022-11-04 DIAGNOSIS — C221 Intrahepatic bile duct carcinoma: Secondary | ICD-10-CM | POA: Diagnosis not present

## 2022-11-04 DIAGNOSIS — Z515 Encounter for palliative care: Secondary | ICD-10-CM | POA: Diagnosis not present

## 2022-11-04 DIAGNOSIS — N390 Urinary tract infection, site not specified: Secondary | ICD-10-CM | POA: Diagnosis not present

## 2022-11-04 DIAGNOSIS — R52 Pain, unspecified: Secondary | ICD-10-CM | POA: Diagnosis not present

## 2022-11-04 DIAGNOSIS — E861 Hypovolemia: Secondary | ICD-10-CM | POA: Diagnosis not present

## 2022-11-04 DIAGNOSIS — I9589 Other hypotension: Secondary | ICD-10-CM | POA: Diagnosis not present

## 2022-11-04 LAB — RENAL FUNCTION PANEL
Albumin: 2.3 g/dL — ABNORMAL LOW (ref 3.5–5.0)
Anion gap: 9 (ref 5–15)
BUN: 19 mg/dL (ref 8–23)
CO2: 29 mmol/L (ref 22–32)
Calcium: 7.9 mg/dL — ABNORMAL LOW (ref 8.9–10.3)
Chloride: 104 mmol/L (ref 98–111)
Creatinine, Ser: 0.44 mg/dL — ABNORMAL LOW (ref 0.61–1.24)
GFR, Estimated: >60 mL/min (ref >60)
Glucose, Bld: 130 mg/dL — ABNORMAL HIGH (ref 70–99)
Phosphorus: 2.5 mg/dL (ref 2.5–4.6)
Potassium: 3.4 mmol/L — ABNORMAL LOW (ref 3.5–5.1)
Sodium: 142 mmol/L (ref 135–145)

## 2022-11-04 MED ORDER — POTASSIUM CHLORIDE 20 MEQ PO PACK
40.0000 meq | PACK | Freq: Two times a day (BID) | ORAL | Status: AC
  Administered 2022-11-04 (×2): 40 meq via ORAL
  Filled 2022-11-04 (×2): qty 2

## 2022-11-04 MED ORDER — POLYETHYLENE GLYCOL 3350 17 G PO PACK
17.0000 g | PACK | Freq: Two times a day (BID) | ORAL | Status: AC
  Administered 2022-11-04 – 2022-11-06 (×4): 17 g via ORAL
  Filled 2022-11-04 (×4): qty 1

## 2022-11-04 MED ORDER — HYDROCORTISONE SOD SUC (PF) 100 MG IJ SOLR
25.0000 mg | Freq: Two times a day (BID) | INTRAMUSCULAR | Status: DC
  Administered 2022-11-04 – 2022-11-05 (×2): 25 mg via INTRAVENOUS
  Filled 2022-11-04 (×3): qty 0.5

## 2022-11-04 MED ORDER — OXYCODONE HCL ER 20 MG PO T12A
40.0000 mg | EXTENDED_RELEASE_TABLET | Freq: Two times a day (BID) | ORAL | Status: DC
  Administered 2022-11-04 – 2022-11-09 (×10): 40 mg via ORAL
  Filled 2022-11-04 (×10): qty 2

## 2022-11-04 NOTE — Progress Notes (Signed)
Physical Therapy Treatment Patient Details Name: Jason Lyons MRN: [id number] DOB: 04-Aug-1956 Today's Date: 11/04/2022   History of Present Illness Patient is a 67 year old male who presented back to hospital after recent transition to SNF with uncontrolled pain. PYK:[id number] fracture T4,metastatic disease to T4 and T6 resulting in paraplegia with cord compression at T6 level and encroachment on the thecal sac, urinary retention status post Foley catheter malnutrition.    PT Comments    Pt pleasant and motivated to work with therapy. Session focused on forward trunk leaning to weight shift back against bed and attempt to maintain balance without posterior support. Pt also completed bil UE strengthening with weights, manual resistance, and ball toss. Will continue to progress to sitting unsupported EOB with 2+ assist for safety and on bd mobility to improve independence with pressure relief. Continue to recommend SNF rehab, will progress as able.    Recommendations for follow up therapy are one component of a multi-disciplinary discharge planning process, led by the attending physician.  Recommendations may be updated based on patient status, additional functional criteria and insurance authorization.  Follow Up Recommendations  Skilled nursing-short term rehab (<3 hours/day) Can patient physically be transported by private vehicle: No   Assistance Recommended at Discharge Frequent or constant Supervision/Assistance  Patient can return home with the following A lot of help with walking and/or transfers;A lot of help with bathing/dressing/bathroom   Equipment Recommendations  Hospital bed    Recommendations for Other Services       Precautions / Restrictions Precautions Precautions: Fall;Back Precaution Comments: Thoracic lesions, paraplegia Restrictions Weight Bearing Restrictions: No     Mobility  Bed Mobility               General bed mobility comments: completed  exercises in bed. HOB elevated and and FOB lowered. pt able to pull trunk up with use of Bil UE's on grab bars however unable to maintain forward trunk lean without at least single UE support. pt completed 2x 10 reps underhand toss and overhead toss with bil UE. on second round completed single hand catch and throw. Pt also completed weight exercises and manual resistance exercises for bil UE.    Transfers                        Ambulation/Gait                   Stairs             Wheelchair Mobility    Modified Rankin (Stroke Patients Only)       Balance Overall balance assessment: Needs assistance Sitting-balance support: Bilateral upper extremity supported, Feet supported Sitting balance-Leahy Scale: Poor                                      Cognition Arousal/Alertness: Awake/alert Behavior During Therapy: WFL for tasks assessed/performed Overall Cognitive Status: Difficult to assess                                          Exercises Other Exercises Other Exercises: pt completed 2x 10 reps underhand toss and overhead toss with bil UE. on second round completed single hand catch and throw. Other Exercises: 2x10 reps bicep curl with 2lb weight and then with  manual resistance from therapist Other Exercises: 10 reps bil UE overhead lift with 5 lbs weight. Other Exercises: 10 reps shoulder press with 2lbs weight bil UE Other Exercises: 10 reps anterior leans with unilateral UE support and contralateral UE reach to touch therapist hand in front of him    General Comments        Pertinent Vitals/Pain Pain Assessment Pain Assessment: Faces Faces Pain Scale: Hurts a little bit Pain Location: back Pain Descriptors / Indicators: Grimacing, Discomfort Pain Intervention(s): Limited activity within patient's tolerance, Monitored during session, Repositioned    Home Living                          Prior  Function            PT Goals (current goals can now be found in the care plan section) Acute Rehab PT Goals PT Goal Formulation: With patient Time For Goal Achievement: 11/15/22 Potential to Achieve Goals: Fair Progress towards PT goals: Progressing toward goals    Frequency    Min 2X/week      PT Plan Current plan remains appropriate    Co-evaluation              AM-PAC PT "6 Clicks" Mobility   Outcome Measure  Help needed turning from your back to your side while in a flat bed without using bedrails?: A Lot Help needed moving from lying on your back to sitting on the side of a flat bed without using bedrails?: Total Help needed moving to and from a bed to a chair (including a wheelchair)?: Total Help needed standing up from a chair using your arms (e.g., wheelchair or bedside chair)?: Total Help needed to walk in hospital room?: Total Help needed climbing 3-5 steps with a railing? : Total 6 Click Score: 7    End of Session   Activity Tolerance: Patient tolerated treatment well Patient left: in bed;with call bell/phone within reach;with bed alarm set Nurse Communication: Mobility status PT Visit Diagnosis: Other abnormalities of gait and mobility (R26.89);Other symptoms and signs involving the nervous system (M62.947)     Time: 1520-1540 PT Time Calculation (min) (ACUTE ONLY): 20 min  Charges:  $Therapeutic Exercise: 8-22 mins                     Verner Mould, DPT Acute Rehabilitation Services Office [phone]  11/04/22 4:05 PM

## 2022-11-04 NOTE — Care Management Important Message (Signed)
Important Message  Patient Details IM Letter given. Name: Jason Lyons MRN: [id number] Date of Birth: Mar 08, 1956   Medicare Important Message Given:  Yes     Kerin Salen 11/04/2022, 2:34 PM

## 2022-11-04 NOTE — Progress Notes (Signed)
Palliative Medicine Progress Note   Patient Name: Jason Lyons       Date: 11/04/2022 DOB: Dec 19, 1955  Age: 67 y.o. MRN#: [id number] Attending Physician: Charlynne Cousins, MD Primary Care Physician: Kathyrn Lass Admit Date: 10/30/2022   HPI/Patient Profile: 67 y.o. male  with past medical history of cholangiocarcinoma metastatic to bone. He follows with oncology at [facility] and has been receiving chemotherapy. He was recently admitted to [facility] on 10/12/22 with chest and back pain and found to have a T6 spinal metastasis as well as new scattered pulmonary nodules. There was concern for cord compression. He was seen by neurosurgery and deemed not a surgical candidate. He was treated with palliative radiation, decadron, and pain medication. He was discharged to SNF on 10/29/22 but they did not have pain medication and he presented back to the ED 10/30/2022 with uncontrolled back pain.    Palliative Medicine was consulted for goals of care and pain management.    Subjective: I met today at bedside with patient, wife Evelena Leyden, and son [name]. Video interpreter was utilized throughout the discussion (#[id number]).   PA April with [facility] was present by video chat. She has discussed the case extensively with patient's oncologist Dr. Nelda Marseille. She informs patient and family that unfortunately due to his poor functional status, that he is not a candidate for additional cancer treatment. They are concerned that treatment would cause mor harm than good. Their recommendation is to pursue comfort focused care.   With further discussion, patient and family are agreeable to more comfort-focused care, understanding this means not pursuing full scope medical interventions and allowing a natural course to occur.  Reviewed that the goal is comfort and dignity rather than cure/prolonging life.   Patient states his goal is to go home and have pain medications. I recommended the addition of hospice support at home. Discussed that the hospice team can provide personal care, symptom management and pain control, and help keep patient out of the hospital. Family are open to hospice but express some concern about their ability to care for patient at home.   I recommended DNR/DNI status and explained this is a protective measure to keep [place] from harming the patient in their last moments of life. Patient/family were agreeable to DNR/DNI status understanding that prognosis would be poor in  the event of cardiac arrest.    Questions and concerns answered. Emotional support provided.   Objective:  Physical Exam Vitals reviewed.  Constitutional:      General: He is not in acute distress.    Appearance: He is ill-appearing.  Pulmonary:     Effort: Pulmonary effort is normal.  Neurological:     Mental Status: He is alert and oriented to person, place, and time.            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[place]) Active Problems:   Pulmonary embolism (HCC)   Elevated LFTs   Other constipation   Paraplegia (HCC)   Malnutrition of moderate degree   Acute urinary retention   Leukocytosis   Hypotension   Pyuria   Hyponatremia   Thrombocytopenia (HCC)   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)   Urinary tract infection associated with indwelling urethral catheter (HCC)   Intractable pain   Pressure injury of skin    Recommendations/Plan: Code status changed to DNR/DNI Patient and family have been informed by [facility] oncology that he is not a candidate for additional treatment Patient wishes to go home, but family expresses concern about ability to care for him Referral to [facility] -  liaison to meet with family tomorrow morning at 9 am   Symptom Management:  Agree with increasing Oxycontin to 40 mg BID Neurontin 100 mg at bedtime oxycodone IR 10 mg every 4 hours as needed for breakthrough pain dilaudid 1 mg IV every 3 hours as needed for severe pain Continue bowel regiman   Prognosis:  < 6 months  Discharge Planning: To Be Determined  Care plan was discussed with Dr. Aileen Fass  Thank you for allowing the Palliative Medicine Team to assist in the care of this patient.   Total time: 68 minutes   Lavena Bullion, NP   Please contact Palliative Medicine Team phone at [phone] for questions and concerns.  For individual providers, please see AMION.

## 2022-11-04 NOTE — Progress Notes (Signed)
[facility] PROGRESS NOTE    Progress Note  Jason Lyons  FTD:[id number] DOB: 1956-06-22 DOA: 10/30/2022 PCP: Pcp, No     Brief Narrative:   Jason Lyons is an 67 y.o. 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 urinary retention status post Foley catheter and thrombocytopenia with a history of PE on Eliquis Comes in with uncontrollable pain known to have a pathologic fracture of T4 status post radiation at [facility] on 09/30/2023 readmitted on 10/12/2022 at [facility] with right-sided chest pain radiating to her back found to have T6 spinal metastases as well as new scattered pulmonary nodules neurosurgery was consulted and did not feel the patient was in a Perative candidate was given Decadron and transferred to [place] to consider radiation therapy as palliative measures.  Started on radiation continue on Decadron and pain management.  Started having urinary retention Foley catheter inserted.  After completing his radiation therapy on 10/26/2022 was discharged to skilled on 10/28/2022 and an started Decadron taper.   Assessment/Plan:   Hypotension Multifactorial likely due to dehydration and probably UTI urine culture grew more than 100,000 colonies of E. coli. His catheter was exchanged. Started on IV albumin to receive stress dose steroids and started on IV Rocephin. Continue to taper down Solu-Cortef.  Can potentially change to Decadron taper as recommended by radiation oncology.  E. coli UTI: Urine culture grew more than 100,000 colonies of E. coli on IV Rocephin. Continue IV Rocephin for 7 days.  Pulmonary embolism: Continue Eliquis.  Severe constipation: Continue MiraLAX p.o. twice daily and Senokot.  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 He completed palliative radiation during last hospitalization and started on Decadron, steroids were not available at skilled. Patient was not a candidate for inpatient rehab. Palliative Care has been managing his pain regimen,  placed on OxyContin twice (which was increased on 11/04/2018 24-40 twice daily) a day Percocet 10 for breakthrough pain and IV Dilaudid as needed. Fentanyl patch was discontinued. And palliative care following.  Metastatic cholangiocarcinoma diagnosed in 2022: Palliative care was consulted and is to have a video conference with the patient's family and oncologist on 11/04/2022. Continue current pain management.  Acute urinary retention: Felt to be likely secondary to cord compression currently with Foley catheter and will follow-up with urology if needed as an outpatient.  Anterior chest/scapular pain: Cardiac workup negative continue PPI and GI cocktail.  Moderate malnutrition in the context of chronic illness: Noted.  Hyponatremia: Likely due to hypovolemia resolved with hydration.  Thrombocytopenia: Likely due to chronic illness his platelets have remained relatively stable.  Electrolyte imbalance hypokalemia/hypophosphatemia: Try to keep potassium greater than 4 and phosphorus greater than 3. Repleted orally phosphorus is improved, potassium continues to be low continue replete orally.  Significant results are present on admission stage II. RN Pressure Injury Documentation: Pressure Injury 10/31/22 Buttocks Right Stage 2 -  Partial thickness loss of dermis presenting as a shallow open injury with a red, pink wound bed without slough. (Active)  10/31/22 0519  Location: Buttocks  Location Orientation: Right  Staging: Stage 2 -  Partial thickness loss of dermis presenting as a shallow open injury with a red, pink wound bed without slough.  Wound Description (Comments):   Present on  Admission:  Yes  Dressing Type Foam - Lift dressing to assess site every shift 11/03/22 2030   DVT prophylaxis: lovenox Family Communication:none Status is: Inpatient Remains inpatient appropriate because: Uncontrollable pain    Code Status:     Code Status Orders  (From admission, onward)           Start     Ordered   10/30/22 2119  Full code  Continuous       Question:  By:  Answer:  Consent: discussion documented in EHR   10/30/22 2120           Code Status History     Date Active Date Inactive Code Status Order ID Comments User Context   10/13/2022 0536 10/29/2022 1450 Full Code [id number]  [name], MD ED   05/13/2021 2330 05/19/2021 2025 Full Code [id number]  Rise Patience, MD Inpatient      Advance Directive Documentation    Flowsheet Row Most Recent Value  Type of Advance Directive Healthcare Power of Attorney  Pre-existing out of facility DNR order (yellow form or pink MOST form) --  "MOST" Form in Place? --         IV Access:   Peripheral IV   Procedures and diagnostic studies:   No results found.   Medical Consultants:   None.   Subjective:    Jason Lyons relates his pain is relatively controlled  Objective:    Vitals:   11/03/22 0504 11/03/22 1321 11/03/22 2045 11/04/22 0345  BP: 126/77 121/84 118/71 127/83  Pulse: 68 79 69 64  Resp: '18 20 20 20  '$ Temp: 97.6 F (36.4 C) 98 F (36.7 C) 98.2 F (36.8 C) 98 F (36.7 C)  TempSrc: Oral Oral Oral Oral  SpO2: 97% 98% 99% 99%  Weight:      Height:       SpO2: 99 % FiO2 (%): 21 %   Intake/Output Summary (Last 24 hours) at 11/04/2022 1005 Last data filed at 11/04/2022 0656 Gross per 24 hour  Intake 1046.6 ml  Output 1900 ml  Net -853.4 ml    Filed Weights   11/02/22 0412  Weight: 69 kg    Exam: General exam: In no acute distress. Respiratory system: Good air movement and clear to auscultation. Cardiovascular system: S1 & S2 heard, RRR. No  JVD. Gastrointestinal system: Abdomen is nondistended, soft and nontender.  Extremities: No pedal edema. Skin: No rashes, lesions or ulcers Psychiatry: Judgement and insight appear normal. Mood & affect appropriate.    Data Reviewed:    Labs: Basic Metabolic Panel: Recent Labs  Lab 10/30/22 2212 10/31/22 0702 11/01/22 0549 11/02/22 1010 11/03/22 0354 11/04/22 0243  NA  --  139 136 137 140 142  K  --  3.9 4.0 3.2* 3.7 3.4*  CL  --  108 111 107 105 104  CO2  --  '27 22 25 27 29  '$ GLUCOSE  --  104* 114* 175* 201* 130*  BUN  --  '21 20 19 18 19  '$ CREATININE  --  0.47* 0.44* 0.51* 0.40* 0.44*  CALCIUM  --  7.9* 7.7* 7.8* 7.9* 7.9*  MG 1.9 2.4 2.1 2.1 2.2  --   PHOS 3.3 3.3  --  1.8* 1.7* 2.5    GFR Estimated Creatinine Clearance: 76.9 mL/min (A) (by C-G formula based on SCr of 0.44 mg/dL (L)). Liver Function Tests: Recent Labs  Lab 10/30/22 1900 10/31/22 0702 11/01/22 0549 11/02/22 1010 11/03/22 0354 11/04/22 0243  AST 48* 33  24  --   --   --   ALT 148* 117* 71*  --   --   --   ALKPHOS 184* 156* 101  --   --   --   BILITOT 1.0 0.8 0.5  --   --   --   PROT 5.9* 5.3* 4.4*  --   --   --   ALBUMIN 2.7* 2.2* 2.1* 2.3* 2.2* 2.3*    No results for input(s): "LIPASE", "AMYLASE" in the last 168 hours. No results for input(s): "AMMONIA" in the last 168 hours. Coagulation profile Recent Labs  Lab 10/30/22 2212  INR 1.6*    COVID-19 Labs  No results for input(s): "DDIMER", "FERRITIN", "LDH", "CRP" in the last 72 hours.  Lab Results  Component Value Date   [place] NEGATIVE 05/13/2021    CBC: Recent Labs  Lab 10/30/22 1900 10/31/22 0702 11/01/22 0549 11/02/22 1010 11/03/22 0354  WBC 18.4* 12.7* 8.1 6.7 6.2  NEUTROABS 17.4*  --  7.6  --   --   HGB 14.6 13.6 11.2* 12.1* 11.9*  HCT 43.1 40.3 33.6* 36.4* 34.7*  MCV 96.9 97.6 98.2 98.4 96.9  PLT 82* 65* 51* 56* 51*    Cardiac Enzymes: Recent Labs  Lab 10/30/22 2212  CKTOTAL 46*    BNP (last 3  results) No results for input(s): "PROBNP" in the last 8760 hours. CBG: No results for input(s): "GLUCAP" in the last 168 hours. D-Dimer: No results for input(s): "DDIMER" in the last 72 hours. Hgb A1c: No results for input(s): "HGBA1C" in the last 72 hours. Lipid Profile: No results for input(s): "CHOL", "HDL", "LDLCALC", "TRIG", "CHOLHDL", "LDLDIRECT" in the last 72 hours. Thyroid function studies: No results for input(s): "TSH", "T4TOTAL", "T3FREE", "THYROIDAB" in the last 72 hours.  Invalid input(s): "FREET3" Anemia work up: No results for input(s): "VITAMINB12", "FOLATE", "FERRITIN", "TIBC", "IRON", "RETICCTPCT" in the last 72 hours. Sepsis Labs: Recent Labs  Lab 10/30/22 2049 10/30/22 2212 10/31/22 0702 11/01/22 0549 11/02/22 1010 11/03/22 0354  PROCALCITON  --  4.35  --   --   --   --   WBC  --   --  12.7* 8.1 6.7 6.2  LATICACIDVEN 1.0 0.9  --   --   --   --     Microbiology Recent Results (from the past 240 hour(s))  Urine Culture     Status: Abnormal   Collection Time: 10/30/22  8:30 PM   Specimen: Urine, Catheterized  Result Value Ref Range Status   Specimen Description   Final    URINE, CATHETERIZED Performed at [facility], [place] [address], [place], [place] [address]    Special Requests   Final    NONE Performed at [facility], [place] [address] [place], [place] [address]    Culture >=100,000 COLONIES/mL ESCHERICHIA COLI (A)  Final   Report Status 11/02/2022 FINAL  Final   Organism ID, Bacteria ESCHERICHIA COLI (A)  Final      Susceptibility   Escherichia coli - MIC*    AMPICILLIN >=32 RESISTANT Resistant     CEFAZOLIN <=4 SENSITIVE Sensitive     CEFEPIME <=0.12 SENSITIVE Sensitive     CEFTRIAXONE <=0.25 SENSITIVE Sensitive     CIPROFLOXACIN <=0.25 SENSITIVE Sensitive     GENTAMICIN <=1 SENSITIVE Sensitive     IMIPENEM <=0.25 SENSITIVE Sensitive     NITROFURANTOIN <=16 SENSITIVE Sensitive     TRIMETH/SULFA <=20  SENSITIVE Sensitive     AMPICILLIN/SULBACTAM 16 INTERMEDIATE  Intermediate     PIP/TAZO <=4 SENSITIVE Sensitive     * >=100,000 COLONIES/mL ESCHERICHIA COLI  Culture, blood (routine x 2)     Status: None (Preliminary result)   Collection Time: 10/30/22  9:02 PM   Specimen: BLOOD  Result Value Ref Range Status   Specimen Description   Final    BLOOD Performed at [place] [address] [place], [place] [address]    Special Requests   Final    BOTTLES DRAWN AEROBIC AND ANAEROBIC Blood Culture adequate volume Performed at [place] [address], [place], [place] [address]    Culture   Final    NO GROWTH 4 DAYS Performed at [facility], [place] [address], [place], [place] [address]    Report Status PENDING  Incomplete  Culture, blood (routine x 2)     Status: None (Preliminary result)   Collection Time: 10/31/22  7:02 AM   Specimen: BLOOD RIGHT HAND  Result Value Ref Range Status   Specimen Description   Final    BLOOD RIGHT HAND Performed at [facility], [place] [address], [place], [place] [address]    Special Requests   Final    AEROBIC BOTTLE ONLY Blood Culture adequate volume Performed at [facility] [address], [place], [place] [address]    Culture   Final    NO GROWTH 4 DAYS Performed at [facility], [place] [address] [place], [place] [address]    Report Status PENDING  Incomplete  MRSA Next Gen by PCR, Nasal     Status: None   Collection Time: 11/01/22  1:03 PM   Specimen: Nasal Mucosa; Nasal Swab  Result Value Ref Range Status   MRSA by PCR Next Gen NOT DETECTED NOT DETECTED Final    Comment: (NOTE) The GeneXpert MRSA Assay (FDA approved for NASAL specimens only), is one component of a comprehensive MRSA colonization surveillance program. It is not intended to diagnose MRSA infection nor to guide or monitor treatment for MRSA infections. Test performance is not FDA approved in  patients less than 52 years old. Performed at [facility], [place] [address], [place],  [address]      Medications:    acetaminophen  1,000 mg Oral TID   apixaban  5 mg Oral BID   Chlorhexidine Gluconate Cloth  6 each Topical Daily   feeding supplement  237 mL Oral TID BM   hydrocortisone sod succinate (SOLU-CORTEF) inj  50 mg Intravenous Q8H   oxyCODONE  20 mg Oral Q12H   pantoprazole  40 mg Oral BID AC   polyethylene glycol  17 g Oral BID   senna-docusate  1 tablet Oral BID   sodium chloride flush  10-40 mL Intracatheter Q12H   thiamine  100 mg Oral Daily   Continuous Infusions:  cefTRIAXone (ROCEPHIN)  IV 200 mL/hr at 11/04/22 0656   methocarbamol (ROBAXIN) IV        LOS: 4 days   Charlynne Cousins  [facility]  11/04/2022, 10:05 AM

## 2022-11-04 NOTE — TOC Progression Note (Signed)
Transition of Care [facility]) - Progression Note    Patient Details  Name: Jason Lyons MRN: [id number] Date of Birth: 1956/03/02  Transition of Care [facility]) CM/SW Contact  Amaris Garrette, Juliann Pulse, RN Phone Number: 11/04/2022, 11:24 AM  Clinical Narrative:  spoke to son [name] about ST SNF-he states declines to return back to [facility] for faxing out to additional facilities-list in rm w/additional choices;Otho Ket will discuss with family if agree to [facility] SN[facility] or home w/HHC-informed of PT recc ST SNF-unable to walk-duy says no family available to asst @ home-he will discuss with family-informed him of HHC(intermittent services);private duty care(out of pocket cost). Await his decision of d/c plan.     Expected Discharge Plan: [place] (TBD) Barriers to Discharge: Continued Medical Work up  Expected Discharge Plan and Services   Discharge Planning Services: CM Consult   Living arrangements for the past 2 months: Single Family Home                                       Social Determinants of Health (SDOH) Interventions SDOH Screenings   Food Insecurity: No Food Insecurity (11/01/2022)  Housing: Low Risk  (11/01/2022)  Transportation Needs: No Transportation Needs (11/01/2022)  Utilities: Not At Risk (11/01/2022)  Tobacco Use: Medium Risk (10/30/2022)    Readmission Risk Interventions     No data to display

## 2022-11-05 DIAGNOSIS — N390 Urinary tract infection, site not specified: Secondary | ICD-10-CM | POA: Diagnosis not present

## 2022-11-05 DIAGNOSIS — E861 Hypovolemia: Secondary | ICD-10-CM | POA: Diagnosis not present

## 2022-11-05 DIAGNOSIS — I9589 Other hypotension: Secondary | ICD-10-CM | POA: Diagnosis not present

## 2022-11-05 DIAGNOSIS — T83511A Infection and inflammatory reaction due to indwelling urethral catheter, initial encounter: Secondary | ICD-10-CM | POA: Diagnosis not present

## 2022-11-05 LAB — CULTURE, BLOOD (ROUTINE X 2)
Culture: NO GROWTH
Culture: NO GROWTH
Special Requests: ADEQUATE
Special Requests: ADEQUATE

## 2022-11-05 LAB — GLUCOSE, CAPILLARY: Glucose-Capillary: 101 mg/dL — ABNORMAL HIGH (ref 70–99)

## 2022-11-05 MED ORDER — HYDROCORTISONE SOD SUC (PF) 100 MG IJ SOLR
25.0000 mg | Freq: Every day | INTRAMUSCULAR | Status: AC
  Administered 2022-11-06: 25 mg via INTRAVENOUS
  Filled 2022-11-05: qty 0.5

## 2022-11-05 NOTE — Progress Notes (Signed)
Assumed care of patient from off going nurse. Agree with previous assessment. Introduced self to patient and addressed needs. Call light within reach, bed alarm armed.

## 2022-11-05 NOTE — Progress Notes (Signed)
Occupational Therapy Treatment Patient Details Name: Jason Lyons MRN: [id number] DOB: [birth year] Today's Date: 11/05/2022   History of present illness Patient is a 67 year old male who presented back to hospital after recent transition to SNF with uncontrolled pain. CHE:NIDPOEUMPNTI fracture T4,metastatic disease to T4 and T6 resulting in paraplegia with cord compression at T6 level and encroachment on the thecal sac, urinary retention status post Foley catheter malnutrition.   OT comments  Patient sat edge of be with varying levels of UE assist to work on sitting balance and tolerance. Limited by back and chest after activity and returned to supine for comfort. Patient motivated to improve functional abilities. Continue to recommend SNF at discharge.     Recommendations for follow up therapy are one component of a multi-disciplinary discharge planning process, led by the attending physician.  Recommendations may be updated based on patient status, additional functional criteria and insurance authorization.    Follow Up Recommendations  Skilled nursing-short term rehab (<3 hours/day)     Assistance Recommended at Discharge Frequent or constant Supervision/Assistance  Patient can return home with the following  Two people to help with walking and/or transfers;Assistance with cooking/housework;Two people to help with bathing/dressing/bathroom;Direct supervision/assist for financial management;Help with stairs or ramp for entrance;Assist for transportation;Direct supervision/assist for medications management   Equipment Recommendations  Other (comment) (TBD)    Recommendations for Other Services      Precautions / Restrictions Precautions Precautions: Fall;Back Precaution Comments: Thoracic lesions, paraplegia Restrictions Weight Bearing Restrictions: No       Mobility Bed Mobility Overal bed mobility: Needs Assistance Bed Mobility: Sit to Sidelying, Supine to Sit Rolling: Max  assist       Sit to sidelying: Max assist General bed mobility comments: Assistance needed for flaccid lower extremities and trunk to transfer to sitting and supine.    Transfers                         Balance Overall balance assessment: Needs assistance Sitting-balance support: Single extremity supported, Feet unsupported Sitting balance-Leahy Scale: Poor Sitting balance - Comments: Continues to need at least heavy support of one extremity and min on the other. Able to perform bilateral hands on lap with close min guard                                   ADL either performed or assessed with clinical judgement   ADL                                              Extremity/Trunk Assessment Upper Extremity Assessment Upper Extremity Assessment: Overall WFL for tasks assessed   Lower Extremity Assessment Lower Extremity Assessment: Defer to PT evaluation RLE Deficits / Details: flaccid bil LEs, reports sensation intact   Cervical / Trunk Assessment Cervical / Trunk Assessment: Normal    Vision   Vision Assessment?: No apparent visual deficits   Perception     Praxis      Cognition Arousal/Alertness: Awake/alert Behavior During Therapy: WFL for tasks assessed/performed Overall Cognitive Status: Difficult to assess  Exercises Other Exercises Other Exercises: 5 min EOB working on sitting balance - progressed from bilateral UE support on bed, to bilateral hands on knees, to one hand on knee and one hand with min support from therapist.    Shoulder Instructions       General Comments      Pertinent Vitals/ Pain       Pain Assessment Pain Assessment: 0-10 Pain Score: 7  Pain Location: back, chest Pain Descriptors / Indicators: Grimacing, Discomfort Pain Intervention(s): Limited activity within patient's tolerance, Monitored during session, Repositioned  Home  Living                                          Prior Functioning/Environment              Frequency  Min 2X/week        Progress Toward Goals  OT Goals(current goals can now be found in the care plan section)  Progress towards OT goals: Progressing toward goals  Acute Rehab OT Goals OT Goal Formulation: Patient unable to participate in goal setting Time For Goal Achievement: 11/15/22 Potential to Achieve Goals: [name] Discharge plan remains appropriate    Co-evaluation          OT goals addressed during session: Strengthening/ROM (sitting balance)      AM-PAC OT "6 Clicks" Daily Activity     Outcome Measure   Help from another person eating meals?: A Little Help from another person taking care of personal grooming?: A Little Help from another person toileting, which includes using toliet, bedpan, or urinal?: Total Help from another person bathing (including washing, rinsing, drying)?: A Lot Help from another person to put on and taking off regular upper body clothing?: A Little Help from another person to put on and taking off regular lower body clothing?: A Lot 6 Click Score: 14    End of Session    OT Visit Diagnosis: Unsteadiness on feet (R26.81);Other abnormalities of gait and mobility (R26.89);Muscle weakness (generalized) (M62.81);Pain   Activity Tolerance Patient limited by pain   Patient Left in bed;with call bell/phone within reach;with bed alarm set   Nurse Communication Mobility status (pain)        Time: 9935-7017 OT Time Calculation (min): 17 min  Charges: OT General Charges $OT Visit: 1 Visit OT Treatments $Therapeutic Activity: 8-22 mins  Gustavo Lah, OTR/L [place]  Office [phone]   Lenward Chancellor 11/05/2022, 3:48 PM

## 2022-11-05 NOTE — Progress Notes (Signed)
   Met with pt son Otho Ket and pt's brother Aaron Edelman at bedside. We discussed hospice services at home. Used the [place] interpreter for pt to participate. After discussion- it was deemed pt did not have a caregiver in the home care setting from 1000am to 700pm. After discussion that this was not a safe d/c plan and that we could not accept the pt in the home with no caregiver for him to assist with his needs since he is bed bound and unable to care for himself without assistance. It was decided that they would either hire and pay out of pocket for someone to come into the home or they would piece together the family so that someone is there with him to assist with his needs. They are also interested in getting CAPS worker in place We can assist with this as well but it does take several weeks usually for this to start. Pt will need equipment in home of wheelchair, hospital bed, and over bed table this has been ordered for pt to be delivered today.   I have emailed a caregiver list of local agencies to the a family member that was on the phone while we were having a discussion.   Webb Silversmith RN [phone]

## 2022-11-05 NOTE — TOC Progression Note (Signed)
Transition of Care [facility]) - Progression Note    Patient Details  Name: Jason Lyons MRN: [id number] Date of Birth: October 19, 1955  Transition of Care [facility]) CM/SW Contact  Kato Wieczorek, Juliann Pulse, RN Phone Number: 11/05/2022, 12:32 PM  Clinical Narrative: Provided updated bed offers to [name](Son);list left in rm.      Expected Discharge Plan: [place] (TBD) Barriers to Discharge: Continued Medical Work up  Expected Discharge Plan and Services   Discharge Planning Services: CM Consult   Living arrangements for the past 2 months: Single Family Home                                       Social Determinants of Health (SDOH) Interventions SDOH Screenings   Food Insecurity: No Food Insecurity (11/01/2022)  Housing: Low Risk  (11/01/2022)  Transportation Needs: No Transportation Needs (11/01/2022)  Utilities: Not At Risk (11/01/2022)  Tobacco Use: Medium Risk (10/30/2022)    Readmission Risk Interventions    11/04/2022   11:32 AM  Readmission Risk Prevention Plan  Transportation Screening Complete  PCP or Specialist Appt within 3-5 Days Complete  HRI or Early Complete  Social Work Consult for [place] Planning/Counseling Complete  Palliative Care Screening Complete  Medication Review [employer]) Complete

## 2022-11-05 NOTE — Progress Notes (Signed)
[facility] PROGRESS NOTE    Progress Note  Jason Lyons  XIP:[id number] DOB: Feb 20, 1956 DOA: 10/30/2022 PCP: Pcp, No     Brief Narrative:   Jason Lyons is an 67 y.o. 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 urinary retention status post Foley catheter and thrombocytopenia with a history of PE on Eliquis [place] in with uncontrollable pain known to have a pathologic fracture of T4 status post radiation at [facility] on 09/30/2023 readmitted on 10/12/2022 at [facility] with right-sided chest pain radiating to her back found to have T6 spinal metastases as well as new scattered pulmonary nodules neurosurgery was consulted and did not feel the patient was in a Perative candidate was given Decadron and transferred to [place] to consider radiation therapy as palliative measures.  Started on radiation continue on Decadron and pain management.  Started having urinary retention Foley catheter inserted.  After completing his radiation therapy on 10/26/2022 was discharged to skilled on 10/28/2022 and an started Decadron taper.   Assessment/Plan:   Hypotension: Multifactorial likely due to dehydration and probably UTI urine culture grew more than 100,000 colonies of E. coli. His catheter was exchanged. Continue taper down steroids. Continue Rocephin. Palliative care met with family and oncologist he was not a candidate for further treatment. We decided to move towards hospice care, hospice care to [facility] to meet with him today family is not able to provide the care he needs at home will probably need residential hospice facility.  E. coli UTI: Urine culture grew more than 100,000 colonies of E. coli on IV Rocephin. Continue IV Rocephin for 7 days.  Pulmonary embolism: Continue Eliquis.  Severe constipation: Continue MiraLAX p.o. twice daily and  Senokot.  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 He completed palliative radiation during last hospitalization and started on Decadron, steroids were not available at skilled. Patient was not a candidate for inpatient rehab. Palliative Care has been managing his pain regimen,  placed on OxyContin twice (which was increased on 11/04/2018 24-40 twice daily) a day Percocet 10 for breakthrough pain and IV Dilaudid as needed.  Metastatic cholangiocarcinoma diagnosed in 2022: Family to move towards hospice care, family to meet with hospice care of [place] Continue current pain management.  Acute urinary retention: Felt to be likely secondary to cord compression currently with Foley catheter and will follow-up with urology if needed as an outpatient.  Anterior chest/scapular pain: Cardiac workup negative continue PPI and GI cocktail.  Moderate malnutrition in the context of chronic illness: Noted.  Hyponatremia: Likely due to hypovolemia resolved with hydration.  Thrombocytopenia: Likely due to chronic illness his platelets have remained relatively stable.  Electrolyte imbalance hypokalemia/hypophosphatemia: Try to keep potassium greater than 4 and phosphorus greater than 3. Repleted orally phosphorus is improved, potassium continues to be low continue replete orally.  Significant results are present on admission stage II. RN Pressure Injury Documentation: Pressure Injury 10/31/22 Buttocks Right Stage 2 -  Partial thickness loss of dermis presenting as a shallow open injury with a red, pink wound bed without slough. (Active)  10/31/22 0519  Location: Buttocks  Location Orientation: Right  Staging: Stage 2 -  Partial thickness loss of dermis presenting as a shallow open injury with a red, pink wound bed without  slough.  Wound Description (Comments):   Present on Admission: Yes  Dressing Type Foam - Lift dressing to assess site every shift 11/05/22 0830   DVT prophylaxis: lovenox Family Communication:none Status is: Inpatient Remains inpatient appropriate because: Uncontrollable pain    Code Status:     Code Status Orders  (From admission, onward)           Start     Ordered   10/30/22 2119  Full code  Continuous       Question:  By:  Answer:  Consent: discussion documented in EHR   10/30/22 2120           Code Status History     Date Active Date Inactive Code Status Order ID Comments User Context   10/13/2022 0536 10/29/2022 1450 Full Code [id number]  [name], MD ED   05/13/2021 2330 05/19/2021 2025 Full Code [id number]  Rise Patience, MD Inpatient      Advance Directive Documentation    Flowsheet Row Most Recent Value  Type of Advance Directive Healthcare Power of Attorney  Pre-existing out of facility DNR order (yellow form or pink MOST form) --  "MOST" Form in Place? --         IV Access:   Peripheral IV   Procedures and diagnostic studies:   No results found.   Medical Consultants:   None.   Subjective:    Shade Kaley pain is better controlled.  Objective:    Vitals:   11/04/22 0345 11/04/22 1308 11/04/22 2052 11/05/22 0512  BP: 127/83 125/81 118/75 110/72  Pulse: 64 68 73 70  Resp: '20 18 18 18  '$ Temp: 98 F (36.7 C) 98.1 F (36.7 C) 98.2 F (36.8 C) 98.1 F (36.7 C)  TempSrc: Oral Oral Oral Oral  SpO2: 99% 99% 100% 98%  Weight:      Height:       SpO2: 98 % FiO2 (%): 21 %   Intake/Output Summary (Last 24 hours) at 11/05/2022 1006 Last data filed at 11/05/2022 0830 Gross per 24 hour  Intake 813.33 ml  Output 1700 ml  Net -886.67 ml    Filed Weights   11/02/22 0412  Weight: 69 kg    Exam: General exam: In no acute distress. Respiratory system: Good air movement and clear to auscultation. Cardiovascular system:  S1 & S2 heard, RRR. No JVD. Gastrointestinal system: Abdomen is nondistended, soft and nontender.  Extremities: No pedal edema. Skin: No rashes, lesions or ulcers Psychiatry: Judgement and insight appear normal. Mood & affect appropriate.    Data Reviewed:    Labs: Basic Metabolic Panel: Recent Labs  Lab 10/30/22 2212 10/31/22 0702 11/01/22 0549 11/02/22 1010 11/03/22 0354 11/04/22 0243  NA  --  139 136 137 140 142  K  --  3.9 4.0 3.2* 3.7 3.4*  CL  --  108 111 107 105 104  CO2  --  '27 22 25 27 29  '$ GLUCOSE  --  104* 114* 175* 201* 130*  BUN  --  '21 20 19 18 19  '$ CREATININE  --  0.47* 0.44* 0.51* 0.40* 0.44*  CALCIUM  --  7.9* 7.7* 7.8* 7.9* 7.9*  MG 1.9 2.4 2.1 2.1 2.2  --   PHOS 3.3 3.3  --  1.8* 1.7* 2.5    GFR Estimated Creatinine Clearance: 76.9 mL/min (A) (by C-G formula based on SCr of 0.44 mg/dL (L)). Liver Function Tests: Recent Labs  Lab 10/30/22 1900 10/31/22 0702 11/01/22 0549 11/02/22 1010  11/03/22 0354 11/04/22 0243  AST 48* 33 24  --   --   --   ALT 148* 117* 71*  --   --   --   ALKPHOS 184* 156* 101  --   --   --   BILITOT 1.0 0.8 0.5  --   --   --   PROT 5.9* 5.3* 4.4*  --   --   --   ALBUMIN 2.7* 2.2* 2.1* 2.3* 2.2* 2.3*    No results for input(s): "LIPASE", "AMYLASE" in the last 168 hours. No results for input(s): "AMMONIA" in the last 168 hours. Coagulation profile Recent Labs  Lab 10/30/22 2212  INR 1.6*    COVID-19 Labs  No results for input(s): "DDIMER", "FERRITIN", "LDH", "CRP" in the last 72 hours.  Lab Results  Component Value Date   [place] NEGATIVE 05/13/2021    CBC: Recent Labs  Lab 10/30/22 1900 10/31/22 0702 11/01/22 0549 11/02/22 1010 11/03/22 0354  WBC 18.4* 12.7* 8.1 6.7 6.2  NEUTROABS 17.4*  --  7.6  --   --   HGB 14.6 13.6 11.2* 12.1* 11.9*  HCT 43.1 40.3 33.6* 36.4* 34.7*  MCV 96.9 97.6 98.2 98.4 96.9  PLT 82* 65* 51* 56* 51*    Cardiac Enzymes: Recent Labs  Lab 10/30/22 2212  CKTOTAL 46*     BNP (last 3 results) No results for input(s): "PROBNP" in the last 8760 hours. CBG: No results for input(s): "GLUCAP" in the last 168 hours. D-Dimer: No results for input(s): "DDIMER" in the last 72 hours. Hgb A1c: No results for input(s): "HGBA1C" in the last 72 hours. Lipid Profile: No results for input(s): "CHOL", "HDL", "LDLCALC", "TRIG", "CHOLHDL", "LDLDIRECT" in the last 72 hours. Thyroid function studies: No results for input(s): "TSH", "T4TOTAL", "T3FREE", "THYROIDAB" in the last 72 hours.  Invalid input(s): "FREET3" Anemia work up: No results for input(s): "VITAMINB12", "FOLATE", "FERRITIN", "TIBC", "IRON", "RETICCTPCT" in the last 72 hours. Sepsis Labs: Recent Labs  Lab 10/30/22 2049 10/30/22 2212 10/31/22 0702 11/01/22 0549 11/02/22 1010 11/03/22 0354  PROCALCITON  --  4.35  --   --   --   --   WBC  --   --  12.7* 8.1 6.7 6.2  LATICACIDVEN 1.0 0.9  --   --   --   --     Microbiology Recent Results (from the past 240 hour(s))  Urine Culture     Status: Abnormal   Collection Time: 10/30/22  8:30 PM   Specimen: Urine, Catheterized  Result Value Ref Range Status   Specimen Description   Final    URINE, CATHETERIZED Performed at [facility], [place] [address], [place], [place] [address]    Special Requests   Final    NONE Performed at [facility], [place] [address], [place], [place] [address]    Culture >=100,000 COLONIES/mL ESCHERICHIA COLI (A)  Final   Report Status 11/02/2022 FINAL  Final   Organism ID, Bacteria ESCHERICHIA COLI (A)  Final      Susceptibility   Escherichia coli - MIC*    AMPICILLIN >=32 RESISTANT Resistant     CEFAZOLIN <=4 SENSITIVE Sensitive     CEFEPIME <=0.12 SENSITIVE Sensitive     CEFTRIAXONE <=0.25 SENSITIVE Sensitive     CIPROFLOXACIN <=0.25 SENSITIVE Sensitive     GENTAMICIN <=1 SENSITIVE Sensitive     IMIPENEM <=0.25 SENSITIVE Sensitive     NITROFURANTOIN <=16 SENSITIVE Sensitive      TRIMETH/SULFA <=20 SENSITIVE  Sensitive     AMPICILLIN/SULBACTAM 16 INTERMEDIATE Intermediate     PIP/TAZO <=4 SENSITIVE Sensitive     * >=100,000 COLONIES/mL ESCHERICHIA COLI  Culture, blood (routine x 2)     Status: None (Preliminary result)   Collection Time: 10/30/22  9:02 PM   Specimen: BLOOD  Result Value Ref Range Status   Specimen Description   Final    BLOOD Performed at [place] [address] [place], [place] [address]    Special Requests   Final    BOTTLES DRAWN AEROBIC AND ANAEROBIC Blood Culture adequate volume Performed at [place] [address], [place], [place] [address]    Culture   Final    NO GROWTH 4 DAYS Performed at [facility], [place] [address] [place], [place] [address]    Report Status PENDING  Incomplete  Culture, blood (routine x 2)     Status: None (Preliminary result)   Collection Time: 10/31/22  7:02 AM   Specimen: BLOOD RIGHT HAND  Result Value Ref Range Status   Specimen Description   Final    BLOOD RIGHT HAND Performed at [facility], [place] [address] [place], [place] [address]    Special Requests   Final    AEROBIC BOTTLE ONLY Blood Culture adequate volume Performed at [place] [address], [place], [place] [address]    Culture   Final    NO GROWTH 4 DAYS Performed at [facility], [place] [address], [place], [place] [address]    Report Status PENDING  Incomplete  MRSA Next Gen by PCR, Nasal     Status: None   Collection Time: 11/01/22  1:03 PM   Specimen: Nasal Mucosa; Nasal Swab  Result Value Ref Range Status   MRSA by PCR Next Gen NOT DETECTED NOT DETECTED Final    Comment: (NOTE) The GeneXpert MRSA Assay (FDA approved for NASAL specimens only), is one component of a comprehensive MRSA colonization surveillance program. It is not intended to diagnose MRSA infection nor to guide or monitor treatment for MRSA infections. Test performance is not  FDA approved in patients less than 44 years old. Performed at [facility], [place] [address], [place], [place] [address]      Medications:    acetaminophen  1,000 mg Oral TID   apixaban  5 mg Oral BID   Chlorhexidine Gluconate Cloth  6 each Topical Daily   feeding supplement  237 mL Oral TID BM   hydrocortisone sod succinate (SOLU-CORTEF) inj  25 mg Intravenous Q12H   oxyCODONE  40 mg Oral Q12H   pantoprazole  40 mg Oral BID AC   polyethylene glycol  17 g Oral BID   senna-docusate  1 tablet Oral BID   sodium chloride flush  10-40 mL Intracatheter Q12H   thiamine  100 mg Oral Daily   Continuous Infusions:  cefTRIAXone (ROCEPHIN)  IV 2 g (11/04/22 2018)   methocarbamol (ROBAXIN) IV        LOS: 5 days   Charlynne Cousins  [facility]  11/05/2022, 10:06 AM

## 2022-11-06 DIAGNOSIS — E861 Hypovolemia: Secondary | ICD-10-CM | POA: Diagnosis not present

## 2022-11-06 DIAGNOSIS — I9589 Other hypotension: Secondary | ICD-10-CM | POA: Diagnosis not present

## 2022-11-06 DIAGNOSIS — T83511A Infection and inflammatory reaction due to indwelling urethral catheter, initial encounter: Secondary | ICD-10-CM | POA: Diagnosis not present

## 2022-11-06 DIAGNOSIS — N390 Urinary tract infection, site not specified: Secondary | ICD-10-CM | POA: Diagnosis not present

## 2022-11-06 MED ORDER — OXYCODONE HCL 5 MG PO TABS
15.0000 mg | ORAL_TABLET | ORAL | Status: DC | PRN
  Administered 2022-11-07 – 2022-11-09 (×10): 15 mg via ORAL
  Filled 2022-11-06 (×10): qty 3

## 2022-11-06 NOTE — Progress Notes (Signed)
[facility] PROGRESS NOTE    Progress Note  Jason Lyons  XTK:[id number] DOB: 1956-01-25 DOA: 10/30/2022 PCP: Pcp, No     Brief Narrative:   Jason Lyons is an 67 y.o. 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 urinary retention status post Foley catheter and thrombocytopenia with a history of PE on Eliquis Comes in with uncontrollable pain known to have a pathologic fracture of T4 status post radiation at [facility] on 09/30/2023 readmitted on 10/12/2022 at [facility] with right-sided chest pain radiating to her back found to have T6 spinal metastases as well as new scattered pulmonary nodules neurosurgery was consulted and did not feel the patient was in a Perative candidate was given Decadron and transferred to [place] to consider radiation therapy as palliative measures.  Started on radiation continue on Decadron and pain management.  Started having urinary retention Foley catheter inserted.  After completing his radiation therapy on 10/26/2022 was discharged to skilled on 10/28/2022 and completed his Decadron taper.   Assessment/Plan:   Hypotension: Multifactorial likely due to dehydration and probably UTI urine culture grew more than 100,000 colonies of E. coli. His catheter was exchanged. Continue taper down steroids. Continue Rocephin. Palliative care met with family and oncologist he was not a candidate for further treatment. We decided to move towards hospice care, hospice care to [facility] to meet with him today family is not able to provide the care, he will probably need to go to skilled with hospice to residential hospice facility. Patient is stable to be transferred to residential hospice facility or skilled nursing facility with hospice.  E. coli UTI: Urine culture grew more than 100,000 colonies of E. coli on IV Rocephin. Continue IV Rocephin  for 7 days.  Pulmonary embolism: Continue Eliquis.  Severe constipation: Continue MiraLAX p.o. twice daily and Senokot.  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 He completed palliative radiation during last hospitalization and started on Decadron, steroids were not available at skilled. Patient was not a candidate for inpatient rehab. Palliative Care has been managing his pain.  Metastatic cholangiocarcinoma diagnosed in 2022: Family to move towards hospice care, family to meet with hospice care of [place] Continue current pain management.  Acute urinary retention: Felt to be likely secondary to cord compression currently with Foley catheter and will follow-up with urology if needed as an outpatient.  Anterior chest/scapular pain: Cardiac workup negative continue PPI and GI cocktail.  Moderate malnutrition in the context of chronic illness: Noted.  Hyponatremia: Likely due to hypovolemia resolved with hydration.  Thrombocytopenia: Likely due to chronic illness his platelets have remained relatively stable.  Electrolyte imbalance hypokalemia/hypophosphatemia: Try to keep potassium greater than 4 and phosphorus greater than 3. Repleted orally phosphorus is improved, potassium continues to be low continue replete orally.  Significant results are present on admission stage II. RN Pressure Injury Documentation: Pressure Injury 10/31/22 Buttocks Right Stage 2 -  Partial thickness loss of dermis presenting as a shallow open injury with a red, pink wound bed without slough. (Active)  10/31/22 0519  Location: Buttocks  Location Orientation: Right  Staging: Stage 2 -  Partial thickness loss of dermis presenting as a shallow open injury with a red, pink wound bed without slough.  Wound Description (Comments):  Present on Admission: Yes  Dressing Type Foam - Lift dressing to assess site every shift 11/05/22 2226   DVT prophylaxis: lovenox Family Communication:none Status is: Inpatient Remains inpatient appropriate because: Uncontrollable pain    Code Status:     Code Status Orders  (From admission, onward)           Start     Ordered   10/30/22 2119  Full code  Continuous       Question:  By:  Answer:  Consent: discussion documented in EHR   10/30/22 2120           Code Status History     Date Active Date Inactive Code Status Order ID Comments User Context   10/13/2022 0536 10/29/2022 1450 Full Code [id number]  [name], MD ED   05/13/2021 2330 05/19/2021 2025 Full Code [id number]  Rise Patience, MD Inpatient      Advance Directive Documentation    Flowsheet Row Most Recent Value  Type of Advance Directive Healthcare Power of Attorney  Pre-existing out of facility DNR order (yellow form or pink MOST form) --  "MOST" Form in Place? --         IV Access:   Peripheral IV   Procedures and diagnostic studies:   No results found.   Medical Consultants:   None.   Subjective:    Jason Lyons pain not controlled  Objective:    Vitals:   11/05/22 0512 11/05/22 1211 11/05/22 2027 11/06/22 0446  BP: 110/72 118/72 113/74 96/71  Pulse: 70 100 73 79  Resp: '18 20 12 14  '$ Temp: 98.1 F (36.7 C) 98 F (36.7 C) 97.8 F (36.6 C) 97.9 F (36.6 C)  TempSrc: Oral  Oral Oral  SpO2: 98% 97% 97% 99%  Weight:      Height:       SpO2: 99 % FiO2 (%): 21 %   Intake/Output Summary (Last 24 hours) at 11/06/2022 1032 Last data filed at 11/06/2022 0500 Gross per 24 hour  Intake 120 ml  Output 2200 ml  Net -2080 ml    Filed Weights   11/02/22 0412  Weight: 69 kg    Exam: General exam: In no acute distress. Respiratory system: Good air movement and clear to auscultation. Cardiovascular system: S1 & S2 heard, RRR. No JVD. Gastrointestinal system:  Abdomen is nondistended, soft and nontender.  Extremities: No pedal edema. Skin: No rashes, lesions or ulcers Psychiatry: Judgement and insight appear normal. Mood & affect appropriate.    Data Reviewed:    Labs: Basic Metabolic Panel: Recent Labs  Lab 10/30/22 2212 10/31/22 0702 11/01/22 0549 11/02/22 1010 11/03/22 0354 11/04/22 0243  NA  --  139 136 137 140 142  K  --  3.9 4.0 3.2* 3.7 3.4*  CL  --  108 111 107 105 104  CO2  --  '27 22 25 27 29  '$ GLUCOSE  --  104* 114* 175* 201* 130*  BUN  --  '21 20 19 18 19  '$ CREATININE  --  0.47* 0.44* 0.51* 0.40* 0.44*  CALCIUM  --  7.9* 7.7* 7.8* 7.9* 7.9*  MG 1.9 2.4 2.1 2.1 2.2  --   PHOS 3.3 3.3  --  1.8* 1.7* 2.5    GFR Estimated Creatinine Clearance: 76.9 mL/min (A) (by C-G formula based on SCr of 0.44 mg/dL (L)). Liver Function Tests: Recent Labs  Lab 10/30/22 1900 10/31/22 0702 11/01/22 0549 11/02/22 1010 11/03/22 0354 11/04/22 0243  AST 48* 33  24  --   --   --   ALT 148* 117* 71*  --   --   --   ALKPHOS 184* 156* 101  --   --   --   BILITOT 1.0 0.8 0.5  --   --   --   PROT 5.9* 5.3* 4.4*  --   --   --   ALBUMIN 2.7* 2.2* 2.1* 2.3* 2.2* 2.3*    No results for input(s): "LIPASE", "AMYLASE" in the last 168 hours. No results for input(s): "AMMONIA" in the last 168 hours. Coagulation profile Recent Labs  Lab 10/30/22 2212  INR 1.6*    COVID-19 Labs  No results for input(s): "DDIMER", "FERRITIN", "LDH", "CRP" in the last 72 hours.  Lab Results  Component Value Date   [place] NEGATIVE 05/13/2021    CBC: Recent Labs  Lab 10/30/22 1900 10/31/22 0702 11/01/22 0549 11/02/22 1010 11/03/22 0354  WBC 18.4* 12.7* 8.1 6.7 6.2  NEUTROABS 17.4*  --  7.6  --   --   HGB 14.6 13.6 11.2* 12.1* 11.9*  HCT 43.1 40.3 33.6* 36.4* 34.7*  MCV 96.9 97.6 98.2 98.4 96.9  PLT 82* 65* 51* 56* 51*    Cardiac Enzymes: Recent Labs  Lab 10/30/22 2212  CKTOTAL 46*    BNP (last 3 results) No results for input(s):  "PROBNP" in the last 8760 hours. CBG: Recent Labs  Lab 11/05/22 2024  GLUCAP 101*   D-Dimer: No results for input(s): "DDIMER" in the last 72 hours. Hgb A1c: No results for input(s): "HGBA1C" in the last 72 hours. Lipid Profile: No results for input(s): "CHOL", "HDL", "LDLCALC", "TRIG", "CHOLHDL", "LDLDIRECT" in the last 72 hours. Thyroid function studies: No results for input(s): "TSH", "T4TOTAL", "T3FREE", "THYROIDAB" in the last 72 hours.  Invalid input(s): "FREET3" Anemia work up: No results for input(s): "VITAMINB12", "FOLATE", "FERRITIN", "TIBC", "IRON", "RETICCTPCT" in the last 72 hours. Sepsis Labs: Recent Labs  Lab 10/30/22 2049 10/30/22 2212 10/31/22 0702 11/01/22 0549 11/02/22 1010 11/03/22 0354  PROCALCITON  --  4.35  --   --   --   --   WBC  --   --  12.7* 8.1 6.7 6.2  LATICACIDVEN 1.0 0.9  --   --   --   --     Microbiology Recent Results (from the past 240 hour(s))  Urine Culture     Status: Abnormal   Collection Time: 10/30/22  8:30 PM   Specimen: Urine, Catheterized  Result Value Ref Range Status   Specimen Description   Final    URINE, CATHETERIZED Performed at [facility], [place] [address] [place], [place] [address]    Special Requests   Final    NONE Performed at [facility], [place] [address], [place], [place] [address]    Culture >=100,000 COLONIES/mL ESCHERICHIA COLI (A)  Final   Report Status 11/02/2022 FINAL  Final   Organism ID, Bacteria ESCHERICHIA COLI (A)  Final      Susceptibility   Escherichia coli - MIC*    AMPICILLIN >=32 RESISTANT Resistant     CEFAZOLIN <=4 SENSITIVE Sensitive     CEFEPIME <=0.12 SENSITIVE Sensitive     CEFTRIAXONE <=0.25 SENSITIVE Sensitive     CIPROFLOXACIN <=0.25 SENSITIVE Sensitive     GENTAMICIN <=1 SENSITIVE Sensitive     IMIPENEM <=0.25 SENSITIVE Sensitive     NITROFURANTOIN <=16 SENSITIVE Sensitive     TRIMETH/SULFA <=20 SENSITIVE Sensitive      AMPICILLIN/SULBACTAM 16  INTERMEDIATE Intermediate     PIP/TAZO <=4 SENSITIVE Sensitive     * >=100,000 COLONIES/mL ESCHERICHIA COLI  Culture, blood (routine x 2)     Status: None   Collection Time: 10/30/22  9:02 PM   Specimen: BLOOD  Result Value Ref Range Status   Specimen Description   Final    BLOOD Performed at [place] [address], [place], [place] [address]    Special Requests   Final    BOTTLES DRAWN AEROBIC AND ANAEROBIC Blood Culture adequate volume Performed at [place] [address], [place], [place] [address]    Culture   Final    NO GROWTH 5 DAYS Performed at [facility], [place] [address], [place], [place] [address]    Report Status 11/05/2022 FINAL  Final  Culture, blood (routine x 2)     Status: None   Collection Time: 10/31/22  7:02 AM   Specimen: BLOOD RIGHT HAND  Result Value Ref Range Status   Specimen Description   Final    BLOOD RIGHT HAND Performed at [facility], [place] [address], [place], [place] [address]    Special Requests   Final    AEROBIC BOTTLE ONLY Blood Culture adequate volume Performed at [facility] [address], [place], [place] [address]    Culture   Final    NO GROWTH 5 DAYS Performed at [facility], [place] [address], [place],  [address]    Report Status 11/05/2022 FINAL  Final  MRSA Next Gen by PCR, Nasal     Status: None   Collection Time: 11/01/22  1:03 PM   Specimen: Nasal Mucosa; Nasal Swab  Result Value Ref Range Status   MRSA by PCR Next Gen NOT DETECTED NOT DETECTED Final    Comment: (NOTE) The GeneXpert MRSA Assay (FDA approved for NASAL specimens only), is one component of a comprehensive MRSA colonization surveillance program. It is not intended to diagnose MRSA infection nor to guide or monitor treatment for MRSA infections. Test performance is not FDA approved in patients less than 55 years old. Performed at [facility], [place] [address] [place],  [address]      Medications:    acetaminophen  1,000 mg Oral TID   apixaban  5 mg Oral BID   Chlorhexidine Gluconate Cloth  6 each Topical Daily   feeding supplement  237 mL Oral TID BM   oxyCODONE  40 mg Oral Q12H   pantoprazole  40 mg Oral BID AC   senna-docusate  1 tablet Oral BID   Continuous Infusions:  cefTRIAXone (ROCEPHIN)  IV 2 g (11/05/22 2144)   methocarbamol (ROBAXIN) IV        LOS: 6 days   Charlynne Cousins  [facility]  11/06/2022, 10:32 AM

## 2022-11-07 DIAGNOSIS — T83511A Infection and inflammatory reaction due to indwelling urethral catheter, initial encounter: Secondary | ICD-10-CM | POA: Diagnosis not present

## 2022-11-07 DIAGNOSIS — C221 Intrahepatic bile duct carcinoma: Secondary | ICD-10-CM | POA: Diagnosis not present

## 2022-11-07 DIAGNOSIS — C249 Malignant neoplasm of biliary tract, unspecified: Secondary | ICD-10-CM | POA: Diagnosis not present

## 2022-11-07 DIAGNOSIS — R52 Pain, unspecified: Secondary | ICD-10-CM | POA: Diagnosis not present

## 2022-11-07 NOTE — Progress Notes (Signed)
[facility] PROGRESS NOTE    Progress Note  Jason Lyons  GYK:[id number] DOB: 12-18-1955 DOA: 10/30/2022 PCP: Pcp, No     Brief Narrative:   Jason Lyons is an 67 y.o. 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 with right-sided chest pain radiating to her back found to have T6 spinal metastases as well as new scattered pulmonary nodules neurosurgery was consulted and did not feel the patient was in a Perative candidate was given Decadron and transferred to [place] to consider radiation therapy as palliative measures.  Started on radiation continue on Decadron and pain management.  Started having urinary retention Foley catheter inserted.  After completing his radiation therapy on 10/26/2022 was discharged to skilled on 10/28/2022 and completed his Decadron taper.  Patient is medically stable to be transferred to skilled nursing facility with hospice.  Assessment/Plan:   Hypotension: Multifactorial likely due to dehydration and probably UTI urine culture grew more than 100,000 colonies of E. coli. His catheter was exchanged. Steroid taper has been completed. Palliative care met with family and oncologist he was not a candidate for further treatment. We decided to move towards hospice care, hospice care to [place] to met with family, they are not able to provide the care, he will probably need to go to skilled with hospice to residential hospice facility. Patient is stable to be transferred to residential hospice facility or skilled nursing facility with hospice.  E. coli UTI: Completed his course of  antibiotics. Pulmonary embolism: Continue Eliquis.  Severe constipation: Continue MiraLAX p.o. twice daily and Senokot.  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 He completed palliative radiation during last hospitalization and started on Decadron, steroids were not available at skilled. Patient was not a candidate for inpatient rehab. Palliative Care has been managing his pain.  Metastatic cholangiocarcinoma diagnosed in 2022: Family to move towards hospice care, family to meet with hospice care of [place] Continue current pain management.  Acute urinary retention: Felt to be likely secondary to cord compression currently with Foley catheter and will follow-up with urology if needed as an outpatient.  Anterior chest/scapular pain: Cardiac workup negative continue PPI and GI cocktail.  Moderate malnutrition in the context of chronic illness: Noted.  Hyponatremia: Likely due to hypovolemia resolved with hydration.  Thrombocytopenia: Likely due to chronic illness his platelets have remained relatively stable.  Electrolyte imbalance hypokalemia/hypophosphatemia: Try to keep potassium greater than 4 and phosphorus greater than 3. Repleted orally phosphorus is improved, potassium was repleted orally.  Significant results are present on admission stage II. RN Pressure Injury Documentation: Pressure Injury 10/31/22 Buttocks Right Stage 2 -  Partial thickness loss of dermis presenting as a shallow open injury with a red, pink wound bed without slough. (Active)  10/31/22 0519  Location: Buttocks  Location Orientation: Right  Staging: Stage 2 -  Partial thickness loss of dermis presenting as a shallow open injury with a red, pink wound bed without slough.  Wound Description (Comments):   Present on Admission: Yes   Dressing  Type Foam - Lift dressing to assess site every shift 11/06/22 2200   DVT prophylaxis: lovenox Family Communication:none Status is: Inpatient Remains inpatient appropriate because: Uncontrollable pain    Code Status:     Code Status Orders  (From admission, onward)           Start     Ordered   10/30/22 2119  Full code  Continuous       Question:  By:  Answer:  Consent: discussion documented in EHR   10/30/22 2120           Code Status History     Date Active Date Inactive Code Status Order ID Comments User Context   10/13/2022 0536 10/29/2022 1450 Full Code [id number]  [name], MD ED   05/13/2021 2330 05/19/2021 2025 Full Code [id number]  Rise Patience, MD Inpatient      Advance Directive Documentation    Flowsheet Row Most Recent Value  Type of Advance Directive Healthcare Power of Attorney  Pre-existing out of facility DNR order (yellow form or pink MOST form) --  "MOST" Form in Place? --         IV Access:   Peripheral IV   Procedures and diagnostic studies:   No results found.   Medical Consultants:   None.   Subjective:    Jason Lyons pain not controlled  Objective:    Vitals:   11/06/22 1300 11/06/22 1318 11/06/22 2037 11/07/22 0530  BP: (!) 89/54 104/76 90/60 100/70  Pulse: (!) 41 84 79 69  Resp: (!) 21  16   Temp: 98.8 F (37.1 C) 98.2 F (36.8 C) 98.9 F (37.2 C) 98.2 F (36.8 C)  TempSrc: Oral Oral Oral Oral  SpO2: 97% 97% 98% 97%  Weight:      Height:       SpO2: 97 % FiO2 (%): 21 %   Intake/Output Summary (Last 24 hours) at 11/07/2022 0900 Last data filed at 11/07/2022 0540 Gross per 24 hour  Intake --  Output 2270 ml  Net -2270 ml    Filed Weights   11/02/22 0412  Weight: 69 kg    Exam: General exam: In no acute distress. Respiratory system: Good air movement and clear to auscultation. Cardiovascular system: S1 & S2 heard, RRR. No JVD. Gastrointestinal system: Abdomen is nondistended,  soft and nontender.  Extremities: No pedal edema. Skin: No rashes, lesions or ulcers Psychiatry: Judgement and insight appear normal. Mood & affect appropriate.    Data Reviewed:    Labs: Basic Metabolic Panel: Recent Labs  Lab 11/01/22 0549 11/02/22 1010 11/03/22 0354 11/04/22 0243  NA 136 137 140 142  K 4.0 3.2* 3.7 3.4*  CL 111 107 105 104  CO2 '22 25 27 29  '$ GLUCOSE 114* 175* 201* 130*  BUN '20 19 18 19  '$ CREATININE 0.44* 0.51* 0.40* 0.44*  CALCIUM 7.7* 7.8* 7.9* 7.9*  MG 2.1 2.1 2.2  --   PHOS  --  1.8* 1.7* 2.5    GFR Estimated Creatinine Clearance: 76.9 mL/min (A) (by C-G formula based on SCr of 0.44 mg/dL (L)). Liver Function Tests: Recent Labs  Lab 11/01/22 0549 11/02/22 1010 11/03/22 0354 11/04/22 0243  AST 24  --   --   --   ALT 71*  --   --   --   ALKPHOS 101  --   --   --   BILITOT 0.5  --   --   --   PROT 4.4*  --   --   --  ALBUMIN 2.1* 2.3* 2.2* 2.3*    No results for input(s): "LIPASE", "AMYLASE" in the last 168 hours. No results for input(s): "AMMONIA" in the last 168 hours. Coagulation profile No results for input(s): "INR", "PROTIME" in the last 168 hours.  COVID-19 Labs  No results for input(s): "DDIMER", "FERRITIN", "LDH", "CRP" in the last 72 hours.  Lab Results  Component Value Date   [place] NEGATIVE 05/13/2021    CBC: Recent Labs  Lab 11/01/22 0549 11/02/22 1010 11/03/22 0354  WBC 8.1 6.7 6.2  NEUTROABS 7.6  --   --   HGB 11.2* 12.1* 11.9*  HCT 33.6* 36.4* 34.7*  MCV 98.2 98.4 96.9  PLT 51* 56* 51*    Cardiac Enzymes: No results for input(s): "CKTOTAL", "CKMB", "CKMBINDEX", "TROPONINI" in the last 168 hours.  BNP (last 3 results) No results for input(s): "PROBNP" in the last 8760 hours. CBG: Recent Labs  Lab 11/05/22 2024  GLUCAP 101*    D-Dimer: No results for input(s): "DDIMER" in the last 72 hours. Hgb A1c: No results for input(s): "HGBA1C" in the last 72 hours. Lipid Profile: No results for  input(s): "CHOL", "HDL", "LDLCALC", "TRIG", "CHOLHDL", "LDLDIRECT" in the last 72 hours. Thyroid function studies: No results for input(s): "TSH", "T4TOTAL", "T3FREE", "THYROIDAB" in the last 72 hours.  Invalid input(s): "FREET3" Anemia work up: No results for input(s): "VITAMINB12", "FOLATE", "FERRITIN", "TIBC", "IRON", "RETICCTPCT" in the last 72 hours. Sepsis Labs: Recent Labs  Lab 11/01/22 0549 11/02/22 1010 11/03/22 0354  WBC 8.1 6.7 6.2    Microbiology Recent Results (from the past 240 hour(s))  Urine Culture     Status: Abnormal   Collection Time: 10/30/22  8:30 PM   Specimen: Urine, Catheterized  Result Value Ref Range Status   Specimen Description   Final    URINE, CATHETERIZED Performed at [facility] [address], [place], [place] [address]    Special Requests   Final    NONE Performed at [facility], [place] [address], [place], [place] [address]    Culture >=100,000 COLONIES/mL ESCHERICHIA COLI (A)  Final   Report Status 11/02/2022 FINAL  Final   Organism ID, Bacteria ESCHERICHIA COLI (A)  Final      Susceptibility   Escherichia coli - MIC*    AMPICILLIN >=32 RESISTANT Resistant     CEFAZOLIN <=4 SENSITIVE Sensitive     CEFEPIME <=0.12 SENSITIVE Sensitive     CEFTRIAXONE <=0.25 SENSITIVE Sensitive     CIPROFLOXACIN <=0.25 SENSITIVE Sensitive     GENTAMICIN <=1 SENSITIVE Sensitive     IMIPENEM <=0.25 SENSITIVE Sensitive     NITROFURANTOIN <=16 SENSITIVE Sensitive     TRIMETH/SULFA <=20 SENSITIVE Sensitive     AMPICILLIN/SULBACTAM 16 INTERMEDIATE Intermediate     PIP/TAZO <=4 SENSITIVE Sensitive     * >=100,000 COLONIES/mL ESCHERICHIA COLI  Culture, blood (routine x 2)     Status: None   Collection Time: 10/30/22  9:02 PM   Specimen: BLOOD  Result Value Ref Range Status   Specimen Description   Final    BLOOD Performed at [facility] [address], [place], [place] [address]    Special  Requests   Final    BOTTLES DRAWN AEROBIC AND ANAEROBIC Blood Culture adequate volume Performed at [facility] [address], [place],  [address]    Culture   Final    NO GROWTH 5 DAYS Performed at [facility], [place],  [place] [address]    Report Status 11/05/2022 FINAL  Final  Culture, blood (routine x 2)     Status: None   Collection Time: 10/31/22  7:02 AM   Specimen: BLOOD RIGHT HAND  Result Value Ref Range Status   Specimen Description   Final    BLOOD RIGHT HAND Performed at [facility], [place] [address] [place] [address], [place], [place] [address]    Special Requests   Final    AEROBIC BOTTLE ONLY Blood Culture adequate volume Performed at [facility] [address] [place], [place] [address]    Culture   Final    NO GROWTH 5 DAYS Performed at [facility], [place] [address] [place], [place] [address]    Report Status 11/05/2022 FINAL  Final  MRSA Next Gen by PCR, Nasal     Status: None   Collection Time: 11/01/22  1:03 PM   Specimen: Nasal Mucosa; Nasal Swab  Result Value Ref Range Status   MRSA by PCR Next Gen NOT DETECTED NOT DETECTED Final    Comment: (NOTE) The GeneXpert MRSA Assay (FDA approved for NASAL specimens only), is one component of a comprehensive MRSA colonization surveillance program. It is not intended to diagnose MRSA infection nor to guide or monitor treatment for MRSA infections. Test performance is not FDA approved in patients less than 27 years old. Performed at [facility], [place] [address], [place], [place] [address]      Medications:    acetaminophen  1,000 mg Oral TID   apixaban  5 mg Oral BID   Chlorhexidine Gluconate Cloth  6 each Topical Daily   feeding supplement  237 mL Oral TID BM   oxyCODONE  40 mg Oral Q12H   pantoprazole  40 mg Oral BID AC   senna-docusate  1 tablet Oral BID   Continuous Infusions:  methocarbamol (ROBAXIN) IV         LOS: 7 days   Charlynne Cousins  [facility]  11/07/2022, 9:00 AM

## 2022-11-08 DIAGNOSIS — C221 Intrahepatic bile duct carcinoma: Secondary | ICD-10-CM | POA: Diagnosis not present

## 2022-11-08 DIAGNOSIS — T83511A Infection and inflammatory reaction due to indwelling urethral catheter, initial encounter: Secondary | ICD-10-CM | POA: Diagnosis not present

## 2022-11-08 DIAGNOSIS — R52 Pain, unspecified: Secondary | ICD-10-CM | POA: Diagnosis not present

## 2022-11-08 DIAGNOSIS — C249 Malignant neoplasm of biliary tract, unspecified: Secondary | ICD-10-CM | POA: Diagnosis not present

## 2022-11-08 NOTE — Care Management Important Message (Signed)
Important Message  Patient Details No IM given/ Hospice Name: Hermen Mario MRN: [id number] Date of Birth: Feb 10, 1956   Medicare Important Message Given:  No     Kerin Salen 11/08/2022, 2:02 PM

## 2022-11-08 NOTE — TOC Progression Note (Signed)
Transition of Care [facility]) - Progression Note    Patient Details  Name: Jason Lyons MRN: [id number] Date of Birth: April 14, 1956  Transition of Care [facility]) CM/SW Contact  Leeroy Cha, RN Phone Number: 11/08/2022, 9:45 AM  Clinical Narrative:    Leeroy Bock at given phone number at [phone] and requested call back.   Expected Discharge Plan: [place] (TBD) Barriers to Discharge: Continued Medical Work up  Expected Discharge Plan and Services   Discharge Planning Services: CM Consult   Living arrangements for the past 2 months: Single Family Home                                       Social Determinants of Health (SDOH) Interventions SDOH Screenings   Food Insecurity: No Food Insecurity (11/01/2022)  Housing: Low Risk  (11/01/2022)  Transportation Needs: No Transportation Needs (11/01/2022)  Utilities: Not At Risk (11/01/2022)  Tobacco Use: Medium Risk (10/30/2022)    Readmission Risk Interventions   Row Labels 11/04/2022   11:32 AM  Readmission Risk Prevention Plan   Section Header. No data exists in this row.   Transportation Screening   Complete  PCP or Specialist Appt within 3-5 Days   Complete  HRI or [place]   Complete  Social Work Consult for [place] Planning/Counseling   Complete  Palliative Care Screening   Complete  Medication Review Press photographer)   Complete

## 2022-11-08 NOTE — Progress Notes (Signed)
   Pt was referred to hospice services for hospice services at home. I met with family and discussed the transition to comfort care and the pt having 24/7 care givers. The pt's son [name] and Brother were present Aaron Edelman. Interpretor was used for pt to participate. They agreed to have 24/7 care by family support possible looking in to hiring additional help as well.   The pt was approved for hospice services at home. We did order equipment of hospital bed, Over bed table and wheelchair ordered for delivery. We are acceptable to pt coming home with foley cath in place for comfort.   Spoke to son Otho Ket today and this equipment is in the home. He is unsure when pt will d/c home but will need to go by ambulance.   Webb Silversmith RN [phone]

## 2022-11-08 NOTE — Progress Notes (Signed)
[facility] PROGRESS NOTE    Progress Note  Jason Lyons  ZWC:[id number] DOB: 1956-09-24 DOA: 10/30/2022 PCP: Pcp, No     Brief Narrative:   Jason Lyons is an 67 y.o. 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 urinary retention status post Foley catheter and thrombocytopenia with a history of PE on Eliquis Comes in with uncontrollable pain known to have a pathologic fracture of T4 status post radiation at [facility] on 09/30/2023 readmitted on 10/12/2022 at [facility] with right-sided chest pain radiating to her back found to have T6 spinal metastases as well as new scattered pulmonary nodules neurosurgery was consulted and did not feel the patient was in a Perative candidate was given Decadron and transferred to [place] to consider radiation therapy as palliative measures.  Started on radiation continue on Decadron and pain management.  Started having urinary retention Foley catheter inserted.  After completing his radiation therapy on 10/26/2022 was discharged to skilled on 10/28/2022 and completed his Decadron taper.  Patient is medically stable to be transferred to skilled nursing facility with hospice.  Assessment/Plan:   Hypotension: Multifactorial likely due to dehydration and probably UTI urine culture grew more than 100,000 colonies of E. coli. His catheter was exchanged. Steroid taper has been completed. Palliative care met with family and oncologist he was not a candidate for further treatment. Family decided to move towards hospice care, hospice care to [place] to met with family, they are not able to provide the care as he has no caregiver at home, he will probably need to go to skilled with hospice to residential hospice facility. Patient is stable to be transferred to residential hospice facility or skilled nursing facility with hospice.  E.  coli UTI: Completed his course of antibiotics.  Pulmonary embolism: Continue Eliquis.  Severe constipation: Continue MiraLAX p.o. twice daily and Senokot.  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 He completed palliative radiation during last hospitalization and started on Decadron, steroids were not available at skilled. Patient was not a candidate for inpatient rehab. Palliative Care has been managing his pain.  Metastatic cholangiocarcinoma diagnosed in 2022: Family to move towards hospice care, family to meet with hospice care of [place] Continue current pain management.  Acute urinary retention: Felt to be likely secondary to cord compression currently with Foley catheter and will follow-up with urology if needed as an outpatient.  Anterior chest/scapular pain: Cardiac workup negative continue PPI and GI cocktail.  Moderate malnutrition in the context of chronic illness: Noted.  Hyponatremia: Likely due to hypovolemia resolved with hydration.  Thrombocytopenia: Likely due to chronic illness his platelets have remained relatively stable.  Electrolyte imbalance hypokalemia/hypophosphatemia: Try to keep potassium greater than 4 and phosphorus greater than 3. Repleted orally phosphorus is improved, potassium was repleted orally.  Significant results are present on admission stage II. RN Pressure Injury Documentation: Pressure Injury 10/31/22 Buttocks Right Stage 2 -  Partial thickness loss of dermis presenting as a shallow open injury with a red, pink wound bed without slough. (Active)  10/31/22 0519  Location: Buttocks  Location Orientation: Right  Staging: Stage 2 -  Partial thickness loss of dermis presenting as a shallow open injury with a red, pink wound bed without slough.  Wound Description  (Comments):  Present on Admission: Yes  Dressing Type Foam - Lift dressing to assess site every shift 11/08/22 0300   DVT prophylaxis: lovenox Family Communication:none Status is: Inpatient Remains inpatient appropriate because: Uncontrollable pain    Code Status:     Code Status Orders  (From admission, onward)           Start     Ordered   10/30/22 2119  Full code  Continuous       Question:  By:  Answer:  Consent: discussion documented in EHR   10/30/22 2120           Code Status History     Date Active Date Inactive Code Status Order ID Comments User Context   10/13/2022 0536 10/29/2022 1450 Full Code [id number]  [name], MD ED   05/13/2021 2330 05/19/2021 2025 Full Code [id number]  Rise Patience, MD Inpatient      Advance Directive Documentation    Flowsheet Row Most Recent Value  Type of Advance Directive Healthcare Power of Attorney  Pre-existing out of facility DNR order (yellow form or pink MOST form) --  "MOST" Form in Place? --         IV Access:   Peripheral IV   Procedures and diagnostic studies:   No results found.   Medical Consultants:   None.   Subjective:    Jason Lyons pain not controlled  Objective:    Vitals:   11/07/22 0530 11/07/22 1328 11/07/22 2023 11/08/22 0402  BP: 100/70 (!) 84/58 (!) 91/57 94/60  Pulse: 69 70 92 90  Resp:  17 19   Temp: 98.2 F (36.8 C) 98.3 F (36.8 C) 99 F (37.2 C) 98.9 F (37.2 C)  TempSrc: Oral Oral Oral Oral  SpO2: 97% 97% 98% 98%  Weight:    68.9 kg  Height:       SpO2: 98 % FiO2 (%): 21 %   Intake/Output Summary (Last 24 hours) at 11/08/2022 0913 Last data filed at 11/07/2022 2300 Gross per 24 hour  Intake 600 ml  Output 1975 ml  Net -1375 ml    Filed Weights   11/02/22 0412 11/08/22 0402  Weight: 69 kg 68.9 kg    Exam: General exam: In no acute distress. Respiratory system: Good air movement and clear to auscultation. Cardiovascular system: S1 & S2  heard, RRR. No JVD. Gastrointestinal system: Abdomen is nondistended, soft and nontender.  Extremities: No pedal edema. Skin: No rashes, lesions or ulcers Psychiatry: Judgement and insight appear normal. Mood & affect appropriate.    Data Reviewed:    Labs: Basic Metabolic Panel: Recent Labs  Lab 11/02/22 1010 11/03/22 0354 11/04/22 0243  NA 137 140 142  K 3.2* 3.7 3.4*  CL 107 105 104  CO2 '25 27 29  '$ GLUCOSE 175* 201* 130*  BUN '19 18 19  '$ CREATININE 0.51* 0.40* 0.44*  CALCIUM 7.8* 7.9* 7.9*  MG 2.1 2.2  --   PHOS 1.8* 1.7* 2.5    GFR Estimated Creatinine Clearance: 76.9 mL/min (A) (by C-G formula based on SCr of 0.44 mg/dL (L)). Liver Function Tests: Recent Labs  Lab 11/02/22 1010 11/03/22 0354 11/04/22 0243  ALBUMIN 2.3* 2.2* 2.3*    No results for input(s): "LIPASE", "AMYLASE" in the last 168 hours. No results for input(s): "AMMONIA" in the last 168 hours. Coagulation profile No results for input(s): "INR", "PROTIME" in the last 168 hours.  COVID-19 Labs  No results for input(s): "DDIMER", "FERRITIN", "LDH", "CRP" in the last  72 hours.  Lab Results  Component Value Date   [place] NEGATIVE 05/13/2021    CBC: Recent Labs  Lab 11/02/22 1010 11/03/22 0354  WBC 6.7 6.2  HGB 12.1* 11.9*  HCT 36.4* 34.7*  MCV 98.4 96.9  PLT 56* 51*    Cardiac Enzymes: No results for input(s): "CKTOTAL", "CKMB", "CKMBINDEX", "TROPONINI" in the last 168 hours.  BNP (last 3 results) No results for input(s): "PROBNP" in the last 8760 hours. CBG: Recent Labs  Lab 11/05/22 2024  GLUCAP 101*    D-Dimer: No results for input(s): "DDIMER" in the last 72 hours. Hgb A1c: No results for input(s): "HGBA1C" in the last 72 hours. Lipid Profile: No results for input(s): "CHOL", "HDL", "LDLCALC", "TRIG", "CHOLHDL", "LDLDIRECT" in the last 72 hours. Thyroid function studies: No results for input(s): "TSH", "T4TOTAL", "T3FREE", "THYROIDAB" in the last 72  hours.  Invalid input(s): "FREET3" Anemia work up: No results for input(s): "VITAMINB12", "FOLATE", "FERRITIN", "TIBC", "IRON", "RETICCTPCT" in the last 72 hours. Sepsis Labs: Recent Labs  Lab 11/02/22 1010 11/03/22 0354  WBC 6.7 6.2    Microbiology Recent Results (from the past 240 hour(s))  Urine Culture     Status: Abnormal   Collection Time: 10/30/22  8:30 PM   Specimen: Urine, Catheterized  Result Value Ref Range Status   Specimen Description   Final    URINE, CATHETERIZED Performed at [facility] [address] [place], [place] [address]    Special Requests   Final    NONE Performed at [facility], [place] [address], [place], [place] [address]    Culture >=100,000 COLONIES/mL ESCHERICHIA COLI (A)  Final   Report Status 11/02/2022 FINAL  Final   Organism ID, Bacteria ESCHERICHIA COLI (A)  Final      Susceptibility   Escherichia coli - MIC*    AMPICILLIN >=32 RESISTANT Resistant     CEFAZOLIN <=4 SENSITIVE Sensitive     CEFEPIME <=0.12 SENSITIVE Sensitive     CEFTRIAXONE <=0.25 SENSITIVE Sensitive     CIPROFLOXACIN <=0.25 SENSITIVE Sensitive     GENTAMICIN <=1 SENSITIVE Sensitive     IMIPENEM <=0.25 SENSITIVE Sensitive     NITROFURANTOIN <=16 SENSITIVE Sensitive     TRIMETH/SULFA <=20 SENSITIVE Sensitive     AMPICILLIN/SULBACTAM 16 INTERMEDIATE Intermediate     PIP/TAZO <=4 SENSITIVE Sensitive     * >=100,000 COLONIES/mL ESCHERICHIA COLI  Culture, blood (routine x 2)     Status: None   Collection Time: 10/30/22  9:02 PM   Specimen: BLOOD  Result Value Ref Range Status   Specimen Description   Final    BLOOD Performed at [facility] [address], [place], [place] [address]    Special Requests   Final    BOTTLES DRAWN AEROBIC AND ANAEROBIC Blood Culture adequate volume Performed at [facility] [address] [place], [place] [address]    Culture   Final    NO GROWTH 5  DAYS Performed at [facility], [place] [address] [place], [place] [address]    Report Status 11/05/2022 FINAL  Final  Culture, blood (routine x 2)     Status: None   Collection Time: 10/31/22  7:02 AM   Specimen: BLOOD RIGHT HAND  Result Value Ref Range Status   Specimen Description   Final    BLOOD RIGHT HAND Performed at [facility], [place] [address], [place], [place] [address]    Special Requests  Final    AEROBIC BOTTLE ONLY Blood Culture adequate volume Performed at [facility] [address] [place], [place] [address]    Culture   Final    NO GROWTH 5 DAYS Performed at [facility], [place] [address] [place], [place] [address]    Report Status 11/05/2022 FINAL  Final  MRSA Next Gen by PCR, Nasal     Status: None   Collection Time: 11/01/22  1:03 PM   Specimen: Nasal Mucosa; Nasal Swab  Result Value Ref Range Status   MRSA by PCR Next Gen NOT DETECTED NOT DETECTED Final    Comment: (NOTE) The GeneXpert MRSA Assay (FDA approved for NASAL specimens only), is one component of a comprehensive MRSA colonization surveillance program. It is not intended to diagnose MRSA infection nor to guide or monitor treatment for MRSA infections. Test performance is not FDA approved in patients less than 44 years old. Performed at [facility], [place] [address] [place], [place] [address]      Medications:    acetaminophen  1,000 mg Oral TID   apixaban  5 mg Oral BID   Chlorhexidine Gluconate Cloth  6 each Topical Daily   feeding supplement  237 mL Oral TID BM   oxyCODONE  40 mg Oral Q12H   pantoprazole  40 mg Oral BID AC   senna-docusate  1 tablet Oral BID   Continuous Infusions:  methocarbamol (ROBAXIN) IV        LOS: 8 days   Charlynne Cousins  [facility]  11/08/2022, 9:13 AM

## 2022-11-08 NOTE — Plan of Care (Signed)

## 2022-11-08 NOTE — Progress Notes (Signed)
PT Cancellation Note  Patient Details Name: Jason Lyons MRN: [id number] DOB: 03/18/1956   Cancelled Treatment:    Reason Eval/Treat Not Completed: Medical issues which prohibited therapy (BP). RN exiting room and pt's BP 89/62 while supine in bed, will hold until within therapeutic range.     Tori Kyera Felan PT, DPT 11/08/22, 2:30 PM

## 2022-11-08 NOTE — Progress Notes (Signed)
Nutrition Follow-up  DOCUMENTATION CODES:   Non-severe (moderate) malnutrition in context of chronic illness  INTERVENTION:   -Ensure Plus High Protein po TID, each supplement provides 350 kcal and 20 grams of protein.    -Liberalize diet to regular to maximize menu options and promote good PO.  NUTRITION DIAGNOSIS:   Moderate Malnutrition related to chronic illness, cancer and cancer related treatments as evidenced by moderate fat depletion, moderate muscle depletion.  Ongoing.  GOAL:   Patient will meet greater than or equal to 90% of their needs  Progressing.  MONITOR:   PO intake, Supplement acceptance, Weight trends, Labs, I & O's, Skin  ASSESSMENT:   67 y.o. male with medical history significant of cholangiocarcinoma, metastatic to bone .  Followed at [facility], metastatic disease to T4 and T6 resulting in paraplegia with cord compression at T6 level and encroachment on the thecal sac, urinary retention status post Foley catheter malnutrition and thrombocytopenia history of PE on Eliquis        Presented with   not controlled back pain  Pt with known adenocarcinoma of the biliary tract diagnosed in 05/13/2021 followed by Dr. Alvy Bimler on chemotherapy with cisplatin and gemcitabine.  Patient currently consuming 100% of meals. Accepting Ensure supplements. Per chart review, pt now under hospice care.   Admission weight: 152 lbs Current weight: 151 lbs  Medications: Senokot  Labs reviewed: CBGs: 101 Low K   Diet Order:   Diet Order             Diet regular Room service appropriate? Yes; Fluid consistency: Thin  Diet effective now                   EDUCATION NEEDS:   No education needs have been identified at this time  Skin:  Skin Assessment: Skin Integrity Issues: Skin Integrity Issues:: Stage II Stage II: right buttocks  Last BM:  1/28 -type 5  Height:   Ht Readings from Last 1 Encounters:  11/01/22 '5\' 5"'$  (1.651 m)    Weight:   Wt Readings  from Last 1 Encounters:  11/08/22 68.9 kg    BMI:  Body mass index is 25.28 kg/m.  Estimated Nutritional Needs:   Kcal:  1900-2100  Protein:  95-105g  Fluid:  2L/day  Clayton Bibles, MS, RD, LDN Inpatient Clinical Dietitian Contact information available via Amion

## 2022-11-09 ENCOUNTER — Encounter: Payer: Self-pay | Admitting: Hematology and Oncology

## 2022-11-09 ENCOUNTER — Other Ambulatory Visit (HOSPITAL_COMMUNITY): Payer: Self-pay

## 2022-11-09 DIAGNOSIS — T83511A Infection and inflammatory reaction due to indwelling urethral catheter, initial encounter: Secondary | ICD-10-CM | POA: Diagnosis not present

## 2022-11-09 DIAGNOSIS — C221 Intrahepatic bile duct carcinoma: Secondary | ICD-10-CM | POA: Diagnosis not present

## 2022-11-09 DIAGNOSIS — R52 Pain, unspecified: Secondary | ICD-10-CM | POA: Diagnosis not present

## 2022-11-09 DIAGNOSIS — C249 Malignant neoplasm of biliary tract, unspecified: Secondary | ICD-10-CM | POA: Diagnosis not present

## 2022-11-09 MED ORDER — HYDROMORPHONE HCL 2 MG PO TABS
1.0000 mg | ORAL_TABLET | Freq: Four times a day (QID) | ORAL | 0 refills | Status: AC | PRN
  Filled 2022-11-09: qty 6, 3d supply, fill #0

## 2022-11-09 MED ORDER — HEPARIN SOD (PORK) LOCK FLUSH 100 UNIT/ML IV SOLN
500.0000 [IU] | INTRAVENOUS | Status: AC | PRN
  Administered 2022-11-09: 500 [IU]

## 2022-11-09 MED ORDER — OXYCODONE HCL ER 40 MG PO T12A
40.0000 mg | EXTENDED_RELEASE_TABLET | Freq: Two times a day (BID) | ORAL | 0 refills | Status: AC
  Filled 2022-11-09: qty 14, 7d supply, fill #0

## 2022-11-09 NOTE — TOC Progression Note (Signed)
Transition of Care [facility]) - Progression Note    Patient Details  Name: Jason Lyons MRN: [id number] Date of Birth: 1956-06-26  [facility] of [facility]) CM/SW Contact  Leeroy Cha, RN Phone Number: 11/09/2022, 7:59 AM  Clinical Narrative:     [phone] tct-son message left on voice mail for sob tio please return call asap.  Expected Discharge Plan: [place] (TBD) Barriers to Discharge: Continued Medical Work up  Expected Discharge Plan and Services   Discharge Planning Services: CM Consult   Living arrangements for the past 2 months: Single Family Home                                       Social Determinants of Health (SDOH) Interventions SDOH Screenings   Food Insecurity: No Food Insecurity (11/01/2022)  Housing: Low Risk  (11/01/2022)  Transportation Needs: No Transportation Needs (11/01/2022)  Utilities: Not At Risk (11/01/2022)  Tobacco Use: Medium Risk (10/30/2022)    Readmission Risk Interventions   Row Labels 11/04/2022   11:32 AM  Readmission Risk Prevention Plan   Section Header. No data exists in this row.   Transportation Screening   Complete  PCP or Specialist Appt within 3-5 Days   Complete  HRI or [place]   Complete  Social Work Consult for [place] Planning/Counseling   Complete  Palliative Care Screening   Complete  Medication Review Press photographer)   Complete

## 2022-11-09 NOTE — TOC Progression Note (Addendum)
Transition of Care [facility]) - Progression Note    Patient Details  Name: Linville Decarolis MRN: [id number] Date of Birth: [birth year]  Transition of Care [facility]) CM/SW Contact  Leeroy Cha, RN Phone Number: 11/09/2022, 9:36 AM  Clinical Narrative:    [facility]  Accepted N/A [address], [place] [address] [phone] --  [facility] Preferred SNF  Accepted N/A [address] [place] [address] [phone] --  [facility]  Accepted N/A [address], [place] [address] [phone] -- --  [facility] Preferred SNF  Accepted N/A [address] [place] [address] [phone] --  [facility]  Accepted N/A [address] [place] [address] [phone] --  [facility] Preferred SNF  Accepted N/A [address], [place] [address] [phone] --  [facility]  Accepted N/A [place], [place] [address] [phone] --  [facility]  Accepted N/A [address] [place] [place] [address] [phone] --  [facility]  Accepted N/A [address] Francella Sol[place] [address] [phone] --  [facility] Preferred SNF  Accepted N/A [address], [place] [address] [phone] --   Patient can speak english. List given to him to review with his son and the son is to call me back.  My name number and list of bed offers given to patient. 1004/[facility]ct[facility]. Plan is for the patient to go home with hospice.  She spoke to the son yesterday and the equipment needed is set up.  Nurse Practioner is working on [employer].  And family is working to get him on Medicaid and there caps program.  She will contact son and have him  call back. Expected Discharge Plan: [place] (TBD) Barriers to Discharge: Continued Medical Work up  Expected Discharge Plan and Services   Discharge Planning Services: CM Consult   Living arrangements for the past 2 months: Single Family Home                                       Social Determinants of Health (SDOH) Interventions SDOH Screenings   Food Insecurity: No Food Insecurity (11/01/2022)  Housing: Low Risk  (11/01/2022)  Transportation Needs: No Transportation Needs (11/01/2022)  Utilities: Not At Risk (11/01/2022)  Tobacco Use: Medium Risk (10/30/2022)    Readmission Risk Interventions   Row Labels 11/04/2022   11:32 AM  Readmission Risk Prevention Plan   Section Header. No data exists in this row.   Transportation Screening   Complete  PCP or Specialist Appt within 3-5 Days   Complete  HRI or [place]   Complete  Social Work Consult for [place] Planning/Counseling   Complete  Palliative Care Screening   Complete  Medication Review Press photographer)   Complete

## 2022-11-09 NOTE — Progress Notes (Signed)
[facility] PROGRESS NOTE    Progress Note  Jason Lyons  YNW:[id number] DOB: 1955-10-24 DOA: 10/30/2022 PCP: Pcp, No     Brief Narrative:   Jason Lyons is an 67 y.o. 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 urinary retention status post Foley catheter and thrombocytopenia with a history of PE on Eliquis [place] in with uncontrollable pain known to have a pathologic fracture of T4 status post radiation at [facility] on 09/30/2023 readmitted on 10/12/2022 at [facility] with right-sided chest pain radiating to her back found to have T6 spinal metastases as well as new scattered pulmonary nodules neurosurgery was consulted and did not feel the patient was in a Perative candidate was given Decadron and transferred to [place] to consider radiation therapy as palliative measures.  Started on radiation continue on Decadron and pain management.  Started having urinary retention Foley catheter inserted.  After completing his radiation therapy on 10/26/2022 was discharged to skilled on 10/28/2022 and completed his Decadron taper.  Patient is medically stable to be transferred to skilled nursing facility with hospice.  Assessment/Plan:   Hypotension: Multifactorial likely due to dehydration and probably UTI urine culture grew more than 100,000 colonies of E. coli. His catheter was exchanged. Steroid taper has been completed. Palliative care met with family and oncologist he was not a candidate for further treatment. Family decided to move towards hospice care, hospice care to [place] to met with family, they are not able to provide the care as he has no caregiver at home, he will probably need to go to skilled with hospice to residential hospice facility. Patient is stable to be transferred to residential hospice facility or skilled nursing facility with hospice.  E.  coli UTI: Completed his course of antibiotics.  Pulmonary embolism: Continue Eliquis.  Severe constipation: Continue MiraLAX p.o. twice daily and Senokot.  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 He completed palliative radiation during last hospitalization and started on Decadron, steroids were not available at skilled. Patient was not a candidate for inpatient rehab. Palliative Care has been managing his pain.  Metastatic cholangiocarcinoma diagnosed in 2022: Family to move towards hospice care, family to meet with hospice care of [place] Continue current pain management.  Acute urinary retention: Felt to be likely secondary to cord compression currently with Foley catheter and will follow-up with urology if needed as an outpatient.  Anterior chest/scapular pain: Cardiac workup negative continue PPI and GI cocktail.  Moderate malnutrition in the context of chronic illness: Noted.  Hyponatremia: Likely due to hypovolemia resolved with hydration.  Thrombocytopenia: Likely due to chronic illness his platelets have remained relatively stable.  Electrolyte imbalance hypokalemia/hypophosphatemia: Try to keep potassium greater than 4 and phosphorus greater than 3. Repleted orally phosphorus is improved, potassium was repleted orally.  Significant results are present on admission stage II. RN Pressure Injury Documentation: Pressure Injury 10/31/22 Buttocks Right Stage 2 -  Partial thickness loss of dermis presenting as a shallow open injury with a red, pink wound bed without slough. (Active)  10/31/22 0519  Location: Buttocks  Location Orientation: Right  Staging: Stage 2 -  Partial thickness loss of dermis presenting as a shallow open injury with a red, pink wound bed without slough.  Wound Description  (Comments):  Present on Admission: Yes  Dressing Type Foam - Lift dressing to assess site every shift 11/09/22 0406   DVT prophylaxis: lovenox Family Communication:none Status is: Inpatient Remains inpatient appropriate because: Uncontrollable pain    Code Status:     Code Status Orders  (From admission, onward)           Start     Ordered   10/30/22 2119  Full code  Continuous       Question:  By:  Answer:  Consent: discussion documented in EHR   10/30/22 2120           Code Status History     Date Active Date Inactive Code Status Order ID Comments User Context   10/13/2022 0536 10/29/2022 1450 Full Code [id number]  [name], MD ED   05/13/2021 2330 05/19/2021 2025 Full Code [id number]  Rise Patience, MD Inpatient      Advance Directive Documentation    Flowsheet Row Most Recent Value  Type of Advance Directive Healthcare Power of Attorney  Pre-existing out of facility DNR order (yellow form or pink MOST form) --  "MOST" Form in Place? --         IV Access:   Peripheral IV   Procedures and diagnostic studies:   No results found.   Medical Consultants:   None.   Subjective:    Jason Lyons pain not controlled  Objective:    Vitals:   11/08/22 1358 11/08/22 1400 11/08/22 2002 11/09/22 0352  BP: 90/63 (!) '89/62 97/62 92/67 '$  Pulse: 79  73 85  Resp: '20  18 19  '$ Temp: 98.9 F (37.2 C)  98 F (36.7 C) 98.9 F (37.2 C)  TempSrc: Oral  Oral   SpO2: 99%  97% 98%  Weight:    67.2 kg  Height:       SpO2: 98 % FiO2 (%): 21 %   Intake/Output Summary (Last 24 hours) at 11/09/2022 0923 Last data filed at 11/09/2022 0417 Gross per 24 hour  Intake 120 ml  Output 1000 ml  Net -880 ml    Filed Weights   11/02/22 0412 11/08/22 0402 11/09/22 0352  Weight: 69 kg 68.9 kg 67.2 kg    Exam: General exam: In no acute distress. Respiratory system: Good air movement and clear to auscultation. Cardiovascular system: S1 & S2 heard, RRR. No  JVD. Gastrointestinal system: Abdomen is nondistended, soft and nontender.  Extremities: No pedal edema. Skin: No rashes, lesions or ulcers Psychiatry: Judgement and insight appear normal. Mood & affect appropriate.    Data Reviewed:    Labs: Basic Metabolic Panel: Recent Labs  Lab 11/02/22 1010 11/03/22 0354 11/04/22 0243  NA 137 140 142  K 3.2* 3.7 3.4*  CL 107 105 104  CO2 '25 27 29  '$ GLUCOSE 175* 201* 130*  BUN '19 18 19  '$ CREATININE 0.51* 0.40* 0.44*  CALCIUM 7.8* 7.9* 7.9*  MG 2.1 2.2  --   PHOS 1.8* 1.7* 2.5    GFR Estimated Creatinine Clearance: 76.9 mL/min (A) (by C-G formula based on SCr of 0.44 mg/dL (L)). Liver Function Tests: Recent Labs  Lab 11/02/22 1010 11/03/22 0354 11/04/22 0243  ALBUMIN 2.3* 2.2* 2.3*    No results for input(s): "LIPASE", "AMYLASE" in the last 168 hours. No results for input(s): "AMMONIA" in the last 168 hours. Coagulation profile No results for input(s): "INR", "PROTIME" in the last 168 hours.  COVID-19 Labs  No results for input(s): "DDIMER", "FERRITIN", "LDH", "CRP" in the last  72 hours.  Lab Results  Component Value Date   [place] NEGATIVE 05/13/2021    CBC: Recent Labs  Lab 11/02/22 1010 11/03/22 0354  WBC 6.7 6.2  HGB 12.1* 11.9*  HCT 36.4* 34.7*  MCV 98.4 96.9  PLT 56* 51*    Cardiac Enzymes: No results for input(s): "CKTOTAL", "CKMB", "CKMBINDEX", "TROPONINI" in the last 168 hours.  BNP (last 3 results) No results for input(s): "PROBNP" in the last 8760 hours. CBG: Recent Labs  Lab 11/05/22 2024  GLUCAP 101*    D-Dimer: No results for input(s): "DDIMER" in the last 72 hours. Hgb A1c: No results for input(s): "HGBA1C" in the last 72 hours. Lipid Profile: No results for input(s): "CHOL", "HDL", "LDLCALC", "TRIG", "CHOLHDL", "LDLDIRECT" in the last 72 hours. Thyroid function studies: No results for input(s): "TSH", "T4TOTAL", "T3FREE", "THYROIDAB" in the last 72 hours.  Invalid input(s):  "FREET3" Anemia work up: No results for input(s): "VITAMINB12", "FOLATE", "FERRITIN", "TIBC", "IRON", "RETICCTPCT" in the last 72 hours. Sepsis Labs: Recent Labs  Lab 11/02/22 1010 11/03/22 0354  WBC 6.7 6.2    Microbiology Recent Results (from the past 240 hour(s))  Urine Culture     Status: Abnormal   Collection Time: 10/30/22  8:30 PM   Specimen: Urine, Catheterized  Result Value Ref Range Status   Specimen Description   Final    URINE, CATHETERIZED Performed at [facility] [address] [place], [place] [address]    Special Requests   Final    NONE Performed at [facility], [place] [address], [place], [place] [address]    Culture >=100,000 COLONIES/mL ESCHERICHIA COLI (A)  Final   Report Status 11/02/2022 FINAL  Final   Organism ID, Bacteria ESCHERICHIA COLI (A)  Final      Susceptibility   Escherichia coli - MIC*    AMPICILLIN >=32 RESISTANT Resistant     CEFAZOLIN <=4 SENSITIVE Sensitive     CEFEPIME <=0.12 SENSITIVE Sensitive     CEFTRIAXONE <=0.25 SENSITIVE Sensitive     CIPROFLOXACIN <=0.25 SENSITIVE Sensitive     GENTAMICIN <=1 SENSITIVE Sensitive     IMIPENEM <=0.25 SENSITIVE Sensitive     NITROFURANTOIN <=16 SENSITIVE Sensitive     TRIMETH/SULFA <=20 SENSITIVE Sensitive     AMPICILLIN/SULBACTAM 16 INTERMEDIATE Intermediate     PIP/TAZO <=4 SENSITIVE Sensitive     * >=100,000 COLONIES/mL ESCHERICHIA COLI  Culture, blood (routine x 2)     Status: None   Collection Time: 10/30/22  9:02 PM   Specimen: BLOOD  Result Value Ref Range Status   Specimen Description   Final    BLOOD Performed at [facility] [address], [place], [place] [address]    Special Requests   Final    BOTTLES DRAWN AEROBIC AND ANAEROBIC Blood Culture adequate volume Performed at [facility] [address] [place], [place] [address]    Culture   Final    NO GROWTH 5 DAYS Performed at [facility], [place] [address] [place], [place] [address]    Report Status 11/05/2022 FINAL  Final  Culture, blood (routine x 2)     Status: None   Collection Time: 10/31/22  7:02 AM   Specimen: BLOOD RIGHT HAND  Result Value Ref Range Status   Specimen Description   Final    BLOOD RIGHT HAND Performed at [facility], [place] [address], [place], [place] [address]    Special Requests  Final    AEROBIC BOTTLE ONLY Blood Culture adequate volume Performed at [facility] [address] [place], [place] [address]    Culture   Final    NO GROWTH 5 DAYS Performed at [facility], [place] [address], [place], [place] [address]    Report Status 11/05/2022 FINAL  Final  MRSA Next Gen by PCR, Nasal     Status: None   Collection Time: 11/01/22  1:03 PM   Specimen: Nasal Mucosa; Nasal Swab  Result Value Ref Range Status   MRSA by PCR Next Gen NOT DETECTED NOT DETECTED Final    Comment: (NOTE) The GeneXpert MRSA Assay (FDA approved for NASAL specimens only), is one component of a comprehensive MRSA colonization surveillance program. It is not intended to diagnose MRSA infection nor to guide or monitor treatment for MRSA infections. Test performance is not FDA approved in patients less than 98 years old. Performed at [facility], [place] [address], [place], [place] [address]      Medications:    acetaminophen  1,000 mg Oral TID   apixaban  5 mg Oral BID   Chlorhexidine Gluconate Cloth  6 each Topical Daily   feeding supplement  237 mL Oral TID BM   oxyCODONE  40 mg Oral Q12H   pantoprazole  40 mg Oral BID AC   senna-docusate  1 tablet Oral BID   Continuous Infusions:  methocarbamol (ROBAXIN) IV        LOS: 9 days   Charlynne Cousins  [facility]  11/09/2022, 9:23 AM

## 2022-11-09 NOTE — Discharge Summary (Addendum)
Physician Discharge Summary  Jason Lyons UDJ:[id number] DOB: 1956/02/25 DOA: 10/30/2022  PCP: Pcp, No  Admit date: 10/30/2022 Discharge date: 11/09/2022  Admitted From: Home Disposition:  home  Recommendations for Outpatient Follow-up:  He will go home with hospice.  Home Health:No Equipment/Devices:one  Discharge Condition:Hospice CODE STATUS:DNR Diet recommendation: Heart Healthy   Brief/Interim Summary: 67 y.o. 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 urinary retention status post Foley catheter and thrombocytopenia with a history of PE on Eliquis Comes in with uncontrollable pain known to have a pathologic fracture of T4 status post radiation at [facility] on 09/30/2023 readmitted on 10/12/2022 at [facility] with right-sided chest pain radiating to her back found to have T6 spinal metastases as well as new scattered pulmonary nodules neurosurgery was consulted and did not feel the patient was in a Perative candidate was given Decadron and transferred to [place] to consider radiation therapy as palliative measures.  Started on radiation continue on Decadron and pain management.  Started having urinary retention Foley catheter inserted.  After completing his radiation therapy on 10/26/2022 was discharged to skilled on 10/28/2022 and completed his Decadron taper.  Discharge Diagnoses:  Principal Problem:   Cancer of biliary tract ([place]) Active Problems:   Pulmonary embolism (HCC)   Elevated LFTs   Other constipation   Paraplegia (HCC)   Malnutrition of moderate degree   Acute urinary retention   Leukocytosis   Hypotension   Pyuria   Hyponatremia   Thrombocytopenia (HCC)   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)   Urinary tract infection associated with indwelling urethral catheter (HCC)   Intractable pain   Pressure injury of  skin  Hypotension: Likely multifactorial due to dehydration probably UTI grew more than 100,000 colonies of E. coli he completed his course in-house. His catheter was exchanged. He completed steroid taper. Palliative care met with family and oncology as he is not a candidate for further treatment. Family decided to move towards hospice care with home hospice care. Sepsis was ruled out  E. coli UTI: Completed his course of antibiotics in house.  Pulmonary embolism: Continue Eliquis.  Severe constipation: Continue MiraLAX p.o. twice daily. Resolved.  Paraplegia and cord compression at T6 with encroachment of the thecal sac: During prior hospitalization patient was seen by neurosurgeon who felt not to be a surgical candidate. He completed his radiation treatment without any improvements. He completed his Decadron taper in house. He is not a candidate for inpatient rehab. Perative care was consulted his pain is controlled with current regimen he could be discharged in stable condition.  Metastatic cholangiocarcinoma diagnosed in 2022: Family after discussing with palliative care and [facility] oncologist decided to move towards hospice.  Acute urinary retention: He will go home with a Foley catheter.  Anterior chest pain/scapular pain: No further cardiac workup needed.  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  Hypovolemic hyponatremia resolved with IV fluids.  Chronic thrombocytopenia: Likely due to chronic illness they have remained stable.  Electrolyte imbalance hypokalemia/hypophosphatemia: They were repleted we will try to keep potassium greater than 4 and phosphorus greater than 3.  Sacral decubitus ulcer stage II present on admission:     Discharge Instructions  Discharge Instructions     Diet - low sodium heart healthy   Complete by: As directed    Increase activity slowly   Complete by: As directed    No wound  care    Complete by: As directed       Allergies as of 11/09/2022   No Known Allergies      Medication List     STOP taking these medications    cholecalciferol 25 MCG (1000 UNIT) tablet Commonly known as: VITAMIN D3   dexamethasone 2 MG tablet Commonly known as: DECADRON       TAKE these medications    apixaban 5 MG Tabs tablet Commonly known as: ELIQUIS U?ng 1 vin 2 l?n m?i ngy. (Take 1 tablet by mouth 2 times daily.)   feeding supplement Liqd Take 237 mLs by mouth 3 (three) times daily between meals.   HYDROmorphone 2 MG tablet Commonly known as: Dilaudid Take 0.5 tablets (1 mg total) by mouth every 6 (six) hours as needed for up to 3 days for severe pain.   oxyCODONE 40 mg 12 hr tablet Commonly known as: OXYCONTIN Take 1 tablet (40 mg total) by mouth every 12 (twelve) hours for 7 days.   pantoprazole 40 MG tablet Commonly known as: PROTONIX Take 1 tablet (40 mg total) by mouth daily.   polyethylene glycol 17 g packet Commonly known as: MIRALAX / GLYCOLAX Take 17 g by mouth 2 (two) times daily.   senna-docusate 8.6-50 MG tablet Commonly known as: Senokot-S Take 1 tablet by mouth 2 (two) times daily.        No Known Allergies  Consultations: PMT   Procedures/Studies: DG Abd 1 View  Result Date: 10/30/2022 CLINICAL DATA:  Metastatic cholangiocarcinoma, history pulmonary embolism on Eliquis, obstipation new paraplegia secondary to cord compression at T6 EXAM: ABDOMEN - 1 VIEW COMPARISON:  Portable exam 2124 hours without priors for comparison FINDINGS: Increased stool throughout colon to rectum. No bowel dilatation or obstruction. Small prostatic calcifications. Bones demineralized. IMPRESSION: Increased stool throughout colon to rectum. Electronically Signed   By: Lavonia Dana M.D.   On: 10/30/2022 21:34   DG Chest Portable 1 View  Result Date: 10/30/2022 CLINICAL DATA:  Hypotension EXAM: PORTABLE CHEST 1 VIEW COMPARISON:  Portable exam 2042 hours  compared to 10/12/2022 FINDINGS: RIGHT jugular Port-A-Cath with tip projecting over SVC. Normal heart size, mediastinal contours, and pulmonary vascularity. Minimal bibasilar atelectasis. Lungs otherwise clear. No pulmonary infiltrate, pleural effusion, or pneumothorax. No acute osseous findings. IMPRESSION: Minimal bibasilar atelectasis. Electronically Signed   By: Lavonia Dana M.D.   On: 10/30/2022 20:50   US Abdomen Limited RUQ (LIVER/GB)  Result Date: 10/21/2022 CLINICAL DATA:  Abnormal LFTs EXAM: ULTRASOUND ABDOMEN LIMITED RIGHT UPPER QUADRANT COMPARISON:  CT 10/26/2021 FINDINGS: Gallbladder: Not visualized.  Reported prior cholecystectomy. Common bile duct: Common bile duct is not visualized. Liver: There are heterogeneous nodular lesions with indistinct borders, measuring 3.4 x 2.0 x 3.6 cm and 2.9 x 1.9 x 2.7 cm. These likely correspond to the measured lesions on recent chest CT. Portal vein is patent on color Doppler imaging with normal direction of blood flow towards the liver. Other: None. IMPRESSION: Heterogeneous nodular lesions in the liver measuring up to 3.6 cm and 2.9 cm, suspicious for metastatic disease, as seen on recent chest CT. Gallbladder is not visualized.  Reported prior cholecystectomy. Common bile duct is not visualized. Electronically Signed   By: Maurine Simmering M.D.   On: 10/21/2022 08:11   MR THORACIC SPINE W WO CONTRAST  Result Date: 10/13/2022 CLINICAL DATA:  Osseous metastatic disease EXAM: MRI CERVICAL, THORACIC AND LUMBAR SPINE WITHOUT AND WITH CONTRAST TECHNIQUE: Multiplanar and multiecho pulse sequences of the cervical spine,  to include the craniocervical junction and cervicothoracic junction, and thoracic and lumbar spine, were obtained without and with intravenous contrast. CONTRAST:  31m GADAVIST GADOBUTROL 1 MMOL/ML IV SOLN COMPARISON:  None available FINDINGS: MRI CERVICAL SPINE FINDINGS Alignment: Normal Vertebrae: No fracture, evidence of discitis, or bone lesion.  Cord: Normal signal and morphology. Posterior Fossa, vertebral arteries, paraspinal tissues: Negative Disc levels: C1-2: Unremarkable. C2-3: Small central disc protrusion. There is no spinal canal stenosis. No neural foraminal stenosis. C3-4: Normal disc space and facet joints. There is no spinal canal stenosis. No neural foraminal stenosis. C4-5: Normal disc space and facet joints. There is no spinal canal stenosis. No neural foraminal stenosis. C5-6: Small disc bulge. There is no spinal canal stenosis. Mild left neural foraminal stenosis. C6-7: Normal disc space and facet joints. There is no spinal canal stenosis. No neural foraminal stenosis. C7-T1: Normal disc space and facet joints. There is no spinal canal stenosis. No neural foraminal stenosis. MRI THORACIC SPINE FINDINGS Alignment:  Physiologic. Vertebrae: There are multiple expansile, contrast-enhancing masses. There are circumferential lesions at T2 and T3 that encroach on the spinal canal, particularly at right T3. There is a similar lesion of T6 that also encroaches on the spinal canal and causes severe stenosis of the thecal sac. Mild T4 compression deformity. Edema at T4 has resolved. Cord:  Normal signal and morphology. Paraspinal and other soft tissues: Pulmonary nodules are better assessed on the earlier CT. Disc levels: The thecal sac is effaced at the T2, T3 and T6 levels. There is moderate mass effect on the spinal cord at the T6 level. MRI LUMBAR SPINE FINDINGS Segmentation:  Standard. Alignment:  Normal Vertebrae: No fracture, evidence of discitis, or bone lesion. L1 hemangioma. Conus medullaris and cauda equina: Conus extends to the L1 level. Conus and cauda equina appear normal. Paraspinal and other soft tissues: Negative Disc levels: L1-L2: Normal disc space and facet joints. No spinal canal stenosis. No neural foraminal stenosis. L2-L3: Normal disc space and facet joints. No spinal canal stenosis. No neural foraminal stenosis. L3-L4: Normal  disc space and facet joints. No spinal canal stenosis. No neural foraminal stenosis. L4-L5: Left asymmetric disc bulge with narrowing of left lateral recess and posterior displacement of the left L5 nerve root. No central spinal canal stenosis. No neural foraminal stenosis. L5-S1: Left asymmetric disc bulge with mild left lateral recess narrowing and posterior displacement of the left S1 nerve root. No spinal canal stenosis. No neural foraminal stenosis. Visualized sacrum: Normal. IMPRESSION: 1. Multiple expansile, contrast-enhancing masses of the upper thoracic spine with epidural extension causing encroachment on the thecal sac. Moderate mass effect on the spinal cord at the T6 level 2. No metastatic disease of the cervical or lumbar spine. 3. Left lateral recess narrowing at L4-5 and L5-S1 with posterior displacement of the left L5 and S1 nerve roots. 4. Resolution of edema at T4 without progression of height loss. Electronically Signed   By: KUlyses JarredM.D.   On: 10/13/2022 00:20   MR Lumbar Spine W Wo Contrast  Result Date: 10/13/2022 CLINICAL DATA:  Osseous metastatic disease EXAM: MRI CERVICAL, THORACIC AND LUMBAR SPINE WITHOUT AND WITH CONTRAST TECHNIQUE: Multiplanar and multiecho pulse sequences of the cervical spine, to include the craniocervical junction and cervicothoracic junction, and thoracic and lumbar spine, were obtained without and with intravenous contrast. CONTRAST:  652mGADAVIST GADOBUTROL 1 MMOL/ML IV SOLN COMPARISON:  None available FINDINGS: MRI CERVICAL SPINE FINDINGS Alignment: Normal Vertebrae: No fracture, evidence of discitis, or bone lesion.  Cord: Normal signal and morphology. Posterior Fossa, vertebral arteries, paraspinal tissues: Negative Disc levels: C1-2: Unremarkable. C2-3: Small central disc protrusion. There is no spinal canal stenosis. No neural foraminal stenosis. C3-4: Normal disc space and facet joints. There is no spinal canal stenosis. No neural foraminal stenosis.  C4-5: Normal disc space and facet joints. There is no spinal canal stenosis. No neural foraminal stenosis. C5-6: Small disc bulge. There is no spinal canal stenosis. Mild left neural foraminal stenosis. C6-7: Normal disc space and facet joints. There is no spinal canal stenosis. No neural foraminal stenosis. C7-T1: Normal disc space and facet joints. There is no spinal canal stenosis. No neural foraminal stenosis. MRI THORACIC SPINE FINDINGS Alignment:  Physiologic. Vertebrae: There are multiple expansile, contrast-enhancing masses. There are circumferential lesions at T2 and T3 that encroach on the spinal canal, particularly at right T3. There is a similar lesion of T6 that also encroaches on the spinal canal and causes severe stenosis of the thecal sac. Mild T4 compression deformity. Edema at T4 has resolved. Cord:  Normal signal and morphology. Paraspinal and other soft tissues: Pulmonary nodules are better assessed on the earlier CT. Disc levels: The thecal sac is effaced at the T2, T3 and T6 levels. There is moderate mass effect on the spinal cord at the T6 level. MRI LUMBAR SPINE FINDINGS Segmentation:  Standard. Alignment:  Normal Vertebrae: No fracture, evidence of discitis, or bone lesion. L1 hemangioma. Conus medullaris and cauda equina: Conus extends to the L1 level. Conus and cauda equina appear normal. Paraspinal and other soft tissues: Negative Disc levels: L1-L2: Normal disc space and facet joints. No spinal canal stenosis. No neural foraminal stenosis. L2-L3: Normal disc space and facet joints. No spinal canal stenosis. No neural foraminal stenosis. L3-L4: Normal disc space and facet joints. No spinal canal stenosis. No neural foraminal stenosis. L4-L5: Left asymmetric disc bulge with narrowing of left lateral recess and posterior displacement of the left L5 nerve root. No central spinal canal stenosis. No neural foraminal stenosis. L5-S1: Left asymmetric disc bulge with mild left lateral recess  narrowing and posterior displacement of the left S1 nerve root. No spinal canal stenosis. No neural foraminal stenosis. Visualized sacrum: Normal. IMPRESSION: 1. Multiple expansile, contrast-enhancing masses of the upper thoracic spine with epidural extension causing encroachment on the thecal sac. Moderate mass effect on the spinal cord at the T6 level 2. No metastatic disease of the cervical or lumbar spine. 3. Left lateral recess narrowing at L4-5 and L5-S1 with posterior displacement of the left L5 and S1 nerve roots. 4. Resolution of edema at T4 without progression of height loss. Electronically Signed   By: Ulyses Jarred M.D.   On: 10/13/2022 00:20   MR CERVICAL SPINE W WO CONTRAST  Result Date: 10/13/2022 CLINICAL DATA:  Osseous metastatic disease EXAM: MRI CERVICAL, THORACIC AND LUMBAR SPINE WITHOUT AND WITH CONTRAST TECHNIQUE: Multiplanar and multiecho pulse sequences of the cervical spine, to include the craniocervical junction and cervicothoracic junction, and thoracic and lumbar spine, were obtained without and with intravenous contrast. CONTRAST:  75m GADAVIST GADOBUTROL 1 MMOL/ML IV SOLN COMPARISON:  None available FINDINGS: MRI CERVICAL SPINE FINDINGS Alignment: Normal Vertebrae: No fracture, evidence of discitis, or bone lesion. Cord: Normal signal and morphology. Posterior Fossa, vertebral arteries, paraspinal tissues: Negative Disc levels: C1-2: Unremarkable. C2-3: Small central disc protrusion. There is no spinal canal stenosis. No neural foraminal stenosis. C3-4: Normal disc space and facet joints. There is no spinal canal stenosis. No neural foraminal stenosis. C4-5:  Normal disc space and facet joints. There is no spinal canal stenosis. No neural foraminal stenosis. C5-6: Small disc bulge. There is no spinal canal stenosis. Mild left neural foraminal stenosis. C6-7: Normal disc space and facet joints. There is no spinal canal stenosis. No neural foraminal stenosis. C7-T1: Normal disc space  and facet joints. There is no spinal canal stenosis. No neural foraminal stenosis. MRI THORACIC SPINE FINDINGS Alignment:  Physiologic. Vertebrae: There are multiple expansile, contrast-enhancing masses. There are circumferential lesions at T2 and T3 that encroach on the spinal canal, particularly at right T3. There is a similar lesion of T6 that also encroaches on the spinal canal and causes severe stenosis of the thecal sac. Mild T4 compression deformity. Edema at T4 has resolved. Cord:  Normal signal and morphology. Paraspinal and other soft tissues: Pulmonary nodules are better assessed on the earlier CT. Disc levels: The thecal sac is effaced at the T2, T3 and T6 levels. There is moderate mass effect on the spinal cord at the T6 level. MRI LUMBAR SPINE FINDINGS Segmentation:  Standard. Alignment:  Normal Vertebrae: No fracture, evidence of discitis, or bone lesion. L1 hemangioma. Conus medullaris and cauda equina: Conus extends to the L1 level. Conus and cauda equina appear normal. Paraspinal and other soft tissues: Negative Disc levels: L1-L2: Normal disc space and facet joints. No spinal canal stenosis. No neural foraminal stenosis. L2-L3: Normal disc space and facet joints. No spinal canal stenosis. No neural foraminal stenosis. L3-L4: Normal disc space and facet joints. No spinal canal stenosis. No neural foraminal stenosis. L4-L5: Left asymmetric disc bulge with narrowing of left lateral recess and posterior displacement of the left L5 nerve root. No central spinal canal stenosis. No neural foraminal stenosis. L5-S1: Left asymmetric disc bulge with mild left lateral recess narrowing and posterior displacement of the left S1 nerve root. No spinal canal stenosis. No neural foraminal stenosis. Visualized sacrum: Normal. IMPRESSION: 1. Multiple expansile, contrast-enhancing masses of the upper thoracic spine with epidural extension causing encroachment on the thecal sac. Moderate mass effect on the spinal cord  at the T6 level 2. No metastatic disease of the cervical or lumbar spine. 3. Left lateral recess narrowing at L4-5 and L5-S1 with posterior displacement of the left L5 and S1 nerve roots. 4. Resolution of edema at T4 without progression of height loss. Electronically Signed   By: Ulyses Jarred M.D.   On: 10/13/2022 00:20   CT Angio Chest PE W/Cm &/Or Wo Cm  Result Date: 10/12/2022 CLINICAL DATA:  History of cholangiocarcinoma with right-sided chest pain radiating into the back EXAM: CT ANGIOGRAPHY CHEST WITH CONTRAST TECHNIQUE: Multidetector CT imaging of the chest was performed using the standard protocol during bolus administration of intravenous contrast. Multiplanar CT image reconstructions and MIPs were obtained to evaluate the vascular anatomy. RADIATION DOSE REDUCTION: This exam was performed according to the departmental dose-optimization program which includes automated exposure control, adjustment of the mA and/or kV according to patient size and/or use of iterative reconstruction technique. CONTRAST:  48m OMNIPAQUE IOHEXOL 350 MG/ML SOLN COMPARISON:  CT chest dated 10/26/2021 FINDINGS: Cardiovascular: The study is high quality for the evaluation of pulmonary embolism. There are no filling defects in the central, lobar, segmental or subsegmental pulmonary artery branches to suggest acute pulmonary embolism. Ascending aorta measures 4.1 cm, unchanged. Right chest wall port terminates at the superior cavoatrial junction. Normal heart size. No significant pericardial fluid/thickening. Coronary artery calcifications and aortic atherosclerosis. Mediastinum/Nodes: Imaged thyroid gland without nodules meeting criteria for imaging  follow-up by size. Normal esophagus. Unchanged 10 mm right hilar lymph node (5:62). Lungs/Pleura: The central airways are patent. Scattered pulmonary nodules are new or slightly increased in size, for example left upper lobe 6 x 5 mm nodule (6:29), previously 2 mm, subpleural left  upper lobe nodule measuring 5 x 4 mm (6:67), previously 2 mm, and subpleural 4 x 4 mm right lower lobe nodule (6:60). New 2 mm subpleural left upper lobe ground-glass nodule (6:36) and 3 mm left lower lobe nodule (6:89). Unchanged left lower lobe 3 mm perifissural nodule (6:74). No focal consolidation. No pneumothorax. No pleural effusion. Upper abdomen: Increased atrophic appearance of the right hepatic lobe. Additional multifocal hypoattenuating foci measuring up to 3.4 cm in segment 2/3 (5:128) are new. Musculoskeletal: Lytic and sclerotic appearance of T2 and T6 and right transverse process of T3 are new. Similar compression deformity of T4 with increased lytic appearance. Review of the MIP images confirms the above findings. IMPRESSION: 1. No acute pulmonary embolism. 2. New and increased size of scattered pulmonary nodules, suspicious for metastatic disease. 3. New multifocal hypoattenuating foci in the liver, suspicious for metastatic disease. 4. New lytic and sclerotic appearance of T2 and T6 and right transverse process of T3, suspicious for osseous metastatic disease. Similar compression deformity of T4 with increased lytic appearance. 5. Coronary artery calcifications. Aortic Atherosclerosis (ICD10-I70.0). Electronically Signed   By: Darrin Nipper M.D.   On: 10/12/2022 14:21   DG Chest 2 View  Result Date: 10/12/2022 CLINICAL DATA:  Chest pain. EXAM: CHEST - 2 VIEW COMPARISON:  May 13, 2021. FINDINGS: The heart size and mediastinal contours are within normal limits. Both lungs are clear. Right internal jugular Port-A-Cath is noted with distal tip in expected position of the SVC. The visualized skeletal structures are unremarkable. IMPRESSION: No active cardiopulmonary disease. Electronically Signed   By: Marijo Conception M.D.   On: 10/12/2022 11:48     Subjective: No complains  Discharge Exam: Vitals:   11/08/22 2002 11/09/22 0352  BP: 97/62 92/67  Pulse: 73 85  Resp: 18 19  Temp: 98 F (36.7  C) 98.9 F (37.2 C)  SpO2: 97% 98%   Vitals:   11/08/22 1358 11/08/22 1400 11/08/22 2002 11/09/22 0352  BP: 90/63 (!) '89/62 97/62 92/67 '$  Pulse: 79  73 85  Resp: '20  18 19  '$ Temp: 98.9 F (37.2 C)  98 F (36.7 C) 98.9 F (37.2 C)  TempSrc: Oral  Oral   SpO2: 99%  97% 98%  Weight:    67.2 kg  Height:        General: Pt is alert, awake, not in acute distress Cardiovascular: RRR, S1/S2 +, no rubs, no gallops Respiratory: CTA bilaterally, no wheezing, no rhonchi Abdominal: Soft, NT, ND, bowel sounds + Extremities: no edema, no cyanosis    The results of significant diagnostics from this hospitalization (including imaging, microbiology, ancillary and laboratory) are listed below for reference.     Microbiology: Recent Results (from the past 240 hour(s))  Urine Culture     Status: Abnormal   Collection Time: 10/30/22  8:30 PM   Specimen: Urine, Catheterized  Result Value Ref Range Status   Specimen Description   Final    URINE, CATHETERIZED Performed at [facility] [address], [place], [place] [address]    Special Requests   Final    NONE Performed at [facility], [place] [address] [place], [place] [address]    Culture >=100,000 COLONIES/mL  ESCHERICHIA COLI (A)  Final   Report Status 11/02/2022 FINAL  Final   Organism ID, Bacteria ESCHERICHIA COLI (A)  Final      Susceptibility   Escherichia coli - MIC*    AMPICILLIN >=32 RESISTANT Resistant     CEFAZOLIN <=4 SENSITIVE Sensitive     CEFEPIME <=0.12 SENSITIVE Sensitive     CEFTRIAXONE <=0.25 SENSITIVE Sensitive     CIPROFLOXACIN <=0.25 SENSITIVE Sensitive     GENTAMICIN <=1 SENSITIVE Sensitive     IMIPENEM <=0.25 SENSITIVE Sensitive     NITROFURANTOIN <=16 SENSITIVE Sensitive     TRIMETH/SULFA <=20 SENSITIVE Sensitive     AMPICILLIN/SULBACTAM 16 INTERMEDIATE Intermediate     PIP/TAZO <=4 SENSITIVE Sensitive     * >=100,000 COLONIES/mL ESCHERICHIA COLI  Culture, blood  (routine x 2)     Status: None   Collection Time: 10/30/22  9:02 PM   Specimen: BLOOD  Result Value Ref Range Status   Specimen Description   Final    BLOOD Performed at [facility] [address], [place], [place] [address]    Special Requests   Final    BOTTLES DRAWN AEROBIC AND ANAEROBIC Blood Culture adequate volume Performed at [facility] [address] [place], [place] [address]    Culture   Final    NO GROWTH 5 DAYS Performed at [facility], [place] [address] [place], [place] [address]    Report Status 11/05/2022 FINAL  Final  Culture, blood (routine x 2)     Status: None   Collection Time: 10/31/22  7:02 AM   Specimen: BLOOD RIGHT HAND  Result Value Ref Range Status   Specimen Description   Final    BLOOD RIGHT HAND Performed at [facility], [place] [address] [place], [place] [address]    Special Requests   Final    AEROBIC BOTTLE ONLY Blood Culture adequate volume Performed at [facility] [address], [place], [place] [address]    Culture   Final    NO GROWTH 5 DAYS Performed at [facility], [place] [address] [place], [place] [address]    Report Status 11/05/2022 FINAL  Final  MRSA Next Gen by PCR, Nasal     Status: None   Collection Time: 11/01/22  1:03 PM   Specimen: Nasal Mucosa; Nasal Swab  Result Value Ref Range Status   MRSA by PCR Next Gen NOT DETECTED NOT DETECTED Final    Comment: (NOTE) The GeneXpert MRSA Assay (FDA approved for NASAL specimens only), is one component of a comprehensive MRSA colonization surveillance program. It is not intended to diagnose MRSA infection nor to guide or monitor treatment for MRSA infections. Test performance is not FDA approved in patients less than 58 years old. Performed at [facility], [place] [address], [place], [place] [address]      Labs: BNP (last 3 results) No results for input(s): "BNP" in the last 8760  hours. Basic Metabolic Panel: Recent Labs  Lab 11/03/22 0354 11/04/22 0243  NA 140 142  K 3.7 3.4*  CL 105 104  CO2 27 29  GLUCOSE 201* 130*  BUN 18 19  CREATININE 0.40* 0.44*  CALCIUM 7.9* 7.9*  MG 2.2  --   PHOS 1.7* 2.5   Liver Function Tests: Recent Labs  Lab 11/03/22 0354 11/04/22 0243  ALBUMIN 2.2* 2.3*   No results for input(s): "LIPASE", "AMYLASE" in the last 168 hours. No results  for input(s): "AMMONIA" in the last 168 hours. CBC: Recent Labs  Lab 11/03/22 0354  WBC 6.2  HGB 11.9*  HCT 34.7*  MCV 96.9  PLT 51*   Cardiac Enzymes: No results for input(s): "CKTOTAL", "CKMB", "CKMBINDEX", "TROPONINI" in the last 168 hours. BNP: Invalid input(s): "POCBNP" CBG: Recent Labs  Lab 11/05/22 2024  GLUCAP 101*   D-Dimer No results for input(s): "DDIMER" in the last 72 hours. Hgb A1c No results for input(s): "HGBA1C" in the last 72 hours. Lipid Profile No results for input(s): "CHOL", "HDL", "LDLCALC", "TRIG", "CHOLHDL", "LDLDIRECT" in the last 72 hours. Thyroid function studies No results for input(s): "TSH", "T4TOTAL", "T3FREE", "THYROIDAB" in the last 72 hours.  Invalid input(s): "FREET3" Anemia work up No results for input(s): "VITAMINB12", "FOLATE", "FERRITIN", "TIBC", "IRON", "RETICCTPCT" in the last 72 hours. Urinalysis    Component Value Date/Time   COLORURINE AMBER (A) 10/30/2022 1933   APPEARANCEUR CLOUDY (A) 10/30/2022 1933   LABSPEC 1.019 10/30/2022 1933   PHURINE 5.0 10/30/2022 1933   GLUCOSEU NEGATIVE 10/30/2022 1933   HGBUR LARGE (A) 10/30/2022 1933   BILIRUBINUR NEGATIVE 10/30/2022 [place] NEGATIVE 10/30/2022 1933   PROTEINUR 30 (A) 10/30/2022 1933   NITRITE NEGATIVE 10/30/2022 1933   LEUKOCYTESUR LARGE (A) 10/30/2022 1933   Sepsis Labs Recent Labs  Lab 11/03/22 0354  WBC 6.2   Microbiology Recent Results (from the past 240 hour(s))  Urine Culture     Status: Abnormal   Collection Time: 10/30/22  8:30 PM    Specimen: Urine, Catheterized  Result Value Ref Range Status   Specimen Description   Final    URINE, CATHETERIZED Performed at [facility], [place] [address] [place], [place] [address]    Special Requests   Final    NONE Performed at [facility], [place] [address] [place], [place] [address]    Culture >=100,000 COLONIES/mL ESCHERICHIA COLI (A)  Final   Report Status 11/02/2022 FINAL  Final   Organism ID, Bacteria ESCHERICHIA COLI (A)  Final      Susceptibility   Escherichia coli - MIC*    AMPICILLIN >=32 RESISTANT Resistant     CEFAZOLIN <=4 SENSITIVE Sensitive     CEFEPIME <=0.12 SENSITIVE Sensitive     CEFTRIAXONE <=0.25 SENSITIVE Sensitive     CIPROFLOXACIN <=0.25 SENSITIVE Sensitive     GENTAMICIN <=1 SENSITIVE Sensitive     IMIPENEM <=0.25 SENSITIVE Sensitive     NITROFURANTOIN <=16 SENSITIVE Sensitive     TRIMETH/SULFA <=20 SENSITIVE Sensitive     AMPICILLIN/SULBACTAM 16 INTERMEDIATE Intermediate     PIP/TAZO <=4 SENSITIVE Sensitive     * >=100,000 COLONIES/mL ESCHERICHIA COLI  Culture, blood (routine x 2)     Status: None   Collection Time: 10/30/22  9:02 PM   Specimen: BLOOD  Result Value Ref Range Status   Specimen Description   Final    BLOOD Performed at [place] [address] [place], [place] [address]    Special Requests   Final    BOTTLES DRAWN AEROBIC AND ANAEROBIC Blood Culture adequate volume Performed at [place] [address] [place], [place] [address]    Culture   Final    NO GROWTH 5 DAYS Performed at [facility], [place] [address] [place], [place] [address]    Report Status 11/05/2022 FINAL  Final  Culture, blood (routine x 2)     Status: None   Collection Time: 10/31/22  7:02  AM   Specimen: BLOOD RIGHT HAND  Result Value Ref Range Status   Specimen Description   Final    BLOOD RIGHT HAND Performed at [facility], [place] [address],  [place], [place] [address]    Special Requests   Final    AEROBIC BOTTLE ONLY Blood Culture adequate volume Performed at [place] [address] [place], [place] [address]    Culture   Final    NO GROWTH 5 DAYS Performed at [facility], [place] [address], [place], [place] [address]    Report Status 11/05/2022 FINAL  Final  MRSA Next Gen by PCR, Nasal     Status: None   Collection Time: 11/01/22  1:03 PM   Specimen: Nasal Mucosa; Nasal Swab  Result Value Ref Range Status   MRSA by PCR Next Gen NOT DETECTED NOT DETECTED Final    Comment: (NOTE) The GeneXpert MRSA Assay (FDA approved for NASAL specimens only), is one component of a comprehensive MRSA colonization surveillance program. It is not intended to diagnose MRSA infection nor to guide or monitor treatment for MRSA infections. Test performance is not FDA approved in patients less than 72 years old. Performed at [facility], [place] [address] [place],  [address]     SIGNED:   Charlynne Cousins, MD  [facility] 11/09/2022, 10:17 AM Pager   If 7PM-7AM, please contact night-coverage [email or web address] Password [email or web address]

## 2022-11-09 NOTE — TOC Transition Note (Addendum)
Transition of Care [facility]) - CM/SW Discharge Note   Patient Details  Name: Jason Lyons MRN: [id number] Date of Birth: 1956/06/02  Transition of Care [facility]) CM/SW Contact:  Leeroy Cha, RN Phone Number: 11/09/2022, 10:20 AM   Clinical Narrative:    Patient transitioned to home with home hospice through [facility].  Spoke with the son equipment is in place and they are ready to receive patient. Tct-Cheri Merrilyn Puma RN will be out to the house around 1400-1430 today.  Will go ahead and call ptar at 1030 for transport home. [facility] CALLED FOR TRANSPORT AT 1030. Final next level of care: Home w Hospice Care Barriers to Discharge: Barriers Resolved   Patient Goals and CMS Choice CMS Medicare.gov Compare Post Acute Care list provided to:: Patient Choice offered to / list presented to : Patient  Discharge Placement                         Discharge Plan and Services Additional resources added to the After Visit Summary for     Discharge Planning Services: CM Consult                                 Social Determinants of Health (SDOH) Interventions SDOH Screenings   Food Insecurity: No Food Insecurity (11/01/2022)  Housing: Low Risk  (11/01/2022)  Transportation Needs: No Transportation Needs (11/01/2022)  Utilities: Not At Risk (11/01/2022)  Tobacco Use: Medium Risk (10/30/2022)     Readmission Risk Interventions   Row Labels 11/04/2022   11:32 AM  Readmission Risk Prevention Plan   Section Header. No data exists in this row.   Transportation Screening   Complete  PCP or Specialist Appt within 3-5 Days   Complete  HRI or Miguel Barrera   Complete  Social Work Consult for [place] Planning/Counseling   Complete  Palliative Care Screening   Complete  Medication Review Press photographer)   Complete

## 2022-12-13 ENCOUNTER — Ambulatory Visit
Admit: 2022-12-13 | Discharge: 2022-12-13 | Disposition: A | Discharge status: Home / Self Care | Payer: Medicare Other | Attending: Radiation Oncology | Admitting: Radiation Oncology

## 2022-12-13 NOTE — Progress Notes (Signed)
  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 Detailed voicemail left using [employer] [phone] Verner Chol).  The patient is scheduled for ongoing care with Dr. Nelda Marseille at [facility] in medical oncology. The patient was encouraged to call if he develops concerns or questions regarding radiation.   Leandra Kern, LPN

## 2023-01-10 DEATH — deceased
